# Patient Record
Sex: Female | Born: 1955 | Race: White | Hispanic: No | Marital: Married | Smoking: Never smoker
Health system: Southern US, Community
[De-identification: ages and names within clinical notes are randomized; demographics above are authoritative.]

## PROBLEM LIST (undated history)

## (undated) DIAGNOSIS — Z9889 Other specified postprocedural states: Secondary | ICD-10-CM

## (undated) DIAGNOSIS — K5790 Diverticulosis of intestine, part unspecified, without perforation or abscess without bleeding: Secondary | ICD-10-CM

## (undated) DIAGNOSIS — K219 Gastro-esophageal reflux disease without esophagitis: Secondary | ICD-10-CM

## (undated) DIAGNOSIS — N6019 Diffuse cystic mastopathy of unspecified breast: Secondary | ICD-10-CM

## (undated) DIAGNOSIS — F329 Major depressive disorder, single episode, unspecified: Secondary | ICD-10-CM

## (undated) DIAGNOSIS — K449 Diaphragmatic hernia without obstruction or gangrene: Secondary | ICD-10-CM

## (undated) DIAGNOSIS — G8929 Other chronic pain: Secondary | ICD-10-CM

## (undated) DIAGNOSIS — R109 Unspecified abdominal pain: Secondary | ICD-10-CM

## (undated) DIAGNOSIS — R51 Headache: Secondary | ICD-10-CM

## (undated) DIAGNOSIS — N809 Endometriosis, unspecified: Secondary | ICD-10-CM

## (undated) DIAGNOSIS — F1220 Cannabis dependence, uncomplicated: Secondary | ICD-10-CM

## (undated) DIAGNOSIS — I639 Cerebral infarction, unspecified: Secondary | ICD-10-CM

## (undated) DIAGNOSIS — R1115 Cyclical vomiting syndrome unrelated to migraine: Secondary | ICD-10-CM

## (undated) DIAGNOSIS — K589 Irritable bowel syndrome without diarrhea: Secondary | ICD-10-CM

## (undated) DIAGNOSIS — Z8719 Personal history of other diseases of the digestive system: Secondary | ICD-10-CM

## (undated) DIAGNOSIS — F32A Depression, unspecified: Secondary | ICD-10-CM

## (undated) DIAGNOSIS — F319 Bipolar disorder, unspecified: Secondary | ICD-10-CM

## (undated) DIAGNOSIS — R519 Headache, unspecified: Secondary | ICD-10-CM

## (undated) DIAGNOSIS — K635 Polyp of colon: Secondary | ICD-10-CM

## (undated) DIAGNOSIS — T7840XA Allergy, unspecified, initial encounter: Secondary | ICD-10-CM

## (undated) HISTORY — DX: Headache: R51

## (undated) HISTORY — PX: TOE SURGERY: SHX1073

## (undated) HISTORY — DX: Diffuse cystic mastopathy of unspecified breast: N60.19

## (undated) HISTORY — DX: Diverticulosis of intestine, part unspecified, without perforation or abscess without bleeding: K57.90

## (undated) HISTORY — DX: Polyp of colon: K63.5

## (undated) HISTORY — DX: Other chronic pain: G89.29

## (undated) HISTORY — DX: Allergy, unspecified, initial encounter: T78.40XA

## (undated) HISTORY — DX: Depression, unspecified: F32.A

## (undated) HISTORY — DX: Endometriosis, unspecified: N80.9

## (undated) HISTORY — PX: FOOT FRACTURE SURGERY: SHX645

## (undated) HISTORY — DX: Major depressive disorder, single episode, unspecified: F32.9

## (undated) HISTORY — DX: Cerebral infarction, unspecified: I63.9

## (undated) HISTORY — DX: Other specified postprocedural states: Z98.890

## (undated) HISTORY — PX: HIATAL HERNIA REPAIR: SHX195

## (undated) HISTORY — DX: Headache, unspecified: R51.9

## (undated) HISTORY — DX: Cannabis dependence, uncomplicated: F12.20

## (undated) HISTORY — PX: BREAST LUMPECTOMY: SHX2

## (undated) HISTORY — DX: Personal history of other diseases of the digestive system: Z87.19

---

## 1988-10-31 HISTORY — PX: ABDOMINAL HYSTERECTOMY: SHX81

## 2000-07-12 ENCOUNTER — Encounter: Payer: Self-pay | Admitting: Neurology

## 2000-07-12 ENCOUNTER — Ambulatory Visit (HOSPITAL_COMMUNITY): Admission: RE | Admit: 2000-07-12 | Discharge: 2000-07-12 | Payer: Self-pay | Admitting: Neurology

## 2000-07-26 ENCOUNTER — Encounter: Payer: Self-pay | Admitting: Neurology

## 2000-07-26 ENCOUNTER — Ambulatory Visit (HOSPITAL_COMMUNITY): Admission: RE | Admit: 2000-07-26 | Discharge: 2000-07-26 | Payer: Self-pay | Admitting: Neurology

## 2001-02-26 ENCOUNTER — Other Ambulatory Visit: Admission: RE | Admit: 2001-02-26 | Discharge: 2001-02-26 | Payer: Self-pay | Admitting: *Deleted

## 2004-08-05 ENCOUNTER — Encounter: Admission: RE | Admit: 2004-08-05 | Discharge: 2004-08-05 | Payer: Self-pay | Admitting: *Deleted

## 2004-08-05 ENCOUNTER — Encounter (INDEPENDENT_AMBULATORY_CARE_PROVIDER_SITE_OTHER): Payer: Self-pay | Admitting: Internal Medicine

## 2004-09-02 ENCOUNTER — Ambulatory Visit: Payer: Self-pay | Admitting: Physician Assistant

## 2004-12-30 ENCOUNTER — Ambulatory Visit: Payer: Self-pay | Admitting: Physician Assistant

## 2005-04-26 ENCOUNTER — Ambulatory Visit: Payer: Self-pay | Admitting: Physician Assistant

## 2005-06-27 ENCOUNTER — Emergency Department (HOSPITAL_COMMUNITY): Admission: EM | Admit: 2005-06-27 | Discharge: 2005-06-27 | Payer: Self-pay | Admitting: Emergency Medicine

## 2005-08-04 ENCOUNTER — Ambulatory Visit: Payer: Self-pay | Admitting: Hospitalist

## 2005-08-12 ENCOUNTER — Ambulatory Visit: Payer: Self-pay | Admitting: Internal Medicine

## 2005-08-22 ENCOUNTER — Ambulatory Visit: Payer: Self-pay | Admitting: Physician Assistant

## 2005-08-24 ENCOUNTER — Ambulatory Visit: Payer: Self-pay | Admitting: Infectious Diseases

## 2005-08-24 ENCOUNTER — Inpatient Hospital Stay (HOSPITAL_COMMUNITY): Admission: EM | Admit: 2005-08-24 | Discharge: 2005-08-29 | Payer: Self-pay | Admitting: Emergency Medicine

## 2005-08-26 ENCOUNTER — Encounter (INDEPENDENT_AMBULATORY_CARE_PROVIDER_SITE_OTHER): Payer: Self-pay | Admitting: *Deleted

## 2005-09-15 ENCOUNTER — Encounter (INDEPENDENT_AMBULATORY_CARE_PROVIDER_SITE_OTHER): Payer: Self-pay | Admitting: Internal Medicine

## 2005-09-15 ENCOUNTER — Ambulatory Visit: Payer: Self-pay | Admitting: Internal Medicine

## 2005-09-20 ENCOUNTER — Ambulatory Visit: Payer: Self-pay | Admitting: Physician Assistant

## 2006-01-02 ENCOUNTER — Encounter: Admission: RE | Admit: 2006-01-02 | Discharge: 2006-01-02 | Payer: Self-pay | Admitting: Internal Medicine

## 2006-01-02 ENCOUNTER — Ambulatory Visit: Payer: Self-pay | Admitting: Internal Medicine

## 2006-01-02 ENCOUNTER — Inpatient Hospital Stay (HOSPITAL_COMMUNITY): Admission: AD | Admit: 2006-01-02 | Discharge: 2006-01-05 | Payer: Self-pay | Admitting: Internal Medicine

## 2006-01-18 ENCOUNTER — Ambulatory Visit: Payer: Self-pay | Admitting: Physician Assistant

## 2006-04-30 ENCOUNTER — Inpatient Hospital Stay (HOSPITAL_COMMUNITY): Admission: EM | Admit: 2006-04-30 | Discharge: 2006-05-02 | Payer: Self-pay | Admitting: Emergency Medicine

## 2006-04-30 ENCOUNTER — Ambulatory Visit: Payer: Self-pay | Admitting: Internal Medicine

## 2006-05-15 ENCOUNTER — Ambulatory Visit: Payer: Self-pay | Admitting: Physician Assistant

## 2006-08-10 ENCOUNTER — Ambulatory Visit: Payer: Self-pay | Admitting: Physician Assistant

## 2006-09-05 DIAGNOSIS — F329 Major depressive disorder, single episode, unspecified: Secondary | ICD-10-CM | POA: Insufficient documentation

## 2006-09-05 DIAGNOSIS — G43909 Migraine, unspecified, not intractable, without status migrainosus: Secondary | ICD-10-CM | POA: Insufficient documentation

## 2006-09-05 DIAGNOSIS — Z9079 Acquired absence of other genital organ(s): Secondary | ICD-10-CM | POA: Insufficient documentation

## 2006-09-05 DIAGNOSIS — F3289 Other specified depressive episodes: Secondary | ICD-10-CM | POA: Insufficient documentation

## 2006-09-05 DIAGNOSIS — K589 Irritable bowel syndrome without diarrhea: Secondary | ICD-10-CM | POA: Insufficient documentation

## 2006-09-05 DIAGNOSIS — M542 Cervicalgia: Secondary | ICD-10-CM | POA: Insufficient documentation

## 2006-11-13 ENCOUNTER — Ambulatory Visit: Payer: Self-pay | Admitting: Pain Medicine

## 2006-11-15 DIAGNOSIS — N809 Endometriosis, unspecified: Secondary | ICD-10-CM | POA: Insufficient documentation

## 2006-11-15 DIAGNOSIS — F1420 Cocaine dependence, uncomplicated: Secondary | ICD-10-CM | POA: Insufficient documentation

## 2006-11-15 DIAGNOSIS — K920 Hematemesis: Secondary | ICD-10-CM | POA: Insufficient documentation

## 2006-12-03 ENCOUNTER — Emergency Department (HOSPITAL_COMMUNITY): Admission: EM | Admit: 2006-12-03 | Discharge: 2006-12-03 | Payer: Self-pay | Admitting: Emergency Medicine

## 2006-12-05 ENCOUNTER — Telehealth (INDEPENDENT_AMBULATORY_CARE_PROVIDER_SITE_OTHER): Payer: Self-pay | Admitting: *Deleted

## 2007-02-02 ENCOUNTER — Telehealth: Payer: Self-pay | Admitting: *Deleted

## 2007-02-05 ENCOUNTER — Telehealth: Payer: Self-pay | Admitting: *Deleted

## 2007-02-07 ENCOUNTER — Ambulatory Visit: Payer: Self-pay | Admitting: Physician Assistant

## 2007-02-21 ENCOUNTER — Emergency Department (HOSPITAL_COMMUNITY): Admission: EM | Admit: 2007-02-21 | Discharge: 2007-02-21 | Payer: Self-pay | Admitting: Emergency Medicine

## 2007-02-22 ENCOUNTER — Emergency Department (HOSPITAL_COMMUNITY): Admission: EM | Admit: 2007-02-22 | Discharge: 2007-02-22 | Payer: Self-pay | Admitting: Emergency Medicine

## 2007-05-15 ENCOUNTER — Ambulatory Visit: Payer: Self-pay | Admitting: Physician Assistant

## 2007-05-18 ENCOUNTER — Emergency Department (HOSPITAL_COMMUNITY): Admission: EM | Admit: 2007-05-18 | Discharge: 2007-05-18 | Payer: Self-pay | Admitting: Emergency Medicine

## 2007-08-20 ENCOUNTER — Emergency Department (HOSPITAL_COMMUNITY): Admission: EM | Admit: 2007-08-20 | Discharge: 2007-08-20 | Payer: Self-pay | Admitting: Emergency Medicine

## 2007-09-03 ENCOUNTER — Inpatient Hospital Stay (HOSPITAL_COMMUNITY): Admission: AD | Admit: 2007-09-03 | Discharge: 2007-09-10 | Payer: Self-pay | Admitting: Psychiatry

## 2007-09-03 ENCOUNTER — Encounter: Payer: Self-pay | Admitting: Emergency Medicine

## 2007-09-03 ENCOUNTER — Ambulatory Visit: Payer: Self-pay | Admitting: Psychiatry

## 2008-12-19 ENCOUNTER — Emergency Department (HOSPITAL_COMMUNITY): Admission: EM | Admit: 2008-12-19 | Discharge: 2008-12-19 | Payer: Self-pay | Admitting: Emergency Medicine

## 2009-07-07 ENCOUNTER — Inpatient Hospital Stay (HOSPITAL_COMMUNITY): Admission: EM | Admit: 2009-07-07 | Discharge: 2009-07-09 | Payer: Self-pay | Admitting: Emergency Medicine

## 2009-08-02 ENCOUNTER — Ambulatory Visit: Payer: Self-pay | Admitting: Infectious Diseases

## 2009-12-10 ENCOUNTER — Ambulatory Visit: Payer: Self-pay | Admitting: Internal Medicine

## 2009-12-10 ENCOUNTER — Inpatient Hospital Stay (HOSPITAL_COMMUNITY): Admission: EM | Admit: 2009-12-10 | Discharge: 2009-12-14 | Payer: Self-pay | Admitting: Emergency Medicine

## 2009-12-10 ENCOUNTER — Encounter: Payer: Self-pay | Admitting: Internal Medicine

## 2010-02-13 ENCOUNTER — Emergency Department (HOSPITAL_COMMUNITY): Admission: EM | Admit: 2010-02-13 | Discharge: 2010-02-13 | Payer: Self-pay | Admitting: Emergency Medicine

## 2010-03-25 ENCOUNTER — Observation Stay (HOSPITAL_COMMUNITY): Admission: EM | Admit: 2010-03-25 | Discharge: 2010-03-25 | Payer: Self-pay | Admitting: Emergency Medicine

## 2010-04-10 ENCOUNTER — Emergency Department (HOSPITAL_COMMUNITY): Admission: EM | Admit: 2010-04-10 | Discharge: 2010-04-10 | Payer: Self-pay | Admitting: Emergency Medicine

## 2010-05-23 ENCOUNTER — Observation Stay (HOSPITAL_COMMUNITY): Admission: EM | Admit: 2010-05-23 | Discharge: 2010-05-23 | Payer: Self-pay | Admitting: Emergency Medicine

## 2010-05-24 ENCOUNTER — Inpatient Hospital Stay (HOSPITAL_COMMUNITY): Admission: EM | Admit: 2010-05-24 | Discharge: 2010-05-25 | Payer: Self-pay | Admitting: Emergency Medicine

## 2010-05-24 ENCOUNTER — Ambulatory Visit: Payer: Self-pay | Admitting: Family Medicine

## 2010-07-27 ENCOUNTER — Ambulatory Visit: Payer: Self-pay | Admitting: Family Medicine

## 2010-07-27 ENCOUNTER — Inpatient Hospital Stay (HOSPITAL_COMMUNITY): Admission: EM | Admit: 2010-07-27 | Discharge: 2010-07-28 | Payer: Self-pay | Admitting: Emergency Medicine

## 2010-11-30 NOTE — Miscellaneous (Signed)
Summary: Hospital admission  INTERNAL MEDICINE ADMISSION HISTORY AND PHYSICAL  Attending: Dr. Ulyess Mort  First contact: Sherrie Mustache, MS4 [phone]   Second contact: Dr. Threasa Beards [phone] (Weekends, after-hours: [phone], [phone])   PCP: NON-PCP, will get from [facility] in [place]  UU:VOZDGU/YQIHKVQQ/VZDGLOVF and abdominal pain.  HPI: Patient is a 55 year old female with PMH of IBS, bipolar disorder and DM who presents with a chief complaint of Nausea/vomiting/diarrhea with abdominal pain. The history was provided by the patient. She reported that she started to have diarrhea this morning about 6 AM, followed by nausea, vomiting and abdominal pain. Her abd pain was similar to previous diverticulitis. But denies any fever or chills. She has about 6 episodes of watery diarrhea, no blood, also has about 12 episodes of vomiting with food, no hemetemesis. Patient states that the vomitus and diarrhea appear similar consisting of a yellow bile color with foam.  The abdominal pain is characterized as cramping, sharp and stabbing, constant, diffuse, worst about 9/10 in severity, no radiation. Nothing makes it change. So she came to ED for further evaluation. She denies recent travel, no unusual diet. Denies dysuria or frequency. She has sore throat, running nose 2-3 days ago and her husband also has these symptoms, but no diarrhea or N/V, has Hx of sick contacts recently with her step-daughter and grandchild. The patient has a history of similar symptoms previously, and was thought due to diverticulitis and irritable bowel syndrome.   ALLERGIES: NKDA  PAST MEDICAL HISTORY: Diabetes for 14 years on diet control Hx of diverticulitis Irritable bowel syndrome Bipolar disorder.  Migraine headaches.  S/P hysterectomy and bilateral lumpectomy with benign lesion.  MEDICATIONS: Depakote 1000 mg, at bedtime perphenazine 4mg , at bedtime Lamictal 100, at bedtime Vicodin 5/500, 1 Tab, q6h as needed for  pain Trazodone 150 mg, by mouth, at bedtime    SOCIAL HISTORY: Marries and lives with husband. Disability. She has 3 sons, all healthy. She denies smoking, ETOH, but admits marijuna use.   FAMILY HISTORY: Mother died of brain cancer at age of 71. Father died of car accident at age of 12. Her brother committed suicide at age of 71, and 2 sisters have HTN and DM.   ROS: See HPI.  VITALS: T:97.7     BP: 174/91  P: 58   R: 22    O2SAT:96%  ON:RA  PHYSICAL EXAM: GEN: NAD HEAD: NCAT  EYES: PERRL, EOMI, no icterus, no pallor NECK: supple, no JVD, no cervical LN LUNGS: clear to auscultation bilaterally, No R/C/W  CVS: RRR, no murmur/rubs/gallops ABD: Soft, ND. Bilateral lower quadrant/suprapubic tenderness. Hypoactive bowel sounds.  EXTREMITIES: No edema or cyanosis NEURO: Alert &Oriented x3, no focal motor or sensory deficits.    LABS:  CBC+Diff  WBC                                      19.3       h      4.0-10.5         K/uL  RBC                                      4.34              3.87-5.11        MIL/uL  Hemoglobin (  HGB)                         14.8              12.0-15.0        g/dL  Hematocrit (HCT)                         42.7              36.0-46.0        %  MCV                                      98.4              78.0-100.0       fL  MCHC                                     34.6              30.0-36.0        g/dL  RDW                                      13.5              11.5-15.5        %  Platelet Count (PLT)                     322               150-400          K/uL  Neutrophils, %                           82         h      43-77            %  Lymphocytes, %                           16                12-46            %  Monocytes, %                             2          l      3-12             %  Eosinophils, %                           0                 0-5              %  Basophils, %  0                 0-1              %   Neutrophils, Absolute                    15.8       h      1.7-7.7          K/uL  Lymphocytes, Absolute                    3.1               0.7-4.0          K/uL  Monocytes, Absolute                      0.3               0.1-1.0          K/uL  Eosinophils, Absolute                    0.0               0.0-0.7          K/uL  Basophils, Absolute                      0.0               0.0-0.1          K/uL  CMET  Sodium (NA)                              141               135-145          mEq/L  Potassium (K)                            3.5               3.5-5.1          mEq/L  Chloride                                 107               96-112           mEq/L  CO2                                      24                19-32            mEq/L  Glucose                                  142        h      70-99            mg/dL  BUN  9                 6-23             mg/dL  Creatinine                               0.76              0.4-1.2          mg/dL  GFR, Est Non African American            >60               >60              mL/min  GFR, Est African American                >60               >60              mL/min    Oversized comment, see footnote  1  Bilirubin, Total                         0.3               0.3-1.2          mg/dL  Alkaline Phosphatase                     103               39-117           U/L  SGOT (AST)                               24                0-37             U/L  SGPT (ALT)                               15                0-35             U/L  Total  Protein                           7.1               6.0-8.3          g/dL  Albumin-Blood                            4.1               3.5-5.2          g/dL  Calcium                                  9.1               8.4-10.5         mg/dL   Lipase  15                11-59            U/L   UA  Color, Urine                               YELLOW             YELLOW  Appearance                               TURBID     a      CLEAR  Specific Gravity                         1.012             1.005-1.030  pH                                            7.5               5.0-8.0  Urine Glucose                            NEGATIVE          NEG              mg/dL  Bilirubin                                NEGATIVE          NEG  Ketones                                  NEGATIVE          NEG              mg/dL  Blood                                    SMALL      a      NEG  Protein                                  NEGATIVE          NEG              mg/dL  Urobilinogen                             0.2               0.0-1.0          mg/dL  Nitrite                                    NEGATIVE          NEG  Leukocytes  NEGATIVE          NEG  WBC / HPF                                0-2               <3               WBC/hpf  RBC / HPF                                3-6               <3               RBC/hpf  Bacteria / HPF                           FEW        a      RARE  Urine-Other                              SEE NOTE.    AMORPHOUS PHOSPHATES  CT ABDOMEN AND PELVIS WITH CONTRAST    Technique:  Multidetector CT imaging of the abdomen and pelvis was   performed following the standard protocol during bolus   administration of intravenous contrast.    Contrast: 100 ml Omnipaque-300 IV.    Comparison: CT 06/27/2005    Findings: There is a moderately  large para-esophageal hiatal   hernia.  There are a few small hepatic cysts as noted previously.   No biliary ductal dilatation.  Spleen, pancreas, kidneys, and   adrenal glands appear unremarkable.    Mild diffuse wall thickening of the colon.  Paracolic mesenteric   branches (vasa recta) appear prominent/injected.  Combination   findings would be compatible with colitis.  No small bowel wall   thickening.    No free fluid.  No pathologically enlarged lymph nodes.  There  are   a few scattered diverticula.  Negative for diverticulitis.   Gallbladder unremarkable.  Bony structures appear normal.    IMPRESSION:   Findings consistent with diffuse colitis.   ASSESSMENT AND PLAN:  1. N/V/D with abdominal pain: She has acute diarrhea with N/V and abdominal pain, recent sick contacts, benigh abdominal exam. CT shows mild diffuse colitis. This is ;likely due to viral gastroenteritis. Other possible DD includes C. Diff colitis, bacterial colitis, diverticulitis or IBS. --- Will give fluid rehydration with IV NS at 125 cc/hr and give clear liquid diet if tolerates. --- Check Stool C. Diff, stool Culture and O&P. --- check TSH and HIV and UDS --- Will give ABX with cipro and flagyl by mouth  --- symptom control with dilaudid and phenergan.  2. Bipolar disorder: No SI/HI, has been well controlled with home meds and will continue these.  3. DM: She has been diet control. will check A1C.  4. Drug abuse: Provided conseling. Check UDS.  5. Prophylaxsis: Lovenox and protonix.     Bailey Mech, MS4 [phone]_______________  Jackson Latino, PGY-2 [phone]_________________   ATTENDING: I performed and/or observed a history and physical examination of the patient.  I discussed the case with the residents as noted and reviewed the residents' notes.  I agree with the findings and plan--please refer  to the attending physician note for more details.  Signature________________________________  Printed Name_____________________________

## 2010-12-26 ENCOUNTER — Emergency Department (HOSPITAL_COMMUNITY)
Admission: EM | Admit: 2010-12-26 | Discharge: 2010-12-26 | Disposition: A | Discharge status: Home / Self Care | Payer: Medicare Other | Attending: Emergency Medicine | Admitting: Emergency Medicine

## 2010-12-26 DIAGNOSIS — R112 Nausea with vomiting, unspecified: Secondary | ICD-10-CM | POA: Insufficient documentation

## 2010-12-26 DIAGNOSIS — E119 Type 2 diabetes mellitus without complications: Secondary | ICD-10-CM | POA: Insufficient documentation

## 2010-12-26 DIAGNOSIS — R1013 Epigastric pain: Secondary | ICD-10-CM | POA: Insufficient documentation

## 2010-12-26 DIAGNOSIS — K589 Irritable bowel syndrome without diarrhea: Secondary | ICD-10-CM | POA: Insufficient documentation

## 2010-12-26 LAB — CBC
HCT: 43.1 % (ref 36.0–46.0)
Hemoglobin: 14.8 g/dL (ref 12.0–15.0)
MCH: 32.1 pg (ref 26.0–34.0)
MCHC: 34.3 g/dL (ref 30.0–36.0)
Platelets: 344 10*3/uL (ref 150–400)
WBC: 16.4 10*3/uL — ABNORMAL HIGH (ref 4.0–10.5)

## 2010-12-26 LAB — DIFFERENTIAL
Basophils Absolute: 0.1 10*3/uL (ref 0.0–0.1)
Basophils Relative: 0 % (ref 0–1)
Eosinophils Absolute: 0.1 10*3/uL (ref 0.0–0.7)
Eosinophils Relative: 0 % (ref 0–5)
Lymphocytes Relative: 17 % (ref 12–46)
Lymphs Abs: 2.8 10*3/uL (ref 0.7–4.0)
Monocytes Absolute: 0.6 10*3/uL (ref 0.1–1.0)
Monocytes Relative: 4 % (ref 3–12)
Neutro Abs: 12.9 10*3/uL — ABNORMAL HIGH (ref 1.7–7.7)

## 2010-12-26 LAB — URINALYSIS, ROUTINE W REFLEX MICROSCOPIC
Leukocytes, UA: NEGATIVE
Protein, ur: NEGATIVE mg/dL
Specific Gravity, Urine: 1.017 (ref 1.005–1.030)
Urine Glucose, Fasting: NEGATIVE mg/dL
Urobilinogen, UA: 0.2 mg/dL (ref 0.0–1.0)

## 2010-12-26 LAB — COMPREHENSIVE METABOLIC PANEL
AST: 24 U/L (ref 0–37)
Alkaline Phosphatase: 93 U/L (ref 39–117)
BUN: 7 mg/dL (ref 6–23)
CO2: 22 mEq/L (ref 19–32)
Calcium: 9 mg/dL (ref 8.4–10.5)
Creatinine, Ser: 0.63 mg/dL (ref 0.4–1.2)
GFR calc Af Amer: >60 mL/min (ref >60)
GFR calc non Af Amer: >60 mL/min (ref >60)
Glucose, Bld: 134 mg/dL — ABNORMAL HIGH (ref 70–99)
Potassium: 3.3 mEq/L — ABNORMAL LOW (ref 3.5–5.1)
Sodium: 135 mEq/L (ref 135–145)
Total Bilirubin: 0.2 mg/dL — ABNORMAL LOW (ref 0.3–1.2)
Total Protein: 6.6 g/dL (ref 6.0–8.3)

## 2010-12-26 LAB — RAPID URINE DRUG SCREEN, HOSP PERFORMED
Barbiturates: NOT DETECTED
Benzodiazepines: NOT DETECTED

## 2010-12-26 LAB — URINE MICROSCOPIC-ADD ON

## 2010-12-26 LAB — LIPASE, BLOOD: Lipase: 19 U/L (ref 11–59)

## 2010-12-27 LAB — URINE CULTURE

## 2011-01-08 ENCOUNTER — Emergency Department (HOSPITAL_COMMUNITY)
Admission: EM | Admit: 2011-01-08 | Discharge: 2011-01-08 | Disposition: A | Discharge status: Home / Self Care | Payer: Medicare Other | Attending: Emergency Medicine | Admitting: Emergency Medicine

## 2011-01-08 DIAGNOSIS — R1013 Epigastric pain: Secondary | ICD-10-CM | POA: Insufficient documentation

## 2011-01-08 DIAGNOSIS — F319 Bipolar disorder, unspecified: Secondary | ICD-10-CM | POA: Insufficient documentation

## 2011-01-08 DIAGNOSIS — R1032 Left lower quadrant pain: Secondary | ICD-10-CM | POA: Insufficient documentation

## 2011-01-08 DIAGNOSIS — K589 Irritable bowel syndrome without diarrhea: Secondary | ICD-10-CM | POA: Insufficient documentation

## 2011-01-08 DIAGNOSIS — R197 Diarrhea, unspecified: Secondary | ICD-10-CM | POA: Insufficient documentation

## 2011-01-08 DIAGNOSIS — K2970 Gastritis, unspecified, without bleeding: Secondary | ICD-10-CM | POA: Insufficient documentation

## 2011-01-08 DIAGNOSIS — R112 Nausea with vomiting, unspecified: Secondary | ICD-10-CM | POA: Insufficient documentation

## 2011-01-08 LAB — COMPREHENSIVE METABOLIC PANEL
Albumin: 3.4 g/dL — ABNORMAL LOW (ref 3.5–5.2)
BUN: 8 mg/dL (ref 6–23)
Chloride: 112 mEq/L (ref 96–112)
Creatinine, Ser: 0.67 mg/dL (ref 0.4–1.2)
Total Bilirubin: 0.3 mg/dL (ref 0.3–1.2)
Total Protein: 6.2 g/dL (ref 6.0–8.3)

## 2011-01-08 LAB — CBC
MCH: 32.3 pg (ref 26.0–34.0)
MCHC: 34 g/dL (ref 30.0–36.0)
MCV: 95 fL (ref 78.0–100.0)
Platelets: 326 10*3/uL (ref 150–400)
RDW: 13.9 % (ref 11.5–15.5)

## 2011-01-08 LAB — DIFFERENTIAL
Eosinophils Absolute: 0.1 10*3/uL (ref 0.0–0.7)
Eosinophils Relative: 1 % (ref 0–5)
Lymphs Abs: 2.8 10*3/uL (ref 0.7–4.0)
Monocytes Absolute: 0.7 10*3/uL (ref 0.1–1.0)
Monocytes Relative: 5 % (ref 3–12)

## 2011-01-08 LAB — URINALYSIS, ROUTINE W REFLEX MICROSCOPIC
Glucose, UA: NEGATIVE mg/dL
Leukocytes, UA: NEGATIVE
Nitrite: NEGATIVE
Specific Gravity, Urine: 1.027 (ref 1.005–1.030)
pH: 6.5 (ref 5.0–8.0)

## 2011-01-08 LAB — URINE MICROSCOPIC-ADD ON

## 2011-01-10 ENCOUNTER — Emergency Department (HOSPITAL_COMMUNITY): Payer: Medicare Other

## 2011-01-10 ENCOUNTER — Emergency Department (HOSPITAL_COMMUNITY)
Admission: EM | Admit: 2011-01-10 | Discharge: 2011-01-10 | Disposition: A | Discharge status: Home / Self Care | Payer: Medicare Other | Attending: Emergency Medicine | Admitting: Emergency Medicine

## 2011-01-10 DIAGNOSIS — R197 Diarrhea, unspecified: Secondary | ICD-10-CM | POA: Insufficient documentation

## 2011-01-10 DIAGNOSIS — F3130 Bipolar disorder, current episode depressed, mild or moderate severity, unspecified: Secondary | ICD-10-CM | POA: Insufficient documentation

## 2011-01-10 DIAGNOSIS — R109 Unspecified abdominal pain: Secondary | ICD-10-CM | POA: Insufficient documentation

## 2011-01-10 DIAGNOSIS — G8929 Other chronic pain: Secondary | ICD-10-CM | POA: Insufficient documentation

## 2011-01-10 DIAGNOSIS — K589 Irritable bowel syndrome without diarrhea: Secondary | ICD-10-CM | POA: Insufficient documentation

## 2011-01-10 DIAGNOSIS — Z79899 Other long term (current) drug therapy: Secondary | ICD-10-CM | POA: Insufficient documentation

## 2011-01-10 DIAGNOSIS — R112 Nausea with vomiting, unspecified: Secondary | ICD-10-CM | POA: Insufficient documentation

## 2011-01-10 DIAGNOSIS — M549 Dorsalgia, unspecified: Secondary | ICD-10-CM | POA: Insufficient documentation

## 2011-01-10 LAB — URINALYSIS, ROUTINE W REFLEX MICROSCOPIC
Bilirubin Urine: NEGATIVE
Protein, ur: NEGATIVE mg/dL
Urobilinogen, UA: 0.2 mg/dL (ref 0.0–1.0)

## 2011-01-10 LAB — COMPREHENSIVE METABOLIC PANEL
ALT: 14 U/L (ref 0–35)
AST: 25 U/L (ref 0–37)
Alkaline Phosphatase: 103 U/L (ref 39–117)
CO2: 24 mEq/L (ref 19–32)
Chloride: 104 mEq/L (ref 96–112)
GFR calc Af Amer: >60 mL/min (ref >60)
GFR calc non Af Amer: >60 mL/min (ref >60)
Potassium: 3.4 mEq/L — ABNORMAL LOW (ref 3.5–5.1)
Sodium: 138 mEq/L (ref 135–145)
Total Bilirubin: 0.3 mg/dL (ref 0.3–1.2)

## 2011-01-10 LAB — URINE MICROSCOPIC-ADD ON

## 2011-01-10 LAB — CBC
Hemoglobin: 14.8 g/dL (ref 12.0–15.0)
MCH: 33.2 pg (ref 26.0–34.0)
RBC: 4.46 MIL/uL (ref 3.87–5.11)

## 2011-01-10 LAB — DIFFERENTIAL
Basophils Relative: 0 % (ref 0–1)
Monocytes Relative: 4 % (ref 3–12)
Neutro Abs: 9.9 10*3/uL — ABNORMAL HIGH (ref 1.7–7.7)
Neutrophils Relative %: 76 % (ref 43–77)

## 2011-01-11 LAB — URINE CULTURE

## 2011-01-13 LAB — DIFFERENTIAL
Basophils Absolute: 0 10*3/uL (ref 0.0–0.1)
Basophils Absolute: 0.1 10*3/uL (ref 0.0–0.1)
Basophils Relative: 0 % (ref 0–1)
Eosinophils Relative: 0 % (ref 0–5)
Eosinophils Relative: 2 % (ref 0–5)
Lymphocytes Relative: 15 % (ref 12–46)
Lymphocytes Relative: 21 % (ref 12–46)
Lymphocytes Relative: 24 % (ref 12–46)
Lymphs Abs: 2.8 10*3/uL (ref 0.7–4.0)
Lymphs Abs: 4.6 10*3/uL — ABNORMAL HIGH (ref 0.7–4.0)
Monocytes Absolute: 1 10*3/uL (ref 0.1–1.0)
Neutro Abs: 12.7 10*3/uL — ABNORMAL HIGH (ref 1.7–7.7)
Neutro Abs: 13.4 10*3/uL — ABNORMAL HIGH (ref 1.7–7.7)
Neutrophils Relative %: 73 % (ref 43–77)

## 2011-01-13 LAB — CBC
HCT: 40.4 % (ref 36.0–46.0)
Hemoglobin: 15.4 g/dL — ABNORMAL HIGH (ref 12.0–15.0)
MCH: 33.9 pg (ref 26.0–34.0)
MCH: 34.6 pg — ABNORMAL HIGH (ref 26.0–34.0)
MCHC: 34.8 g/dL (ref 30.0–36.0)
MCV: 97.3 fL (ref 78.0–100.0)
MCV: 98.6 fL (ref 78.0–100.0)
MCV: 99.3 fL (ref 78.0–100.0)
Platelets: 281 10*3/uL (ref 150–400)
Platelets: 317 10*3/uL (ref 150–400)
RBC: 4.15 MIL/uL (ref 3.87–5.11)
RBC: 4.16 MIL/uL (ref 3.87–5.11)
WBC: 18.4 10*3/uL — ABNORMAL HIGH (ref 4.0–10.5)

## 2011-01-13 LAB — HEMOCCULT GUIAC POC 1CARD (OFFICE): Fecal Occult Bld: POSITIVE

## 2011-01-13 LAB — BASIC METABOLIC PANEL
CO2: 25 mEq/L (ref 19–32)
Chloride: 105 mEq/L (ref 96–112)
Creatinine, Ser: 0.59 mg/dL (ref 0.4–1.2)
GFR calc Af Amer: >60 mL/min (ref >60)

## 2011-01-13 LAB — COMPREHENSIVE METABOLIC PANEL
AST: 25 U/L (ref 0–37)
BUN: 12 mg/dL (ref 6–23)
CO2: 22 mEq/L (ref 19–32)
Calcium: 9 mg/dL (ref 8.4–10.5)
Creatinine, Ser: 0.71 mg/dL (ref 0.4–1.2)
GFR calc Af Amer: >60 mL/min (ref >60)
GFR calc non Af Amer: >60 mL/min (ref >60)

## 2011-01-13 LAB — URINALYSIS, ROUTINE W REFLEX MICROSCOPIC
Ketones, ur: NEGATIVE mg/dL
Nitrite: NEGATIVE
Protein, ur: NEGATIVE mg/dL
Urobilinogen, UA: 0.2 mg/dL (ref 0.0–1.0)

## 2011-01-13 LAB — LIPASE, BLOOD: Lipase: 24 U/L (ref 11–59)

## 2011-01-13 LAB — LACTIC ACID, PLASMA: Lactic Acid, Venous: 1.6 mmol/L (ref 0.5–2.2)

## 2011-01-15 LAB — DIFFERENTIAL
Basophils Absolute: 0.1 10*3/uL (ref 0.0–0.1)
Basophils Relative: 0 % (ref 0–1)
Eosinophils Absolute: 0 10*3/uL (ref 0.0–0.7)
Eosinophils Relative: 0 % (ref 0–5)
Eosinophils Relative: 0 % (ref 0–5)
Lymphocytes Relative: 12 % (ref 12–46)
Lymphocytes Relative: 14 % (ref 12–46)
Lymphocytes Relative: 23 % (ref 12–46)
Lymphs Abs: 1.8 10*3/uL (ref 0.7–4.0)
Lymphs Abs: 3.7 10*3/uL (ref 0.7–4.0)
Monocytes Absolute: 0.2 10*3/uL (ref 0.1–1.0)
Monocytes Absolute: 0.4 10*3/uL (ref 0.1–1.0)
Monocytes Relative: 3 % (ref 3–12)
Neutro Abs: 11.6 10*3/uL — ABNORMAL HIGH (ref 1.7–7.7)
Neutrophils Relative %: 73 % (ref 43–77)
Neutrophils Relative %: 84 % — ABNORMAL HIGH (ref 43–77)

## 2011-01-15 LAB — COMPREHENSIVE METABOLIC PANEL
ALT: 13 U/L (ref 0–35)
AST: 23 U/L (ref 0–37)
AST: 24 U/L (ref 0–37)
Albumin: 4.3 g/dL (ref 3.5–5.2)
Alkaline Phosphatase: 79 U/L (ref 39–117)
CO2: 25 mEq/L (ref 19–32)
CO2: 27 mEq/L (ref 19–32)
Calcium: 8.6 mg/dL (ref 8.4–10.5)
Calcium: 9.2 mg/dL (ref 8.4–10.5)
Chloride: 102 mEq/L (ref 96–112)
Chloride: 108 mEq/L (ref 96–112)
Creatinine, Ser: 0.69 mg/dL (ref 0.4–1.2)
Creatinine, Ser: 0.79 mg/dL (ref 0.4–1.2)
GFR calc Af Amer: >60 mL/min (ref >60)
GFR calc Af Amer: >60 mL/min (ref >60)
GFR calc non Af Amer: >60 mL/min (ref >60)
GFR calc non Af Amer: >60 mL/min (ref >60)
GFR calc non Af Amer: >60 mL/min (ref >60)
Glucose, Bld: 109 mg/dL — ABNORMAL HIGH (ref 70–99)
Glucose, Bld: 130 mg/dL — ABNORMAL HIGH (ref 70–99)
Sodium: 137 mEq/L (ref 135–145)
Total Bilirubin: 0.3 mg/dL (ref 0.3–1.2)
Total Bilirubin: 0.8 mg/dL (ref 0.3–1.2)

## 2011-01-15 LAB — CBC
HCT: 40.9 % (ref 36.0–46.0)
HCT: 43.6 % (ref 36.0–46.0)
HCT: 43.7 % (ref 36.0–46.0)
Hemoglobin: 14 g/dL (ref 12.0–15.0)
Hemoglobin: 14.9 g/dL (ref 12.0–15.0)
Hemoglobin: 15.2 g/dL — ABNORMAL HIGH (ref 12.0–15.0)
MCH: 34.5 pg — ABNORMAL HIGH (ref 26.0–34.0)
MCH: 35 pg — ABNORMAL HIGH (ref 26.0–34.0)
MCHC: 34.3 g/dL (ref 30.0–36.0)
MCHC: 34.9 g/dL (ref 30.0–36.0)
MCV: 101.5 fL — ABNORMAL HIGH (ref 78.0–100.0)
Platelets: 269 10*3/uL (ref 150–400)
RBC: 4.3 MIL/uL (ref 3.87–5.11)
RBC: 4.34 MIL/uL (ref 3.87–5.11)
WBC: 15.8 10*3/uL — ABNORMAL HIGH (ref 4.0–10.5)

## 2011-01-15 LAB — URINALYSIS, ROUTINE W REFLEX MICROSCOPIC
Bilirubin Urine: NEGATIVE
Ketones, ur: NEGATIVE mg/dL
Nitrite: NEGATIVE
Specific Gravity, Urine: 1.014 (ref 1.005–1.030)
Urobilinogen, UA: 0.2 mg/dL (ref 0.0–1.0)

## 2011-01-15 LAB — LIPASE, BLOOD
Lipase: 19 U/L (ref 11–59)
Lipase: 20 U/L (ref 11–59)

## 2011-01-15 LAB — BASIC METABOLIC PANEL
BUN: 4 mg/dL — ABNORMAL LOW (ref 6–23)
Calcium: 8.8 mg/dL (ref 8.4–10.5)
Creatinine, Ser: 0.62 mg/dL (ref 0.4–1.2)
GFR calc non Af Amer: >60 mL/min (ref >60)
Glucose, Bld: 102 mg/dL — ABNORMAL HIGH (ref 70–99)
Sodium: 139 mEq/L (ref 135–145)

## 2011-01-15 LAB — HEMOGLOBIN A1C: Hgb A1c MFr Bld: 5.3 % (ref <5.7)

## 2011-01-17 LAB — URINE CULTURE

## 2011-01-17 LAB — POCT PREGNANCY, URINE: Preg Test, Ur: NEGATIVE

## 2011-01-17 LAB — CBC
HCT: 47.2 % — ABNORMAL HIGH (ref 36.0–46.0)
HCT: 45.4 % (ref 36.0–46.0)
Hemoglobin: 16.4 g/dL — ABNORMAL HIGH (ref 12.0–15.0)
Hemoglobin: 15.7 g/dL — ABNORMAL HIGH (ref 12.0–15.0)
MCHC: 34.8 g/dL (ref 30.0–36.0)
MCV: 101.2 fL — ABNORMAL HIGH (ref 78.0–100.0)
Platelets: 314 10*3/uL (ref 150–400)
RBC: 4.53 MIL/uL (ref 3.87–5.11)
RDW: 13.1 % (ref 11.5–15.5)
RDW: 13.4 % (ref 11.5–15.5)

## 2011-01-17 LAB — URINALYSIS, ROUTINE W REFLEX MICROSCOPIC
Bilirubin Urine: NEGATIVE
Leukocytes, UA: NEGATIVE
Nitrite: NEGATIVE
Nitrite: NEGATIVE
Specific Gravity, Urine: 1.016 (ref 1.005–1.030)
Specific Gravity, Urine: 1.012 (ref 1.005–1.030)
pH: 7 (ref 5.0–8.0)
pH: 7.5 (ref 5.0–8.0)

## 2011-01-17 LAB — COMPREHENSIVE METABOLIC PANEL
ALT: 11 U/L (ref 0–35)
Albumin: 4.4 g/dL (ref 3.5–5.2)
Alkaline Phosphatase: 86 U/L (ref 39–117)
BUN: 12 mg/dL (ref 6–23)
BUN: 12 mg/dL (ref 6–23)
CO2: 24 mEq/L (ref 19–32)
Chloride: 108 mEq/L (ref 96–112)
Creatinine, Ser: 0.74 mg/dL (ref 0.4–1.2)
GFR calc non Af Amer: >60 mL/min (ref >60)
Glucose, Bld: 118 mg/dL — ABNORMAL HIGH (ref 70–99)
Glucose, Bld: 125 mg/dL — ABNORMAL HIGH (ref 70–99)
Potassium: 3.6 mEq/L (ref 3.5–5.1)
Sodium: 141 mEq/L (ref 135–145)
Total Bilirubin: 0.5 mg/dL (ref 0.3–1.2)
Total Bilirubin: 0.4 mg/dL (ref 0.3–1.2)
Total Protein: 7.6 g/dL (ref 6.0–8.3)
Total Protein: 7.3 g/dL (ref 6.0–8.3)

## 2011-01-17 LAB — DIFFERENTIAL
Basophils Absolute: 0 10*3/uL (ref 0.0–0.1)
Basophils Relative: 0 % (ref 0–1)
Eosinophils Absolute: 0.1 10*3/uL (ref 0.0–0.7)
Lymphocytes Relative: 26 % (ref 12–46)
Lymphs Abs: 4.1 10*3/uL — ABNORMAL HIGH (ref 0.7–4.0)
Monocytes Relative: 3 % (ref 3–12)
Neutro Abs: 10.7 10*3/uL — ABNORMAL HIGH (ref 1.7–7.7)
Neutro Abs: 10.2 10*3/uL — ABNORMAL HIGH (ref 1.7–7.7)
Neutrophils Relative %: 69 % (ref 43–77)
Neutrophils Relative %: 76 % (ref 43–77)

## 2011-01-17 LAB — URINE MICROSCOPIC-ADD ON

## 2011-01-18 LAB — COMPREHENSIVE METABOLIC PANEL
ALT: 10 U/L (ref 0–35)
AST: 22 U/L (ref 0–37)
Albumin: 4.2 g/dL (ref 3.5–5.2)
CO2: 22 mEq/L (ref 19–32)
Calcium: 9.1 mg/dL (ref 8.4–10.5)
Chloride: 112 mEq/L (ref 96–112)
Creatinine, Ser: 0.68 mg/dL (ref 0.4–1.2)
GFR calc Af Amer: >60 mL/min (ref >60)
GFR calc non Af Amer: >60 mL/min (ref >60)
Sodium: 143 mEq/L (ref 135–145)
Total Bilirubin: 0.6 mg/dL (ref 0.3–1.2)

## 2011-01-18 LAB — URINALYSIS, ROUTINE W REFLEX MICROSCOPIC
Bilirubin Urine: NEGATIVE
Ketones, ur: NEGATIVE mg/dL
Specific Gravity, Urine: 1.013 (ref 1.005–1.030)
pH: 7.5 (ref 5.0–8.0)

## 2011-01-18 LAB — URINE MICROSCOPIC-ADD ON

## 2011-01-18 LAB — DIFFERENTIAL
Eosinophils Absolute: 0.2 10*3/uL (ref 0.0–0.7)
Eosinophils Relative: 1 % (ref 0–5)
Lymphocytes Relative: 30 % (ref 12–46)
Lymphs Abs: 4.5 10*3/uL — ABNORMAL HIGH (ref 0.7–4.0)
Monocytes Absolute: 0.5 10*3/uL (ref 0.1–1.0)

## 2011-01-18 LAB — LIPASE, BLOOD: Lipase: 18 U/L (ref 11–59)

## 2011-01-18 LAB — CBC
Platelets: 287 10*3/uL (ref 150–400)
RBC: 4.21 MIL/uL (ref 3.87–5.11)
WBC: 14.9 10*3/uL — ABNORMAL HIGH (ref 4.0–10.5)

## 2011-01-19 LAB — BASIC METABOLIC PANEL
BUN: 12 mg/dL (ref 6–23)
BUN: 14 mg/dL (ref 6–23)
BUN: 4 mg/dL — ABNORMAL LOW (ref 6–23)
CO2: 22 mEq/L (ref 19–32)
CO2: 25 mEq/L (ref 19–32)
CO2: 25 mEq/L (ref 19–32)
CO2: 26 mEq/L (ref 19–32)
Calcium: 8.3 mg/dL — ABNORMAL LOW (ref 8.4–10.5)
Calcium: 8.9 mg/dL (ref 8.4–10.5)
Chloride: 106 mEq/L (ref 96–112)
Chloride: 110 mEq/L (ref 96–112)
Creatinine, Ser: 0.67 mg/dL (ref 0.4–1.2)
GFR calc Af Amer: >60 mL/min (ref >60)
GFR calc non Af Amer: >60 mL/min (ref >60)
GFR calc non Af Amer: >60 mL/min (ref >60)
GFR calc non Af Amer: >60 mL/min (ref >60)
Glucose, Bld: 101 mg/dL — ABNORMAL HIGH (ref 70–99)
Glucose, Bld: 92 mg/dL (ref 70–99)
Glucose, Bld: 98 mg/dL (ref 70–99)
Glucose, Bld: 98 mg/dL (ref 70–99)
Potassium: 3.2 mEq/L — ABNORMAL LOW (ref 3.5–5.1)
Potassium: 3.7 mEq/L (ref 3.5–5.1)
Potassium: 4.1 mEq/L (ref 3.5–5.1)
Sodium: 136 mEq/L (ref 135–145)
Sodium: 139 mEq/L (ref 135–145)
Sodium: 140 mEq/L (ref 135–145)

## 2011-01-19 LAB — COMPREHENSIVE METABOLIC PANEL
ALT: 15 U/L (ref 0–35)
AST: 24 U/L (ref 0–37)
Alkaline Phosphatase: 103 U/L (ref 39–117)
CO2: 24 mEq/L (ref 19–32)
Calcium: 9.1 mg/dL (ref 8.4–10.5)
GFR calc Af Amer: >60 mL/min (ref >60)
GFR calc non Af Amer: >60 mL/min (ref >60)
Potassium: 3.5 mEq/L (ref 3.5–5.1)
Sodium: 141 mEq/L (ref 135–145)
Total Protein: 7.1 g/dL (ref 6.0–8.3)

## 2011-01-19 LAB — URINE MICROSCOPIC-ADD ON

## 2011-01-19 LAB — CBC
HCT: 35.8 % — ABNORMAL LOW (ref 36.0–46.0)
HCT: 39 % (ref 36.0–46.0)
HCT: 41.9 % (ref 36.0–46.0)
HCT: 43.3 % (ref 36.0–46.0)
Hemoglobin: 12.5 g/dL (ref 12.0–15.0)
Hemoglobin: 13.6 g/dL (ref 12.0–15.0)
Hemoglobin: 14.6 g/dL (ref 12.0–15.0)
Hemoglobin: 14.9 g/dL (ref 12.0–15.0)
MCHC: 34.6 g/dL (ref 30.0–36.0)
MCHC: 34.7 g/dL (ref 30.0–36.0)
MCHC: 34.8 g/dL (ref 30.0–36.0)
MCHC: 35 g/dL (ref 30.0–36.0)
MCV: 98.5 fL (ref 78.0–100.0)
MCV: 98.9 fL (ref 78.0–100.0)
Platelets: 251 10*3/uL (ref 150–400)
Platelets: 270 10*3/uL (ref 150–400)
Platelets: 324 10*3/uL (ref 150–400)
RBC: 4.26 MIL/uL (ref 3.87–5.11)
RBC: 4.34 MIL/uL (ref 3.87–5.11)
RDW: 13.6 % (ref 11.5–15.5)
RDW: 13.8 % (ref 11.5–15.5)
RDW: 13.9 % (ref 11.5–15.5)
WBC: 17.7 10*3/uL — ABNORMAL HIGH (ref 4.0–10.5)
WBC: 19.3 10*3/uL — ABNORMAL HIGH (ref 4.0–10.5)

## 2011-01-19 LAB — DIFFERENTIAL
Basophils Absolute: 0.1 10*3/uL (ref 0.0–0.1)
Basophils Relative: 1 % (ref 0–1)
Eosinophils Absolute: 0 10*3/uL (ref 0.0–0.7)
Eosinophils Relative: 0 % (ref 0–5)
Eosinophils Relative: 0 % (ref 0–5)
Eosinophils Relative: 3 % (ref 0–5)
Lymphocytes Relative: 13 % (ref 12–46)
Lymphocytes Relative: 32 % (ref 12–46)
Lymphs Abs: 2.3 10*3/uL (ref 0.7–4.0)
Lymphs Abs: 3.1 10*3/uL (ref 0.7–4.0)
Monocytes Absolute: 0.6 10*3/uL (ref 0.1–1.0)
Monocytes Relative: 2 % — ABNORMAL LOW (ref 3–12)
Neutro Abs: 5.1 10*3/uL (ref 1.7–7.7)

## 2011-01-19 LAB — LIPID PANEL
Cholesterol: 202 mg/dL — ABNORMAL HIGH (ref 0–200)
HDL: 58 mg/dL (ref >39)
Total CHOL/HDL Ratio: 3.5 RATIO
Triglycerides: 101 mg/dL (ref <150)

## 2011-01-19 LAB — CLOSTRIDIUM DIFFICILE EIA
C difficile Toxins A+B, EIA: NEGATIVE
C difficile Toxins A+B, EIA: NEGATIVE

## 2011-01-19 LAB — GLUCOSE, CAPILLARY
Glucose-Capillary: 101 mg/dL — ABNORMAL HIGH (ref 70–99)
Glucose-Capillary: 111 mg/dL — ABNORMAL HIGH (ref 70–99)
Glucose-Capillary: 80 mg/dL (ref 70–99)
Glucose-Capillary: 88 mg/dL (ref 70–99)
Glucose-Capillary: 99 mg/dL (ref 70–99)

## 2011-01-19 LAB — RAPID URINE DRUG SCREEN, HOSP PERFORMED
Amphetamines: NOT DETECTED
Barbiturates: NOT DETECTED
Benzodiazepines: NOT DETECTED
Tetrahydrocannabinol: POSITIVE — AB

## 2011-01-19 LAB — URINALYSIS, ROUTINE W REFLEX MICROSCOPIC
Leukocytes, UA: NEGATIVE
Nitrite: NEGATIVE
Specific Gravity, Urine: 1.012 (ref 1.005–1.030)
Urobilinogen, UA: 0.2 mg/dL (ref 0.0–1.0)
pH: 7.5 (ref 5.0–8.0)

## 2011-01-19 LAB — STOOL CULTURE

## 2011-01-19 LAB — TSH: TSH: 1.021 u[IU]/mL (ref 0.350–4.500)

## 2011-01-19 LAB — HEMOCCULT GUIAC POC 1CARD (OFFICE): Fecal Occult Bld: NEGATIVE

## 2011-01-19 LAB — GIARDIA/CRYPTOSPORIDIUM SCREEN(EIA): Giardia Screen - EIA: NEGATIVE

## 2011-02-04 LAB — URINALYSIS, ROUTINE W REFLEX MICROSCOPIC
Glucose, UA: NEGATIVE mg/dL
Hgb urine dipstick: NEGATIVE
Ketones, ur: 15 mg/dL — AB
Protein, ur: NEGATIVE mg/dL
Urobilinogen, UA: 0.2 mg/dL (ref 0.0–1.0)

## 2011-02-04 LAB — CBC
Hemoglobin: 13.3 g/dL (ref 12.0–15.0)
Hemoglobin: 13.8 g/dL (ref 12.0–15.0)
Hemoglobin: 14 g/dL (ref 12.0–15.0)
MCHC: 33.6 g/dL (ref 30.0–36.0)
MCHC: 33.7 g/dL (ref 30.0–36.0)
MCHC: 34 g/dL (ref 30.0–36.0)
MCV: 92.2 fL (ref 78.0–100.0)
MCV: 92.5 fL (ref 78.0–100.0)
RBC: 4.29 MIL/uL (ref 3.87–5.11)
RBC: 4.4 MIL/uL (ref 3.87–5.11)
RDW: 17.4 % — ABNORMAL HIGH (ref 11.5–15.5)
RDW: 17.8 % — ABNORMAL HIGH (ref 11.5–15.5)

## 2011-02-04 LAB — CLOSTRIDIUM DIFFICILE EIA: C difficile Toxins A+B, EIA: NEGATIVE

## 2011-02-04 LAB — COMPREHENSIVE METABOLIC PANEL
Albumin: 4.2 g/dL (ref 3.5–5.2)
Alkaline Phosphatase: 103 U/L (ref 39–117)
BUN: 5 mg/dL — ABNORMAL LOW (ref 6–23)
CO2: 25 mEq/L (ref 19–32)
Chloride: 107 mEq/L (ref 96–112)
GFR calc non Af Amer: >60 mL/min (ref >60)
Potassium: 3.8 mEq/L (ref 3.5–5.1)
Total Bilirubin: 0.5 mg/dL (ref 0.3–1.2)

## 2011-02-04 LAB — BASIC METABOLIC PANEL
BUN: 7 mg/dL (ref 6–23)
CO2: 23 mEq/L (ref 19–32)
CO2: 24 mEq/L (ref 19–32)
CO2: 25 mEq/L (ref 19–32)
Calcium: 8.9 mg/dL (ref 8.4–10.5)
Chloride: 107 mEq/L (ref 96–112)
Chloride: 109 mEq/L (ref 96–112)
Creatinine, Ser: 0.73 mg/dL (ref 0.4–1.2)
GFR calc Af Amer: >60 mL/min (ref >60)
GFR calc non Af Amer: >60 mL/min (ref >60)
Glucose, Bld: 114 mg/dL — ABNORMAL HIGH (ref 70–99)
Glucose, Bld: 140 mg/dL — ABNORMAL HIGH (ref 70–99)
Potassium: 2.8 mEq/L — ABNORMAL LOW (ref 3.5–5.1)
Potassium: 3.1 mEq/L — ABNORMAL LOW (ref 3.5–5.1)
Sodium: 140 mEq/L (ref 135–145)

## 2011-02-04 LAB — DIFFERENTIAL
Basophils Relative: 0 % (ref 0–1)
Eosinophils Absolute: 0 10*3/uL (ref 0.0–0.7)
Monocytes Absolute: 0.4 10*3/uL (ref 0.1–1.0)
Monocytes Relative: 3 % (ref 3–12)

## 2011-02-04 LAB — LIPID PANEL
Cholesterol: 169 mg/dL (ref 0–200)
LDL Cholesterol: 100 mg/dL — ABNORMAL HIGH (ref 0–99)
VLDL: 23 mg/dL (ref 0–40)

## 2011-02-04 LAB — RAPID URINE DRUG SCREEN, HOSP PERFORMED
Amphetamines: NOT DETECTED
Benzodiazepines: NOT DETECTED
Cocaine: POSITIVE — AB
Opiates: POSITIVE — AB
Tetrahydrocannabinol: POSITIVE — AB

## 2011-02-04 LAB — GIARDIA/CRYPTOSPORIDIUM SCREEN(EIA): Cryptosporidium Screen (EIA): NEGATIVE

## 2011-02-15 LAB — COMPREHENSIVE METABOLIC PANEL
ALT: 14 U/L (ref 0–35)
AST: 26 U/L (ref 0–37)
Alkaline Phosphatase: 104 U/L (ref 39–117)
CO2: 21 mEq/L (ref 19–32)
GFR calc Af Amer: >60 mL/min (ref >60)
GFR calc non Af Amer: >60 mL/min (ref >60)
Glucose, Bld: 107 mg/dL — ABNORMAL HIGH (ref 70–99)
Potassium: 3.8 mEq/L (ref 3.5–5.1)
Sodium: 140 mEq/L (ref 135–145)

## 2011-02-15 LAB — CBC
Hemoglobin: 12.1 g/dL (ref 12.0–15.0)
RBC: 5.29 MIL/uL — ABNORMAL HIGH (ref 3.87–5.11)
WBC: 18.1 10*3/uL — ABNORMAL HIGH (ref 4.0–10.5)

## 2011-02-15 LAB — DIFFERENTIAL
Basophils Relative: 0 % (ref 0–1)
Eosinophils Absolute: 0 10*3/uL (ref 0.0–0.7)
Eosinophils Relative: 0 % (ref 0–5)
Lymphs Abs: 2 10*3/uL (ref 0.7–4.0)
Monocytes Absolute: 0.5 10*3/uL (ref 0.1–1.0)
Neutrophils Relative %: 86 % — ABNORMAL HIGH (ref 43–77)

## 2011-02-15 LAB — URINE MICROSCOPIC-ADD ON

## 2011-02-15 LAB — LIPASE, BLOOD: Lipase: 17 U/L (ref 11–59)

## 2011-02-15 LAB — URINALYSIS, ROUTINE W REFLEX MICROSCOPIC
Glucose, UA: NEGATIVE mg/dL
Hgb urine dipstick: NEGATIVE
pH: 7.5 (ref 5.0–8.0)

## 2011-02-22 ENCOUNTER — Emergency Department (HOSPITAL_COMMUNITY)
Admission: EM | Admit: 2011-02-22 | Discharge: 2011-02-22 | Disposition: A | Discharge status: Home / Self Care | Payer: Medicare Other | Attending: Emergency Medicine | Admitting: Emergency Medicine

## 2011-02-22 DIAGNOSIS — Z79899 Other long term (current) drug therapy: Secondary | ICD-10-CM | POA: Insufficient documentation

## 2011-02-22 DIAGNOSIS — R112 Nausea with vomiting, unspecified: Secondary | ICD-10-CM | POA: Insufficient documentation

## 2011-02-22 DIAGNOSIS — G8929 Other chronic pain: Secondary | ICD-10-CM | POA: Insufficient documentation

## 2011-02-22 DIAGNOSIS — F3130 Bipolar disorder, current episode depressed, mild or moderate severity, unspecified: Secondary | ICD-10-CM | POA: Insufficient documentation

## 2011-02-22 DIAGNOSIS — R197 Diarrhea, unspecified: Secondary | ICD-10-CM | POA: Insufficient documentation

## 2011-02-22 DIAGNOSIS — M549 Dorsalgia, unspecified: Secondary | ICD-10-CM | POA: Insufficient documentation

## 2011-02-22 DIAGNOSIS — R109 Unspecified abdominal pain: Secondary | ICD-10-CM | POA: Insufficient documentation

## 2011-02-22 DIAGNOSIS — K589 Irritable bowel syndrome without diarrhea: Secondary | ICD-10-CM | POA: Insufficient documentation

## 2011-03-15 NOTE — H&P (Signed)
[name], [name] NO.:  [id number]   MEDICAL RECORD NO.:  [id number]          PATIENT TYPE:  IPS   LOCATION:  0500                          FACILITY:  [facility]   PHYSICIAN:  Geoffery Lyons, M.D.      DATE OF BIRTH:  [birth year]   DATE OF ADMISSION:  09/03/2007  DATE OF DISCHARGE:                       PSYCHIATRIC ADMISSION ASSESSMENT   HISTORY OF PRESENT ILLNESS:  The patient presents with a history of  substance abuse, states that she has been in denial all her life, has  been using marijuana since the age of 55 and one year ago started using  cocaine.  She states that she wants to get her life back together, used  to have joy in her life, laugh. She states that she has medical problems  with irritable bowel syndrome, has to go to the emergency department  which she states she needs IV's and IV medications.  She feels that she  also may have an eating disorder.  She has lost over 100 pounds.  The  patient's stressors are her mother has died, she has had suicides in the  family.  Her brother and her cousin have both committed suicide.  She  states her husband also has problems with substance use.  The patient  states that she has been having some difficulty sleeping, denies any  suicidal thoughts.  Denies any psychosis and is requesting help.   PAST PSYCHIATRIC HISTORY:  This is his first admission to [facility].  No other psychiatric admissions.  She did see a  therapist back in 2005.   SOCIAL HISTORY:  This is a 55 year old married female, married for eight  years, has three children from her previous marriages.  This is her  third marriage.  She lives with her husband.  She is on disability for  psychiatric and medical problems.  States that she also has a legal  issue for her marijuana use but states that it will be dismissed.   FAMILY HISTORY:  Positive for depression.  Again she states her brother  committed suicide, a cousin committed  suicide.   ALCOHOL AND DRUG HISTORY:  Nonsmoker and substances as described above.  Primary care Graclyn Lawther, was going to pain management clinic in [place].  States that she can no longer go back there, states that they did not  listen to her.   MEDICAL PROBLEMS:  Reports problems with cervical migraines and back  pain and irritable bowel syndrome.   MEDICATIONS:  None currently.  Has been buying opiates at times off the  streets.   DRUG ALLERGIES:  No known allergies.   PHYSICAL EXAMINATION:  GENERAL:  The patient was fully assessed at [facility].  She received Dilaudid and Zofran.  VITAL SIGNS:  Temperature is 97.2, 76 heart rate, 18 respirations, blood  pressure 151/103, 106 pounds, approximately 5 feet 4 inches tall.   LABORATORY DATA:  WBC count 17.1, glucose 121, lipase is 14, platelet  count is elevated at 436.  Magnesium 2.4.  Urine drug screen positive  for opiates, positive cocaine and positive for THC.  WBC count  did come  down to 12.1 on 09/04/07.   Again, this is a thin female who is fully alert, cooperative, good eye  contact.  Somewhat unkempt.  Speech is clear.  She is rambling some.  Mood is depressed.  The patient's affect is flat.  She did smile at the  end.  She did get teary-eyed momentarily.  Thought process, there was no  evidence of any thought disorder.  Cognitive function intact.  Her  memory is good.  Judgment and insight is impaired.  Poor impulse  control.   AXIS I:  Substance induced mood disorder.  Polysubstance abuse.  AXIS II:  Deferred.  AXIS III:  Neck and back pain.  Irritable bowel syndrome.  AXIS IV:  Psychosocial problems.  Medical problems.  Problems related to  legal system.  AXIS V:  Current is 45.   PLAN:  Contract for safety.  Stabilize mood and thinking.  We will detox  with the Librium protocol.  Work on relapse prevention.  The patient may  need an antidepressant.  Will encourage fluid intake.  Increase coping  skills.  The  patient will be transitioned to [address] to be able to  participate in groups and be more involved in the milieu of the unit.  Will consider family session with her husband as well and case managers  will address her follow-up.  Length of stay 4-5 days.      Landry Corporal, N.P.      Geoffery Lyons, M.D.  Electronically Signed    JO/MEDQ  D:  09/04/2007  T:  09/05/2007  Job:  [id number]

## 2011-03-18 NOTE — Discharge Summary (Signed)
NAMEBRYTTNEY, NETZER NO.:  [id number]   MEDICAL RECORD NO.:  [id number]          PATIENT TYPE:  IPS   LOCATION:  0505                          FACILITY:  [facility]   PHYSICIAN:  Geoffery Lyons, M.D.      DATE OF BIRTH:  August 30, 1956   DATE OF ADMISSION:  09/03/2007  DATE OF DISCHARGE:  09/10/2007                               DISCHARGE SUMMARY   CHIEF COMPLAINT/PRESENT ILLNESS:  This was the first admission to [facility] for this 55 year old female who presented with a  history of substance abuse.  Claimed that she had been in denial all  her life, had been using marijuana since the age of 77 and one year  prior to this admission started using cocaine.  She wanted to get her  life back together.  Used to have joy in her life.  She has medical  problems with irritable bowel syndrome and also claims that she might  have an eating disorder, has lost over 100 pounds.  Stressors are her  mother has died and she has had suicide in the family.  Her brother and  her cousin have both committed suicide.  Her husband also has problems  with substance abuse.  She has been having difficulty with sleeping.   PAST PSYCHIATRIC HISTORY:  [facility].  No other  psychiatric admissions.  She saw a therapist back into 2005 according to  history.  Admits to using marijuana and cocaine.   MEDICAL HISTORY:  1. Migraines.  2. Back pain.  3. Irritable bowel syndrome.   MEDICATION:  Had been buying opiates at times off the street.   PHYSICAL EXAMINATION:  Physical examination was performed but did not  show any acute findings.   LABORATORY WORK:  White blood cells 12.1, hemoglobin 14.1, mean  corpuscular volume 97.3, TSH 1.017, glucose 121, lipase 14, platelet  count 436, magnesium 2.4.  UDS positive for opiates, cocaine, marijuana.   MENTAL STATUS EXAM:  Reveals an alert and cooperative female, good eye  contact, somewhat unkempt.  Speech was clear, some  pressure.  Mood was  anxious, depressed.  Affect was anxious.  Thought processes logical,  coherent and relevant, circumstantial, somewhat rambling.  No delusions.  No active suicidal or homicidal ideas, no hallucinations.  Cognition  well-preserved.   ADMITTING MENTAL STATUS:  AXIS I:  Cocaine, marijuana, opiates abuse,  rule out dependence.  Rule-out substance-induced mood disorder.  AXIS II:  No diagnosis.  AXIS III:  Neck and back pain, irritable bowel syndrome.  AXIS IV:  Moderate.  AXIS V:  On admission 35, Global Assessment of Function (GAF) in the  last year 60.   COURSE IN THE HOSPITAL:  She was admitted, started individual and group  psychotherapy.  We detoxified with clonidine.  We used trazodone for  sleep.  We treated symptomatically.  She endorsed having been depressed,  using marijuana early on and then cocaine a year ago.  She had to go to  the ED with nausea, vomiting, throwing up.  She felt that she was hungry  but when  she wants to eat, she cannot, she throws up.  Claimed that  stress started in 04/05/2003, mother died, brother and nephew committed  suicide.  Endorsed conflictive relationship.  Lost custody of the grand  kids.  They went back to their house.  Lost life.  She endorsed that  she was having a very hard time with her mood, anxiety, distress,  migraines, somatically focused, pressured speech, circumstantial.  We  pursued detoxification, relapse prevention and at the same time we were  addressing the comorbidities.  She usually gets Tylenol #3 but aware  that we were going to abstain from using opiates.   On September 06, 2007, she endorsed that she was feeling a little better.  She was worried about what was going to happen when she got out of the  hospital, worried about being out of the hospital, being back home and  interacting with the husband who abuses substances.   On September 07, 2007, she continued to have a hard time, still dealing  with the multiple  medical issues, somatically focused, got very upset  after the husband and son did not show up to visit after she really  asked them to come.  She was on the phone with the husband.  She was  apparently verbally aggressive, upset, had a hard time settling down.  She did want the husband to pursue treatment for his own addiction.  If  he was not to do so, she endorsed that she was ready to move on.  We  pursued a detoxification, addressed the symptomatology.   She continued to improve as she opened up and she was detoxified.  She  was wanting to go to a residential program in [place].  She had a sister  in [place] who was supportive.  She was focusing on eating better.  Sleep was an issue so trazodone was increased.  There was a family  session on September 08, 2007.  She was able to discuss her depression and  her addiction, discuss unresolved issues from her childhood.  The  husband was agreeable to get help for himself.  He also agreed to get  rid of any drugs or alcohol that might be in the house.   So on September 10, 2007, she endorsed that she was ready to leave the  hospital.  She was going to be admitted to a residential treatment  center in [place] on Wednesday.  She wanted to be home and spend some  time with the husband and the son as well as her dog before she goes.  She is committed to make this work.  Family knows about her use, for  what they were going to be keeping an eye on her.  She endorsed no  suicidal or homicidal ideas, no hallucinations or delusions, so we went  ahead and discharged to outpatient follow-up.   DISCHARGE DIAGNOSES:  AXIS I:  1.  Marijuana, cocaine and opiate abuse,  rule out dependence.  2.  Substance-induced mood disorder.  3.  Depressive disorder, not otherwise specified.  4.  Anxiety disorder, not  otherwise specified.  AXIS II:  No diagnosis.  AXIS III:  Neck and back pain, irritable bowel syndrome.  AXIS IV:  Moderate.  AXIS V:  On discharge  55 to 60.   DISCHARGE MEDICATIONS:  1. Lioresal 20 mg one three times a day.  2. Protonix 40 mg per day.  3. Naproxen 500 mg one twice a day.  4. Trazodone 150  at bedtime for sleep.   FOLLOW-UP INSTRUCTIONS:  Follow up at [facility]. [facility] in  [place].     Geoffery Lyons, M.D.  Electronically Signed    IL/MEDQ  D:  09/28/2007  T:  09/28/2007  Job:  [id number]

## 2011-03-18 NOTE — Discharge Summary (Signed)
NAMEEMMERY, SEILER             ACCOUNT NO.:  [id number]   MEDICAL RECORD NO.:  [id number]          PATIENT TYPE:  INP   LOCATION:  5709                         FACILITY:  [facility]   PHYSICIAN:  Madaline Guthrie, M.D.    DATE OF BIRTH:  [birth year]   DATE OF ADMISSION:  04/30/2006  DATE OF DISCHARGE:  05/02/2006                                 DISCHARGE SUMMARY   CHIEF COMPLAINT:  Vomiting and diarrhea x3 days.   DISCHARGE DIAGNOSIS:  Vomiting, diarrhea and uncomplicated urinary tract  infection.   PROCEDURES/CONSULTATIONS:  There were no procedures or consultations on this  patient.   BRIEF HISTORY OF PRESENT ILLNESS:  This was a 55 year old female with  complaint of worsening vomiting and diarrhea.  Her symptoms began 6 days  prior with nausea that was controlled with her p.o. medications.  However,  on Friday she began experiencing episodes of vomiting which gradually  worsened.  Early Saturday morning she also developed episodes of diarrhea.  She was unable to keep down oral intake of liquids or foods, and she  presented to the hospital on Sunday afternoon.   This patient has a history of chronic diarrhea since 2002 with occasional  vomiting.  She was diagnosed with irritable bowel syndrome in 2005.  She has  had numerous workups for her diarrhea, all of which have returned negative  including colonoscopy in November 2006 which was negative.  She also has  been found to have a normal sed rate, IgA, TTA, IgG and stool fat.   Today when we came to see the patient for the first time we found her laying  in her bed and curled up in fetal position.  She looked very uncomfortable.  She had an emesis basin which was cleaned out at the time, though she said  which was confirmed by nurses that her last episode of emesis had included a  brown-like substance which she described as looking like skin.  The nurses  note described as brown, possibly coffee ground emesis.  Negative until  Friday, and on Friday night she began having worsening episodes of vomiting.  She denied having any episodes of diarrhea while in the hospital.  She was  unable to keep down any foods or liquids.   PAST MEDICAL HISTORY:  Past medical history is also significant for  hematemesis which she had on her last admission here in March 2007.  She  also has diabetes mellitus type 2, migraines, endometriosis, chronic neck  and back pain from a previous accident, and depression.   LABORATORY DATA:  Her first CBC included a white count of 11.3, potassium  level of 2.7, urinalysis which showed 3 to 6 white blood cells per high  power field and many bacteria.   The patient was started on IV fluids and was given Imodium and Phenergan  12.5 mg p.r.n. for nausea, and Tylenol No. 3 p.r.n. q. 8h. for pain.  She  was also repleted with 4 potassium runs.   ADMISSION PHYSICAL EXAMINATION:  On admission her vital signs were 99.1  temperature, blood pressure 92/56, pulse 82.  She  remained afebrile through  her course in the hospital.  She was started on Ciprofloxacin 400 mg IV 2  times per day for uncomplicated urinary tract infection.  By her second day  in the hospital her symptoms had already begun to improve.  She was not  having any episodes of diarrhea.  After some morning events, her episodes of  vomiting had ceased.  However, she had pain during the night, and she was  given morphine 1 mg once and given a p.r.n. order, but she did not require  it again.   HOSPITAL COURSE:  She also complained of some pain from the potassium, but  that went away and her potassium levels returned to within normal limits.   Also a drug screen was performed on this patient, and on her second day in  the hospital they did return and were positive for cocaine metabolites.  The  patient was counseled on this issue.  She said it was first time she had  tried it. She was counseled on the importance of not continuing such   behavior,  and she agreed that she would never try it again.   By May 02, 2006 her symptoms had again resolved.  She no longer had any  vomiting, and she was able to hold down a soft food diet without a problem.  She wanted to go home, and again she remained afebrile.  Her temperature was  97.9, pulse 61, blood pressure 109/71, and she was converted to  Ciprofloxacin 500 mg oral twice daily.  Her IV fluids were stopped.  Since  she was getting a 24-hour urine study for her levels, that study ended at  8:40 p.m., and she was discharged shortly after that.  In addition to the  Ciprofloxacin 500 mg p.o. twice daily for 5 days for which a prescription  was written for a 5-day supply, 10 pills total, no other prescriptions were  written, but she is leaving on her home medications which include Tylenol  No. 3 one tab p.o. every 4 to 6 hours p.r.n. pain, Imodium 2 tabs p.o.  as  needed for diarrhea, Premarin 0.625 p.o. once daily, Paroxetine 10 mg p.o.  once daily, Baclofen 20 mg p.o. every 8 hours as needed for back spasms, and  Reglan 5 mg p.o. every 6 hours as needed for nausea.   DISCHARGE INSTRUCTIONS:  Discharge instructions included restarting her  diet, eating bland foods and increasing as tolerated.  An appointment was  made with [facility].  Her Clinic physician  is Dr. Renae Fickle, and I listed myself, Dr. Noel Gerold, as the second option.  My pager  number is [phone].      Valetta Close, M.D.       Madaline Guthrie, M.D.     JC/MEDQ  D:  05/04/2006  T:  05/04/2006  Job:  [id number]

## 2011-03-18 NOTE — Discharge Summary (Signed)
[name], HUMM NO.:  [id number]   MEDICAL RECORD NO.:  [id number]          PATIENT TYPE:  INP   LOCATION:  0175                         FACILITY:  [facility]   PHYSICIAN:  Ileana Roup, M.D.  DATE OF BIRTH:  10/22/1956   DATE OF ADMISSION:  01/02/2006  DATE OF DISCHARGE:  01/05/2006                                 DISCHARGE SUMMARY   DISCHARGE DIAGNOSES:  1.  Nausea and vomiting with hematemesis.  2.  Chronic diarrhea with significant weight loss.  3.  Leukocytosis.  4.  Hematuria.  5.  Diabetes mellitus.  6.  History of abnormal mammogram.  7.  History of endometriosis, status post hysterectomy in 1990.  8.  History of migraine.  9.  Depression.   DISCHARGE MEDICATIONS:  1.  Cipro 500 mg p.o. b.i.d. x4 more days.  2.  Paxil 10 mg p.o. daily.  3.  Phenergan 25 mg q.4-6h. p.r.n. nausea.  4.  Metamucil p.r.n.  5.  Tylenol No. 3 p.r.n. pain.  6.  Premarin 0.625 mg p.o. as prescribed by Dr. Renae Fickle.   CONDITION ON DISCHARGE:  Stable with resolution of nausea, vomiting and  hematemesis and diarrhea that is consistent with baseline.   She will follow up with Dr. Renae Fickle at [facility] on January 12, 2006, at 11:30 a.m.  A CBC and BMET will be obtained.   CONSULTATION:  GI, Dr. Laural Benes.   PROCEDURES:  1.  EGD, January 02, 2006, normal except for the presence of a moderate size      hiatal hernia, no signs of upper GI bleeding.  2.  Chest x-ray, January 03, 2006, diffuse peribronchial thickening, probably      bronchitic, somewhat more marked in the region of the right middle lobe,      slight blunting of the left costophrenic angle.  3.  Abdominal x-ray, January 03, 2006, no acute findings.  4.  Small-bowel follow-through, January 05, 2006, negative except for hiatal      hernia.   BRIEF HISTORY AND PHYSICAL:  [name] is a 55 year old with a history of  chronic diarrhea complicated by at least 70 pound weight loss, nausea and  vomiting, most  recently admitted in October 2006, with similar symptoms, who  presents today with the same.  She states that the nausea and vomiting has  been significantly worse.  On the day of admission she was unable to  tolerate her oral Phenergan.  While being evaluated in the outpatient  clinic, she had two episodes of hematemesis for approximately 60 to 90 mL of  bright red blood.  She states that the diarrhea has been chronic for one to  two years.  She has anywhere from two to three liquid stools per day.  The  day prior to admission, she tolerated three meals without difficulty and  felt quite well but woke up the morning of admission with excessive  vomiting, also complains of crampy epigastric pain, denies any fever, but  has had some chills.  She denies __________ drugs.  She is married and lives  with her  husband here in [place], [place].   FAMILY HISTORY:  Mother died at age 44 with brain cancer.  Sister also had  breast cancer. Father died of a motor vehicle accident.   REVIEW OF SYSTEMS:  Positive for chills, nausea, vomiting, diarrhea,  hematemesis, epigastric abdominal pain.  Negative for chest pain,  palpitations, dyspnea, cough, constipation, dysuria, flank pain.  Patient  comfortable.   PHYSICAL EXAMINATION:  HEENT:  Eyes:  Pupils 7 to 8 mm, symmetric and  reactive, Sclerae are anicteric.  ENT:  Mucous membranes are dry.  Oropharynx is clear.  NECK:  Supple without JVD.  LUNGS:  Clear to auscultation bilaterally.  CARDIOVASCULAR:  Regular rate and rhythm.  ABDOMEN:  Scaphoid, mild tenderness epigastrically.  No round or guarding.  Positive bowel sounds.  EXTREMITIES:  No edema.  SKIN:  No rashes.  NEUROLOGIC:  Nonfocal exam.   LABORATORY DATA:  White count 13.8, hemoglobin 16.2, platelets 374, ANC 11.  Calcium 9.9.  Lipase 17.  ESR 5.   HOSPITAL COURSE BY PROBLEM:  PROBLEM #1 -  HEMATEMESIS:  The patient had  another episode of hematemesis with a significant  amount of bright red  blood.  GI was consulted and she was taken for an immediate  esophagogastroduodenoscopy which was negative except for a hiatal hernia.  Patient did not have any more episodes of vomiting or hematemesis, but  etiology is unclear.  She did have a small-bowel follow-through done which  was also normal.  Her last colonoscopy in October of 2006, was reviewed and  was negative as well.  Given her history of endometriosis, she may need an  MRI of the abdomen and pelvis.  This was discussed with the radiologist and  apparently Dr. Reche Dixon would be able to provide a protocol to best image for  endometriosis of the bowel.  The patient was able to tolerate p.o. without  any difficulty by the time of discharge.   PROBLEM #2 -  CHRONIC DIARRHEA:  Records were obtained from [facility] where  the patient was previously seen and there was a documented 70 pound weight  loss since 2003.  Work-up so far has been negative including a colonoscopy  in October 2006, the small-bowel follow-through that was done on this  admission, the EGD that was done on this admission, transglutaminase  antibodies that were negative in clinic and stool studies that have also  been negative.  She did have a __________ , however, the colonoscopy did not  confirm this.  In addition, her ESR during this admission was only 5.  The  cause of the chronic diarrhea is unclear at this point.  It may all be due  to irritable bowel syndrome, however, the significant weight loss is  concerning.   PROBLEM #3 -  MICROSCOPIC HEMATURIA:  The patient's urinalysis showed large  blood with many red blood cells on admission, and this was also seen in  previous urinalyses looking through past records.  A urine culture showed no  growth and a repeat urinalysis the day prior to discharge showed 0 to 2 red  blood cells and may warrant imaging or cystoscopy as an outpatient.  PROBLEM #4 -  LEUKOCYTOSIS:  Patient had a white count  of maximum 7.3 during  this hospitalization, which decreased to 11.4 at the time of discharge.  However, the cause of leukocytosis was not __________ previous  hospitalization in October.  The patient was started on Cipro on the night  of admission when  there was concern for an upper GI bleed and she appears to  respond clinically to the antibiotic, therefore it will be completed for a  seven-day course.  It is possible that this is a gastroenteritis or even  bacterial in nature.  However, she will need repeat CBC as an outpatient to  ensure that the leukocytosis does not persist.  Otherwise, there would be  concern for a lymphoma or inflammatory bowel disease or other occult  malignancy.   PROBLEM #5 - HYPOXIA:  On January 03, 2006, the patient experienced  desaturation requiring __________, did not indicate hypoxia and a D-dimer  was negative.  The patient no longer required oxygen beginning the following  day and maintained on room air.   PROBLEM #6 -  HISTORY OF ABNORMAL MAMMOGRAM:  Apparently she had mammogram  showing scattered calcifications.   PROBLEM #7 -  DIABETES:  Patient's blood sugars were controlled using diet  and sliding scale insulin.   PROBLEM #8 -  HISTORY OF HYSTERECTOMY:  Patient has been taking Premarin on  a p.r.n. basis at home.  I discussed with her the risks of this medication  and heart disease and breast cancer on a long-term basis.  She will discuss  the options of tapering off with her __________.   Blood pressure 110/72, pulse 50, respirations 18, oxygen saturation 96% on  room air.  __________ Hemoglobin 13.8, platelets 262, sodium 146, potassium  3.7, chloride 112, bicarb 26, BUN 5, creatinine 0.7, glucose 99.   She will follow up on January 12, 2006, with her primary care physician,  Ellie Lunch, M.D.      Clent Demark, M.D.    ______________________________  Ileana Roup, M.D.    Verlin Grills  D:  01/05/2006  T:  01/06/2006  Job:   [id number]   cc:   Ellie Lunch, M.D.  Fax: [phone]   Danise Edge, M.D.  Fax: [phone]

## 2011-03-18 NOTE — Discharge Summary (Signed)
[name], KUBA NO.:  [id number]   MEDICAL RECORD NO.:  [id number]          PATIENT TYPE:  INP   LOCATION:  5709                         FACILITY:  [facility]   PHYSICIAN:  Valetta Close, M.D.   DATE OF BIRTH:  09-12-1956   DATE OF ADMISSION:  04/30/2006  DATE OF DISCHARGE:  05/02/2006                                 DISCHARGE SUMMARY   Attending Dr. Wyonia Hough  The discharge summary has been sent to the outpatient clinic.  Continuity  doctor is Dr. Renae Fickle.   DISCHARGE DIAGNOSES:  1.  Vomiting and diarrhea.  2.  Urinary tract infection.  3.  Hypokalemia  4.  Positive drug screen for cocaine metabolites.   1.  She was discharged with ciprofloxacin 500 mg p.o. twice daily for 5 days      to finish her 7 day course.  2.  Premarin 0.625 mg oral once daily.  3.  Tylenol No. 3 one tab by mouth every 4-6 hours p.r.n. for pain.  4.  Imodium 2 tabs by mouth as needed for diarrhea.  5.  Paroxetine 10 mg oral once daily.  6.  Baclofen 20 mg oral every 8 hours as needed for back spasms.  7.  Reglan 5 mg oral every 6 hours as needed for nausea.   Her condition at discharge was stable with no labs for followup.  She was  going to be contacted by our lab for followup with her primary, Dr. Renae Fickle.  No procedures or consultations were done during this hospital stay.   BRIEF HISTORY AND PHYSICAL:  This was a 55 year old female with a long  history of irritable bowel syndrome, chronic diarrhea and weight loss over  the last 3 years.  She has also had hematemesis in the past with her last  episode occurring in May of 2007.  She had been feeling nausea for 1 week  and it was controlled with her Reglan at home.  However, she began vomiting  on Friday night, 2 days prior to admission.  She also had an increase in the  frequency of her diarrhea in addition to being unable to make it to the  bathroom.  Was unable to keep any liquids down, so she came to the hospital.  While in the ER  she continued having emetic episodes containing a brownish  substance, which she described as looking almost like skin and also feeling  a burning in her throat.  She was also having abdominal pain and nausea.  Important in her history is her previous GI workup including negative  colonoscopy in November of 2006.  Also, a normal sed rate, IgA, TTA, IgG and  slow fat test also done recently.   Vital signs on admission were a temperature of 99.1, blood pressure 92/56,  pulse of 82, respiratory rate 24, 98% oxygen saturation on room air and she  weighed 117 pounds.  This was again a 55 year old female who was laying in the fetal position  with an emesis basin next to her, appearing very uncomfortable when we first  examined her.  Her lungs are clear to  auscultation on the left; however, she had  inspiratory and expiratory wheezing on the right.  She had good air entry.  Cardiac exam:  S1, S2.  No murmurs.  Regular rate and rhythm.  GI exam:  Bowel sounds were positive.  She had midepigastric tenderness;  however, no rebound or guarding.  She had no lower extremity edema.  We noticed that her skin pigmentation was almost a grayish to yellow;  however, she said that is how her skin always looks.  She was A&O x3 and  alert and focused.   ADMISSION LABORATORIES:  Sodium was 141, potassium of 2.7, chloride of 107,  bicarb 22.9, BUN of 14, creatinine 0.8, glucose 122.  Hemoglobin 14.1,  hematocrit 41, white blood cell count 11.3, platelets 344, MCV 99.6.  The  urinalysis was essentially negative; however, her microscopic look showed 3-  6 white blood cells per high-powered field, 7-10 red blood cells per high-  powered field and many  bacteria.  She also had a urine drug screen, which  was positive for cocaine, opiates and tetrahydrocannabis.  The opiates can  be explained by the medications she is on.   HOSPITAL COURSE:  1.  nausea, vomiting and diarrhea.  Her diarrhea essentially resolved  after      her admission on Sunday.  She also had no further episodes of emesis      once leaving the ER and coming to the floor.  She was given Phenergan      12 .5 mg p.r.n. for nausea, which she did not need to take and she was      also given Reglan.  She was able to start eating on Monday, first with      clears and being able to tolerate them well and then she was moved up to      solids, then she felt better throughout her course.  Plain films of the      chest and abdomen showed no acute changes in the abdomen.  However, she      did have signs of chronic bronchitis in her chest and the changes of      COPD.  She continued to feel fine during her stay and was looking      forward to going home and because her GI workup was essentially      negative, and her symptoms had resolved, she was just told to follow up      and resume her home medications.   1.  hypokalemia.  When she came in her low potassium was 2.7 on admission.      We repleted her first with IV KCl along with normal saline, then we      switched to p.o. K-Dur.  Her potassium slowly resolved, went from 2.7 to      3.1 on the 3rd and continued to rise, so we just pushed her to continue      with her oral intake.  Likely secondary to her nausea and vomiting   1.  urinary tract infection for which we started her on ciprofloxacin 100 mg      IV twice daily.  She was on that regime for 2 days and then she was      switched to oral ciprofloxacin 500 mg twice daily and she was instructed      to continue that for 5 more days.   4)(+) Cocaine on her UDS:  Patient was counciled on the need not to  experiment with dangerous narcotics, as she claimed that this was her first  time experimenting with cocaine.   5)She also eluded to a history of diabetes.  However, her sugar levels were  normal during her hospital stay and her HbA1c was 5.3, so that was not  considered an issue.  For her discharge vitals, her temperature was  97.9, pulse 61, respirations  18, blood pressure 95/60.  However, her blood pressure had remained in that  vicinity during her hospital stay and that is where she normally is.  Her  oxygen saturation was 99% on room air.  On discharge her sodium was 140,  potassium 3.1, chloride 109, bicarb 24, glucose 117, BUN 10, creatinine 0.8,  blood albumin 3.1, calcium 8.5, phosphorus 3.1.  Her blood cultures were  negative.  Her iron studies:  Iron 60, iron binding capacity 268, percent  saturation 22.  Her ferritin was 86.  Her magnesium was 1.8.  Her CBC was  hemoglobin 13.8, hematocrit 39.3, platelet count 337, white blood cell count  was elevated at 17.  However, we believe it is likely secondary to distress  from her diarrhea and vomiting and maybe the urinary tract infection.  She  was discharged on ciprofloxacin, so this was something that she could follow  up on her next  outpatient clinic.  Her pending labs include her urine culture, which came  back sensitive for ciprofloxacin, so we do need to change her antibiotics.  The results of her L-aminolevulinic acid was essentially within normal  limits.  Blood cultures both returned negative.      Valetta Close, M.D.  Electronically Signed     JC/MEDQ  D:  05/20/2006  T:  05/20/2006  Job:  [id number]

## 2011-03-18 NOTE — Consult Note (Signed)
NAMELATOSHIA, Griffith             ACCOUNT NO.:  [id number]   MEDICAL RECORD NO.:  [id number]          PATIENT TYPE:  INP   LOCATION:  5707                         FACILITY:  [facility]   PHYSICIAN:  [name], MDDATE OF BIRTH:  Nov 01, 1955   DATE OF CONSULTATION:  08/25/2005  DATE OF DISCHARGE:                                   CONSULTATION   REASON FOR CONSULTATION:  Diarrhea.   HISTORY OF PRESENT ILLNESS:  [name] was seen as a consultation  at the request of Dr. Jonne Griffith. Lane's service due to her diarrhea and  possible colitis on CT.  The patient is a 55 year old white female admitted  on August 24, 2005, due to persistent watery diarrhea and intermittent  nausea and vomiting.  She reports that the diarrhea usually occurs at night  and frequently wakes her up from her sleep, and she goes multiple times each  day when she has a flare-up.  She states that this all started two years  ago, and prior to that she was having regular bowel movements each day.  She  also reports having an over 100 pound weight loss since 2004.  She states  that the diarrhea worsened immediately prior to admission, and due to  feeling lightheaded and dizzy she came in for further evaluation.  She  denies ever having endoscopic evaluation of this diarrhea and denies seeing  any blood in her diarrhea.  She denies any family history of Crohn's disease  or ulcerative colitis.  She had an abdominal CT scan done in August during a  visit to the hospital at that time for the same reason, and that showed  possible colitis in her colon.  The initial report on that also showed  possible small bowel perforation, but on an addendum report, stated that  this was incorrect and that there was no perforation.  She denied any recent  fevers or chills.  She denied any antibiotics.   Since her admission she continues to have diarrhea and her stool studies are  pending at this time.   PAST MEDICAL  HISTORY:  1.  Chronic diarrhea, as stated in the HPI.  2.  History of migraines.  3.  History of depression.  4.  History of irritable bowel syndrome.   MEDICATIONS ON ADMISSION:  1.  Premarin.  2.  Paxil.  3.  Tylenol.  4.  Imodium.  5.  Metamucil.   PHYSICAL EXAMINATION:  VITAL SIGNS:  Temperature 100.3 degrees, pulse 68,  blood pressure 113/72, O2 saturation 98% on room air.  GENERAL:  Thin, in no acute distress, alert and oriented x3.  HEENT:  Anicteric sclerae.  CHEST:  Clear to auscultation bilaterally.  CARDIOVASCULAR:  A regular rate and rhythm without murmurs.  ABDOMEN:  Soft, nontender, non-distended.  Active bowel sounds.  EXTREMITIES:  No edema.   LABORATORY DATA:  CBC:  White blood cell count 14.7, hemoglobin 12,  hematocrit 37, platelet count 309.  Sodium 141, potassium 2.9, chloride 110,  CO2 of 24, BUN 6, creatinine 0.7, glucose 100.  Amylase 47.  TSH 2.8.  ASSESSMENT/RECOMMENDATIONS:  A 55 year old white female with chronic watery  diarrhea with weight loss and possible colitis seen on a CT scan in August  2006.  The differential diagnoses include inflammatory bowel disease,  although with this is less likely than typical due to her late onset, but  not impossible.  Due to her CT scan findings previously and continued  diarrhea and weight loss, would recommend endoscopic evaluation, to rule out  Crohn's disease or ulcerative colitis.  I agree with stool studies to rule  out infectious etiologies, although if her diarrhea is as chronic as she  states, infection would be unlikely.  Will plan on doing a colonoscopy on  August 26, 2005, and the patient may also need a small bowel follow-through  after her colonoscopy, pending the endoscopic results.      Anne Friar, MD  Electronically Signed     VCS/MEDQ  D:  08/25/2005  T:  08/26/2005  Job:  [id number]

## 2011-03-18 NOTE — Discharge Summary (Signed)
[name], [name]             ACCOUNT NO.:  [id number]   MEDICAL RECORD NO.:  [id number]          PATIENT TYPE:  INP   LOCATION:  5709                         FACILITY:  [facility]   PHYSICIAN:  Madaline Guthrie, M.D.    DATE OF BIRTH:  1956-04-22   DATE OF ADMISSION:  04/30/2006  DATE OF DISCHARGE:  05/02/2006                                 DISCHARGE SUMMARY   CONTINUITY DOCTOR:  Dr. Renae Fickle.  The appointment was made after hours, so I am  currently unsure of who the appointment was made with.   DISCHARGE DIAGNOSES:  1.  Vomiting and diarrhea.  2.  An uncomplicated urinary tract infection.   DISCHARGE MEDICATIONS:  1.  The patient was discharged on ciprofloxacin 500 mg oral twice daily for      10 additional days to complete a 2 week course.  2.  Premarin 0.625 mg p.o. 1 daily.  3.  Reglan 5 mg p.r.n. for nausea.  4.  Paroxetine 10 mg p.o. once daily.  5.  Tylenol No. 3 one tab q.6 hours p.r.n. for pain.  6.  Imodium p.r.n. for diarrhea.  7.  She is also on baclofen, which was prescribed to her through a pain      clinic and she is unsure of the dosage.   CONDITION AT DISCHARGE:  Stable.   Follow up for her urine cultures and to have an appointment at the clinic  within 2 weeks.   X-rays of the abdomen and chest showed parenchymal damage consistent with  chronic bronchitis or COPD and no acute abnormalities of the abdomen.   There were no consultations for this patient.   The patient came in with a chief complaint of emesis beginning 2 days prior  to admission and diarrhea beginning a day and a half prior to admission.  She said she had been nauseous the previous week, but had been able to  control it with her p.r.n. medications.  However, on Friday she began  experiencing worsening episodes of vomiting and was not able to keep down  anything that she ate and the diarrhea also kicked in on Saturday morning.  Nothing made her symptoms better.  Nothing made then worse.  She has  a long  history of these symptoms.  She did not notice any blood in her stools or in  her emesis and she stated that these are symptoms that are consistent with  those that she has experienced in the past.  She has had episodes like this  since 2002.  She was diagnosed with irritable bowel system in 2005.  Her  last hospitalization was in March of 2007 when she had episodes of diarrhea  and hematemesis.   On admission her temperature was 99.1.  Her blood pressure was 92/56, pulse  82, and 98% on room air and a respiratory rate of 24.  We observed her as a 55 year old female appearing her stated age, she was  lying in the fetal position with an emesis basin and appeared very  uncomfortable.  Her respiratory exam was significant for inspiratory and expiratory wheezing  on  the right side.  Bowel sounds were positive.  She displayed midepigastric tenderness.  However, she had no rebound or guarding.  She also stated that she had lower  left abdominal tenderness.  No edema was noted in her extremities.  SKIN:  Her pigment was kind of a yellowish tone.  When she was asked about  it she said that that is her normal skin pigment.   Labs on admission were sodium 141, potassium 2.7, chloride 107, bicarb 23.9,  BUN 14, creatinine 0.8, glucose 122.  White count 11.3, hemoglobin 14.1,  hematocrit 41, platelets 344, MCV of 99.6 and an ANC of 9.2.  Her urinalysis  had many bacteria per high powered field, 7-10 RBCs per high powered field,  3-6 white blood cells per high powered field.  A urine tox screen was sent  out, which was positive for cocaine metabolites, opioids and  tetrahydrocannabis.   First problem was the emesis and diarrhea.  Upon coming to the hospital she  was given Phenergan 12.5 mg p.r.n. for nausea and Imodium for any episodes  of diarrhea.  Once coming to the hospital she did not have any episodes of  diarrhea and her vomiting improved after leaving the emergency room.  She   received morphine 4 mg once for pain, but after that had no further  complaints of pain and stated that she was feeling better the following day.  On the second day of admission, July 3rd, she was able to tolerate a liquid  diet and was advanced to a soft diet and was able to tolerate that, as well.  She had potassium depletion, probably secondary to her diarrhea.  She was  given 5 runs of potassium and her potassium level returned to 3.1, but she  was able to tolerate p.o. intake, so we did not really need to give her  additional potassium.  Also, her episodes of diarrhea had ceased.  As far as  her what appeared to be an uncomplicated urinary tract infection, after  receiving the lab results we started her on ciprofloxacin 400 mg IV twice  daily.  On the second day of hospitalization she was switched to  ciprofloxacin 500 mg oral twice daily and she was sent home on the  ciprofloxacin to complete a 7 day course.  As far as the positive cocaine  metabolites on the urine tox screen the patient was confronted and she  admitted to taking cocaine the week prior, saying it was her first time.  She was counseled on the potential adverse effects of taking cocaine and  agreed that she would not attempt to try it again and she was pleasant and  open to the conversation.   Discharge labs include sodium of 140, potassium 3.1, chloride 109, bicarb  24, glucose 117, BUN 10, creatinine 0.8, albumin 3.1, calcium 8.5,  phosphorus 3.1.  She had iron studies performed because of her diarrhea.  Her ferritin was 86.  Iron was 60, total iron binding capacity 368, percent  saturation 22.  White blood cell count was 17, hemoglobin was 13.8,  hematocrit was 39.3, platelets 337.  She had a urine culture pending.  That  culture has since returned.  It grew Klebsiella oxytoca 50,000 colonies and  it was sensitive to ciprofloxacin.  On summary, she did not have any iron deficiency.  Her potassium was on its  way  up with our repletion and since she was able to eat it was no longer  deemed a  concern.  White blood cell count was still elevated; however, she  was on the oral ciprofloxacin and she was not symptomatic for her  uncomplicated UTI, so we think discharging on  ciprofloxacin by itself would be okay, especially considering she remained  afebrile with a blood pressure that stayed in the same area they were.  They  ran low throughout her course.  At 2:00 on July 3rd they were 109/71, which  was typically the same area they had been throughout her admission.      Valetta Close, M.D.       Madaline Guthrie, M.D.     JC/MEDQ  D:  05/04/2006  T:  05/05/2006  Job:  [id number]

## 2011-03-18 NOTE — Op Note (Signed)
[name], [name]             ACCOUNT NO.:  [id number]   MEDICAL RECORD NO.:  [id number]          PATIENT TYPE:  INP   LOCATION:  5707                         FACILITY:  [facility]   PHYSICIAN:  [name], MDDATE OF BIRTH:  1956-09-01   DATE OF PROCEDURE:  08/26/2005  DATE OF DISCHARGE:                                 OPERATIVE REPORT   PROCEDURE PERFORMED:  Colonoscopy.   ENDOSCOPIST:  [name], MD.   INDICATIONS:  Diarrhea.   MEDICATIONS:  1.  Fentanyl 175 mcg.  2.  Versed 12.5 mg.   DESCRIPTION OF PROCEDURE:  Rectal exam was normal.  The pediatric adjustable  colonoscope was inserted into a well-prepped colon and advanced to the cecum  where the ileocecal valve and appendiceal orifice were identified.  The  terminal ileum was intubated and was normal in appearance.  Two biopsies  were taken randomly in the terminal ileum.  Careful withdrawal of the  colonoscope revealed a normal-appearing colon including normal mucosa and  normal vascular pattern.  Random biopsies were taken on the left side of the  colon to rule out microscopic colitis.  Retroflexion revealed internal  hemorrhoids.   ASSESSMENT:  Small internal hemorrhoids.  Otherwise normal colonoscopy.   PLAN:  1.  Follow up with path to rule out microscopic colitis.  2.  Suspect diarrhea is functional in nature.  3.  If weight loss is verified, would do upper GI series and small bowel      follow through to be complete.  Otherwise would manage her functional      problems.      [name], MD  Electronically Signed     VCS/MEDQ  D:  08/26/2005  T:  08/27/2005  Job:  [id number]

## 2011-03-18 NOTE — Discharge Summary (Signed)
[name], [name]             ACCOUNT NO.:  [id number]   MEDICAL RECORD NO.:  [id number]          PATIENT TYPE:  INP   LOCATION:  5707                         FACILITY:  [facility]   PHYSICIAN:  [name], M.D.  DATE OF BIRTH:  10-28-1956   DATE OF ADMISSION:  08/24/2005  DATE OF DISCHARGE:  08/29/2005                                 DISCHARGE SUMMARY   DISCHARGE DIAGNOSES:  1.  Chronic diarrhea.  2.  Hypokalemia.  3.  Depression.  4.  Hot flashes.  5.  Pain in fingers and wrists.   DISCHARGE MEDICATIONS:  1.  Metamucil powder one teaspoon q.h.s.  2.  Paxil 10 mg p.o. daily.  3.  Tylenol #3, PRN.  4.  Premarin 0.625 mg p.o. daily.  5.  Trilafon two tabs p.o. daily PRN.  6.  Antidiarrheal's (Imodium) PRN.  7.  Phenergan 25 mg p.o. q.4-6h. PRN.   FOLLOW UP:  Follow up with Dr. Ellie Griffith on September 15, 2005 at 11:30  A.M.  in our internal medicine outpatient clinic, at this time to rule out  infectious causes of chronic diarrhea as a stool sample was never obtained  during the patient's hospital stay.  Studies of stool to determine secretory  versus fatty versus watery versus inflammatory diarrhea must also be done.   CONSULTATIONS THIS ADMISSION:  Anne Friar, MD, gastroenterologist.   PROCEDURES THIS ADMISSION:  Colonoscopy with biopsies on August 25, 2005.   CLINICAL DATA:  Images this admission include acute abdominal series on  August 24, 2005 which was negative.  X-ray of both hands on August 29, 2005 which was negative for rheumatoid arthritis; bone mineral density was  normal.  Remarkable for foreign body in left distal thumb.   HISTORY OF PRESENT ILLNESS:  Briefly, [name] is a 55 year old Caucasian  woman with a two year history of nausea, vomiting and diarrhea being worked  up as an outpatient, who presented to the emergency department with  complaints of nausea and vomiting since 3 A.M. on the day of admission.  She  has not been able to  keep anything down. She tried taking antiemetics on the  day of admission but to no relief.  Vomitus initially contained food  particles but has only contained mucous the past couple of times.  It is  associated with abdominal pain, epigastric, only secondary to retching.  No  history of fever, urinary disturbances.  Her history of diarrhea is a  chronic complaint, not blood streaked.  No history of travel or sick  contacts.   ALLERGIES:  No known drug allergies.   PAST MEDICAL HISTORY:  Migraines, depression, endometriosis.  Status post  lumpectomy bilateral in 1989 and 1993.  CT scan done on June 27, 2005  showed multiple small liver cysts, suboptimal opacification, small bowel  perforations or interloop abscesses or small lung mesenteries suggestive of  diffuse colitis, ? Crohn's disease versus ulcerative colitis versus  pseudocolitis, extensive colon diverticulitis.  Status post hysterectomy in  1990 secondary to endometriosis.   HABITS:  Patient has never smoked.  No history of alcohol  or drug use.   SOCIAL HISTORY:  She is originally from [place] but moved to [place]  in 1998. She is married and has three boys.  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  She says  she does have a lot of stress at home.   FAMILY HISTORY:  Her mother died at 24 because of brain cancer. Her father  was killed in a car accident.  Her brother committed suicide.  Her older  sister was diagnosed with breast cancer at age 38.   MEDICATIONS:  1.  Metamucil powder one teaspoon twice daily.  2.  Premarin 0.625 p.o. daily.  3.  Paxil 10 mg p.o. daily.  4.  Tylenol #3 PRN.   PHYSICAL EXAMINATION:  VITAL SIGNS:  Temperature 96.6, pulse 60, blood  pressure 137/82, respiratory rate 18, oxygen saturation 97% on room air.  GENERAL APPEARANCE:  The patient did not appear in any acute distress.  HEENT:  Pupils equal, round, reactive to light.   Extraocular muscles intact.  Oropharynx clear.  No exudate.  NECK:  Supple, no lymphadenopathy.  LUNGS:  Air entry is equal bilaterally.  Occasional inspiratory wheezes.  HEART:  Regular rate and rhythm.  No murmurs, rubs or gallops.  ABDOMEN:  Soft, epigastric tenderness present.  No masses felt.  No rebound  tenderness.  Bowel sounds present.  EXTREMITIES: No pedal edema.  Pulses present bilaterally.  NEUROLOGICAL:  Alert and oriented X3.  No focal neurological deficits.   LABORATORY DATA:  Sodium 144, potassium 3.3, chloride 113, bicarb 23, BUN 8,  creatinine 0.6, glucose 112.  Hemoglobin 12.1, hematocrit 34.6, MCV 97.6,  RDW 13.0, white cell count 17.3, ANC 15.6, platelet count 320,000.  Alkaline  phosphatase 63, SGOT 20, SGPT 13, albumin 3.6, calcium 8.2.   PROBLEM LIST:  PROBLEM #1:  CHRONIC DIARRHEA:  Etiology still uncertain.  The patient was admitted to rule out inflammatory bowel disease with  colonoscopy and biopsy.  On day of admission the patient did not have any  bowel movement secondary to not eating the previous day.  On the following  day she did have two to three bowel movements but did not send it for sample  so therefore stool studies were asked for but were never really sent so it  is difficult to rule out infectious causes of diarrhea. She does have an  elevated white cell count on labs.  All other laboratory data were negative.  Stool for TTG and IgA level were sent in the outpatient department which  came back as negative.  She was in the middle of work up as an outpatient  when she came to the emergency room and was admitted.  Colonoscopy showed no  abnormalities except for some internal hemorrhoids.  Biopsies on colonoscopy  procedure were negative for microscopic colitis and collagenous colitis.  The patient was discharged as further work up can be done as an outpatient. Studies of stool to determine secretory versus fatty versus watery versus  inflammatory  disease need to be done.  If all organic causes are ruled out  this could be factitious disorder or some variant thereof.  Supportive  treatment was continued in the hospital with intravenous fluids and she was  continued on her Metamucil powder.   PROBLEM #2:  HYPOKALEMIA SECONDARY TO DIARRHEA:  Her potassium was repleted  in the hospital and on discharge her potassium level was 3.7.   PROBLEM #3:  DEPRESSION:  The patient does not seem to be depressed now and  denies any symptoms of suicidal ideation.  She does, however, have a lot of  stressors at home.  She was continued on Paxil 10 mg p.o. daily throughout  her hospitalization.   PROBLEM #4: HOT FLASHES:  The patient was kept on Premarin 0.625 mg p.o.  daily as previously prescribed.   PROBLEM #5: PAIN IN WRISTS AND FINGERS:  The patient said the pain was more  in the morning when she got up and lasts for about one to two hours.  Her  history was not typical of rheumatoid arthritis, however, the patient wanted  to be tested for the same.  X-rays were done of both her hands and wrists to  look for early signs of rheumatoid arthritis.  These came back as negative  and the patient was reassured before discharge.   DISCHARGE LABORATORY DATA:  Sodium 139, potassium 3.7, chloride 111, bicarb  24, BUN 4, creatinine 0.8, glucose 117, calcium 8.7, hemoglobin 12.8,  hematocrit 36.9, MCV 98.4, white blood cell count 10.9, platelet count  283,000.   DISCHARGE VITAL SIGNS:  Temperature 98.6, pulse 54, respiratory rate 18,  blood pressure 124/76, oxygen saturation 98% on room air, weight of 110.1.   NOTE:  We tried to obtain records to document her weight loss of 100 pounds  that patient claims to have lost, but were not able to get her records.      Yetta Barre, M.D.    ______________________________  [name], M.D.    SS/MEDQ  D:  09/04/2005  T:  09/05/2005  Job:  [id number]

## 2011-03-18 NOTE — Op Note (Signed)
NAMEJENISIS, HARMSEN NO.:  [id number]   MEDICAL RECORD NO.:  [id number]          PATIENT TYPE:  INP   LOCATION:  6738                         FACILITY:  [facility]   PHYSICIAN:  Danise Edge, M.D.   DATE OF BIRTH:  12/12/1955   DATE OF PROCEDURE:  01/02/2006  DATE OF DISCHARGE:                                 OPERATIVE REPORT   PROCEDURE INDICATION:  [name] is a 55 year old female born  1956/07/16.  Ms. Linders has had episodes of hematemesis associated  with protracted vomiting.   ENDOSCOPIST:  Danise Edge, M.D.   PREMEDICATION:  Fentanyl 100 mcg, Versed 10 mg.   PROCEDURE:  After obtaining informed consent, Ms. Rodenbeck was placed in the  left lateral decubitus position.  I administered intravenous Fentanyl and  intravenous Versed to achieve conscious sedation for the procedure.  The  patient's blood pressure, oxygen saturation and cardiac rhythm were  monitored throughout the procedure and documented in the medical record.   The Olympus gastroscope was passed through the posterior hypopharynx into  the proximal esophagus without difficulty.  The hypopharynx, larynx and  vocal cords appeared normal.   Esophagoscopy:  The proximal, mid, and lower segments of the esophageal  mucosa appeared normal.  There is no blood in the esophagus.  There is no  sign of a Mallory-Weiss tear.  With air insufflation, the esophageal mucosa  flattened out and I do not detect any esophageal varices.   Gastroscopy:  Ms. Wellen has a moderate sized hiatal hernia.  Retroflexed view  of the gastric cardia and fundus was normal.  The gastric body, antrum and  pylorus appeared normal.  There is no blood in the stomach and no signs of  peptic ulcer disease or gastric bleeding.   Duodenoscopy:  The duodenal bulb and descending duodenum appeared normal.   ASSESSMENT:  Normal esophagogastroduodenoscopy except for the presence of a  moderate sized hiatal hernia.  No signs  of upper gastrointestinal bleeding.           ______________________________  Danise Edge, M.D.     MJ/MEDQ  D:  01/02/2006  T:  01/03/2006  Job:  [id number]

## 2011-04-06 ENCOUNTER — Emergency Department (HOSPITAL_COMMUNITY)
Admission: EM | Admit: 2011-04-06 | Discharge: 2011-04-06 | Disposition: A | Discharge status: Home / Self Care | Payer: Medicare Other | Attending: Emergency Medicine | Admitting: Emergency Medicine

## 2011-04-06 DIAGNOSIS — R1032 Left lower quadrant pain: Secondary | ICD-10-CM | POA: Insufficient documentation

## 2011-04-06 DIAGNOSIS — F319 Bipolar disorder, unspecified: Secondary | ICD-10-CM | POA: Insufficient documentation

## 2011-04-06 DIAGNOSIS — R112 Nausea with vomiting, unspecified: Secondary | ICD-10-CM | POA: Insufficient documentation

## 2011-04-06 DIAGNOSIS — R197 Diarrhea, unspecified: Secondary | ICD-10-CM | POA: Insufficient documentation

## 2011-04-06 DIAGNOSIS — K589 Irritable bowel syndrome without diarrhea: Secondary | ICD-10-CM | POA: Insufficient documentation

## 2011-04-06 LAB — CBC
HCT: 42.7 % (ref 36.0–46.0)
MCH: 33.7 pg (ref 26.0–34.0)
MCHC: 35.6 g/dL (ref 30.0–36.0)
MCV: 94.7 fL (ref 78.0–100.0)
Platelets: 344 10*3/uL (ref 150–400)
RDW: 13.6 % (ref 11.5–15.5)
WBC: 13.6 10*3/uL — ABNORMAL HIGH (ref 4.0–10.5)

## 2011-04-06 LAB — COMPREHENSIVE METABOLIC PANEL
ALT: 12 U/L (ref 0–35)
BUN: 10 mg/dL (ref 6–23)
CO2: 23 mEq/L (ref 19–32)
Calcium: 9.4 mg/dL (ref 8.4–10.5)
Creatinine, Ser: 0.62 mg/dL (ref 0.4–1.2)
GFR calc non Af Amer: >60 mL/min (ref >60)
Glucose, Bld: 127 mg/dL — ABNORMAL HIGH (ref 70–99)
Total Protein: 7.5 g/dL (ref 6.0–8.3)

## 2011-04-06 LAB — DIFFERENTIAL
Eosinophils Absolute: 0.2 10*3/uL (ref 0.0–0.7)
Eosinophils Relative: 2 % (ref 0–5)
Lymphocytes Relative: 29 % (ref 12–46)
Lymphs Abs: 3.9 10*3/uL (ref 0.7–4.0)
Monocytes Absolute: 0.8 10*3/uL (ref 0.1–1.0)

## 2011-04-06 LAB — LIPASE, BLOOD: Lipase: 20 U/L (ref 11–59)

## 2011-04-27 ENCOUNTER — Emergency Department (HOSPITAL_COMMUNITY)
Admission: EM | Admit: 2011-04-27 | Discharge: 2011-04-27 | Disposition: A | Discharge status: Home / Self Care | Payer: Medicare Other | Attending: Emergency Medicine | Admitting: Emergency Medicine

## 2011-04-27 DIAGNOSIS — K219 Gastro-esophageal reflux disease without esophagitis: Secondary | ICD-10-CM | POA: Insufficient documentation

## 2011-04-27 DIAGNOSIS — F3130 Bipolar disorder, current episode depressed, mild or moderate severity, unspecified: Secondary | ICD-10-CM | POA: Insufficient documentation

## 2011-04-27 DIAGNOSIS — R112 Nausea with vomiting, unspecified: Secondary | ICD-10-CM | POA: Insufficient documentation

## 2011-04-27 DIAGNOSIS — R197 Diarrhea, unspecified: Secondary | ICD-10-CM | POA: Insufficient documentation

## 2011-04-27 DIAGNOSIS — R10819 Abdominal tenderness, unspecified site: Secondary | ICD-10-CM | POA: Insufficient documentation

## 2011-04-27 DIAGNOSIS — Z79899 Other long term (current) drug therapy: Secondary | ICD-10-CM | POA: Insufficient documentation

## 2011-04-27 LAB — CBC
HCT: 45.4 % (ref 36.0–46.0)
RBC: 4.82 MIL/uL (ref 3.87–5.11)
RDW: 13.6 % (ref 11.5–15.5)
WBC: 11.5 10*3/uL — ABNORMAL HIGH (ref 4.0–10.5)

## 2011-04-27 LAB — COMPREHENSIVE METABOLIC PANEL
ALT: 15 U/L (ref 0–35)
AST: 30 U/L (ref 0–37)
CO2: 20 mEq/L (ref 19–32)
Chloride: 100 mEq/L (ref 96–112)
Creatinine, Ser: 0.58 mg/dL (ref 0.50–1.10)
GFR calc non Af Amer: >60 mL/min (ref >60)
Glucose, Bld: 97 mg/dL (ref 70–99)
Sodium: 135 mEq/L (ref 135–145)
Total Bilirubin: 0.3 mg/dL (ref 0.3–1.2)

## 2011-04-27 LAB — URINALYSIS, ROUTINE W REFLEX MICROSCOPIC
Bilirubin Urine: NEGATIVE
Protein, ur: NEGATIVE mg/dL
Urobilinogen, UA: 0.2 mg/dL (ref 0.0–1.0)

## 2011-04-27 LAB — POCT I-STAT, CHEM 8
BUN: 14 mg/dL (ref 6–23)
Chloride: 110 mEq/L (ref 96–112)
Glucose, Bld: 101 mg/dL — ABNORMAL HIGH (ref 70–99)
HCT: 50 % — ABNORMAL HIGH (ref 36.0–46.0)
Potassium: 4.4 mEq/L (ref 3.5–5.1)

## 2011-04-27 LAB — DIFFERENTIAL
Basophils Absolute: 0.1 10*3/uL (ref 0.0–0.1)
Eosinophils Relative: 1 % (ref 0–5)
Lymphocytes Relative: 26 % (ref 12–46)
Lymphs Abs: 3 10*3/uL (ref 0.7–4.0)
Neutro Abs: 7.9 10*3/uL — ABNORMAL HIGH (ref 1.7–7.7)
Neutrophils Relative %: 68 % (ref 43–77)

## 2011-05-25 ENCOUNTER — Emergency Department (HOSPITAL_COMMUNITY): Payer: Medicare Other

## 2011-05-25 ENCOUNTER — Emergency Department (HOSPITAL_COMMUNITY)
Admission: EM | Admit: 2011-05-25 | Discharge: 2011-05-25 | Disposition: A | Discharge status: Home / Self Care | Payer: Medicare Other | Attending: Emergency Medicine | Admitting: Emergency Medicine

## 2011-05-25 DIAGNOSIS — M25476 Effusion, unspecified foot: Secondary | ICD-10-CM | POA: Insufficient documentation

## 2011-05-25 DIAGNOSIS — X500XXA Overexertion from strenuous movement or load, initial encounter: Secondary | ICD-10-CM | POA: Insufficient documentation

## 2011-05-25 DIAGNOSIS — S93409A Sprain of unspecified ligament of unspecified ankle, initial encounter: Secondary | ICD-10-CM | POA: Insufficient documentation

## 2011-05-25 DIAGNOSIS — M25579 Pain in unspecified ankle and joints of unspecified foot: Secondary | ICD-10-CM | POA: Insufficient documentation

## 2011-05-25 DIAGNOSIS — M25473 Effusion, unspecified ankle: Secondary | ICD-10-CM | POA: Insufficient documentation

## 2011-05-25 DIAGNOSIS — S8990XA Unspecified injury of unspecified lower leg, initial encounter: Secondary | ICD-10-CM | POA: Insufficient documentation

## 2011-05-25 DIAGNOSIS — F3130 Bipolar disorder, current episode depressed, mild or moderate severity, unspecified: Secondary | ICD-10-CM | POA: Insufficient documentation

## 2011-05-25 DIAGNOSIS — G8929 Other chronic pain: Secondary | ICD-10-CM | POA: Insufficient documentation

## 2011-05-25 DIAGNOSIS — Z79899 Other long term (current) drug therapy: Secondary | ICD-10-CM | POA: Insufficient documentation

## 2011-05-25 DIAGNOSIS — S9000XA Contusion of unspecified ankle, initial encounter: Secondary | ICD-10-CM | POA: Insufficient documentation

## 2011-05-25 DIAGNOSIS — Y92009 Unspecified place in unspecified non-institutional (private) residence as the place of occurrence of the external cause: Secondary | ICD-10-CM | POA: Insufficient documentation

## 2011-05-25 DIAGNOSIS — K589 Irritable bowel syndrome without diarrhea: Secondary | ICD-10-CM | POA: Insufficient documentation

## 2011-05-25 DIAGNOSIS — M549 Dorsalgia, unspecified: Secondary | ICD-10-CM | POA: Insufficient documentation

## 2011-05-25 DIAGNOSIS — K219 Gastro-esophageal reflux disease without esophagitis: Secondary | ICD-10-CM | POA: Insufficient documentation

## 2011-06-03 ENCOUNTER — Emergency Department (HOSPITAL_COMMUNITY): Payer: Medicare Other

## 2011-06-03 ENCOUNTER — Emergency Department (HOSPITAL_COMMUNITY)
Admission: EM | Admit: 2011-06-03 | Discharge: 2011-06-04 | Disposition: A | Discharge status: Home / Self Care | Payer: Medicare Other | Attending: Emergency Medicine | Admitting: Emergency Medicine

## 2011-06-03 DIAGNOSIS — R197 Diarrhea, unspecified: Secondary | ICD-10-CM | POA: Insufficient documentation

## 2011-06-03 DIAGNOSIS — G8929 Other chronic pain: Secondary | ICD-10-CM | POA: Insufficient documentation

## 2011-06-03 DIAGNOSIS — K589 Irritable bowel syndrome without diarrhea: Secondary | ICD-10-CM | POA: Insufficient documentation

## 2011-06-03 DIAGNOSIS — K219 Gastro-esophageal reflux disease without esophagitis: Secondary | ICD-10-CM | POA: Insufficient documentation

## 2011-06-03 DIAGNOSIS — F3130 Bipolar disorder, current episode depressed, mild or moderate severity, unspecified: Secondary | ICD-10-CM | POA: Insufficient documentation

## 2011-06-03 DIAGNOSIS — Z79899 Other long term (current) drug therapy: Secondary | ICD-10-CM | POA: Insufficient documentation

## 2011-06-03 DIAGNOSIS — R1032 Left lower quadrant pain: Secondary | ICD-10-CM | POA: Insufficient documentation

## 2011-06-03 DIAGNOSIS — M549 Dorsalgia, unspecified: Secondary | ICD-10-CM | POA: Insufficient documentation

## 2011-06-03 DIAGNOSIS — R112 Nausea with vomiting, unspecified: Secondary | ICD-10-CM | POA: Insufficient documentation

## 2011-06-03 HISTORY — DX: Diaphragmatic hernia without obstruction or gangrene: K44.9

## 2011-06-03 HISTORY — DX: Irritable bowel syndrome, unspecified: K58.9

## 2011-06-03 LAB — HEPATIC FUNCTION PANEL
Albumin: 4.3 g/dL (ref 3.5–5.2)
Alkaline Phosphatase: 111 U/L (ref 39–117)
Bilirubin, Direct: 0.1 mg/dL (ref 0.0–0.3)
Indirect Bilirubin: 0.1 mg/dL — ABNORMAL LOW (ref 0.3–0.9)
Total Bilirubin: 0.2 mg/dL — ABNORMAL LOW (ref 0.3–1.2)

## 2011-06-03 LAB — BASIC METABOLIC PANEL
BUN: 14 mg/dL (ref 6–23)
Calcium: 9.6 mg/dL (ref 8.4–10.5)
Creatinine, Ser: 0.73 mg/dL (ref 0.50–1.10)
GFR calc non Af Amer: >60 mL/min (ref >60)
Glucose, Bld: 123 mg/dL — ABNORMAL HIGH (ref 70–99)

## 2011-06-03 LAB — DIFFERENTIAL
Eosinophils Relative: 1 % (ref 0–5)
Lymphocytes Relative: 19 % (ref 12–46)
Monocytes Absolute: 0.7 10*3/uL (ref 0.1–1.0)
Monocytes Relative: 4 % (ref 3–12)
Neutro Abs: 13 10*3/uL — ABNORMAL HIGH (ref 1.7–7.7)

## 2011-06-03 LAB — CBC
HCT: 42.9 % (ref 36.0–46.0)
Hemoglobin: 15.4 g/dL — ABNORMAL HIGH (ref 12.0–15.0)
MCH: 33.5 pg (ref 26.0–34.0)
MCHC: 35.9 g/dL (ref 30.0–36.0)
MCV: 93.3 fL (ref 78.0–100.0)
RDW: 13.3 % (ref 11.5–15.5)

## 2011-06-03 LAB — LIPASE, BLOOD: Lipase: 12 U/L (ref 11–59)

## 2011-06-04 ENCOUNTER — Encounter (HOSPITAL_COMMUNITY): Payer: Self-pay | Admitting: Radiology

## 2011-06-04 LAB — URINE MICROSCOPIC-ADD ON

## 2011-06-04 LAB — URINALYSIS, ROUTINE W REFLEX MICROSCOPIC
Bilirubin Urine: NEGATIVE
Ketones, ur: NEGATIVE mg/dL
Nitrite: NEGATIVE
Protein, ur: NEGATIVE mg/dL
pH: 7.5 (ref 5.0–8.0)

## 2011-06-04 MED ORDER — IOHEXOL 300 MG/ML  SOLN
100.0000 mL | Freq: Once | INTRAMUSCULAR | Status: AC | PRN
  Administered 2011-06-04: 100 mL via INTRAVENOUS

## 2011-06-05 LAB — URINE CULTURE
Colony Count: 10000
Culture  Setup Time: 201208041150

## 2011-06-23 ENCOUNTER — Emergency Department (HOSPITAL_COMMUNITY)
Admission: EM | Admit: 2011-06-23 | Discharge: 2011-06-23 | Disposition: A | Discharge status: Home / Self Care | Payer: Medicare Other | Attending: Emergency Medicine | Admitting: Emergency Medicine

## 2011-06-23 DIAGNOSIS — R10819 Abdominal tenderness, unspecified site: Secondary | ICD-10-CM | POA: Insufficient documentation

## 2011-06-23 DIAGNOSIS — K219 Gastro-esophageal reflux disease without esophagitis: Secondary | ICD-10-CM | POA: Insufficient documentation

## 2011-06-23 DIAGNOSIS — R112 Nausea with vomiting, unspecified: Secondary | ICD-10-CM | POA: Insufficient documentation

## 2011-06-23 DIAGNOSIS — G8929 Other chronic pain: Secondary | ICD-10-CM | POA: Insufficient documentation

## 2011-06-23 DIAGNOSIS — Z79899 Other long term (current) drug therapy: Secondary | ICD-10-CM | POA: Insufficient documentation

## 2011-06-23 DIAGNOSIS — R197 Diarrhea, unspecified: Secondary | ICD-10-CM | POA: Insufficient documentation

## 2011-06-23 DIAGNOSIS — R109 Unspecified abdominal pain: Secondary | ICD-10-CM | POA: Insufficient documentation

## 2011-06-23 DIAGNOSIS — F3130 Bipolar disorder, current episode depressed, mild or moderate severity, unspecified: Secondary | ICD-10-CM | POA: Insufficient documentation

## 2011-06-23 LAB — URINALYSIS, ROUTINE W REFLEX MICROSCOPIC
Bilirubin Urine: NEGATIVE
Ketones, ur: NEGATIVE mg/dL
Leukocytes, UA: NEGATIVE
Nitrite: NEGATIVE
Protein, ur: NEGATIVE mg/dL
pH: 8 (ref 5.0–8.0)

## 2011-06-23 LAB — LIPASE, BLOOD: Lipase: 12 U/L (ref 11–59)

## 2011-06-23 LAB — URINE MICROSCOPIC-ADD ON

## 2011-06-23 LAB — COMPREHENSIVE METABOLIC PANEL
Alkaline Phosphatase: 118 U/L — ABNORMAL HIGH (ref 39–117)
Calcium: 9.5 mg/dL (ref 8.4–10.5)
GFR calc Af Amer: >60 mL/min (ref >60)
GFR calc non Af Amer: >60 mL/min (ref >60)
Sodium: 135 mEq/L (ref 135–145)
Total Bilirubin: 0.3 mg/dL (ref 0.3–1.2)
Total Protein: 7.3 g/dL (ref 6.0–8.3)

## 2011-06-23 LAB — CBC
MCH: 33 pg (ref 26.0–34.0)
MCHC: 35.6 g/dL (ref 30.0–36.0)
Platelets: 358 10*3/uL (ref 150–400)
RBC: 4.55 MIL/uL (ref 3.87–5.11)

## 2011-06-23 LAB — DIFFERENTIAL
Basophils Relative: 0 % (ref 0–1)
Eosinophils Absolute: 0 10*3/uL (ref 0.0–0.7)
Monocytes Absolute: 0.3 10*3/uL (ref 0.1–1.0)
Monocytes Relative: 1 % — ABNORMAL LOW (ref 3–12)
Neutrophils Relative %: 95 % — ABNORMAL HIGH (ref 43–77)

## 2011-08-09 LAB — DIFFERENTIAL
Basophils Absolute: 0
Eosinophils Absolute: 0.2
Eosinophils Absolute: 0.3
Eosinophils Relative: 1
Eosinophils Relative: 3
Lymphs Abs: 3.3
Monocytes Absolute: 0.8 — ABNORMAL HIGH
Monocytes Relative: 7
Neutrophils Relative %: 84 — ABNORMAL HIGH
Neutrophils Relative %: 63

## 2011-08-09 LAB — CBC
HCT: 41
Hemoglobin: 14.4
Hemoglobin: 14.1
MCV: 97.9
MCV: 97.3
RBC: 4.32
RBC: 4.22
WBC: 17.1 — ABNORMAL HIGH
WBC: 12.1 — ABNORMAL HIGH

## 2011-08-09 LAB — COMPREHENSIVE METABOLIC PANEL
ALT: 15
AST: 27
CO2: 27
Chloride: 103
Creatinine, Ser: 0.69
GFR calc Af Amer: >60
GFR calc non Af Amer: >60
Glucose, Bld: 121 — ABNORMAL HIGH
Sodium: 138
Total Bilirubin: 1.1

## 2011-08-09 LAB — MAGNESIUM: Magnesium: 2.4

## 2011-08-09 LAB — LIPASE, BLOOD: Lipase: 14

## 2011-08-09 LAB — RAPID URINE DRUG SCREEN, HOSP PERFORMED: Barbiturates: NOT DETECTED

## 2011-08-10 LAB — DIFFERENTIAL
Eosinophils Absolute: 0.5
Eosinophils Relative: 3
Lymphs Abs: 2.3
Monocytes Absolute: 0.5

## 2011-08-10 LAB — COMPREHENSIVE METABOLIC PANEL
ALT: 20
AST: 25
CO2: 23
Calcium: 9.3
Chloride: 105
GFR calc Af Amer: >60
GFR calc non Af Amer: >60
Sodium: 139

## 2011-08-10 LAB — URINALYSIS, ROUTINE W REFLEX MICROSCOPIC
Bilirubin Urine: NEGATIVE
Glucose, UA: NEGATIVE
Hgb urine dipstick: NEGATIVE
Ketones, ur: NEGATIVE
Nitrite: NEGATIVE
pH: 7

## 2011-08-10 LAB — CBC
MCHC: 34.9
RBC: 4.36
WBC: 18 — ABNORMAL HIGH

## 2011-08-10 LAB — LIPASE, BLOOD: Lipase: 19

## 2011-08-15 LAB — I-STAT 8, (EC8 V) (CONVERTED LAB)
Acid-base deficit: 1
Chloride: 106
HCT: 42
Hemoglobin: 14.3
Operator id: 285841
Potassium: 3.6
Sodium: 142

## 2011-08-15 LAB — URINALYSIS, ROUTINE W REFLEX MICROSCOPIC
Bilirubin Urine: NEGATIVE
Ketones, ur: NEGATIVE
Nitrite: NEGATIVE
Protein, ur: NEGATIVE

## 2011-08-15 LAB — POCT I-STAT CREATININE: Creatinine, Ser: 0.7

## 2012-01-31 ENCOUNTER — Other Ambulatory Visit: Payer: Self-pay | Admitting: Family Medicine

## 2012-03-06 ENCOUNTER — Encounter (HOSPITAL_COMMUNITY): Payer: Self-pay | Admitting: Emergency Medicine

## 2012-03-06 ENCOUNTER — Emergency Department (HOSPITAL_COMMUNITY)
Admission: EM | Admit: 2012-03-06 | Discharge: 2012-03-06 | Disposition: A | Discharge status: Home / Self Care | Payer: Medicare Other | Attending: Emergency Medicine | Admitting: Emergency Medicine

## 2012-03-06 ENCOUNTER — Emergency Department (HOSPITAL_COMMUNITY): Payer: Medicare Other

## 2012-03-06 DIAGNOSIS — R112 Nausea with vomiting, unspecified: Secondary | ICD-10-CM | POA: Insufficient documentation

## 2012-03-06 DIAGNOSIS — R1013 Epigastric pain: Secondary | ICD-10-CM | POA: Insufficient documentation

## 2012-03-06 DIAGNOSIS — R197 Diarrhea, unspecified: Secondary | ICD-10-CM | POA: Insufficient documentation

## 2012-03-06 DIAGNOSIS — K589 Irritable bowel syndrome without diarrhea: Secondary | ICD-10-CM | POA: Insufficient documentation

## 2012-03-06 LAB — URINALYSIS, ROUTINE W REFLEX MICROSCOPIC
Bilirubin Urine: NEGATIVE
Ketones, ur: NEGATIVE mg/dL
Nitrite: NEGATIVE
Urobilinogen, UA: 0.2 mg/dL (ref 0.0–1.0)
pH: 7 (ref 5.0–8.0)

## 2012-03-06 LAB — DIFFERENTIAL
Eosinophils Relative: 1 % (ref 0–5)
Lymphocytes Relative: 18 % (ref 12–46)
Lymphs Abs: 2 10*3/uL (ref 0.7–4.0)
Monocytes Relative: 5 % (ref 3–12)

## 2012-03-06 LAB — CBC
HCT: 41.4 % (ref 36.0–46.0)
Hemoglobin: 14.6 g/dL (ref 12.0–15.0)
MCH: 32.2 pg (ref 26.0–34.0)
MCV: 91.2 fL (ref 78.0–100.0)
Platelets: 317 10*3/uL (ref 150–400)
RBC: 4.54 MIL/uL (ref 3.87–5.11)
WBC: 11.5 10*3/uL — ABNORMAL HIGH (ref 4.0–10.5)

## 2012-03-06 LAB — COMPREHENSIVE METABOLIC PANEL
ALT: 15 U/L (ref 0–35)
Alkaline Phosphatase: 112 U/L (ref 39–117)
BUN: 8 mg/dL (ref 6–23)
CO2: 21 mEq/L (ref 19–32)
Calcium: 8.7 mg/dL (ref 8.4–10.5)
GFR calc Af Amer: >90 mL/min (ref >90)
GFR calc non Af Amer: >90 mL/min (ref >90)
Glucose, Bld: 103 mg/dL — ABNORMAL HIGH (ref 70–99)
Potassium: 3.3 mEq/L — ABNORMAL LOW (ref 3.5–5.1)
Sodium: 138 mEq/L (ref 135–145)

## 2012-03-06 LAB — POCT I-STAT, CHEM 8
Calcium, Ion: 0.98 mmol/L — ABNORMAL LOW (ref 1.12–1.32)
Chloride: 108 mEq/L (ref 96–112)
Creatinine, Ser: 1 mg/dL (ref 0.50–1.10)
Glucose, Bld: 109 mg/dL — ABNORMAL HIGH (ref 70–99)
HCT: 46 % (ref 36.0–46.0)

## 2012-03-06 LAB — LIPASE, BLOOD: Lipase: 14 U/L (ref 11–59)

## 2012-03-06 LAB — URINE MICROSCOPIC-ADD ON

## 2012-03-06 MED ORDER — SODIUM CHLORIDE 0.9 % IV BOLUS (SEPSIS)
1000.0000 mL | Freq: Once | INTRAVENOUS | Status: AC
  Administered 2012-03-06: 1000 mL via INTRAVENOUS

## 2012-03-06 MED ORDER — ONDANSETRON HCL 4 MG/2ML IJ SOLN
4.0000 mg | Freq: Once | INTRAMUSCULAR | Status: AC
  Administered 2012-03-06: 4 mg via INTRAVENOUS
  Filled 2012-03-06: qty 2

## 2012-03-06 MED ORDER — SODIUM CHLORIDE 0.9 % IV SOLN: Freq: Once | INTRAVENOUS | Status: DC

## 2012-03-06 MED ORDER — PROMETHAZINE HCL 25 MG/ML IJ SOLN
25.0000 mg | Freq: Once | INTRAMUSCULAR | Status: AC
  Administered 2012-03-06: 25 mg via INTRAVENOUS
  Filled 2012-03-06: qty 1

## 2012-03-06 MED ORDER — MORPHINE SULFATE 4 MG/ML IJ SOLN
4.0000 mg | Freq: Once | INTRAMUSCULAR | Status: AC
  Administered 2012-03-06: 4 mg via INTRAVENOUS
  Filled 2012-03-06: qty 1

## 2012-03-06 NOTE — ED Notes (Signed)
Pt taking PO fluids with no vomiting

## 2012-03-06 NOTE — ED Notes (Signed)
Pt reports V/D since Friday, also c/o abd pain, reports no relief with Phenergren and Bentyl, denies SOB/CP, NAD

## 2012-03-06 NOTE — ED Provider Notes (Signed)
History     CSN: [id number]  Arrival date & time 03/06/12  4098   First MD Initiated Contact with Patient 03/06/12 [phone]      Chief Complaint  Patient presents with  . Emesis    (Consider location/radiation/quality/duration/timing/severity/associated sxs/prior treatment) HPI history of present illness chief complaint nausea vomiting diarrhea. Patient arrived by private vehicle. History provided by patient. No language barriers identified. Information not limited. Onset of symptoms 3 days ago. Location of symptoms development at home. Symptoms helped by Phenergan. Patient denies pain so radiation severity not pertinent to this chief complaint. Timing constant. Duration 3 days. Context patient states she's had many episodes of this in the past this episode is no different. For associated signs and symptoms please refer to the review of systems. No chinstrap prior to arrival with the exception of Phenergan and Bentyl. No recent medical care. Regarding patient's social history please refer to nurse's notes. I have reviewed the patient's past medical, past surgical, past social history as well as medications and allergies.  Past Medical History  Diagnosis Date  . Hiatal hernia   . IBS (irritable bowel syndrome)   . Diverticulitis     Past Surgical History  Procedure Date  . Abdominal hysterectomy     No family history on file.  History  Substance Use Topics  . Smoking status: Never Smoker   . Smokeless tobacco: Not on file  . Alcohol Use: No    OB History    Grav Para Term Preterm Abortions TAB SAB Ect Mult Living                  Review of Systems  Constitutional: Negative for fever and chills.  HENT: Negative for trouble swallowing, neck pain and neck stiffness.   Eyes: Negative for pain, discharge and itching.  Respiratory: Negative for cough, chest tightness and shortness of breath.   Cardiovascular: Negative for chest pain, palpitations and leg swelling.    Gastrointestinal: Positive for nausea, vomiting and diarrhea. Negative for abdominal pain, constipation, blood in stool and abdominal distention.  Genitourinary: Negative for dysuria, urgency, frequency, hematuria, flank pain, decreased urine volume, difficulty urinating and pelvic pain.  Musculoskeletal: Negative for back pain and joint swelling.  Skin: Negative for rash and wound.  Neurological: Negative for dizziness, tremors, seizures, syncope, facial asymmetry, speech difficulty, weakness, light-headedness, numbness and headaches.  Hematological: Negative for adenopathy. Does not bruise/bleed easily.  Psychiatric/Behavioral: Negative for confusion and decreased concentration.    Allergies  Review of patient's allergies indicates not on file.  Home Medications  No current outpatient prescriptions on file.  BP 162/107  Pulse 67  Temp(Src) 98.6 F (37 C) (Oral)  Resp 18  Ht 5\' 7"  (1.702 m)  Wt 179 lb (81.194 kg)  BMI 28.04 kg/m2  SpO2 100%  Physical Exam  Constitutional: She is oriented to person, place, and time. She appears well-developed and well-nourished. No distress.  HENT:  Head: Normocephalic and atraumatic.  Eyes: Conjunctivae are normal. Right eye exhibits no discharge. Left eye exhibits no discharge. No scleral icterus.  Neck: Normal range of motion. Neck supple.  Cardiovascular: Normal rate, regular rhythm, normal heart sounds and intact distal pulses.   No murmur heard. Pulmonary/Chest: Effort normal and breath sounds normal. No respiratory distress. She has no wheezes. She has no rales. She exhibits no tenderness.  Abdominal: Soft. Bowel sounds are normal. She exhibits no distension and no mass. There is no tenderness. There is no rebound and no guarding.  Musculoskeletal: Normal range of motion. She exhibits no tenderness.  Neurological: She is alert and oriented to person, place, and time.  Skin: Skin is warm and dry. She is not diaphoretic.  Psychiatric: She  has a normal mood and affect.    ED Course  Procedures (including critical care time)  Labs Reviewed  URINALYSIS, ROUTINE W REFLEX MICROSCOPIC - Abnormal; Notable for the following:    APPearance HAZY (*)    Hgb urine dipstick MODERATE (*)    All other components within normal limits  POCT I-STAT, CHEM 8 - Abnormal; Notable for the following:    Glucose, Bld 109 (*)    Calcium, Ion 0.98 (*)    Hemoglobin 15.6 (*)    All other components within normal limits  CBC - Abnormal; Notable for the following:    WBC 11.5 (*)    All other components within normal limits  DIFFERENTIAL - Abnormal; Notable for the following:    Neutro Abs 8.8 (*)    All other components within normal limits  COMPREHENSIVE METABOLIC PANEL - Abnormal; Notable for the following:    Potassium 3.3 (*)    Glucose, Bld 103 (*)    Total Bilirubin 0.2 (*)    All other components within normal limits  URINE MICROSCOPIC-ADD ON  LIPASE, BLOOD   Dg Abd Acute W/chest  03/06/2012  *RADIOLOGY REPORT*  Clinical Data: Nausea, vomiting, epigastric pain, question free air or bowel obstruction  ACUTE ABDOMEN SERIES (ABDOMEN 2 VIEW & CHEST 1 VIEW)  Comparison: 01/10/2011  Findings: Moderate sized hiatal hernia. Normal heart size, mediastinal contours and pulmonary vascularity otherwise seen. Left basilar atelectasis. Minimal chronic peribronchial thickening without infiltrate or effusion. Nonspecific bowel gas in right mid abdomen. No definite evidence of bowel obstruction, bowel wall thickening, or free intraperitoneal air. Bilateral pelvic phleboliths. No urinary tract calcification. Bones diffusely demineralized.  IMPRESSION: Moderate sized hiatal hernia. Bronchitic changes with left basilar atelectasis. No acute abdominal findings.  Original Report Authenticated By: Lollie Marrow, M.D.     1. Nausea vomiting and diarrhea   2. IBS       MDM  Pt is a well appearing 56yo F with PMH of IBS with recurrent episodes of N/V/D who  presents with a 3 day episode identical to past episodes. Patient initially denied pain but later stated she had pain similar to other episodes. Phenergan and IV fluid given in the emergency department with some improvement in symptoms. Patient was also given more fluid with Zofran later which dramatically improved her symptoms. Acute abdominal series unremarkable. No Electrolyte abnormalities. Given multiple episodes of identical pain in the past in symptoms resolution with Phenergan Zofran and IV fluids I do not feel that patient has acute cholecystitis, acute pancreatitis, peptic ulcer disease, AAA mesenteric ischemia, or any other surgical causes of abdominal pain. Discharged home in stable condition with primary care physician followup.        Consuello Masse, MD 03/06/12 1736

## 2012-03-06 NOTE — Discharge Instructions (Signed)
Diarrhea Infections caused by germs (bacterial) or a virus commonly cause diarrhea. Your caregiver has determined that with time, rest and fluids, the diarrhea should improve. In general, eat normally while drinking more water than usual. Although water may prevent dehydration, it does not contain salt and minerals (electrolytes). Broths, weak tea without caffeine and oral rehydration solutions (ORS) replace fluids and electrolytes. Small amounts of fluids should be taken frequently. Large amounts at one time may not be tolerated. Plain water may be harmful in infants and the elderly. Oral rehydrating solutions (ORS) are available at pharmacies and grocery stores. ORS replace water and important electrolytes in proper proportions. Sports drinks are not as effective as ORS and may be harmful due to sugars worsening diarrhea.  ORS is especially recommended for use in children with diarrhea. As a general guideline for children, replace any new fluid losses from diarrhea and/or vomiting with ORS as follows:   If your child weighs 22 pounds or under (10 kg or less), give 60-120 mL ( -  cup or 2 - 4 ounces) of ORS for each episode of diarrheal stool or vomiting episode.   If your child weighs more than 22 pounds (more than 10 kgs), give 120-240 mL ( - 1 cup or 4 - 8 ounces) of ORS for each diarrheal stool or episode of vomiting.   While correcting for dehydration, children should eat normally. However, foods high in sugar should be avoided because this may worsen diarrhea. Large amounts of carbonated soft drinks, juice, gelatin desserts and other highly sugared drinks should be avoided.   After correction of dehydration, other liquids that are appealing to the child may be added. Children should drink small amounts of fluids frequently and fluids should be increased as tolerated. Children should drink enough fluids to keep urine clear or pale yellow.   Adults should eat normally while drinking more fluids  than usual. Drink small amounts of fluids frequently and increase as tolerated. Drink enough fluids to keep urine clear or pale yellow. Broths, weak decaffeinated tea, lemon lime soft drinks (allowed to go flat) and ORS replace fluids and electrolytes.   Avoid:   Carbonated drinks.   Juice.   Extremely hot or cold fluids.   Caffeine drinks.   Fatty, greasy foods.   Alcohol.   Tobacco.   Too much intake of anything at one time.   Gelatin desserts.   Probiotics are active cultures of beneficial bacteria. They may lessen the amount and number of diarrheal stools in adults. Probiotics can be found in yogurt with active cultures and in supplements.   Wash hands well to avoid spreading bacteria and virus.   Anti-diarrheal medications are not recommended for infants and children.   Only take over-the-counter or prescription medicines for pain, discomfort or fever as directed by your caregiver. Do not give aspirin to children because it may cause Reye's Syndrome.   For adults, ask your caregiver if you should continue all prescribed and over-the-counter medicines.   If your caregiver has given you a follow-up appointment, it is very important to keep that appointment. Not keeping the appointment could result in a chronic or permanent injury, and disability. If there is any problem keeping the appointment, you must call back to this facility for assistance.  SEEK IMMEDIATE MEDICAL CARE IF:   You or your child is unable to keep fluids down or other symptoms or problems become worse in spite of treatment.   Vomiting or diarrhea develops and becomes persistent.     There is vomiting of blood or bile (green material).   There is blood in the stool or the stools are black and tarry.   There is no urine output in 6-8 hours or there is only a small amount of very dark urine.   Abdominal pain develops, increases or localizes.   You have a fever.   Your baby is older than 3 months with a  rectal temperature of 102 F (38.9 C) or higher.   Your baby is 65 months old or younger with a rectal temperature of 100.4 F (38 C) or higher.   You or your child develops excessive weakness, dizziness, fainting or extreme thirst.   You or your child develops a rash, stiff neck, severe headache or become irritable or sleepy and difficult to awaken.  MAKE SURE YOU:   Understand these instructions.   Will watch your condition.   Will get help right away if you are not doing well or get worse.  Document Released: 10/07/2002 Document Revised: 10/06/2011 Document Reviewed: 08/24/2009 [facility] Patient Information 2012 [place].Nausea and Vomiting Nausea is a sick feeling that often comes before throwing up (vomiting). Vomiting is a reflex where stomach contents come out of your mouth. Vomiting can cause severe loss of body fluids (dehydration). Children and elderly adults can become dehydrated quickly, especially if they also have diarrhea. Nausea and vomiting are symptoms of a condition or disease. It is important to find the cause of your symptoms. CAUSES   Direct irritation of the stomach lining. This irritation can result from increased acid production (gastroesophageal reflux disease), infection, food poisoning, taking certain medicines (such as nonsteroidal anti-inflammatory drugs), alcohol use, or tobacco use.   Signals from the brain.These signals could be caused by a headache, heat exposure, an inner ear disturbance, increased pressure in the brain from injury, infection, a tumor, or a concussion, pain, emotional stimulus, or metabolic problems.   An obstruction in the gastrointestinal tract (bowel obstruction).   Illnesses such as diabetes, hepatitis, gallbladder problems, appendicitis, kidney problems, cancer, sepsis, atypical symptoms of a heart attack, or eating disorders.   Medical treatments such as chemotherapy and radiation.   Receiving medicine that makes you sleep  (general anesthetic) during surgery.  DIAGNOSIS Your caregiver may ask for tests to be done if the problems do not improve after a few days. Tests may also be done if symptoms are severe or if the reason for the nausea and vomiting is not clear. Tests may include:  Urine tests.   Blood tests.   Stool tests.   Cultures (to look for evidence of infection).   X-rays or other imaging studies.  Test results can help your caregiver make decisions about treatment or the need for additional tests. TREATMENT You need to stay well hydrated. Drink frequently but in small amounts.You may wish to drink water, sports drinks, clear broth, or eat frozen ice pops or gelatin dessert to help stay hydrated.When you eat, eating slowly may help prevent nausea.There are also some antinausea medicines that may help prevent nausea. HOME CARE INSTRUCTIONS   Take all medicine as directed by your caregiver.   If you do not have an appetite, do not force yourself to eat. However, you must continue to drink fluids.   If you have an appetite, eat a normal diet unless your caregiver tells you differently.   Eat a variety of complex carbohydrates (rice, wheat, potatoes, bread), lean meats, yogurt, fruits, and vegetables.   Avoid high-fat foods because they  are more difficult to digest.   Drink enough water and fluids to keep your urine clear or pale yellow.   If you are dehydrated, ask your caregiver for specific rehydration instructions. Signs of dehydration may include:   Severe thirst.   Dry lips and mouth.   Dizziness.   Dark urine.   Decreasing urine frequency and amount.   Confusion.   Rapid breathing or pulse.  SEEK IMMEDIATE MEDICAL CARE IF:   You have blood or brown flecks (like coffee grounds) in your vomit.   You have black or bloody stools.   You have a severe headache or stiff neck.   You are confused.   You have severe abdominal pain.   You have chest pain or trouble  breathing.   You do not urinate at least once every 8 hours.   You develop cold or clammy skin.   You continue to vomit for longer than 24 to 48 hours.   You have a fever.  MAKE SURE YOU:   Understand these instructions.   Will watch your condition.   Will get help right away if you are not doing well or get worse.  Document Released: 10/17/2005 Document Revised: 10/06/2011 Document Reviewed: 03/16/2011 [facility] Patient Information 2012 [place], [place].Irritable Bowel Syndrome Irritable Bowel Syndrome (IBS) is caused by a disturbance of normal bowel function. Other terms used are spastic colon, mucous colitis, and irritable colon. It does not require surgery, nor does it lead to cancer. There is no cure for IBS. But with proper diet, stress reduction, and medication, you will find that your problems (symptoms) will gradually disappear or improve. IBS is a common digestive disorder. It usually appears in late adolescence or early adulthood. Women develop it twice as often as men. CAUSES  After food has been digested and absorbed in the small intestine, waste material is moved into the colon (large intestine). In the colon, water and salts are absorbed from the undigested products coming from the small intestine. The remaining residue, or fecal material, is held for elimination. Under normal circumstances, gentle, rhythmic contractions on the bowel walls push the fecal material along the colon towards the rectum. In IBS, however, these contractions are irregular and poorly coordinated. The fecal material is either retained too long, resulting in constipation, or expelled too soon, producing diarrhea. SYMPTOMS  The most common symptom of IBS is pain. It is typically in the lower left side of the belly (abdomen). But it may occur anywhere in the abdomen. It can be felt as heartburn, backache, or even as a dull pain in the arms or shoulders. The pain comes from excessive bowel-muscle spasms and from  the buildup of gas and fecal material in the colon. This pain:  Can range from sharp belly (abdominal) cramps to a dull, continuous ache.   Usually worsens soon after eating.   Is typically relieved by having a bowel movement or passing gas.  Abdominal pain is usually accompanied by constipation. But it may also produce diarrhea. The diarrhea typically occurs right after a meal or upon arising in the morning. The stools are typically soft and watery. They are often flecked with secretions (mucus). Other symptoms of IBS include:  Bloating.   Loss of appetite.   Heartburn.   Feeling sick to your stomach (nausea).   Belching   Vomiting   Gas.  IBS may also cause a number of symptoms that are unrelated to the digestive system:  Fatigue.   Headaches.   Anxiety  Shortness of breath   Difficulty in concentrating.   Dizziness.  These symptoms tend to come and go. DIAGNOSIS  The symptoms of IBS closely mimic the symptoms of other, more serious digestive disorders. So your caregiver may wish to perform a variety of additional tests to exclude these disorders. He/she wants to be certain of learning what is wrong (diagnosis). The nature and purpose of each test will be explained to you. TREATMENT A number of medications are available to help correct bowel function and/or relieve bowel spasms and abdominal pain. Among the drugs available are:  Mild, non-irritating laxatives for severe constipation and to help restore normal bowel habits.   Specific anti-diarrheal medications to treat severe or prolonged diarrhea.   Anti-spasmodic agents to relieve intestinal cramps.   Your caregiver may also decide to treat you with a mild tranquilizer or sedative during unusually stressful periods in your life.  The important thing to remember is that if any drug is prescribed for you, make sure that you take it exactly as directed. Make sure that your caregiver knows how well it worked for  you. HOME CARE INSTRUCTIONS   Avoid foods that are high in fat or oils. Some examples [id number] cream, butter, frankfurters, sausage, and other fatty meats.   Avoid foods that have a laxative effect, such as fruit, fruit juice, and dairy products.   Cut out carbonated drinks, chewing gum, and "gassy" foods, such as beans and cabbage. This may help relieve bloating and belching.   Bran taken with plenty of liquids may help relieve constipation.   Keep track of what foods seem to trigger your symptoms.   Avoid emotionally charged situations or circumstances that produce anxiety.   Start or continue exercising.   Get plenty of rest and sleep.  MAKE SURE YOU:   Understand these instructions.   Will watch your condition.   Will get help right away if you are not doing well or get worse.  Document Released: 10/17/2005 Document Revised: 10/06/2011 Document Reviewed: 06/06/2008 [facility] Patient Information 2012 [place], [place].

## 2012-03-06 NOTE — ED Provider Notes (Signed)
Pt presents to the ED with complaints of nausea and vomiting.  Pt has had similar episodes in the past that have been attributed to her IBS.  Pt states this feels similar.  Pt without history of SBO.  Pt has been trying oral medications at home without relief.  On exam, alert, uncomfortable appearing.  Will give IV fluids and antiemetics and reassess.  Likely a recurrent episode of IBS.  If this does not improve with treatment would consider additional testing, labs and imaging in the ED.  I saw and evaluated the patient, reviewed the resident's note and I agree with the findings and plan.   Celene Kras, MD 03/06/12 [phone]

## 2012-05-11 ENCOUNTER — Emergency Department (HOSPITAL_COMMUNITY)
Admission: EM | Admit: 2012-05-11 | Discharge: 2012-05-11 | Disposition: A | Discharge status: Home / Self Care | Payer: Medicare Other | Attending: Emergency Medicine | Admitting: Emergency Medicine

## 2012-05-11 ENCOUNTER — Encounter (HOSPITAL_COMMUNITY): Payer: Self-pay | Admitting: Emergency Medicine

## 2012-05-11 ENCOUNTER — Emergency Department (HOSPITAL_COMMUNITY): Payer: Medicare Other

## 2012-05-11 DIAGNOSIS — R109 Unspecified abdominal pain: Secondary | ICD-10-CM

## 2012-05-11 DIAGNOSIS — Z79899 Other long term (current) drug therapy: Secondary | ICD-10-CM | POA: Insufficient documentation

## 2012-05-11 DIAGNOSIS — R197 Diarrhea, unspecified: Secondary | ICD-10-CM

## 2012-05-11 DIAGNOSIS — R112 Nausea with vomiting, unspecified: Secondary | ICD-10-CM | POA: Insufficient documentation

## 2012-05-11 DIAGNOSIS — K589 Irritable bowel syndrome without diarrhea: Secondary | ICD-10-CM | POA: Insufficient documentation

## 2012-05-11 LAB — POCT I-STAT, CHEM 8
Calcium, Ion: 1.09 mmol/L — ABNORMAL LOW (ref 1.12–1.23)
HCT: 42 % (ref 36.0–46.0)
Hemoglobin: 14.3 g/dL (ref 12.0–15.0)
TCO2: 21 mmol/L (ref 0–100)

## 2012-05-11 LAB — POCT I-STAT 3, ART BLOOD GAS (G3+)
Acid-base deficit: 1 mmol/L (ref 0.0–2.0)
O2 Saturation: 95 %

## 2012-05-11 LAB — CBC
Platelets: 361 10*3/uL (ref 150–400)
RDW: 13.7 % (ref 11.5–15.5)
WBC: 12.9 10*3/uL — ABNORMAL HIGH (ref 4.0–10.5)

## 2012-05-11 MED ORDER — PROMETHAZINE HCL 25 MG/ML IJ SOLN
12.5000 mg | Freq: Four times a day (QID) | INTRAMUSCULAR | Status: DC | PRN
  Administered 2012-05-11: 12.5 mg via INTRAVENOUS
  Filled 2012-05-11: qty 1

## 2012-05-11 MED ORDER — PROMETHAZINE HCL 25 MG/ML IJ SOLN
12.5000 mg | Freq: Once | INTRAMUSCULAR | Status: AC
  Administered 2012-05-11: 12.5 mg via INTRAVENOUS
  Filled 2012-05-11: qty 1

## 2012-05-11 MED ORDER — ONDANSETRON 8 MG PO TBDP: 8.0000 mg | ORAL_TABLET | Freq: Three times a day (TID) | ORAL | Status: AC | PRN

## 2012-05-11 MED ORDER — HYDROMORPHONE HCL PF 1 MG/ML IJ SOLN
1.0000 mg | Freq: Once | INTRAMUSCULAR | Status: AC
  Administered 2012-05-11: 1 mg via INTRAVENOUS
  Filled 2012-05-11: qty 1

## 2012-05-11 MED ORDER — ONDANSETRON HCL 4 MG/2ML IJ SOLN
4.0000 mg | Freq: Once | INTRAMUSCULAR | Status: AC
  Administered 2012-05-11: 4 mg via INTRAVENOUS
  Filled 2012-05-11: qty 2

## 2012-05-11 MED ORDER — SODIUM CHLORIDE 0.9 % IV SOLN: INTRAVENOUS | Status: DC

## 2012-05-11 MED ORDER — DICYCLOMINE HCL 10 MG/ML IM SOLN
20.0000 mg | Freq: Once | INTRAMUSCULAR | Status: AC
  Administered 2012-05-11: 20 mg via INTRAMUSCULAR
  Filled 2012-05-11: qty 2

## 2012-05-11 MED ORDER — SODIUM CHLORIDE 0.9 % IV BOLUS (SEPSIS)
1000.0000 mL | Freq: Once | INTRAVENOUS | Status: AC
  Administered 2012-05-11: 1000 mL via INTRAVENOUS

## 2012-05-11 NOTE — ED Notes (Signed)
Pt c/o generalized abd pain with N/V/D starting this am; pt sts hx of same with IBS

## 2012-05-11 NOTE — ED Notes (Signed)
Spoke with greg in resp. He is aware of abg

## 2012-05-11 NOTE — ED Provider Notes (Signed)
History     CSN: [id number]  Arrival date & time 05/11/12  1025   First MD Initiated Contact with Patient 05/11/12 1131      Chief Complaint  Patient presents with  . Emesis  . Diarrhea  . Abdominal Pain    (Consider location/radiation/quality/duration/timing/severity/associated sxs/prior treatment) HPI Complains of crampy epigastric pain , nonradiating severe and crampy . 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 No treatment prior to coming here as she was unable to hold down her medications. No fever no other associated symptoms. Pain typical of irritable bowel syndrome episodes she's had in the past Past Medical History  Diagnosis Date  . Hiatal hernia   . IBS (irritable bowel syndrome)   . Diverticulitis     Past Surgical History  Procedure Date  . Abdominal hysterectomy     History reviewed. No pertinent family history.  History  Substance Use Topics  . Smoking status: Never Smoker   . Smokeless tobacco: Not on file  . Alcohol Use: No    OB History    Grav Para Term Preterm Abortions TAB SAB Ect Mult Living                  Review of Systems  Constitutional: Negative.   HENT: Negative.   Respiratory: Negative.   Cardiovascular: Negative.   Gastrointestinal: Positive for nausea, vomiting, abdominal pain and diarrhea.  Musculoskeletal: Negative.   Skin: Negative.   Neurological: Negative.   Hematological: Negative.   Psychiatric/Behavioral: Negative.     Allergies  Review of patient's allergies indicates no known allergies.  Home Medications   Current Outpatient Rx  Name Route Sig Dispense Refill  . CYCLOBENZAPRINE HCL 10 MG PO TABS Oral Take 10 mg by mouth 3 (three) times daily as needed. For muscle spasms in back    . DICYCLOMINE HCL 20 MG PO TABS Oral Take 20 mg by mouth every 6 (six)  hours. For intestinal spasms    . DIPHENHYDRAMINE-PE-APAP 12.5-5-325 MG PO TABS Oral Take 1 tablet by mouth every 4 (four) hours as needed. For allergies    . DULOXETINE HCL 20 MG PO CPEP Oral Take 20 mg by mouth 2 (two) times daily.    Marland Kitchen FLUTICASONE PROPIONATE 50 MCG/ACT NA SUSP Nasal Place 2 sprays into the nose daily.    Marland Kitchen LAMOTRIGINE 150 MG PO TABS Oral Take 275 mg by mouth daily. Take 1 and 1/2 tablets daily    . ADULT MULTIVITAMIN W/MINERALS CH Oral Take 1 tablet by mouth daily.    Marland Kitchen OMEPRAZOLE 20 MG PO CPDR Oral Take 20 mg by mouth daily.    Marland Kitchen PERPHENAZINE 4 MG PO TABS Oral Take 4 mg by mouth 2 (two) times daily.    Marland Kitchen PROMETHAZINE HCL 25 MG PO TABS Oral Take 25 mg by mouth every 6 (six) hours as needed. For nausea/vomiting    . ZOLPIDEM TARTRATE 10 MG PO TABS Oral Take 10 mg by mouth at bedtime as needed. For sleep/depression      BP 172/86  Pulse 71  Temp 98.2 F (36.8 C) (Oral)  Resp 22  SpO2 98%  Physical Exam  Nursing note and vitals reviewed. Constitutional: She appears well-developed and well-nourished. She appears distressed.       Appears uncomfortable rocking back and forth on the bed  HENT:  Head: Normocephalic and  atraumatic.  Eyes: Conjunctivae are normal. Pupils are equal, round, and reactive to light.  Neck: Neck supple. No tracheal deviation present. No thyromegaly present.  Cardiovascular: Normal rate and regular rhythm.   No murmur heard. Pulmonary/Chest: Effort normal and breath sounds normal.  Abdominal: Soft. Bowel sounds are normal. She exhibits no distension. There is tenderness. There is no rebound and no guarding.       Tender at epigastrium  Musculoskeletal: Normal range of motion. She exhibits no edema and no tenderness.  Neurological: She is alert. Coordination normal.  Skin: Skin is warm and dry. No rash noted.  Psychiatric: She has a normal mood and affect.    ED Course  Procedures (including critical care time)  at 3:55 PM patient's pain  was under control after treatment with intravenous Dilaudid, and Phenergan and intramuscular Bentylhowever she continued to complain of severe nausea. Additional Phenergan ordered. At 5:15 PM patient states her pain is returning and she continues to complain of nausea and has vomited one time additionalDilaudid and Zofran ordered. Additional intravenous fluid ordered Labs Reviewed - No data to display No results found.   No diagnosis found.   Results for orders placed during the hospital encounter of 05/11/12  POCT I-STAT 3, BLOOD GAS (G3+)      Component Value Range   pH, Arterial 7.432  7.350 - 7.450   pCO2 arterial 33.6 (*) 35.0 - 45.0 mmHg   pO2, Arterial 73.0 (*) 80.0 - 100.0 mmHg   Bicarbonate 22.4  20.0 - 24.0 mEq/L   TCO2 23  0 - 100 mmol/L   O2 Saturation 95.0     Acid-base deficit 1.0  0.0 - 2.0 mmol/L   Collection site RADIAL, ALLEN'S TEST ACCEPTABLE     Drawn by Operator     Sample type ARTERIAL    POCT I-STAT, CHEM 8      Component Value Range   Sodium 139  135 - 145 mEq/L   Potassium 3.6  3.5 - 5.1 mEq/L   Chloride 106  96 - 112 mEq/L   BUN 6  6 - 23 mg/dL   Creatinine, Ser 8.29  0.50 - 1.10 mg/dL   Glucose, Bld 562 (*) 70 - 99 mg/dL   Calcium, Ion 1.30 (*) 1.12 - 1.23 mmol/L   TCO2 21  0 - 100 mmol/L   Hemoglobin 14.3  12.0 - 15.0 g/dL   HCT 86.5  78.4 - 69.6 %   No results found.  MDM  Patient signed out to Dr. Rubin Payor 5:15 PM Diagnosis #1 abdominal pain #2 nausea vomiting diarrhea        Doug Sou, MD 05/11/12 [phone]

## 2012-05-11 NOTE — ED Notes (Signed)
Lab at bedside to draw [facility]

## 2013-02-03 ENCOUNTER — Emergency Department (HOSPITAL_COMMUNITY): Payer: Medicare Other

## 2013-02-03 ENCOUNTER — Encounter (HOSPITAL_COMMUNITY): Payer: Self-pay | Admitting: Physical Medicine and Rehabilitation

## 2013-02-03 ENCOUNTER — Emergency Department (HOSPITAL_COMMUNITY)
Admission: EM | Admit: 2013-02-03 | Discharge: 2013-02-03 | Disposition: A | Discharge status: Home / Self Care | Payer: Medicare Other | Source: Home / Self Care | Attending: Emergency Medicine | Admitting: Emergency Medicine

## 2013-02-03 ENCOUNTER — Encounter (HOSPITAL_COMMUNITY): Payer: Self-pay | Admitting: *Deleted

## 2013-02-03 ENCOUNTER — Inpatient Hospital Stay (HOSPITAL_COMMUNITY)
Admission: EM | Admit: 2013-02-03 | Discharge: 2013-02-05 | DRG: 392 | Disposition: A | Discharge status: Home / Self Care | Payer: Medicare Other | Attending: Internal Medicine | Admitting: Internal Medicine

## 2013-02-03 DIAGNOSIS — R112 Nausea with vomiting, unspecified: Principal | ICD-10-CM | POA: Diagnosis present

## 2013-02-03 DIAGNOSIS — M542 Cervicalgia: Secondary | ICD-10-CM

## 2013-02-03 DIAGNOSIS — R1115 Cyclical vomiting syndrome unrelated to migraine: Secondary | ICD-10-CM

## 2013-02-03 DIAGNOSIS — K219 Gastro-esophageal reflux disease without esophagitis: Secondary | ICD-10-CM | POA: Diagnosis present

## 2013-02-03 DIAGNOSIS — F1410 Cocaine abuse, uncomplicated: Secondary | ICD-10-CM | POA: Diagnosis present

## 2013-02-03 DIAGNOSIS — R197 Diarrhea, unspecified: Secondary | ICD-10-CM | POA: Insufficient documentation

## 2013-02-03 DIAGNOSIS — Z79899 Other long term (current) drug therapy: Secondary | ICD-10-CM

## 2013-02-03 DIAGNOSIS — K449 Diaphragmatic hernia without obstruction or gangrene: Secondary | ICD-10-CM | POA: Diagnosis present

## 2013-02-03 DIAGNOSIS — K920 Hematemesis: Secondary | ICD-10-CM

## 2013-02-03 DIAGNOSIS — E876 Hypokalemia: Secondary | ICD-10-CM | POA: Diagnosis present

## 2013-02-03 DIAGNOSIS — Z8719 Personal history of other diseases of the digestive system: Secondary | ICD-10-CM | POA: Insufficient documentation

## 2013-02-03 DIAGNOSIS — G43909 Migraine, unspecified, not intractable, without status migrainosus: Secondary | ICD-10-CM

## 2013-02-03 DIAGNOSIS — D72829 Elevated white blood cell count, unspecified: Secondary | ICD-10-CM | POA: Diagnosis present

## 2013-02-03 DIAGNOSIS — F1210 Cannabis abuse, uncomplicated: Secondary | ICD-10-CM | POA: Diagnosis present

## 2013-02-03 DIAGNOSIS — F329 Major depressive disorder, single episode, unspecified: Secondary | ICD-10-CM

## 2013-02-03 DIAGNOSIS — R109 Unspecified abdominal pain: Secondary | ICD-10-CM | POA: Insufficient documentation

## 2013-02-03 DIAGNOSIS — F1420 Cocaine dependence, uncomplicated: Secondary | ICD-10-CM | POA: Diagnosis present

## 2013-02-03 DIAGNOSIS — IMO0002 Reserved for concepts with insufficient information to code with codable children: Secondary | ICD-10-CM | POA: Insufficient documentation

## 2013-02-03 DIAGNOSIS — A088 Other specified intestinal infections: Secondary | ICD-10-CM | POA: Diagnosis present

## 2013-02-03 DIAGNOSIS — K5732 Diverticulitis of large intestine without perforation or abscess without bleeding: Secondary | ICD-10-CM | POA: Diagnosis present

## 2013-02-03 DIAGNOSIS — F319 Bipolar disorder, unspecified: Secondary | ICD-10-CM | POA: Insufficient documentation

## 2013-02-03 DIAGNOSIS — K589 Irritable bowel syndrome without diarrhea: Secondary | ICD-10-CM | POA: Diagnosis present

## 2013-02-03 DIAGNOSIS — Z9079 Acquired absence of other genital organ(s): Secondary | ICD-10-CM

## 2013-02-03 DIAGNOSIS — F3289 Other specified depressive episodes: Secondary | ICD-10-CM

## 2013-02-03 DIAGNOSIS — N809 Endometriosis, unspecified: Secondary | ICD-10-CM

## 2013-02-03 HISTORY — DX: Bipolar disorder, unspecified: F31.9

## 2013-02-03 HISTORY — DX: Gastro-esophageal reflux disease without esophagitis: K21.9

## 2013-02-03 LAB — CBC
HCT: 42.9 % (ref 36.0–46.0)
Hemoglobin: 15.6 g/dL — ABNORMAL HIGH (ref 12.0–15.0)
MCH: 32.2 pg (ref 26.0–34.0)
MCHC: 36.4 g/dL — ABNORMAL HIGH (ref 30.0–36.0)
MCV: 88.6 fL (ref 78.0–100.0)
Platelets: 334 10*3/uL (ref 150–400)
RBC: 4.84 MIL/uL (ref 3.87–5.11)
RDW: 14.1 % (ref 11.5–15.5)
WBC: 12.7 10*3/uL — ABNORMAL HIGH (ref 4.0–10.5)

## 2013-02-03 LAB — HEPATIC FUNCTION PANEL
ALT: 30 U/L (ref 0–35)
AST: 30 U/L (ref 0–37)
Albumin: 4.4 g/dL (ref 3.5–5.2)
Total Protein: 7.9 g/dL (ref 6.0–8.3)

## 2013-02-03 LAB — BASIC METABOLIC PANEL
CO2: 21 mEq/L (ref 19–32)
Calcium: 8.1 mg/dL — ABNORMAL LOW (ref 8.4–10.5)
Creatinine, Ser: 0.54 mg/dL (ref 0.50–1.10)
GFR calc non Af Amer: >90 mL/min (ref >90)
Glucose, Bld: 119 mg/dL — ABNORMAL HIGH (ref 70–99)
Sodium: 133 mEq/L — ABNORMAL LOW (ref 135–145)

## 2013-02-03 LAB — URINALYSIS, ROUTINE W REFLEX MICROSCOPIC
Glucose, UA: NEGATIVE mg/dL
Specific Gravity, Urine: 1.016 (ref 1.005–1.030)
Urobilinogen, UA: 0.2 mg/dL (ref 0.0–1.0)

## 2013-02-03 LAB — URINE MICROSCOPIC-ADD ON

## 2013-02-03 LAB — POCT I-STAT, CHEM 8
BUN: 6 mg/dL (ref 6–23)
Creatinine, Ser: 0.6 mg/dL (ref 0.50–1.10)
Hemoglobin: 16 g/dL — ABNORMAL HIGH (ref 12.0–15.0)
Potassium: 3.3 mEq/L — ABNORMAL LOW (ref 3.5–5.1)
Sodium: 141 mEq/L (ref 135–145)

## 2013-02-03 LAB — RAPID URINE DRUG SCREEN, HOSP PERFORMED
Barbiturates: POSITIVE — AB
Cocaine: NOT DETECTED
Tetrahydrocannabinol: POSITIVE — AB

## 2013-02-03 MED ORDER — IOHEXOL 300 MG/ML  SOLN
50.0000 mL | Freq: Once | INTRAMUSCULAR | Status: AC | PRN
  Administered 2013-02-03: 50 mL via ORAL

## 2013-02-03 MED ORDER — MORPHINE SULFATE 2 MG/ML IJ SOLN: 2.0000 mg | Freq: Once | INTRAMUSCULAR | Status: DC

## 2013-02-03 MED ORDER — ONDANSETRON HCL 4 MG/2ML IJ SOLN
4.0000 mg | Freq: Once | INTRAMUSCULAR | Status: AC
  Administered 2013-02-03: 4 mg via INTRAVENOUS
  Filled 2013-02-03: qty 2

## 2013-02-03 MED ORDER — GI COCKTAIL ~~LOC~~
30.0000 mL | Freq: Once | ORAL | Status: AC
  Administered 2013-02-03: 30 mL via ORAL
  Filled 2013-02-03: qty 30

## 2013-02-03 MED ORDER — PROMETHAZINE HCL 25 MG/ML IJ SOLN
25.0000 mg | Freq: Once | INTRAMUSCULAR | Status: AC
  Administered 2013-02-03: 25 mg via INTRAVENOUS
  Filled 2013-02-03: qty 1

## 2013-02-03 MED ORDER — HYDROMORPHONE HCL PF 1 MG/ML IJ SOLN: 1.0000 mg | Freq: Once | INTRAMUSCULAR | Status: DC

## 2013-02-03 MED ORDER — SODIUM CHLORIDE 0.9 % IV SOLN
1000.0000 mL | INTRAVENOUS | Status: DC
  Administered 2013-02-03: 1000 mL via INTRAVENOUS

## 2013-02-03 MED ORDER — IOHEXOL 300 MG/ML  SOLN
100.0000 mL | Freq: Once | INTRAMUSCULAR | Status: AC | PRN
  Administered 2013-02-03: 100 mL via INTRAVENOUS

## 2013-02-03 MED ORDER — SODIUM CHLORIDE 0.9 % IV BOLUS (SEPSIS)
1000.0000 mL | Freq: Once | INTRAVENOUS | Status: AC
  Administered 2013-02-03: 1000 mL via INTRAVENOUS

## 2013-02-03 MED ORDER — MORPHINE SULFATE 4 MG/ML IJ SOLN
2.0000 mg | Freq: Once | INTRAMUSCULAR | Status: AC
  Administered 2013-02-03: 2 mg via INTRAVENOUS

## 2013-02-03 MED ORDER — METOCLOPRAMIDE HCL 5 MG/ML IJ SOLN
10.0000 mg | Freq: Once | INTRAMUSCULAR | Status: AC
  Administered 2013-02-03: 10 mg via INTRAVENOUS
  Filled 2013-02-03: qty 2

## 2013-02-03 MED ORDER — PROMETHAZINE HCL 25 MG RE SUPP: 25.0000 mg | Freq: Four times a day (QID) | RECTAL | Status: DC | PRN

## 2013-02-03 MED ORDER — SODIUM CHLORIDE 0.9 % IV SOLN
1000.0000 mL | Freq: Once | INTRAVENOUS | Status: AC
  Administered 2013-02-03: 1000 mL via INTRAVENOUS

## 2013-02-03 MED ORDER — POTASSIUM CHLORIDE CRYS ER 20 MEQ PO TBCR
20.0000 meq | EXTENDED_RELEASE_TABLET | Freq: Once | ORAL | Status: AC
  Administered 2013-02-03: 20 meq via ORAL
  Filled 2013-02-03: qty 1

## 2013-02-03 MED ORDER — DICYCLOMINE HCL 10 MG PO CAPS
10.0000 mg | ORAL_CAPSULE | Freq: Once | ORAL | Status: AC
  Administered 2013-02-03: 10 mg via ORAL
  Filled 2013-02-03: qty 1

## 2013-02-03 MED ORDER — MORPHINE SULFATE 4 MG/ML IJ SOLN
2.0000 mg | Freq: Once | INTRAMUSCULAR | Status: AC
  Administered 2013-02-03: 2 mg via INTRAVENOUS
  Filled 2013-02-03: qty 1

## 2013-02-03 MED ORDER — MORPHINE SULFATE 4 MG/ML IJ SOLN: 4.0000 mg | Freq: Once | INTRAMUSCULAR | Status: DC

## 2013-02-03 MED ORDER — LORAZEPAM 2 MG/ML IJ SOLN
1.0000 mg | Freq: Once | INTRAMUSCULAR | Status: AC
  Administered 2013-02-03: 1 mg via INTRAVENOUS
  Filled 2013-02-03: qty 1

## 2013-02-03 NOTE — ED Notes (Signed)
Pt reports 3 episodes of vomiting since DC this AM. Denies diarrhea at this time. Reports continued 7/10 epigastric pain. Denies filling rx for phenergan suppository.

## 2013-02-03 NOTE — ED Notes (Signed)
Pt still hasn't drank any contrast. States she "will try." Reports mild nausea at this time. VSS.

## 2013-02-03 NOTE — ED Provider Notes (Signed)
  Physical Exam  BP 179/105  Pulse 87  Temp(Src) 98.7 F (37.1 C) (Oral)  Resp 16  SpO2 96%  Physical Exam Patient has had persistent nausea despite multiple medications CT scan was performed to rule out pathology.  She has diverticulosis without evidence of diverticulitis limits.  The patient to the hospital for a retractable vomiting ED Course  Procedures  MDM Will admit patient to hospital      Arman Filter, NP 02/03/13 2306

## 2013-02-03 NOTE — ED Notes (Signed)
Patient transported to CT 

## 2013-02-03 NOTE — ED Provider Notes (Signed)
History     CSN: [id number]  Arrival date & time 02/03/13  5956   First MD Initiated Contact with Patient 02/03/13 [phone]      Chief Complaint  Patient presents with  . Nausea  . Vomiting  . Abdominal Pain    (Consider location/radiation/quality/duration/timing/severity/associated sxs/prior treatment) Patient is a 57 y.o. female presenting with abdominal pain. The history is provided by the patient. No language interpreter was used.  Abdominal Pain Associated symptoms: diarrhea, nausea and vomiting   Associated symptoms: no chest pain, no chills, no cough, no dysuria, no fatigue, no fever and no shortness of breath   Pt is a 57yo female with PMH of IBS, diverticulitis and GERD presenting with n/v/d associated with epigastric pain which started around 0100 this morning.  Pt states this feels like her normal episode of IBS.  States she came in sooner than last time because she was unable to keep down her phenergan or bentyl.  Vomit is non-bilious, non-blood.  Diarrhea is watery and yellow in color w/o blood. Denies fever but does mention she has had a head cold over the last few days.  Pt does not have a GI specialist but does f/u with Dr. Kathie Rhodes for her bentyl and phenergan.   Past Medical History  Diagnosis Date  . Hiatal hernia   . IBS (irritable bowel syndrome)   . Diverticulitis   . Bipolar affective disorder   . GERD (gastroesophageal reflux disease)     Past Surgical History  Procedure Laterality Date  . Abdominal hysterectomy    . Breast lumpectomy      No family history on file.  History  Substance Use Topics  . Smoking status: Never Smoker   . Smokeless tobacco: Not on file  . Alcohol Use: No    OB History   Grav Para Term Preterm Abortions TAB SAB Ect Mult Living                  Review of Systems  Constitutional: Negative for fever, chills and fatigue.  Respiratory: Negative for cough and shortness of breath.   Cardiovascular: Negative for chest pain.   Gastrointestinal: Positive for nausea, vomiting, abdominal pain and diarrhea. Negative for blood in stool.  Genitourinary: Negative for dysuria and flank pain.  Skin: Negative for rash.    Allergies  Review of patient's allergies indicates no known allergies.  Home Medications   Current Outpatient Rx  Name  Route  Sig  Dispense  Refill  . cyclobenzaprine (FLEXERIL) 10 MG tablet   Oral   Take 10 mg by mouth 3 (three) times daily as needed. For muscle spasms in back         . dicyclomine (BENTYL) 20 MG tablet   Oral   Take 20 mg by mouth every 6 (six) hours. For intestinal spasms         . diphenhydrAMINE (BENADRYL) 25 mg capsule   Oral   Take 25 mg by mouth every 6 (six) hours as needed. For allergies. (pollen, cat fur, cigarette smoke, dust).         . fluticasone (FLONASE) 50 MCG/ACT nasal spray   Nasal   Place 2 sprays into the nose daily.         Marland Kitchen lamoTRIgine (LAMICTAL) 150 MG tablet   Oral   Take 150 mg by mouth daily.          . Multiple Vitamin (MULITIVITAMIN WITH MINERALS) TABS   Oral   Take 1 tablet  by mouth daily.         Marland Kitchen omeprazole (PRILOSEC) 20 MG capsule   Oral   Take 20 mg by mouth daily.         Marland Kitchen perphenazine (TRILAFON) 4 MG tablet   Oral   Take 4 mg by mouth 3 (three) times daily.          . promethazine (PHENERGAN) 25 MG tablet   Oral   Take 25 mg by mouth every 6 (six) hours as needed. For nausea/vomiting         . pseudoephedrine (SUDAFED) 30 MG tablet   Oral   Take 30 mg by mouth every 4 (four) hours as needed. For allergies, (pollen, cat fur, cigarette smoke, dust)         . venlafaxine XR (EFFEXOR-XR) 75 MG 24 hr capsule   Oral   Take 75 mg by mouth daily.         . promethazine (PHENERGAN) 25 MG suppository   Rectal   Place 1 suppository (25 mg total) rectally every 6 (six) hours as needed for nausea.   12 each   0     BP 148/79  Pulse 80  Temp(Src) 97.9 F (36.6 C) (Oral)  Resp 19  Ht 5\' 6"  (1.676  m)  Wt 190 lb (86.183 kg)  BMI 30.68 kg/m2  SpO2 98%  Physical Exam  Nursing note and vitals reviewed. Constitutional: She appears well-developed and well-nourished.  Pt sitting upright on exam bed holding emesis bag, appears uncomfortable  HENT:  Head: Normocephalic and atraumatic.  Eyes: Conjunctivae are normal. No scleral icterus.  Neck: Normal range of motion.  Cardiovascular: Normal rate and regular rhythm.   Pulmonary/Chest: Effort normal and breath sounds normal. No respiratory distress. She has no wheezes. She has no rales. She exhibits no tenderness.  Abdominal: Soft. Bowel sounds are normal. She exhibits no distension and no mass. There is tenderness ( TTP epigastrium and LLQ). There is no rebound and no guarding.  Musculoskeletal: Normal range of motion.  Neurological: She is alert.  Skin: Skin is warm and dry. She is not diaphoretic.  Psychiatric: She has a normal mood and affect. Her behavior is normal. Judgment and thought content normal.    ED Course  Procedures (including critical care time)  Labs Reviewed  CBC - Abnormal; Notable for the following:    WBC 12.7 (*)    Hemoglobin 15.6 (*)    MCHC 36.4 (*)    All other components within normal limits  POCT I-STAT, CHEM 8 - Abnormal; Notable for the following:    Potassium 3.3 (*)    Glucose, Bld 149 (*)    Calcium, Ion 1.04 (*)    Hemoglobin 16.0 (*)    HCT 47.0 (*)    All other components within normal limits   Dg Abd Acute W/chest  02/03/2013  *RADIOLOGY REPORT*  Clinical Data: Nausea, vomiting and abdominal pain  ACUTE ABDOMEN SERIES (ABDOMEN 2 VIEW & CHEST 1 VIEW)  Comparison: 05/11/2012  Findings: Heart size is normal.  There is no pleural effusion or edema.  There is no airspace consolidation identified.  Moderate size hiatal hernia is again noted.  The bowel gas pattern appears nonobstructed.  No dilated loop of small bowel or air-fluid levels identified.  There are no abnormal abdominal or pelvic  calcifications.  IMPRESSION:  1.  Nonobstructive bowel gas pattern. 2.  Hiatal hernia.   Original Report Authenticated By: Signa Kell, M.D.  1. Nausea & vomiting   2. Diarrhea       MDM  Pt with PMH of IBS, diverticulitis, and GERD presenting with her "typical" IBS exacerbation with n/v/d since 0100 this morning.   Will give zofran, morphine, and bentyl IV.  Will get basic labs: CBC and chem 8 along with acute ab w/ chest to r/o obstruction.    11:54 AM K was 3.3, will give 20mg  of K-dur  11:54 AM Pt states still feels nauseous. Will try phenergan.  Labs and imaging not concerning for emergent process taking place at this time.  11:54 AM Pt states she still feels very nauseous, however has not vomited since stay in ED.  Will try Reglan.  11:54 AM Pt still stating she feels very nauseous.  Will give GI cocktail and likely discharge home soon to have pt f/u with PCP  11:54 AM Pt states she feels much better after GI cocktail.  Feels comfortable going home.    Rx: phenergan suppository    Vitals: unremarkable. Discharged in stable condition.    Discussed pt with attending during ED encounter.         Junius Finner, PA-C 02/03/13 1154

## 2013-02-03 NOTE — ED Notes (Signed)
Returned from xray

## 2013-02-03 NOTE — ED Notes (Signed)
Patient transported to X-ray 

## 2013-02-03 NOTE — ED Notes (Addendum)
Last emesis 0630, last diarrhea 0500. Continues with nausea & mid upper abd pain. Denies UTI Sx's, blood in stools or emesis, fever, chills. Nothing makes it feel better, vomiting makes it worse.  States unable to keep phenergan or bentyl down. "It's my IBS acting up, I've had this happen before. I need an IV & IV meds".

## 2013-02-03 NOTE — ED Notes (Signed)
Pt presents to department for evaluation of N/V and epigastric pain. Was seen this morning and discharged home. Now states she is unable to keep food/fluids down and can't stop vomiting. Also states several episodes of diarrhea. 7/10 pain at the time. Pt is conscious alert and oriented x4.

## 2013-02-03 NOTE — ED Provider Notes (Signed)
History     CSN: [id number]  Arrival date & time 02/03/13  1745   First MD Initiated Contact with Patient 02/03/13 1750      Chief Complaint  Patient presents with  . Emesis  . Diarrhea  . Abdominal Pain    (Consider location/radiation/quality/duration/timing/severity/associated sxs/prior treatment) HPI Patient was seen in the emergency department earlier today for onset of nausea vomiting and diarrhea.  Labs and plain film were within normal limits the patient was discharged home.  She states since returning home she's continued having nausea vomiting and can't keep any food or fluids down.  She reports epigastric discomfort without radiation.  She has a history of IBS reports this feels similar to her IBS symptoms.  Because of her ongoing vomiting she returns to ER for evaluation.  Her symptoms are mild to moderate in severity.  No fevers or chills.  No lower abdominal pain.  No urinary complaints.   Past Medical History  Diagnosis Date  . Hiatal hernia   . IBS (irritable bowel syndrome)   . Diverticulitis   . Bipolar affective disorder   . GERD (gastroesophageal reflux disease)     Past Surgical History  Procedure Laterality Date  . Abdominal hysterectomy    . Breast lumpectomy      No family history on file.  History  Substance Use Topics  . Smoking status: Never Smoker   . Smokeless tobacco: Not on file  . Alcohol Use: No    OB History   Grav Para Term Preterm Abortions TAB SAB Ect Mult Living                  Review of Systems  All other systems reviewed and are negative.    Allergies  Review of patient's allergies indicates no known allergies.  Home Medications   Current Outpatient Rx  Name  Route  Sig  Dispense  Refill  . cyclobenzaprine (FLEXERIL) 10 MG tablet   Oral   Take 10 mg by mouth 3 (three) times daily as needed for muscle spasms. For muscle spasms in back         . dicyclomine (BENTYL) 20 MG tablet   Oral   Take 20 mg by mouth  every 6 (six) hours. For intestinal spasms         . diphenhydrAMINE (BENADRYL) 25 mg capsule   Oral   Take 25 mg by mouth every 6 (six) hours as needed for itching. For allergies. (pollen, cat fur, cigarette smoke, dust).         . fluticasone (FLONASE) 50 MCG/ACT nasal spray   Nasal   Place 2 sprays into the nose daily.         Marland Kitchen lamoTRIgine (LAMICTAL) 150 MG tablet   Oral   Take 150 mg by mouth daily.          . Multiple Vitamin (MULITIVITAMIN WITH MINERALS) TABS   Oral   Take 1 tablet by mouth daily.         Marland Kitchen omeprazole (PRILOSEC) 20 MG capsule   Oral   Take 20 mg by mouth daily.         Marland Kitchen perphenazine (TRILAFON) 4 MG tablet   Oral   Take 4 mg by mouth 3 (three) times daily.          . promethazine (PHENERGAN) 25 MG tablet   Oral   Take 25 mg by mouth every 6 (six) hours as needed. For nausea/vomiting         .  pseudoephedrine (SUDAFED) 30 MG tablet   Oral   Take 30 mg by mouth every 4 (four) hours as needed. For allergies, (pollen, cat fur, cigarette smoke, dust)         . venlafaxine XR (EFFEXOR-XR) 75 MG 24 hr capsule   Oral   Take 75 mg by mouth daily.         . promethazine (PHENERGAN) 25 MG suppository   Rectal   Place 1 suppository (25 mg total) rectally every 6 (six) hours as needed for nausea.   12 each   0     BP 143/101  Pulse 92  Temp(Src) 98 F (36.7 C) (Oral)  Resp 16  SpO2 96%  Physical Exam  Nursing note and vitals reviewed. Constitutional: She is oriented to person, place, and time. She appears well-developed and well-nourished. No distress.  HENT:  Head: Normocephalic and atraumatic.  Eyes: EOM are normal.  Neck: Normal range of motion.  Cardiovascular: Normal rate, regular rhythm and normal heart sounds.   Pulmonary/Chest: Effort normal and breath sounds normal.  Abdominal: Soft. She exhibits no distension.   Mild epigastric tenderness without guarding or rebound  Musculoskeletal: Normal range of motion.   Neurological: She is alert and oriented to person, place, and time.  Skin: Skin is warm and dry.  Psychiatric: She has a normal mood and affect. Judgment normal.    ED Course  Procedures (including critical care time)  Labs Reviewed  URINALYSIS, ROUTINE W REFLEX MICROSCOPIC - Abnormal; Notable for the following:    APPearance CLOUDY (*)    Hgb urine dipstick MODERATE (*)    Protein, ur 30 (*)    Leukocytes, UA TRACE (*)    All other components within normal limits  URINE MICROSCOPIC-ADD ON - Abnormal; Notable for the following:    Squamous Epithelial / LPF FEW (*)    All other components within normal limits  HEPATIC FUNCTION PANEL  LIPASE, BLOOD   Dg Abd Acute W/chest  02/03/2013  *RADIOLOGY REPORT*  Clinical Data: Nausea, vomiting and abdominal pain  ACUTE ABDOMEN SERIES (ABDOMEN 2 VIEW & CHEST 1 VIEW)  Comparison: 05/11/2012  Findings: Heart size is normal.  There is no pleural effusion or edema.  There is no airspace consolidation identified.  Moderate size hiatal hernia is again noted.  The bowel gas pattern appears nonobstructed.  No dilated loop of small bowel or air-fluid levels identified.  There are no abnormal abdominal or pelvic calcifications.  IMPRESSION:  1.  Nonobstructive bowel gas pattern. 2.  Hiatal hernia.   Original Report Authenticated By: Signa Kell, M.D.      No diagnosis found.    MDM  Patient will need liver function tests as well as a lipase given the location of her pain.  Plain film this morning was normal.  CT scan to evaluate further.  Symptomatic treatment at this time.        Lyanne Co, MD 02/04/13 [phone]

## 2013-02-03 NOTE — H&P (Signed)
PCP:   Dorrene German, MD   Chief Complaint:  n/v  HPI: 57 yo female h/o bipolar/gerd/ibs comes in with one day of n/v and abd crampiness.  This is her second visit to ED today for not able to hold anything down.  No fevers.  abd crampiness but more so in epi area.  Some loose stools.  Says this is same as when her previous  Episodes of her ibs act up.  No blood in vomit or stool.  W/u in ED neg.  She does feel some better since second visit here.  No cp no sob.  Review of Systems:  Positive and negative as per HPI otherwise all other systems are negative  Past Medical History: Past Medical History  Diagnosis Date  . Hiatal hernia   . IBS (irritable bowel syndrome)   . Diverticulitis   . Bipolar affective disorder   . GERD (gastroesophageal reflux disease)    Past Surgical History  Procedure Laterality Date  . Abdominal hysterectomy    . Breast lumpectomy      Medications: Prior to Admission medications   Medication Sig Start Date End Date Taking? Authorizing Provider  cyclobenzaprine (FLEXERIL) 10 MG tablet Take 10 mg by mouth 3 (three) times daily as needed for muscle spasms. For muscle spasms in back   Yes Historical Provider, MD  dicyclomine (BENTYL) 20 MG tablet Take 20 mg by mouth every 6 (six) hours. For intestinal spasms   Yes Historical Provider, MD  diphenhydrAMINE (BENADRYL) 25 mg capsule Take 25 mg by mouth every 6 (six) hours as needed for itching. For allergies. (pollen, cat fur, cigarette smoke, dust).   Yes Historical Provider, MD  fluticasone (FLONASE) 50 MCG/ACT nasal spray Place 2 sprays into the nose daily.   Yes Historical Provider, MD  lamoTRIgine (LAMICTAL) 150 MG tablet Take 150 mg by mouth daily.    Yes Historical Provider, MD  Multiple Vitamin (MULITIVITAMIN WITH MINERALS) TABS Take 1 tablet by mouth daily.   Yes Historical Provider, MD  omeprazole (PRILOSEC) 20 MG capsule Take 20 mg by mouth daily.   Yes Historical Provider, MD  perphenazine  (TRILAFON) 4 MG tablet Take 4 mg by mouth 3 (three) times daily.    Yes Historical Provider, MD  promethazine (PHENERGAN) 25 MG tablet Take 25 mg by mouth every 6 (six) hours as needed. For nausea/vomiting   Yes Historical Provider, MD  pseudoephedrine (SUDAFED) 30 MG tablet Take 30 mg by mouth every 4 (four) hours as needed. For allergies, (pollen, cat fur, cigarette smoke, dust)   Yes Historical Provider, MD  venlafaxine XR (EFFEXOR-XR) 75 MG 24 hr capsule Take 75 mg by mouth daily.   Yes Historical Provider, MD  promethazine (PHENERGAN) 25 MG suppository Place 1 suppository (25 mg total) rectally every 6 (six) hours as needed for nausea. 02/03/13   Junius Finner, PA-C    Allergies:  No Known Allergies  Social History:  reports that she has never smoked. She does not have any smokeless tobacco history on file. She reports that she uses illicit drugs (Marijuana). She reports that she does not drink alcohol.  Family History: neg  Physical Exam: Filed Vitals:   02/03/13 1911 02/03/13 2000 02/03/13 2030 02/03/13 2100  BP: 166/94 156/104 177/105 179/105  Pulse: 82 83 84 87  Temp: 98.7 F (37.1 C)     TempSrc: Oral     Resp: 16     SpO2: 98% 97% 97% 96%   General appearance: alert, cooperative and  no distress Head: Normocephalic, without obvious abnormality, atraumatic Eyes: negative Nose: Nares normal. Septum midline. Mucosa normal. No drainage or sinus tenderness. Neck: no JVD and supple, symmetrical, trachea midline Lungs: clear to auscultation bilaterally Heart: regular rate and rhythm, S1, S2 normal, no murmur, click, rub or gallop Abdomen: soft, non-tender; bowel sounds normal; no masses,  no organomegaly Extremities: extremities normal, atraumatic, no cyanosis or edema Pulses: 2+ and symmetric Skin: Skin color, texture, turgor normal. No rashes or lesions Neurologic: Grossly normal    Labs on Admission:   Recent Labs  02/03/13 0840  NA 141  K 3.3*  CL 105  GLUCOSE  149*  BUN 6  CREATININE 0.60    Recent Labs  02/03/13 1825  AST 30  ALT 30  ALKPHOS 157*  BILITOT 0.3  PROT 7.9  ALBUMIN 4.4    Recent Labs  02/03/13 1825  LIPASE 20    Recent Labs  02/03/13 0827 02/03/13 0840  WBC 12.7*  --   HGB 15.6* 16.0*  HCT 42.9 47.0*  MCV 88.6  --   PLT 334  --    Radiological Exams on Admission: Ct Abdomen Pelvis W Contrast  02/03/2013  *RADIOLOGY REPORT*  Clinical Data: Abdominal pain, nausea, vomiting and diarrhea.  CT ABDOMEN AND PELVIS WITH CONTRAST  Technique:  Multidetector CT imaging of the abdomen and pelvis was performed following the standard protocol during bolus administration of intravenous contrast.  Contrast: OMNIPAQUE IOHEXOL 300 MG/ML  SOLN  Comparison: CT of the abdomen and pelvis performed 06/04/2011  Findings: A large relatively decompressed hiatal hernia is seen, with associated atelectasis.  Minimal hypodensities within the liver are nonspecific but appear grossly stable from 2012 and likely reflect cysts.  The liver is otherwise unremarkable in appearance.  The spleen is within normal limits.  The gallbladder is within normal limits.  The pancreas and adrenal glands are unremarkable.  The kidneys are unremarkable in appearance.  There is no evidence of hydronephrosis.  No renal or ureteral stones are seen.  No perinephric stranding is appreciated.  No free fluid is identified.  The small bowel is unremarkable in appearance.  The stomach is within normal limits.  No acute vascular abnormalities are seen.  Scattered calcification is noted along the abdominal aorta and its branches.  The appendix is normal in caliber, without evidence for appendicitis.  Scattered very mild diverticulosis is noted along the entirety of the colon, without evidence of acute diverticulitis.  The colon is otherwise unremarkable in appearance.  The bladder is mildly distended and grossly unremarkable.  The patient is status post hysterectomy.  No  suspicious adnexal masses are seen.  No inguinal lymphadenopathy is seen.  No acute osseous abnormalities are identified.  IMPRESSION:  1.  No acute abnormality seen within the abdomen or pelvis. 2.  Large relatively decompressed hiatal hernia again seen, with associated atelectasis. 3.  Likely small hepatic cysts, also grossly stable. 4.  Scattered calcification along the abdominal aorta and its branches. 5.  Scattered very mild diverticulosis along the entirety of the colon, without evidence of acute diverticulitis.   Original Report Authenticated By: Tonia Ghent, M.D.    Dg Abd Acute W/chest  02/03/2013  *RADIOLOGY REPORT*  Clinical Data: Nausea, vomiting and abdominal pain  ACUTE ABDOMEN SERIES (ABDOMEN 2 VIEW & CHEST 1 VIEW)  Comparison: 05/11/2012  Findings: Heart size is normal.  There is no pleural effusion or edema.  There is no airspace consolidation identified.  Moderate size hiatal hernia is  again noted.  The bowel gas pattern appears nonobstructed.  No dilated loop of small bowel or air-fluid levels identified.  There are no abnormal abdominal or pelvic calcifications.  IMPRESSION:  1.  Nonobstructive bowel gas pattern. 2.  Hiatal hernia.   Original Report Authenticated By: Signa Kell, M.D.     Assessment/Plan   57 yo female with n/v/d for about 24 hours h/o ibs/gerd Principal Problem:   N&V (nausea and vomiting) Active Problems:   COCAINE ABUSE, EPISODIC   IBS   Bipolar affective disorder   GERD (gastroesophageal reflux disease)  Ct abd pelvis no acute issues.  abd exam is benign.  Repeat bmp for tonight is pending but lfts/lipase are normal.  uds neg for cocaine.  Admits to occasional marijuana use.  Place on protonix/gi cocktail/zofran/bentyl.  Possible viral gastroenteritis.  Ivf.  Also ck trop to make sure not silent presentation of acs.  If neg place on med bed overnight obs status.  Full code.    Tarena Gockley A 02/03/2013, 11:58 PM

## 2013-02-03 NOTE — ED Notes (Signed)
C/o n/v/d, abd pain since last night

## 2013-02-04 DIAGNOSIS — F319 Bipolar disorder, unspecified: Secondary | ICD-10-CM

## 2013-02-04 DIAGNOSIS — K589 Irritable bowel syndrome without diarrhea: Secondary | ICD-10-CM

## 2013-02-04 LAB — CBC
MCH: 31.9 pg (ref 26.0–34.0)
Platelets: 324 10*3/uL (ref 150–400)
RBC: 4.73 MIL/uL (ref 3.87–5.11)
WBC: 15.6 10*3/uL — ABNORMAL HIGH (ref 4.0–10.5)

## 2013-02-04 LAB — BASIC METABOLIC PANEL
Calcium: 8.5 mg/dL (ref 8.4–10.5)
GFR calc Af Amer: >90 mL/min (ref >90)
GFR calc non Af Amer: >90 mL/min (ref >90)
Sodium: 135 mEq/L (ref 135–145)

## 2013-02-04 MED ORDER — VENLAFAXINE HCL ER 75 MG PO CP24
75.0000 mg | ORAL_CAPSULE | Freq: Every day | ORAL | Status: DC
  Administered 2013-02-04 – 2013-02-05 (×2): 75 mg via ORAL
  Filled 2013-02-04 (×2): qty 1

## 2013-02-04 MED ORDER — CYCLOBENZAPRINE HCL 10 MG PO TABS: 10.0000 mg | ORAL_TABLET | Freq: Three times a day (TID) | ORAL | Status: DC | PRN

## 2013-02-04 MED ORDER — PERPHENAZINE 4 MG PO TABS
4.0000 mg | ORAL_TABLET | Freq: Three times a day (TID) | ORAL | Status: DC
  Administered 2013-02-04 – 2013-02-05 (×6): 4 mg via ORAL
  Filled 2013-02-04 (×7): qty 1

## 2013-02-04 MED ORDER — ONDANSETRON HCL 4 MG/2ML IJ SOLN
4.0000 mg | Freq: Four times a day (QID) | INTRAMUSCULAR | Status: DC | PRN
  Administered 2013-02-04 (×3): 4 mg via INTRAVENOUS
  Filled 2013-02-04 (×3): qty 2

## 2013-02-04 MED ORDER — LAMOTRIGINE 150 MG PO TABS
150.0000 mg | ORAL_TABLET | Freq: Every day | ORAL | Status: DC
  Administered 2013-02-04 – 2013-02-05 (×2): 150 mg via ORAL
  Filled 2013-02-04 (×2): qty 1

## 2013-02-04 MED ORDER — POTASSIUM CHLORIDE IN NACL 20-0.9 MEQ/L-% IV SOLN
INTRAVENOUS | Status: DC
  Administered 2013-02-04 (×2): via INTRAVENOUS
  Filled 2013-02-04 (×2): qty 1000

## 2013-02-04 MED ORDER — SODIUM CHLORIDE 0.9 % IV SOLN
INTRAVENOUS | Status: DC
  Administered 2013-02-04 – 2013-02-05 (×2): via INTRAVENOUS

## 2013-02-04 MED ORDER — CIPROFLOXACIN IN D5W 400 MG/200ML IV SOLN
400.0000 mg | Freq: Two times a day (BID) | INTRAVENOUS | Status: DC
  Administered 2013-02-04 – 2013-02-05 (×3): 400 mg via INTRAVENOUS
  Filled 2013-02-04 (×4): qty 200

## 2013-02-04 MED ORDER — POTASSIUM CHLORIDE 10 MEQ/100ML IV SOLN
10.0000 meq | INTRAVENOUS | Status: AC
  Administered 2013-02-04 (×2): 10 meq via INTRAVENOUS
  Filled 2013-02-04 (×2): qty 100

## 2013-02-04 MED ORDER — DICYCLOMINE HCL 20 MG PO TABS
20.0000 mg | ORAL_TABLET | Freq: Three times a day (TID) | ORAL | Status: DC
  Administered 2013-02-04 – 2013-02-05 (×7): 20 mg via ORAL
  Filled 2013-02-04 (×9): qty 1

## 2013-02-04 MED ORDER — ONDANSETRON HCL 4 MG PO TABS: 4.0000 mg | ORAL_TABLET | Freq: Four times a day (QID) | ORAL | Status: DC | PRN

## 2013-02-04 MED ORDER — TRAMADOL HCL 50 MG PO TABS
50.0000 mg | ORAL_TABLET | Freq: Four times a day (QID) | ORAL | Status: DC | PRN
  Administered 2013-02-04: 50 mg via ORAL
  Filled 2013-02-04 (×2): qty 1

## 2013-02-04 MED ORDER — MORPHINE SULFATE 2 MG/ML IJ SOLN
1.0000 mg | INTRAMUSCULAR | Status: DC | PRN
  Administered 2013-02-04 (×2): 1 mg via INTRAVENOUS
  Filled 2013-02-04 (×2): qty 1

## 2013-02-04 MED ORDER — PANTOPRAZOLE SODIUM 40 MG PO TBEC
40.0000 mg | DELAYED_RELEASE_TABLET | Freq: Every day | ORAL | Status: DC
  Administered 2013-02-04 – 2013-02-05 (×2): 40 mg via ORAL
  Filled 2013-02-04 (×2): qty 1

## 2013-02-04 MED ORDER — ENOXAPARIN SODIUM 40 MG/0.4ML ~~LOC~~ SOLN
40.0000 mg | Freq: Every day | SUBCUTANEOUS | Status: DC
  Administered 2013-02-04 – 2013-02-05 (×2): 40 mg via SUBCUTANEOUS
  Filled 2013-02-04 (×2): qty 0.4

## 2013-02-04 MED ORDER — METRONIDAZOLE IN NACL 5-0.79 MG/ML-% IV SOLN
500.0000 mg | Freq: Three times a day (TID) | INTRAVENOUS | Status: DC
  Administered 2013-02-04 – 2013-02-05 (×4): 500 mg via INTRAVENOUS
  Filled 2013-02-04 (×6): qty 100

## 2013-02-04 MED ORDER — PROMETHAZINE HCL 25 MG/ML IJ SOLN
12.5000 mg | Freq: Four times a day (QID) | INTRAMUSCULAR | Status: DC | PRN
  Filled 2013-02-04: qty 1

## 2013-02-04 MED ORDER — ALUM & MAG HYDROXIDE-SIMETH 200-200-20 MG/5ML PO SUSP: 30.0000 mL | Freq: Four times a day (QID) | ORAL | Status: DC | PRN

## 2013-02-04 NOTE — ED Provider Notes (Signed)
I was the primary provider of this patient during this ER visit. The patients care was continued in the [place] managed in conjunction with my midlevel providers.  Admitted the hospital despite normal CT scan of her abdomen for intractable vomiting   Lyanne Co, MD 02/04/13 [phone]

## 2013-02-04 NOTE — Progress Notes (Signed)
Patient reported pain of 9/10, nauseated, given zofran and patient reported no relief.  Patient sweating, requesting gown change, afebrile.  Will continue to monitor.  Macarthur Critchley, RN

## 2013-02-04 NOTE — Progress Notes (Signed)
[facility] PROGRESS NOTE  [name]:[id number] DOB: 1956-06-26 DOA: 02/03/2013 PCP: Dorrene German, MD  Assessment/Plan: N&V (nausea and vomiting) - PRN zofran/phenergan, add cipro/flagyl- CT scan negative- prob viral gastroenteritis  COCAINE ABUSE, EPISODIC  IBS   Bipolar affective disorder   GERD (gastroesophageal reflux disease)  Hypokalemia- replete  Leukocytosis- monitor  Code Status: full Family Communication: family at bedside Disposition Plan: home soon   Consultants:  none  Procedures:  none  Antibiotics:  cipro/flagyl  HPI/Subjective: Still having nausea/vomiting  Objective: Filed Vitals:   02/04/13 0010 02/04/13 0055 02/04/13 0114 02/04/13 0646  BP: 170/97 175/97  168/98  Pulse:  83  87  Temp:  97.4 F (36.3 C)  98.1 F (36.7 C)  TempSrc:      Resp:  18  18  Height:   5\' 6"  (1.676 m)   Weight:   82 kg (180 lb 12.4 oz)   SpO2:  97%  97%    Intake/Output Summary (Last 24 hours) at 02/04/13 1336 Last data filed at 02/04/13 0930  Gross per 24 hour  Intake      0 ml  Output      0 ml  Net      0 ml   Filed Weights   02/04/13 0114  Weight: 82 kg (180 lb 12.4 oz)    Exam:   General:  NAD, A+Ox3  Cardiovascular: rrr  Respiratory: clear  Abdomen: +BS, soft, NT  Musculoskeletal: moves all 4 ext  Data Reviewed: Basic Metabolic Panel:  Recent Labs Lab 02/03/13 0840 02/03/13 2304 02/04/13 0205  NA 141 133* 135  K 3.3* 3.4* 3.3*  CL 105 101 100  CO2  --  21 21  GLUCOSE 149* 119* 120*  BUN 6 7 7   CREATININE 0.60 0.54 0.51  CALCIUM  --  8.1* 8.5   Liver Function Tests:  Recent Labs Lab 02/03/13 1825  AST 30  ALT 30  ALKPHOS 157*  BILITOT 0.3  PROT 7.9  ALBUMIN 4.4    Recent Labs Lab 02/03/13 1825  LIPASE 20   No results found for this basename: AMMONIA,  in the last 168 hours CBC:  Recent Labs Lab 02/03/13 0827 02/03/13 0840 02/04/13 0205  WBC 12.7*  --  15.6*  HGB 15.6* 16.0*  15.1*  HCT 42.9 47.0* 42.1  MCV 88.6  --  89.0  PLT 334  --  324   Cardiac Enzymes: No results found for this basename: CKTOTAL, CKMB, CKMBINDEX, TROPONINI,  in the last 168 hours BNP (last 3 results) No results found for this basename: PROBNP,  in the last 8760 hours CBG: No results found for this basename: GLUCAP,  in the last 168 hours  No results found for this or any previous visit (from the past 240 hour(s)).   Studies: Ct Abdomen Pelvis W Contrast  02/03/2013  *RADIOLOGY REPORT*  Clinical Data: Abdominal pain, nausea, vomiting and diarrhea.  CT ABDOMEN AND PELVIS WITH CONTRAST  Technique:  Multidetector CT imaging of the abdomen and pelvis was performed following the standard protocol during bolus administration of intravenous contrast.  Contrast: OMNIPAQUE IOHEXOL 300 MG/ML  SOLN  Comparison: CT of the abdomen and pelvis performed 06/04/2011  Findings: A large relatively decompressed hiatal hernia is seen, with associated atelectasis.  Minimal hypodensities within the liver are nonspecific but appear grossly stable from 2012 and likely reflect cysts.  The liver is otherwise unremarkable in appearance.  The spleen is within normal limits.  The gallbladder  is within normal limits.  The pancreas and adrenal glands are unremarkable.  The kidneys are unremarkable in appearance.  There is no evidence of hydronephrosis.  No renal or ureteral stones are seen.  No perinephric stranding is appreciated.  No free fluid is identified.  The small bowel is unremarkable in appearance.  The stomach is within normal limits.  No acute vascular abnormalities are seen.  Scattered calcification is noted along the abdominal aorta and its branches.  The appendix is normal in caliber, without evidence for appendicitis.  Scattered very mild diverticulosis is noted along the entirety of the colon, without evidence of acute diverticulitis.  The colon is otherwise unremarkable in appearance.  The bladder is mildly  distended and grossly unremarkable.  The patient is status post hysterectomy.  No suspicious adnexal masses are seen.  No inguinal lymphadenopathy is seen.  No acute osseous abnormalities are identified.  IMPRESSION:  1.  No acute abnormality seen within the abdomen or pelvis. 2.  Large relatively decompressed hiatal hernia again seen, with associated atelectasis. 3.  Likely small hepatic cysts, also grossly stable. 4.  Scattered calcification along the abdominal aorta and its branches. 5.  Scattered very mild diverticulosis along the entirety of the colon, without evidence of acute diverticulitis.   Original Report Authenticated By: Tonia Ghent, M.D.    Dg Abd Acute W/chest  02/03/2013  *RADIOLOGY REPORT*  Clinical Data: Nausea, vomiting and abdominal pain  ACUTE ABDOMEN SERIES (ABDOMEN 2 VIEW & CHEST 1 VIEW)  Comparison: 05/11/2012  Findings: Heart size is normal.  There is no pleural effusion or edema.  There is no airspace consolidation identified.  Moderate size hiatal hernia is again noted.  The bowel gas pattern appears nonobstructed.  No dilated loop of small bowel or air-fluid levels identified.  There are no abnormal abdominal or pelvic calcifications.  IMPRESSION:  1.  Nonobstructive bowel gas pattern. 2.  Hiatal hernia.   Original Report Authenticated By: Signa Kell, M.D.     Scheduled Meds: . ciprofloxacin  400 mg Intravenous Q12H  . dicyclomine  20 mg Oral TID AC & HS  . enoxaparin (LOVENOX) injection  40 mg Subcutaneous Daily  . lamoTRIgine  150 mg Oral Daily  . metronidazole  500 mg Intravenous Q8H  . pantoprazole  40 mg Oral Daily  . perphenazine  4 mg Oral TID  . venlafaxine XR  75 mg Oral Daily   Continuous Infusions: . 0.9 % NaCl with KCl 20 mEq / L 100 mL/hr at 02/04/13 1210    Principal Problem:   N&V (nausea and vomiting) Active Problems:   COCAINE ABUSE, EPISODIC   IBS   Bipolar affective disorder   GERD (gastroesophageal reflux disease)    Time spent:  35    [facility], [place]  [facility] Pager [phone]. If 7PM-7AM, please contact night-coverage at [email or web address], password [email or web address] 02/04/2013, 1:36 PM  LOS: 1 day

## 2013-02-05 MED ORDER — CIPROFLOXACIN HCL 500 MG PO TABS: 500.0000 mg | ORAL_TABLET | Freq: Two times a day (BID) | ORAL | Status: DC

## 2013-02-05 MED ORDER — METRONIDAZOLE 500 MG PO TABS: 500.0000 mg | ORAL_TABLET | Freq: Three times a day (TID) | ORAL | Status: DC

## 2013-02-05 MED ORDER — PROMETHAZINE HCL 25 MG PO TABS: 12.5000 mg | ORAL_TABLET | Freq: Four times a day (QID) | ORAL | Status: DC | PRN

## 2013-02-05 MED ORDER — HYDROCODONE-ACETAMINOPHEN 5-325 MG PO TABS: 1.0000 | ORAL_TABLET | Freq: Four times a day (QID) | ORAL | Status: DC | PRN

## 2013-02-05 NOTE — Progress Notes (Signed)
Erin Hearing to be D/C'd Home per MD order.  Discussed with the patient and all questions fully answered.    Medication List    TAKE these medications       ciprofloxacin 500 MG tablet  Commonly known as:  CIPRO  Take 1 tablet (500 mg total) by mouth 2 (two) times daily.     cyclobenzaprine 10 MG tablet  Commonly known as:  FLEXERIL  Take 10 mg by mouth 3 (three) times daily as needed for muscle spasms. For muscle spasms in back     dicyclomine 20 MG tablet  Commonly known as:  BENTYL  Take 20 mg by mouth every 6 (six) hours. For intestinal spasms     diphenhydrAMINE 25 mg capsule  Commonly known as:  BENADRYL  Take 25 mg by mouth every 6 (six) hours as needed for itching. For allergies. (pollen, cat fur, cigarette smoke, dust).     fluticasone 50 MCG/ACT nasal spray  Commonly known as:  FLONASE  Place 2 sprays into the nose daily.     HYDROcodone-acetaminophen 5-325 MG per tablet  Commonly known as:  NORCO  Take 1 tablet by mouth every 6 (six) hours as needed for pain.     lamoTRIgine 150 MG tablet  Commonly known as:  LAMICTAL  Take 150 mg by mouth daily.     metroNIDAZOLE 500 MG tablet  Commonly known as:  FLAGYL  Take 1 tablet (500 mg total) by mouth 3 (three) times daily.     multivitamin with minerals Tabs  Take 1 tablet by mouth daily.     omeprazole 20 MG capsule  Commonly known as:  PRILOSEC  Take 20 mg by mouth daily.     perphenazine 4 MG tablet  Commonly known as:  TRILAFON  Take 4 mg by mouth 3 (three) times daily.     promethazine 25 MG tablet  Commonly known as:  PHENERGAN  Take 25 mg by mouth every 6 (six) hours as needed. For nausea/vomiting     promethazine 25 MG suppository  Commonly known as:  PHENERGAN  Place 1 suppository (25 mg total) rectally every 6 (six) hours as needed for nausea.     pseudoephedrine 30 MG tablet  Commonly known as:  SUDAFED  Take 30 mg by mouth every 4 (four) hours as needed. For allergies, (pollen, cat fur,  cigarette smoke, dust)     venlafaxine XR 75 MG 24 hr capsule  Commonly known as:  EFFEXOR-XR  Take 75 mg by mouth daily.        VVS, Skin clean, dry and intact without evidence of skin break down, no evidence of skin tears noted. IV catheter discontinued intact. Site without signs and symptoms of complications. Dressing and pressure applied.  An After Visit Summary was printed and given to the patient. Patient escorted via WC, and D/C home via private auto.  Kennyth Arnold D 02/05/2013 2:43 PM

## 2013-02-05 NOTE — Care Management Note (Signed)
    Page 1 of 1   02/05/2013     4:20:38 PM   CARE MANAGEMENT NOTE 02/05/2013  Patient:  [name], [name]   Account Number:  [id number]  Date Initiated:  02/04/2013  Documentation initiated by:  Letha Cape  Subjective/Objective Assessment:   dx gastroenteritis  admit- lives with family. pta indep.     Action/Plan:   Anticipated DC Date:  02/05/2013   Anticipated DC Plan:  HOME/SELF CARE      DC Planning Services  CM consult      Choice offered to / List presented to:             Status of service:  Completed, signed off Medicare Important Message given?   (If response is "NO", the following Medicare IM given date fields will be blank) Date Medicare IM given:   Date Additional Medicare IM given:    Discharge Disposition:  HOME/SELF CARE  Per UR Regulation:  Reviewed for med. necessity/level of care/duration of stay  If discussed at Long Length of Stay Meetings, dates discussed:    Comments:  02/05/13 16:19 Letha Cape RN, BSN [phone] patient lives with spouse, pta indep, patient is for dc today, no needs anticipated.

## 2013-02-05 NOTE — Discharge Summary (Signed)
Physician Discharge Summary  [name] YNW:[id number] DOB: January 29, 1956 DOA: 02/03/2013  PCP: Dorrene German, MD  Admit date: 02/03/2013 Discharge date: 02/05/2013  Time spent: 35 minutes  Recommendations for Outpatient Follow-up:  1. BMp in 1 week, CBC 1 week  Discharge Diagnoses:  Principal Problem:   N&V (nausea and vomiting) Active Problems:   COCAINE ABUSE, EPISODIC   IBS   Bipolar affective disorder   GERD (gastroesophageal reflux disease)   Discharge Condition: improved  Diet recommendation: advance as tolerated  Filed Weights   02/04/13 0114  Weight: 82 kg (180 lb 12.4 oz)    History of present illness:  57 yo female h/o bipolar/gerd/ibs comes in with one day of n/v and abd crampiness. This is her second visit to ED today for not able to hold anything down. No fevers. abd crampiness but more so in epi area. Some loose stools. Says this is same as when her previous Episodes of her ibs act up. No blood in vomit or stool. W/u in ED neg. She does feel some better since second visit here. No cp no sob.   Hospital Course:  N&V (nausea and vomiting) - PRN zofran/phenergan- eating regular meals at breakfast/lunch, add cipro/flagyl with improvement- CT scan negative- prob viral gastroenteritis vs beginning of diverticulitis  COCAINE ABUSE, EPISODIC   IBS   Bipolar affective disorder   GERD (gastroesophageal reflux disease)   Hypokalemia- replete   Leukocytosis- monitor   Procedures:  none  Consultations:  none  Discharge Exam: Filed Vitals:   02/04/13 1400 02/04/13 2029 02/05/13 0443 02/05/13 1347  BP: 157/99 141/97 130/88 127/89  Pulse: 78 95 84 82  Temp: 98.2 F (36.8 C) 98.5 F (36.9 C) 98.6 F (37 C) 98.9 F (37.2 C)  TempSrc: Oral Oral Oral Oral  Resp: 18 16 16 18   Height:      Weight:      SpO2: 98% 97% 96% 95%    General: A+Ox3, NAD Cardiovascular: rrr Respiratory: clear anterior  Discharge Instructions      Discharge Orders    Future Orders Complete By Expires     Diet general  As directed     Discharge instructions  As directed     Comments:      Advance diet slowly and as tolerated    Discharge instructions  As directed     Comments:      BMP 1 week    Increase activity slowly  As directed         Medication List    TAKE these medications       ciprofloxacin 500 MG tablet  Commonly known as:  CIPRO  Take 1 tablet (500 mg total) by mouth 2 (two) times daily.     cyclobenzaprine 10 MG tablet  Commonly known as:  FLEXERIL  Take 10 mg by mouth 3 (three) times daily as needed for muscle spasms. For muscle spasms in back     dicyclomine 20 MG tablet  Commonly known as:  BENTYL  Take 20 mg by mouth every 6 (six) hours. For intestinal spasms     diphenhydrAMINE 25 mg capsule  Commonly known as:  BENADRYL  Take 25 mg by mouth every 6 (six) hours as needed for itching. For allergies. (pollen, cat fur, cigarette smoke, dust).     fluticasone 50 MCG/ACT nasal spray  Commonly known as:  FLONASE  Place 2 sprays into the nose daily.     HYDROcodone-acetaminophen 5-325 MG per tablet  Commonly  known as:  NORCO  Take 1 tablet by mouth every 6 (six) hours as needed for pain.     lamoTRIgine 150 MG tablet  Commonly known as:  LAMICTAL  Take 150 mg by mouth daily.     metroNIDAZOLE 500 MG tablet  Commonly known as:  FLAGYL  Take 1 tablet (500 mg total) by mouth 3 (three) times daily.     multivitamin with minerals Tabs  Take 1 tablet by mouth daily.     omeprazole 20 MG capsule  Commonly known as:  PRILOSEC  Take 20 mg by mouth daily.     perphenazine 4 MG tablet  Commonly known as:  TRILAFON  Take 4 mg by mouth 3 (three) times daily.     promethazine 25 MG tablet  Commonly known as:  PHENERGAN  Take 25 mg by mouth every 6 (six) hours as needed. For nausea/vomiting     promethazine 25 MG suppository  Commonly known as:  PHENERGAN  Place 1 suppository (25 mg total) rectally every 6 (six) hours  as needed for nausea.     pseudoephedrine 30 MG tablet  Commonly known as:  SUDAFED  Take 30 mg by mouth every 4 (four) hours as needed. For allergies, (pollen, cat fur, cigarette smoke, dust)     venlafaxine XR 75 MG 24 hr capsule  Commonly known as:  EFFEXOR-XR  Take 75 mg by mouth daily.          The results of significant diagnostics from this hospitalization (including imaging, microbiology, ancillary and laboratory) are listed below for reference.    Significant Diagnostic Studies: Ct Abdomen Pelvis W Contrast  02/03/2013  *RADIOLOGY REPORT*  Clinical Data: Abdominal pain, nausea, vomiting and diarrhea.  CT ABDOMEN AND PELVIS WITH CONTRAST  Technique:  Multidetector CT imaging of the abdomen and pelvis was performed following the standard protocol during bolus administration of intravenous contrast.  Contrast: OMNIPAQUE IOHEXOL 300 MG/ML  SOLN  Comparison: CT of the abdomen and pelvis performed 06/04/2011  Findings: A large relatively decompressed hiatal hernia is seen, with associated atelectasis.  Minimal hypodensities within the liver are nonspecific but appear grossly stable from 2012 and likely reflect cysts.  The liver is otherwise unremarkable in appearance.  The spleen is within normal limits.  The gallbladder is within normal limits.  The pancreas and adrenal glands are unremarkable.  The kidneys are unremarkable in appearance.  There is no evidence of hydronephrosis.  No renal or ureteral stones are seen.  No perinephric stranding is appreciated.  No free fluid is identified.  The small bowel is unremarkable in appearance.  The stomach is within normal limits.  No acute vascular abnormalities are seen.  Scattered calcification is noted along the abdominal aorta and its branches.  The appendix is normal in caliber, without evidence for appendicitis.  Scattered very mild diverticulosis is noted along the entirety of the colon, without evidence of acute diverticulitis.  The colon  is otherwise unremarkable in appearance.  The bladder is mildly distended and grossly unremarkable.  The patient is status post hysterectomy.  No suspicious adnexal masses are seen.  No inguinal lymphadenopathy is seen.  No acute osseous abnormalities are identified.  IMPRESSION:  1.  No acute abnormality seen within the abdomen or pelvis. 2.  Large relatively decompressed hiatal hernia again seen, with associated atelectasis. 3.  Likely small hepatic cysts, also grossly stable. 4.  Scattered calcification along the abdominal aorta and its branches. 5.  Scattered very mild diverticulosis  along the entirety of the colon, without evidence of acute diverticulitis.   Original Report Authenticated By: Tonia Ghent, M.D.    Dg Abd Acute W/chest  02/03/2013  *RADIOLOGY REPORT*  Clinical Data: Nausea, vomiting and abdominal pain  ACUTE ABDOMEN SERIES (ABDOMEN 2 VIEW & CHEST 1 VIEW)  Comparison: 05/11/2012  Findings: Heart size is normal.  There is no pleural effusion or edema.  There is no airspace consolidation identified.  Moderate size hiatal hernia is again noted.  The bowel gas pattern appears nonobstructed.  No dilated loop of small bowel or air-fluid levels identified.  There are no abnormal abdominal or pelvic calcifications.  IMPRESSION:  1.  Nonobstructive bowel gas pattern. 2.  Hiatal hernia.   Original Report Authenticated By: Signa Kell, M.D.     Microbiology: No results found for this or any previous visit (from the past 240 hour(s)).   Labs: Basic Metabolic Panel:  Recent Labs Lab 02/03/13 0840 02/03/13 2304 02/04/13 0205  NA 141 133* 135  K 3.3* 3.4* 3.3*  CL 105 101 100  CO2  --  21 21  GLUCOSE 149* 119* 120*  BUN 6 7 7   CREATININE 0.60 0.54 0.51  CALCIUM  --  8.1* 8.5   Liver Function Tests:  Recent Labs Lab 02/03/13 1825  AST 30  ALT 30  ALKPHOS 157*  BILITOT 0.3  PROT 7.9  ALBUMIN 4.4    Recent Labs Lab 02/03/13 1825  LIPASE 20   No results found for this  basename: AMMONIA,  in the last 168 hours CBC:  Recent Labs Lab 02/03/13 0827 02/03/13 0840 02/04/13 0205  WBC 12.7*  --  15.6*  HGB 15.6* 16.0* 15.1*  HCT 42.9 47.0* 42.1  MCV 88.6  --  89.0  PLT 334  --  324   Cardiac Enzymes: No results found for this basename: CKTOTAL, CKMB, CKMBINDEX, TROPONINI,  in the last 168 hours BNP: BNP (last 3 results) No results found for this basename: PROBNP,  in the last 8760 hours CBG: No results found for this basename: GLUCAP,  in the last 168 hours     Signed:  Marlin Canary  [facility] 02/05/2013, 2:41 PM

## 2013-02-06 NOTE — ED Provider Notes (Signed)
Medical screening examination/treatment/procedure(s) were conducted as a shared visit with non-physician practitioner(s) and myself.  I personally evaluated the patient during the encounter Pt with nv. abd soft nt. Labs zofran.   Suzi Roots, MD 02/06/13 2140

## 2013-02-19 IMAGING — CR DG ANKLE COMPLETE 3+V*R*
3 series · 3 of 3 positions shown · non-contrast
Comparison: None.

CLINICAL DATA: Fall, pain and swelling

RIGHT ANKLE - COMPLETE 3+ VIEW

[t ankle joint ap right]
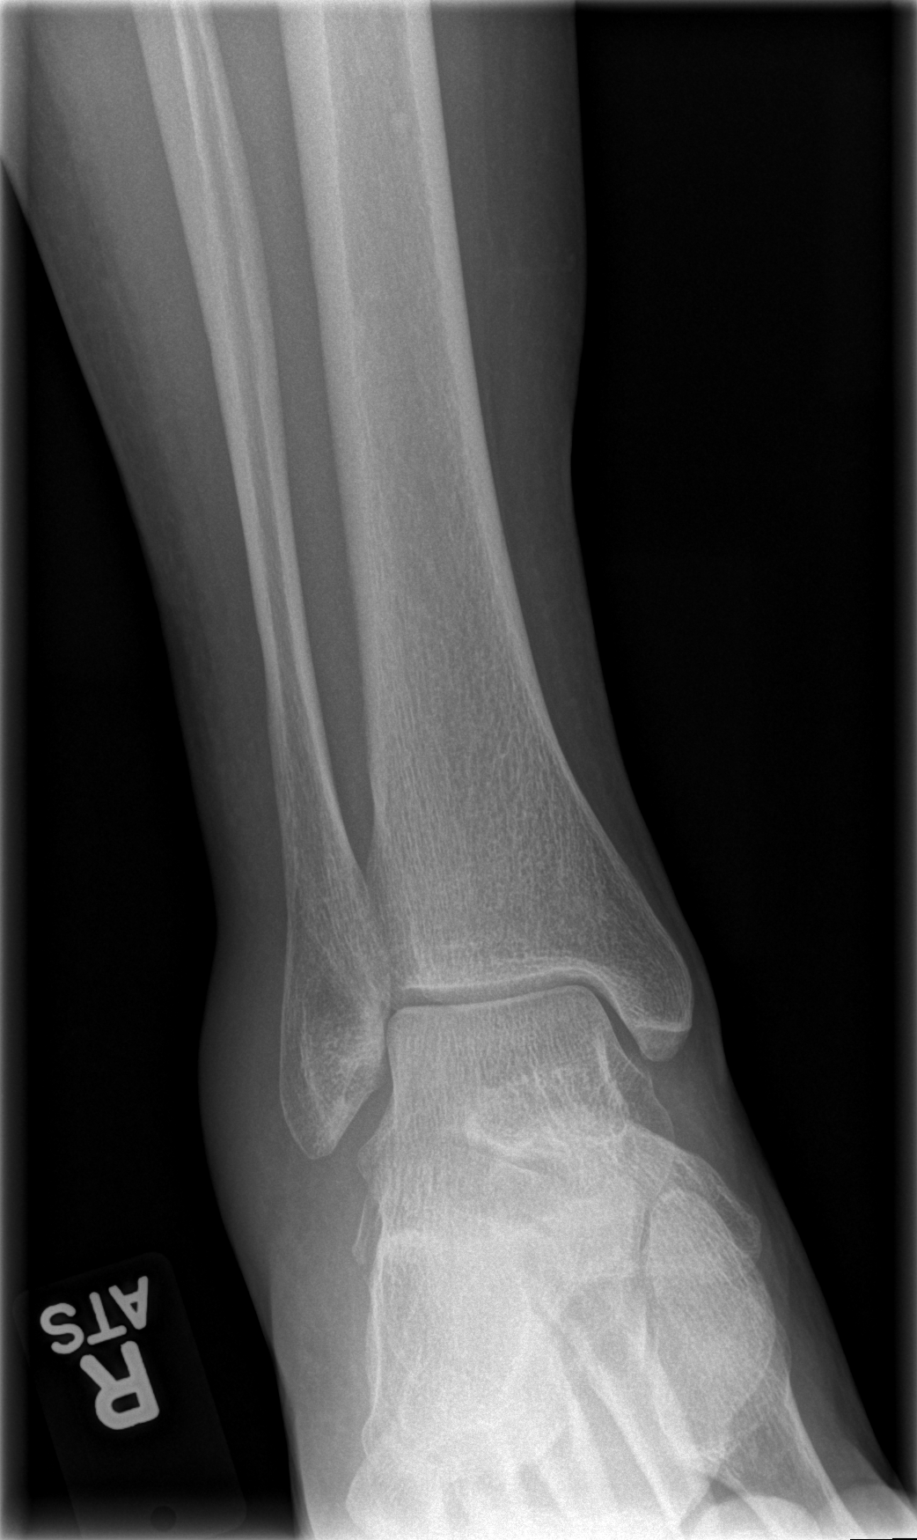

[t ankle joint oblique right]
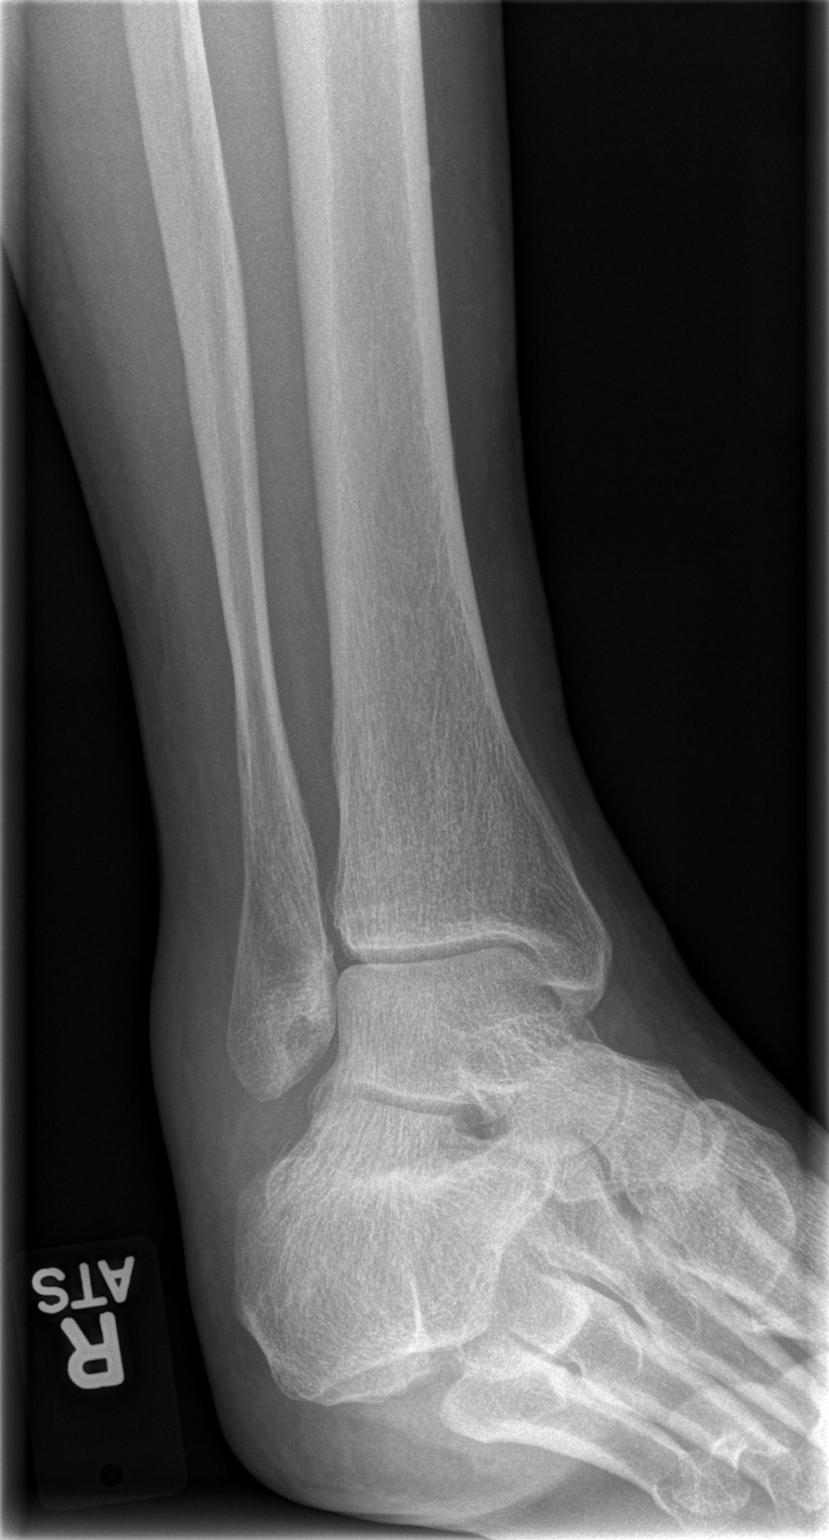

[t ankle joint lat right]
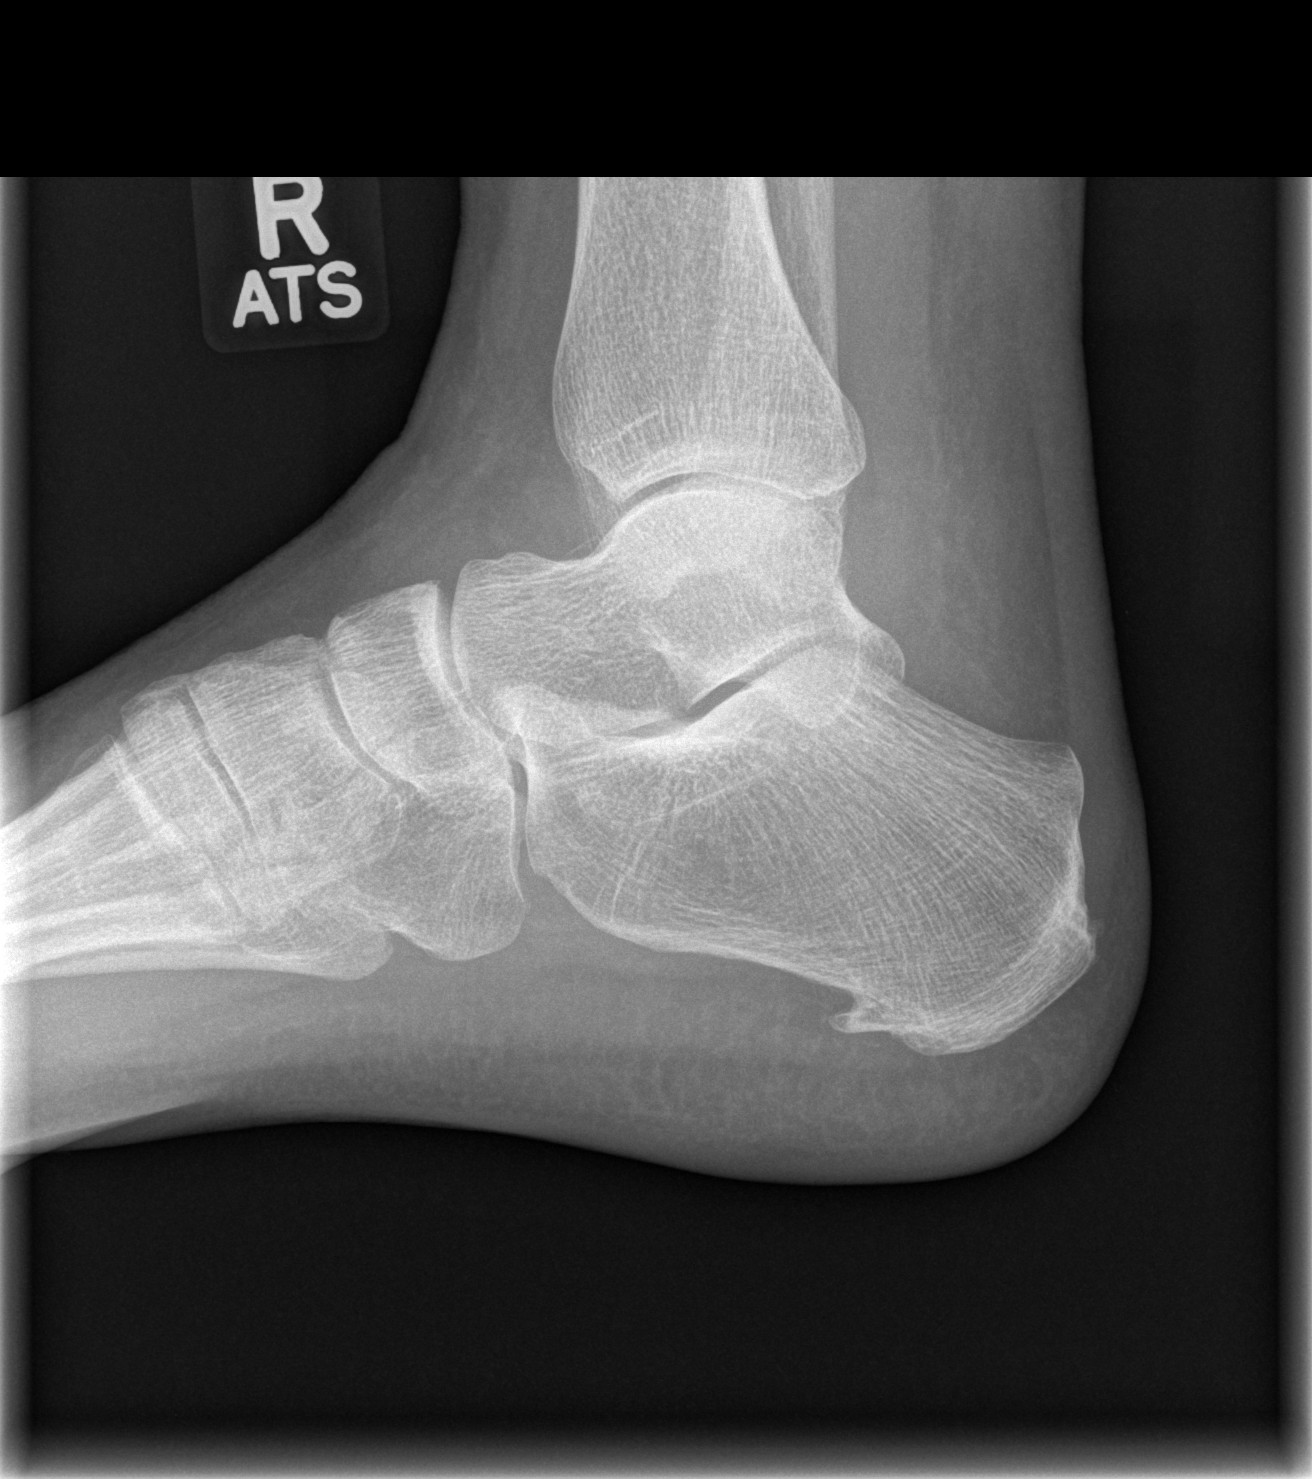

[3 of 3 positions shown; findings below may reference images not displayed]

FINDINGS: Normal alignment and no fracture.  Ankle joint appears
normal and there is no effusion.  Mild spurring of the calcaneus.
Lateral soft tissue swelling.
IMPRESSION: Negative for fracture.

## 2013-03-14 ENCOUNTER — Other Ambulatory Visit: Payer: Self-pay | Admitting: Obstetrics

## 2013-03-14 DIAGNOSIS — E8941 Symptomatic postprocedural ovarian failure: Secondary | ICD-10-CM

## 2013-03-26 ENCOUNTER — Ambulatory Visit
Admission: RE | Admit: 2013-03-26 | Discharge: 2013-03-26 | Disposition: A | Discharge status: Home / Self Care | Payer: Medicare Other | Source: Ambulatory Visit | Attending: Obstetrics | Admitting: Obstetrics

## 2013-03-26 DIAGNOSIS — E8940 Asymptomatic postprocedural ovarian failure: Secondary | ICD-10-CM

## 2013-03-26 DIAGNOSIS — E8941 Symptomatic postprocedural ovarian failure: Secondary | ICD-10-CM

## 2013-03-26 DIAGNOSIS — Z90710 Acquired absence of both cervix and uterus: Secondary | ICD-10-CM

## 2013-10-11 ENCOUNTER — Emergency Department (HOSPITAL_COMMUNITY)
Admission: EM | Admit: 2013-10-11 | Discharge: 2013-10-11 | Disposition: A | Discharge status: Home / Self Care | Payer: Medicare Other | Attending: Emergency Medicine | Admitting: Emergency Medicine

## 2013-10-11 ENCOUNTER — Emergency Department (HOSPITAL_COMMUNITY): Payer: Medicare Other

## 2013-10-11 ENCOUNTER — Encounter (HOSPITAL_COMMUNITY): Payer: Self-pay | Admitting: Emergency Medicine

## 2013-10-11 DIAGNOSIS — Z791 Long term (current) use of non-steroidal anti-inflammatories (NSAID): Secondary | ICD-10-CM | POA: Insufficient documentation

## 2013-10-11 DIAGNOSIS — IMO0002 Reserved for concepts with insufficient information to code with codable children: Secondary | ICD-10-CM | POA: Insufficient documentation

## 2013-10-11 DIAGNOSIS — Z90710 Acquired absence of both cervix and uterus: Secondary | ICD-10-CM | POA: Insufficient documentation

## 2013-10-11 DIAGNOSIS — R112 Nausea with vomiting, unspecified: Secondary | ICD-10-CM

## 2013-10-11 DIAGNOSIS — R1013 Epigastric pain: Secondary | ICD-10-CM | POA: Insufficient documentation

## 2013-10-11 DIAGNOSIS — Z8719 Personal history of other diseases of the digestive system: Secondary | ICD-10-CM | POA: Insufficient documentation

## 2013-10-11 DIAGNOSIS — F319 Bipolar disorder, unspecified: Secondary | ICD-10-CM | POA: Insufficient documentation

## 2013-10-11 DIAGNOSIS — Z79899 Other long term (current) drug therapy: Secondary | ICD-10-CM | POA: Insufficient documentation

## 2013-10-11 LAB — URINALYSIS, ROUTINE W REFLEX MICROSCOPIC
Bilirubin Urine: NEGATIVE
Glucose, UA: NEGATIVE mg/dL
Ketones, ur: NEGATIVE mg/dL
Specific Gravity, Urine: 1.017 (ref 1.005–1.030)
Urobilinogen, UA: 0.2 mg/dL (ref 0.0–1.0)

## 2013-10-11 LAB — COMPREHENSIVE METABOLIC PANEL
ALT: 23 U/L (ref 0–35)
BUN: 8 mg/dL (ref 6–23)
CO2: 25 mEq/L (ref 19–32)
Calcium: 9.6 mg/dL (ref 8.4–10.5)
Chloride: 103 mEq/L (ref 96–112)
Creatinine, Ser: 0.62 mg/dL (ref 0.50–1.10)
GFR calc Af Amer: >90 mL/min (ref >90)
GFR calc non Af Amer: >90 mL/min (ref >90)
Glucose, Bld: 125 mg/dL — ABNORMAL HIGH (ref 70–99)
Sodium: 140 mEq/L (ref 135–145)
Total Bilirubin: 0.3 mg/dL (ref 0.3–1.2)
Total Protein: 7.9 g/dL (ref 6.0–8.3)

## 2013-10-11 LAB — URINE MICROSCOPIC-ADD ON

## 2013-10-11 LAB — CBC WITH DIFFERENTIAL/PLATELET
Basophils Absolute: 0 10*3/uL (ref 0.0–0.1)
Eosinophils Absolute: 0 10*3/uL (ref 0.0–0.7)
Eosinophils Relative: 0 % (ref 0–5)
HCT: 45.4 % (ref 36.0–46.0)
Lymphocytes Relative: 12 % (ref 12–46)
Lymphs Abs: 1.5 10*3/uL (ref 0.7–4.0)
MCH: 33.4 pg (ref 26.0–34.0)
MCV: 93.6 fL (ref 78.0–100.0)
Monocytes Absolute: 0.3 10*3/uL (ref 0.1–1.0)
Monocytes Relative: 2 % — ABNORMAL LOW (ref 3–12)
Platelets: 349 10*3/uL (ref 150–400)
RBC: 4.85 MIL/uL (ref 3.87–5.11)
WBC: 11.9 10*3/uL — ABNORMAL HIGH (ref 4.0–10.5)

## 2013-10-11 LAB — LIPASE, BLOOD: Lipase: 13 U/L (ref 11–59)

## 2013-10-11 MED ORDER — SODIUM CHLORIDE 0.9 % IV BOLUS (SEPSIS)
1000.0000 mL | Freq: Once | INTRAVENOUS | Status: AC
  Administered 2013-10-11: 1000 mL via INTRAVENOUS

## 2013-10-11 MED ORDER — ONDANSETRON HCL 4 MG/2ML IJ SOLN
4.0000 mg | INTRAMUSCULAR | Status: AC
  Administered 2013-10-11: 4 mg via INTRAVENOUS
  Filled 2013-10-11: qty 2

## 2013-10-11 MED ORDER — METOCLOPRAMIDE HCL 5 MG/ML IJ SOLN
10.0000 mg | INTRAMUSCULAR | Status: AC
  Administered 2013-10-11: 10 mg via INTRAVENOUS
  Filled 2013-10-11: qty 2

## 2013-10-11 MED ORDER — GI COCKTAIL ~~LOC~~
30.0000 mL | Freq: Once | ORAL | Status: AC
  Administered 2013-10-11: 30 mL via ORAL
  Filled 2013-10-11: qty 30

## 2013-10-11 MED ORDER — PROMETHAZINE HCL 25 MG RE SUPP: 25.0000 mg | Freq: Four times a day (QID) | RECTAL | Status: DC | PRN

## 2013-10-11 MED ORDER — FENTANYL CITRATE 0.05 MG/ML IJ SOLN
50.0000 ug | Freq: Once | INTRAMUSCULAR | Status: AC
  Administered 2013-10-11: 50 ug via INTRAVENOUS
  Filled 2013-10-11: qty 2

## 2013-10-11 MED ORDER — MORPHINE SULFATE 4 MG/ML IJ SOLN
4.0000 mg | Freq: Once | INTRAMUSCULAR | Status: AC
  Administered 2013-10-11: 4 mg via INTRAVENOUS
  Filled 2013-10-11: qty 1

## 2013-10-11 MED ORDER — PROMETHAZINE HCL 25 MG/ML IJ SOLN
12.5000 mg | Freq: Once | INTRAMUSCULAR | Status: AC
  Administered 2013-10-11: 12.5 mg via INTRAVENOUS
  Filled 2013-10-11: qty 1

## 2013-10-11 NOTE — ED Notes (Signed)
Kelly, PA at the bedside.  

## 2013-10-11 NOTE — ED Provider Notes (Signed)
  Physical Exam  BP 137/82  Pulse 79  Temp(Src) 98.1 F (36.7 C) (Oral)  Resp 20  Ht 5\' 6"  (1.676 m)  Wt 190 lb (86.183 kg)  BMI 30.68 kg/m2  SpO2 98%  Physical Exam  ED Course  Procedures Patient is feeling better and would like to go home. She is tolerating orals.    Carlyle Dolly, PA-C 10/11/13 [phone]

## 2013-10-11 NOTE — ED Notes (Signed)
Given PO fluids for PO challenge.  

## 2013-10-11 NOTE — ED Notes (Signed)
Pt states she has vomited 6 times since 0400 today.  Pt states hx of same requiring hospital admission.

## 2013-10-11 NOTE — ED Provider Notes (Signed)
CSN: [id number]     Arrival date & time 10/11/13  1128 History   First MD Initiated Contact with Patient 10/11/13 1206     Chief Complaint  Patient presents with  . Emesis   (Consider location/radiation/quality/duration/timing/severity/associated sxs/prior Treatment) HPI Comments: Patient is a 57 year old female with a history of IBS and diverticulitis who presents today for nausea and emesis. Patient states the symptom onset was 9 hours ago. Patient states she has vomited 6 times since 4 AM all of which have been nonbloody and nonbilious. Patient's last episode of emesis was 2.5 hours ago, she states she still feels nauseous and intermittently experiences dry heaves. Symptoms associated with an abdominal discomfort in her epigastric region that she describes to be cramping in nature. Pain is nonradiating and worse and with emesis. Patient has tried Phenergan tabs at home for her symptoms without relief. She denies associated fever, chest pain, shortness of breath, hemoptysis or hematemesis, melena or hematochezia, urinary symptoms, vaginal complaints, numbness or tingling, and weakness.  Patient states she has had these symptoms in the past which have been attributed to her IBS, diverticulitis, or colitis. Patient states that symptoms usually resolve with IV antiemetics and fluids. Patient denies ever being followed by a gastroenterologist. Her last colonoscopy and endoscopy were in 2006. Abdominal surgical hx significant for abdominal total hysterectomy.  Patient is a 57 y.o. female presenting with vomiting. The history is provided by the patient. No language interpreter was used.  Emesis Associated symptoms: abdominal pain   Associated symptoms: no diarrhea     Past Medical History  Diagnosis Date  . Hiatal hernia   . IBS (irritable bowel syndrome)   . Diverticulitis   . Bipolar affective disorder   . GERD (gastroesophageal reflux disease)    Past Surgical History  Procedure Laterality  Date  . Abdominal hysterectomy    . Breast lumpectomy     No family history on file. History  Substance Use Topics  . Smoking status: Never Smoker   . Smokeless tobacco: Not on file  . Alcohol Use: No   OB History   Grav Para Term Preterm Abortions TAB SAB Ect Mult Living                 Review of Systems  Gastrointestinal: Positive for nausea, vomiting and abdominal pain. Negative for diarrhea and blood in stool.  All other systems reviewed and are negative.    Allergies  Review of patient's allergies indicates no known allergies.  Home Medications   Current Outpatient Rx  Name  Route  Sig  Dispense  Refill  . cyclobenzaprine (FLEXERIL) 10 MG tablet   Oral   Take 10 mg by mouth 3 (three) times daily as needed for muscle spasms. For muscle spasms in back         . diclofenac sodium (VOLTAREN) 1 % GEL   Topical   Apply 1 g topically daily. To back and elbow         . dicyclomine (BENTYL) 20 MG tablet   Oral   Take 20 mg by mouth 3 (three) times daily as needed. For intestinal spasms         . diphenhydrAMINE (SOMINEX) 25 MG tablet   Oral   Take 25 mg by mouth 3 (three) times daily as needed for allergies or sleep.         . fluticasone (FLONASE) 50 MCG/ACT nasal spray   Nasal   Place 2 sprays into the nose daily.         Marland Kitchen  ibuprofen (ADVIL,MOTRIN) 800 MG tablet   Oral   Take 800 mg by mouth every 8 (eight) hours as needed.         . lamoTRIgine (LAMICTAL) 150 MG tablet   Oral   Take 150 mg by mouth daily.          . Multiple Vitamin (MULITIVITAMIN WITH MINERALS) TABS   Oral   Take 1 tablet by mouth daily.         Marland Kitchen perphenazine (TRILAFON) 4 MG tablet   Oral   Take 4 mg by mouth 3 (three) times daily.          . pramipexole (MIRAPEX) 0.125 MG tablet   Oral   Take 0.125 mg by mouth at bedtime.         . promethazine (PHENERGAN) 25 MG tablet   Oral   Take 25 mg by mouth every 6 (six) hours as needed. For nausea/vomiting          . pseudoephedrine (SUDAFED) 30 MG tablet   Oral   Take 30 mg by mouth every 4 (four) hours as needed. For allergies, (pollen, cat fur, cigarette smoke, dust)         . venlafaxine XR (EFFEXOR-XR) 75 MG 24 hr capsule   Oral   Take 75 mg by mouth daily.          BP 170/103  Pulse 77  Temp(Src) 98.1 F (36.7 C) (Oral)  Resp 20  Ht 5\' 6"  (1.676 m)  Wt 190 lb (86.183 kg)  BMI 30.68 kg/m2  SpO2 100%  Physical Exam  Nursing note and vitals reviewed. Constitutional: She is oriented to person, place, and time. She appears well-developed and well-nourished. No distress.  HENT:  Head: Normocephalic and atraumatic.  Mouth/Throat: Oropharynx is clear and moist. No oropharyngeal exudate.  Eyes: Conjunctivae and EOM are normal. No scleral icterus.  Neck: Normal range of motion.  Cardiovascular: Normal rate, regular rhythm, normal heart sounds and intact distal pulses.   Distal radial pulses 2+ bilaterally  Pulmonary/Chest: Effort normal and breath sounds normal. No respiratory distress. She has no wheezes. She has no rales.  Abdominal: Soft. Bowel sounds are normal. She exhibits no distension. There is no tenderness. There is no rebound and no guarding.  No peritoneal signs or guarding appreciated  Musculoskeletal: Normal range of motion.  Neurological: She is alert and oriented to person, place, and time.  Skin: Skin is warm and dry. No rash noted. She is not diaphoretic. No erythema. No pallor.  Psychiatric: She has a normal mood and affect. Her behavior is normal.    ED Course  Procedures (including critical care time) Labs Review Labs Reviewed  URINALYSIS, ROUTINE W REFLEX MICROSCOPIC - Abnormal; Notable for the following:    APPearance TURBID (*)    Hgb urine dipstick MODERATE (*)    All other components within normal limits  CBC WITH DIFFERENTIAL - Abnormal; Notable for the following:    WBC 11.9 (*)    Hemoglobin 16.2 (*)    Neutrophils Relative % 85 (*)    Neutro Abs  10.1 (*)    Monocytes Relative 2 (*)    All other components within normal limits  COMPREHENSIVE METABOLIC PANEL - Abnormal; Notable for the following:    Glucose, Bld 125 (*)    Alkaline Phosphatase 163 (*)    All other components within normal limits  LIPASE, BLOOD  URINE MICROSCOPIC-ADD ON   Imaging Review No results found.  EKG Interpretation  None       MDM   1. Nausea and vomiting    Patient presents for persistent nausea and vomiting. She has a hx of IBS and diverticulitis. Patient nontoxic appearing, hemodynamically stable, and afebrile on arrival today. On exam today, heart RRR, lungs CTAB, and abdomen soft without notable TTP or peritoneal signs. Labs today are c/w priors. Repeat abdominal examinations stable without TTP.   Patient tx in ED with zofran, phenergan and IVF with some improvement in symptoms, however, patient still not feeling comfortable with outpatient management of symptoms at home. She does not wish for admission and believes her symptoms will improve enough for d/c. Have ordered IV Reglan for persistent nausea/dry heaves.  My suspicion for diverticulitis is low given lack of focal abdominal TTP or TTP in LLQ. Doubt cholecystitis in abscence of RUQ TTP with normal LFTs and no marked leukocytosis; alk phos chronically elevated. Also doubt pSBO or SBO, but will obtain acute abdominal series as patient has not had a BM since symptom onset. Patient with no additional pain complaints at this time.   Patient signed out to Ebbie Ridge, PA-C at shift change for further evaluation and symptomatic management. If no complications, anticipate d/c home with Rx for phenergan suppositories.    Antony Madura, PA-C 10/11/13 1553

## 2013-10-11 NOTE — ED Notes (Addendum)
Pt ambulating to BR without any difficulty. Able to keep PO fluids down

## 2013-10-12 NOTE — ED Provider Notes (Signed)
Medical screening examination/treatment/procedure(s) were performed by non-physician practitioner and as supervising physician I was immediately available for consultation/collaboration.  Trevin Gartrell L Bert Givans, MD 10/12/13 0014 

## 2013-10-12 NOTE — ED Provider Notes (Signed)
Medical screening examination/treatment/procedure(s) were performed by non-physician practitioner and as supervising physician I was immediately available for consultation/collaboration.  EKG Interpretation   None         Laray Anger, DO 10/12/13 1621

## 2013-11-16 ENCOUNTER — Encounter (HOSPITAL_COMMUNITY): Payer: Self-pay | Admitting: Emergency Medicine

## 2013-11-16 ENCOUNTER — Inpatient Hospital Stay (HOSPITAL_COMMUNITY)
Admission: EM | Admit: 2013-11-16 | Discharge: 2013-11-18 | DRG: 392 | Disposition: A | Discharge status: Home / Self Care | Payer: Medicare Other | Attending: Internal Medicine | Admitting: Internal Medicine

## 2013-11-16 DIAGNOSIS — F1420 Cocaine dependence, uncomplicated: Secondary | ICD-10-CM

## 2013-11-16 DIAGNOSIS — N809 Endometriosis, unspecified: Secondary | ICD-10-CM

## 2013-11-16 DIAGNOSIS — F319 Bipolar disorder, unspecified: Secondary | ICD-10-CM | POA: Diagnosis present

## 2013-11-16 DIAGNOSIS — Z9079 Acquired absence of other genital organ(s): Secondary | ICD-10-CM

## 2013-11-16 DIAGNOSIS — F329 Major depressive disorder, single episode, unspecified: Secondary | ICD-10-CM

## 2013-11-16 DIAGNOSIS — F3289 Other specified depressive episodes: Secondary | ICD-10-CM

## 2013-11-16 DIAGNOSIS — K219 Gastro-esophageal reflux disease without esophagitis: Secondary | ICD-10-CM

## 2013-11-16 DIAGNOSIS — G43909 Migraine, unspecified, not intractable, without status migrainosus: Secondary | ICD-10-CM

## 2013-11-16 DIAGNOSIS — Z23 Encounter for immunization: Secondary | ICD-10-CM

## 2013-11-16 DIAGNOSIS — R1115 Cyclical vomiting syndrome unrelated to migraine: Principal | ICD-10-CM | POA: Diagnosis present

## 2013-11-16 DIAGNOSIS — K449 Diaphragmatic hernia without obstruction or gangrene: Secondary | ICD-10-CM | POA: Diagnosis present

## 2013-11-16 DIAGNOSIS — F1411 Cocaine abuse, in remission: Secondary | ICD-10-CM

## 2013-11-16 DIAGNOSIS — K589 Irritable bowel syndrome without diarrhea: Secondary | ICD-10-CM | POA: Diagnosis present

## 2013-11-16 DIAGNOSIS — K920 Hematemesis: Secondary | ICD-10-CM

## 2013-11-16 DIAGNOSIS — E860 Dehydration: Secondary | ICD-10-CM | POA: Diagnosis present

## 2013-11-16 DIAGNOSIS — R112 Nausea with vomiting, unspecified: Secondary | ICD-10-CM

## 2013-11-16 DIAGNOSIS — M542 Cervicalgia: Secondary | ICD-10-CM

## 2013-11-16 DIAGNOSIS — K5900 Constipation, unspecified: Secondary | ICD-10-CM | POA: Diagnosis present

## 2013-11-16 DIAGNOSIS — E875 Hyperkalemia: Secondary | ICD-10-CM | POA: Diagnosis present

## 2013-11-16 DIAGNOSIS — D72829 Elevated white blood cell count, unspecified: Secondary | ICD-10-CM | POA: Diagnosis present

## 2013-11-16 DIAGNOSIS — E876 Hypokalemia: Secondary | ICD-10-CM

## 2013-11-16 LAB — CBC WITH DIFFERENTIAL/PLATELET
BASOS PCT: 0 % (ref 0–1)
Basophils Absolute: 0 10*3/uL (ref 0.0–0.1)
Eosinophils Absolute: 0 10*3/uL (ref 0.0–0.7)
Eosinophils Relative: 0 % (ref 0–5)
HCT: 45.4 % (ref 36.0–46.0)
Hemoglobin: 16.5 g/dL — ABNORMAL HIGH (ref 12.0–15.0)
Lymphocytes Relative: 14 % (ref 12–46)
Lymphs Abs: 1.6 10*3/uL (ref 0.7–4.0)
MCH: 33.5 pg (ref 26.0–34.0)
MCHC: 36.3 g/dL — ABNORMAL HIGH (ref 30.0–36.0)
MCV: 92.3 fL (ref 78.0–100.0)
MONOS PCT: 2 % — AB (ref 3–12)
Monocytes Absolute: 0.2 10*3/uL (ref 0.1–1.0)
NEUTROS ABS: 9.9 10*3/uL — AB (ref 1.7–7.7)
NEUTROS PCT: 84 % — AB (ref 43–77)
Platelets: 372 10*3/uL (ref 150–400)
RBC: 4.92 MIL/uL (ref 3.87–5.11)
RDW: 13.3 % (ref 11.5–15.5)
WBC: 11.8 10*3/uL — ABNORMAL HIGH (ref 4.0–10.5)

## 2013-11-16 LAB — COMPREHENSIVE METABOLIC PANEL
ALBUMIN: 4.1 g/dL (ref 3.5–5.2)
ALK PHOS: 151 U/L — AB (ref 39–117)
ALT: 21 U/L (ref 0–35)
AST: 50 U/L — AB (ref 0–37)
BILIRUBIN TOTAL: 0.3 mg/dL (ref 0.3–1.2)
BUN: 10 mg/dL (ref 6–23)
CHLORIDE: 97 meq/L (ref 96–112)
CO2: 18 mEq/L — ABNORMAL LOW (ref 19–32)
Calcium: 9.4 mg/dL (ref 8.4–10.5)
Creatinine, Ser: 0.56 mg/dL (ref 0.50–1.10)
GFR calc Af Amer: >90 mL/min (ref >90)
GFR calc non Af Amer: >90 mL/min (ref >90)
Glucose, Bld: 126 mg/dL — ABNORMAL HIGH (ref 70–99)
POTASSIUM: 5.8 meq/L — AB (ref 3.7–5.3)
Sodium: 135 mEq/L — ABNORMAL LOW (ref 137–147)
Total Protein: 8.3 g/dL (ref 6.0–8.3)

## 2013-11-16 LAB — CBC
HEMATOCRIT: 44.5 % (ref 36.0–46.0)
HEMOGLOBIN: 16.1 g/dL — AB (ref 12.0–15.0)
MCH: 33.2 pg (ref 26.0–34.0)
MCHC: 36.2 g/dL — AB (ref 30.0–36.0)
MCV: 91.8 fL (ref 78.0–100.0)
Platelets: 350 10*3/uL (ref 150–400)
RBC: 4.85 MIL/uL (ref 3.87–5.11)
RDW: 13.1 % (ref 11.5–15.5)
WBC: 10.1 10*3/uL (ref 4.0–10.5)

## 2013-11-16 LAB — TROPONIN I

## 2013-11-16 LAB — URINE MICROSCOPIC-ADD ON

## 2013-11-16 LAB — URINALYSIS, ROUTINE W REFLEX MICROSCOPIC
BILIRUBIN URINE: NEGATIVE
GLUCOSE, UA: NEGATIVE mg/dL
KETONES UR: 40 mg/dL — AB
Leukocytes, UA: NEGATIVE
Nitrite: NEGATIVE
PROTEIN: NEGATIVE mg/dL
Specific Gravity, Urine: 1.014 (ref 1.005–1.030)
Urobilinogen, UA: 0.2 mg/dL (ref 0.0–1.0)
pH: 7.5 (ref 5.0–8.0)

## 2013-11-16 LAB — LACTIC ACID, PLASMA: LACTIC ACID, VENOUS: 1.1 mmol/L (ref 0.5–2.2)

## 2013-11-16 LAB — MAGNESIUM: Magnesium: 1.7 mg/dL (ref 1.5–2.5)

## 2013-11-16 LAB — LIPASE, BLOOD: Lipase: 12 U/L (ref 11–59)

## 2013-11-16 LAB — POCT PREGNANCY, URINE: Preg Test, Ur: NEGATIVE

## 2013-11-16 MED ORDER — SODIUM CHLORIDE 0.9 % IV BOLUS (SEPSIS)
1000.0000 mL | Freq: Once | INTRAVENOUS | Status: AC
  Administered 2013-11-16: 1000 mL via INTRAVENOUS

## 2013-11-16 MED ORDER — ONDANSETRON HCL 4 MG PO TABS: 4.0000 mg | ORAL_TABLET | Freq: Four times a day (QID) | ORAL | Status: DC | PRN

## 2013-11-16 MED ORDER — KETOROLAC TROMETHAMINE 15 MG/ML IJ SOLN
15.0000 mg | Freq: Four times a day (QID) | INTRAMUSCULAR | Status: DC | PRN
  Administered 2013-11-17: 15 mg via INTRAVENOUS
  Filled 2013-11-16: qty 1

## 2013-11-16 MED ORDER — PROMETHAZINE HCL 25 MG/ML IJ SOLN
12.5000 mg | Freq: Three times a day (TID) | INTRAMUSCULAR | Status: DC | PRN
  Administered 2013-11-16 – 2013-11-17 (×2): 12.5 mg via INTRAVENOUS
  Filled 2013-11-16 (×3): qty 1

## 2013-11-16 MED ORDER — LAMOTRIGINE 150 MG PO TABS
150.0000 mg | ORAL_TABLET | Freq: Every day | ORAL | Status: DC
  Administered 2013-11-16 – 2013-11-18 (×3): 150 mg via ORAL
  Filled 2013-11-16 (×3): qty 1

## 2013-11-16 MED ORDER — MORPHINE SULFATE 4 MG/ML IJ SOLN
4.0000 mg | Freq: Once | INTRAMUSCULAR | Status: AC
  Administered 2013-11-16: 4 mg via INTRAVENOUS
  Filled 2013-11-16: qty 1

## 2013-11-16 MED ORDER — ONDANSETRON HCL 4 MG/2ML IJ SOLN
4.0000 mg | Freq: Once | INTRAMUSCULAR | Status: AC
  Administered 2013-11-16: 4 mg via INTRAVENOUS
  Filled 2013-11-16: qty 2

## 2013-11-16 MED ORDER — VENLAFAXINE HCL ER 75 MG PO CP24
75.0000 mg | ORAL_CAPSULE | Freq: Every day | ORAL | Status: DC
  Administered 2013-11-16 – 2013-11-18 (×3): 75 mg via ORAL
  Filled 2013-11-16 (×3): qty 1

## 2013-11-16 MED ORDER — DICYCLOMINE HCL 20 MG PO TABS
20.0000 mg | ORAL_TABLET | Freq: Three times a day (TID) | ORAL | Status: DC
  Administered 2013-11-16 – 2013-11-18 (×7): 20 mg via ORAL
  Filled 2013-11-16 (×10): qty 1

## 2013-11-16 MED ORDER — GI COCKTAIL ~~LOC~~
30.0000 mL | Freq: Once | ORAL | Status: AC
  Administered 2013-11-16: 30 mL via ORAL
  Filled 2013-11-16: qty 30

## 2013-11-16 MED ORDER — METOCLOPRAMIDE HCL 5 MG/ML IJ SOLN
10.0000 mg | Freq: Once | INTRAMUSCULAR | Status: AC
  Administered 2013-11-16: 10 mg via INTRAVENOUS
  Filled 2013-11-16: qty 2

## 2013-11-16 MED ORDER — ACETAMINOPHEN 325 MG PO TABS: 650.0000 mg | ORAL_TABLET | Freq: Four times a day (QID) | ORAL | Status: DC | PRN

## 2013-11-16 MED ORDER — INFLUENZA VAC SPLIT QUAD 0.5 ML IM SUSP
0.5000 mL | INTRAMUSCULAR | Status: AC
  Administered 2013-11-17: 0.5 mL via INTRAMUSCULAR
  Filled 2013-11-16: qty 0.5

## 2013-11-16 MED ORDER — ENOXAPARIN SODIUM 40 MG/0.4ML ~~LOC~~ SOLN
40.0000 mg | SUBCUTANEOUS | Status: DC
  Administered 2013-11-16 – 2013-11-17 (×2): 40 mg via SUBCUTANEOUS
  Filled 2013-11-16 (×3): qty 0.4

## 2013-11-16 MED ORDER — PRAMIPEXOLE DIHYDROCHLORIDE 0.125 MG PO TABS
0.1250 mg | ORAL_TABLET | Freq: Every day | ORAL | Status: DC
  Administered 2013-11-16 – 2013-11-17 (×2): 0.125 mg via ORAL
  Filled 2013-11-16 (×3): qty 1

## 2013-11-16 MED ORDER — ONDANSETRON HCL 4 MG/2ML IJ SOLN
4.0000 mg | Freq: Four times a day (QID) | INTRAMUSCULAR | Status: DC | PRN
  Administered 2013-11-17 (×2): 4 mg via INTRAVENOUS
  Filled 2013-11-16 (×2): qty 2

## 2013-11-16 MED ORDER — HYDROMORPHONE HCL PF 1 MG/ML IJ SOLN
0.5000 mg | Freq: Once | INTRAMUSCULAR | Status: AC
  Administered 2013-11-16: 0.5 mg via INTRAVENOUS
  Filled 2013-11-16: qty 1

## 2013-11-16 MED ORDER — FLUTICASONE PROPIONATE 50 MCG/ACT NA SUSP
2.0000 | Freq: Every day | NASAL | Status: DC
  Administered 2013-11-17 – 2013-11-18 (×2): 2 via NASAL
  Filled 2013-11-16: qty 16

## 2013-11-16 MED ORDER — CYCLOBENZAPRINE HCL 10 MG PO TABS: 10.0000 mg | ORAL_TABLET | Freq: Three times a day (TID) | ORAL | Status: DC | PRN

## 2013-11-16 MED ORDER — PROMETHAZINE HCL 25 MG/ML IJ SOLN
25.0000 mg | Freq: Once | INTRAMUSCULAR | Status: AC
  Administered 2013-11-16: 25 mg via INTRAVENOUS
  Filled 2013-11-16: qty 1

## 2013-11-16 MED ORDER — ACETAMINOPHEN 650 MG RE SUPP: 650.0000 mg | Freq: Four times a day (QID) | RECTAL | Status: DC | PRN

## 2013-11-16 MED ORDER — SODIUM CHLORIDE 0.9 % IJ SOLN
3.0000 mL | Freq: Two times a day (BID) | INTRAMUSCULAR | Status: DC
  Administered 2013-11-17: 3 mL via INTRAVENOUS

## 2013-11-16 NOTE — ED Notes (Signed)
Pt states she wants pain medication prior to having EKG. Provider informed

## 2013-11-16 NOTE — ED Notes (Signed)
EDP at the bedside.  ?

## 2013-11-16 NOTE — ED Notes (Signed)
Pt given a sprite. No vomiting noted at this time.

## 2013-11-16 NOTE — ED Notes (Signed)
Pt with c/o vomiting that started yesterday.  Pt states she takes Phenergan at home and was not able to control vomiting with the med.  Pt states she is not making saliva.

## 2013-11-16 NOTE — Progress Notes (Signed)
Pt admitted to the unit and placed on telemetry. Pt is stable, alert and oriented per baseline. Oriented to room, staff, and call bell. Educated to call for any assistance. Bed in lowest position, call bell within reach- will continue to monitor.

## 2013-11-16 NOTE — ED Provider Notes (Signed)
Complains of vague epigastric discomfort and multiple episodes of vomiting, onset 6 AM yesterday. Feels like irritable bowel syndrome she's had in the past.. No fever. Patient alert, in no distress.  Orlie Dakin, MD 11/16/13 1329

## 2013-11-16 NOTE — Progress Notes (Signed)
Patient states that she is in pain and would like RN to call MD for something. RN is paging on call MD now.

## 2013-11-16 NOTE — ED Provider Notes (Signed)
CSN: [id number]     Arrival date & time 11/16/13  1049 History   First MD Initiated Contact with Patient 11/16/13 1115     Chief Complaint  Patient presents with  . Emesis  . Abdominal Pain   (Consider location/radiation/quality/duration/timing/severity/associated sxs/prior Treatment) The history is provided by the patient. No language interpreter was used.  [name] is a 58 y/o F with PMHX of hiatal hernia, IBS, Bipolar, Diverticulitis, GERD presenting to the ED with epigastric pain, nausea, and vomiting. Patient reported that the abdominal pain started at approximately 6:00AM this morning. Patient reported that the pain is localized to the epigastric region described as a sharp shooting pain without radiation. Patient stated the pain worsens with dry heaving. Stated that she is unable to recall exactly, she has vomited - NB/NB Denied chest pain, shortness of breath, difficulty breathing, melena, hematochezia, diarrhea, fever, chills. PCP Dr. Jeanie Cooks  Past Medical History  Diagnosis Date  . Hiatal hernia   . IBS (irritable bowel syndrome)   . Diverticulitis   . Bipolar affective disorder   . GERD (gastroesophageal reflux disease)    Past Surgical History  Procedure Laterality Date  . Abdominal hysterectomy    . Breast lumpectomy     No family history on file. History  Substance Use Topics  . Smoking status: Never Smoker   . Smokeless tobacco: Not on file  . Alcohol Use: No   OB History   Grav Para Term Preterm Abortions TAB SAB Ect Mult Living                 Review of Systems  Constitutional: Negative for fever and chills.  Respiratory: Negative for chest tightness and shortness of breath.   Cardiovascular: Negative for chest pain.  Gastrointestinal: Positive for nausea, vomiting and abdominal pain. Negative for diarrhea, constipation, blood in stool and anal bleeding.  Musculoskeletal: Negative for back pain.  All other systems reviewed and are  negative.    Allergies  Review of patient's allergies indicates no known allergies.  Home Medications   Current Outpatient Rx  Name  Route  Sig  Dispense  Refill  . cyclobenzaprine (FLEXERIL) 10 MG tablet   Oral   Take 10 mg by mouth 3 (three) times daily as needed for muscle spasms. For muscle spasms in back         . diclofenac sodium (VOLTAREN) 1 % GEL   Topical   Apply 1 g topically daily. To back and elbow         . dicyclomine (BENTYL) 20 MG tablet   Oral   Take 20 mg by mouth 3 (three) times daily as needed. For intestinal spasms         . diphenhydrAMINE (SOMINEX) 25 MG tablet   Oral   Take 25 mg by mouth 3 (three) times daily as needed for allergies or sleep.         . fluticasone (FLONASE) 50 MCG/ACT nasal spray   Nasal   Place 2 sprays into the nose daily.         Marland Kitchen lamoTRIgine (LAMICTAL) 150 MG tablet   Oral   Take 150 mg by mouth daily.          . Multiple Vitamin (MULITIVITAMIN WITH MINERALS) TABS   Oral   Take 1 tablet by mouth daily.         Marland Kitchen perphenazine (TRILAFON) 4 MG tablet   Oral   Take 4 mg by mouth 3 (three) times  daily.          . pramipexole (MIRAPEX) 0.125 MG tablet   Oral   Take 0.125 mg by mouth at bedtime.         . promethazine (PHENERGAN) 25 MG tablet   Oral   Take 25 mg by mouth every 6 (six) hours as needed. For nausea/vomiting         . pseudoephedrine (SUDAFED) 30 MG tablet   Oral   Take 30 mg by mouth every 4 (four) hours as needed. For allergies, (pollen, cat fur, cigarette smoke, dust)         . venlafaxine XR (EFFEXOR-XR) 75 MG 24 hr capsule   Oral   Take 75 mg by mouth daily.          BP 165/97  Pulse 88  Temp(Src) 98.1 F (36.7 C) (Oral)  Resp 20  SpO2 99% Physical Exam  Nursing note and vitals reviewed. Constitutional: She is oriented to person, place, and time. She appears well-developed and well-nourished. No distress.  HENT:  Head: Normocephalic and atraumatic.  Mouth/Throat:  Oropharynx is clear and moist. No oropharyngeal exudate.  Eyes: Conjunctivae and EOM are normal. Pupils are equal, round, and reactive to light. Right eye exhibits no discharge. Left eye exhibits no discharge.  Neck: Normal range of motion. Neck supple.  Cardiovascular: Normal rate, regular rhythm and normal heart sounds.   Pulses:      Radial pulses are 2+ on the right side, and 2+ on the left side.  Pulmonary/Chest: Effort normal and breath sounds normal. No respiratory distress. She has no wheezes. She has no rales.  Abdominal: Soft. Normal appearance and bowel sounds are normal. There is tenderness in the epigastric area. There is no guarding.    Discomfort upon palpation to epigastric region Negative McBurney's point Negative Murphy's sign  Neurological: She is alert and oriented to person, place, and time. She exhibits normal muscle tone. Coordination normal.  Skin: Skin is warm and dry. No rash noted. She is not diaphoretic. No erythema.  Psychiatric: She has a normal mood and affect. Her behavior is normal. Thought content normal.    ED Course  Procedures (including critical care time)  1:24 PM patient seen and assessed by attending physician, Dr. Annitta Jersey. Suspicion to be IBS episode. Plan is for pain control, nausea control, by mouth challenge-patient able to tolerate patient to be discharged. Did not recommend imaging at this time.   2:27 PM Patient reported that she is still having nausea. Reported that she is feeling a burning sensation in the center of her abdomen, epigastric region - described as a burning sensation.   4:26 PM This provider assessed patient. Patient reported that the pain is relieved stated that she continues to feel nauseous.   5:06 PM This provider spoke with Dr. Derrek Gu, [facility] - discussed history, case, presentation, ED course, labs. Patient to be admitted to the hospital for intractable nausea and vomiting - patient was unable to  tolerate PO. Patient to be admitted to [facility] for observation.   5:11 PM Dr. Derrek Gu in room assessing patient.   Results for orders placed during the hospital encounter of 11/16/13  CBC WITH DIFFERENTIAL      Result Value Range   WBC 11.8 (*) 4.0 - 10.5 K/uL   RBC 4.92  3.87 - 5.11 MIL/uL   Hemoglobin 16.5 (*) 12.0 - 15.0 g/dL   HCT 45.4  36.0 - 46.0 %   MCV 92.3  78.0 - 100.0 fL   MCH 33.5  26.0 - 34.0 pg   MCHC 36.3 (*) 30.0 - 36.0 g/dL   RDW 13.3  11.5 - 15.5 %   Platelets 372  150 - 400 K/uL   Neutrophils Relative % 84 (*) 43 - 77 %   Neutro Abs 9.9 (*) 1.7 - 7.7 K/uL   Lymphocytes Relative 14  12 - 46 %   Lymphs Abs 1.6  0.7 - 4.0 K/uL   Monocytes Relative 2 (*) 3 - 12 %   Monocytes Absolute 0.2  0.1 - 1.0 K/uL   Eosinophils Relative 0  0 - 5 %   Eosinophils Absolute 0.0  0.0 - 0.7 K/uL   Basophils Relative 0  0 - 1 %   Basophils Absolute 0.0  0.0 - 0.1 K/uL  COMPREHENSIVE METABOLIC PANEL      Result Value Range   Sodium 135 (*) 137 - 147 mEq/L   Potassium 5.8 (*) 3.7 - 5.3 mEq/L   Chloride 97  96 - 112 mEq/L   CO2 18 (*) 19 - 32 mEq/L   Glucose, Bld 126 (*) 70 - 99 mg/dL   BUN 10  6 - 23 mg/dL   Creatinine, Ser 0.56  0.50 - 1.10 mg/dL   Calcium 9.4  8.4 - 10.5 mg/dL   Total Protein 8.3  6.0 - 8.3 g/dL   Albumin 4.1  3.5 - 5.2 g/dL   AST 50 (*) 0 - 37 U/L   ALT 21  0 - 35 U/L   Alkaline Phosphatase 151 (*) 39 - 117 U/L   Total Bilirubin 0.3  0.3 - 1.2 mg/dL   GFR calc non Af Amer >90  >90 mL/min   GFR calc Af Amer >90  >90 mL/min  TROPONIN I      Result Value Range   Troponin I <0.30  <0.30 ng/mL  LIPASE, BLOOD      Result Value Range   Lipase 12  11 - 59 U/L  POCT PREGNANCY, URINE      Result Value Range   Preg Test, Ur NEGATIVE  NEGATIVE    Labs Review Labs Reviewed  CBC WITH DIFFERENTIAL - Abnormal; Notable for the following:    WBC 11.8 (*)    Hemoglobin 16.5 (*)    MCHC 36.3 (*)    Neutrophils Relative % 84 (*)    Neutro Abs 9.9 (*)     Monocytes Relative 2 (*)    All other components within normal limits  COMPREHENSIVE METABOLIC PANEL - Abnormal; Notable for the following:    Sodium 135 (*)    Potassium 5.8 (*)    CO2 18 (*)    Glucose, Bld 126 (*)    AST 50 (*)    Alkaline Phosphatase 151 (*)    All other components within normal limits  TROPONIN I  LIPASE, BLOOD  URINALYSIS, ROUTINE W REFLEX MICROSCOPIC  PREGNANCY, URINE  URINALYSIS, ROUTINE W REFLEX MICROSCOPIC  URINE RAPID DRUG SCREEN (HOSP PERFORMED)  POCT PREGNANCY, URINE   Imaging Review No results found.  EKG Interpretation    Date/Time:  Saturday November 16 2013 12:02:52 EST Ventricular Rate:  75 PR Interval:  173 QRS Duration: 97 QT Interval:  404 QTC Calculation: 451 R Axis:   44 Text Interpretation:  Sinus rhythm Left atrial enlargement No significant change since last tracing Confirmed by Winfred Leeds  MD, SAM ([id number]) on 11/16/2013 12:08:21 PM  MDM   1. Intractable nausea and vomiting   2. IBS (irritable bowel syndrome)   3. History of cocaine abuse    Medications  enoxaparin (LOVENOX) injection 40 mg (not administered)  sodium chloride 0.9 % injection 3 mL (not administered)  acetaminophen (TYLENOL) tablet 650 mg (not administered)    Or  acetaminophen (TYLENOL) suppository 650 mg (not administered)  ondansetron (ZOFRAN) tablet 4 mg (not administered)    Or  ondansetron (ZOFRAN) injection 4 mg (not administered)  cyclobenzaprine (FLEXERIL) tablet 10 mg (not administered)  dicyclomine (BENTYL) tablet 20 mg (not administered)  fluticasone (FLONASE) 50 MCG/ACT nasal spray 2 spray (not administered)  lamoTRIgine (LAMICTAL) tablet 150 mg (not administered)  pramipexole (MIRAPEX) tablet 0.125 mg (not administered)  venlafaxine XR (EFFEXOR-XR) 24 hr capsule 75 mg (not administered)  ondansetron (ZOFRAN) injection 4 mg (4 mg Intravenous Given 11/16/13 1125)  ondansetron (ZOFRAN) injection 4 mg (4 mg Intravenous Given 11/16/13  1157)  HYDROmorphone (DILAUDID) injection 0.5 mg (0.5 mg Intravenous Given 11/16/13 1157)  sodium chloride 0.9 % bolus 1,000 mL (0 mLs Intravenous Stopped 11/16/13 1611)  promethazine (PHENERGAN) injection 25 mg (25 mg Intravenous Given 11/16/13 1439)  gi cocktail (Maalox,Lidocaine,Donnatal) (30 mLs Oral Given 11/16/13 1511)  morphine 4 MG/ML injection 4 mg (4 mg Intravenous Given 11/16/13 1607)  metoCLOPramide (REGLAN) injection 10 mg (10 mg Intravenous Given 11/16/13 1629)   Filed Vitals:   11/16/13 1415 11/16/13 1515 11/16/13 1600 11/16/13 1711  BP: 174/89 149/77 165/122 165/97  Pulse: 76 77 81 88  Temp:      TempSrc:      Resp: 19 25 17 20   SpO2: 93% 99% 94% 99%    Patient presenting to emergency part with epigastric pain, nausea, vomiting that started this morning at approximately 6:00 AM. Denied bile and blood in the emesis. Patient unable to recall exactly how many episodes of emesis she has had since it has been numerous amounts. She has history of irritable bowel syndrome-does not follow a gastroenterologist. Alert and oriented. GCS 15. Heart rate and rhythm normal. Lungs good auscultation. Radial pulses 2+ bilaterally. Bowel sounds normal active in all 4 quadrants. Discomfort upon palpation to the epigastric region. Negative Murphy's sign. Negative McBurney's point. EKG noted normal sinus rhythm with heart rate of 75 beats per minute-left atrial enlargement identified - negative ischemic findings noted.. CBC noted mild elevation white blood cell count of 11.8 with neutrophil elevation of 9.9. CMP noted hyperkalemia 5.8 - negative findings noted on EKG to associate with hyperkalemia. AST mild elevation of 50, alkaline phosphatase mild elevation of 151 - minimal change noted for approximately one month ago when labs performed on 10/11/2013. Troponin negative elevation noted. UA noted moderate Hgb with negative nitrites, leukocytes - negative pyuria noted. Urine drug screen pending.  Doubt  cardiac nature. Doubt ACS. Doubt pancreatitis. Doubt acute abdominal pain - acute abdominal processes. Suspicion to be IBS exacerbation. Pain medication controlled pain, but nausea could not be controlled within the ED. Patient failed PO challenge. Patient to be admitted to the hospital for intractable nausea and vomiting. Discussed plan for admission with patient - patient understood and agreed to plan of admission. Patient to be admitted for observation to [facility] under care of [facility]. Patient stable for transfer.   Jamse Mead, PA-C 11/16/13 1753

## 2013-11-16 NOTE — H&P (Signed)
[facility] History and Physical  [name] DTO:[id number] DOB: Mar 18, 1956 DOA: 11/16/2013  Referring physician:   PCP: Philis Fendt, MD   Chief Complaint:  Cyclical vomiting   HPI:  58 year old female with a history of multiple admissions for intractable vomiting, previous extensive workup including small bowel follow-through, colonoscopy in October 2006, endoscopy in 3/ 2007, with active marijuana use, also previous history of cocaine abuse, presents to the ER with 8-10 episodes of vomiting that started yesterday, nonbloody, nonbilious. She reports that her symptoms are usually controlled with Phenergan at home, however symptoms worsen yesterday.  She has not seen a gastroenterologist since 2006. She states that she has a hiatal hernia and complains of vague epigastric discomfort. She states that she has irritable bowel syndrome, constipation predominant. Last BM was 2 days ago. Occasionally she has diarrhea. She was last seen in the ER on 12/12 Abdominal surgical hx significant for abdominal total hysterectomy.      Review of Systems: negative for the following  Constitutional: Denies fever, chills, diaphoresis, appetite change and fatigue.  HEENT: Denies photophobia, eye pain, redness, hearing loss, ear pain, congestion, sore throat, rhinorrhea, sneezing, mouth sores, trouble swallowing, neck pain, neck stiffness and tinnitus.  Respiratory: Denies SOB, DOE, cough, chest tightness, and wheezing.  Cardiovascular: Denies chest pain, palpitations and leg swelling.  Gastrointestinal:Positive for nausea, vomiting and abdominal pain Genitourinary: Denies dysuria, urgency, frequency, hematuria, flank pain and difficulty urinating.  Musculoskeletal: Denies myalgias, back pain, joint swelling, arthralgias and gait problem.  Skin: Denies pallor, rash and wound.  Neurological: Denies dizziness, seizures, syncope, weakness, light-headedness, numbness and headaches.   Hematological: Denies adenopathy. Easy bruising, personal or family bleeding history  Psychiatric/Behavioral: Denies suicidal ideation, mood changes, confusion, nervousness, sleep disturbance and agitation       Past Medical History  Diagnosis Date  . Hiatal hernia   . IBS (irritable bowel syndrome)   . Diverticulitis   . Bipolar affective disorder   . GERD (gastroesophageal reflux disease)      Past Surgical History  Procedure Laterality Date  . Abdominal hysterectomy    . Breast lumpectomy        Social History:  reports that she has never smoked. She does not have any smokeless tobacco history on file. She reports that she uses illicit drugs (Marijuana). She reports that she does not drink alcohol.    No Known Allergies  No family history on file.   Prior to Admission medications   Medication Sig Start Date End Date Taking? Authorizing Provider  cyclobenzaprine (FLEXERIL) 10 MG tablet Take 10 mg by mouth 3 (three) times daily as needed for muscle spasms. For muscle spasms in back   Yes Historical Provider, MD  diclofenac sodium (VOLTAREN) 1 % GEL Apply 1 g topically daily. To back and elbow   Yes Historical Provider, MD  dicyclomine (BENTYL) 20 MG tablet Take 20 mg by mouth 3 (three) times daily as needed. For intestinal spasms   Yes Historical Provider, MD  diphenhydrAMINE (SOMINEX) 25 MG tablet Take 25 mg by mouth 3 (three) times daily as needed for allergies or sleep.   Yes Historical Provider, MD  fluticasone (FLONASE) 50 MCG/ACT nasal spray Place 2 sprays into the nose daily.   Yes Historical Provider, MD  lamoTRIgine (LAMICTAL) 150 MG tablet Take 150 mg by mouth daily.    Yes Historical Provider, MD  Multiple Vitamin (MULITIVITAMIN WITH MINERALS) TABS Take 1 tablet by mouth daily.   Yes Historical Provider, MD  perphenazine (  TRILAFON) 4 MG tablet Take 4 mg by mouth 3 (three) times daily.    Yes Historical Provider, MD  pramipexole (MIRAPEX) 0.125 MG tablet Take  0.125 mg by mouth at bedtime.   Yes Historical Provider, MD  promethazine (PHENERGAN) 25 MG tablet Take 25 mg by mouth every 6 (six) hours as needed. For nausea/vomiting   Yes Historical Provider, MD  pseudoephedrine (SUDAFED) 30 MG tablet Take 30 mg by mouth every 4 (four) hours as needed. For allergies, (pollen, cat fur, cigarette smoke, dust)   Yes Historical Provider, MD  venlafaxine XR (EFFEXOR-XR) 75 MG 24 hr capsule Take 75 mg by mouth daily.   Yes Historical Provider, MD     Physical Exam: Filed Vitals:   11/16/13 1415 11/16/13 1515 11/16/13 1600 11/16/13 1711  BP: 174/89 149/77 165/122 165/97  Pulse: 76 77 81 88  Temp:      TempSrc:      Resp: 19 25 17 20   SpO2: 93% 99% 94% 99%     Constitutional: Vital signs reviewed. Patient is a well-developed and well-nourished in no acute distress and cooperative with exam. Alert and oriented x3.  Head: Normocephalic and atraumatic  Ear: TM normal bilaterally  Mouth: no erythema or exudates, MMM  Eyes: PERRL, EOMI, conjunctivae normal, No scleral icterus.  Neck: Supple, Trachea midline normal ROM, No JVD, mass, thyromegaly, or carotid bruit present.  Cardiovascular: RRR, S1 normal, S2 normal, no MRG, pulses symmetric and intact bilaterally  Pulmonary/Chest: CTAB, no wheezes, rales, or rhonchi  Abdominal: No peritoneal signs or guarding appreciated  GU: no CVA tenderness Musculoskeletal: No joint deformities, erythema, or stiffness, ROM full and no nontender Ext: no edema and no cyanosis, pulses palpable bilaterally (DP and PT)  Hematology: no cervical, inginal, or axillary adenopathy.  Neurological: A&O x3, Strenght is normal and symmetric bilaterally, cranial nerve II-XII are grossly intact, no focal motor deficit, sensory intact to light touch bilaterally.  Skin: Warm, dry and intact. No rash, cyanosis, or clubbing.  Psychiatric: Normal mood and affect. speech and behavior is normal. Judgment and thought content normal. Cognition and  memory are normal.       Labs on Admission:    Basic Metabolic Panel:  Recent Labs Lab 11/16/13 1115  NA 135*  K 5.8*  CL 97  CO2 18*  GLUCOSE 126*  BUN 10  CREATININE 0.56  CALCIUM 9.4   Liver Function Tests:  Recent Labs Lab 11/16/13 1115  AST 50*  ALT 21  ALKPHOS 151*  BILITOT 0.3  PROT 8.3  ALBUMIN 4.1    Recent Labs Lab 11/16/13 1142  LIPASE 12   No results found for this basename: AMMONIA,  in the last 168 hours CBC:  Recent Labs Lab 11/16/13 1115  WBC 11.8*  NEUTROABS 9.9*  HGB 16.5*  HCT 45.4  MCV 92.3  PLT 372   Cardiac Enzymes:  Recent Labs Lab 11/16/13 1453  TROPONINI <0.30    BNP (last 3 results) No results found for this basename: PROBNP,  in the last 8760 hours    CBG: No results found for this basename: GLUCAP,  in the last 168 hours  Radiological Exams on Admission: No results found.  EKG: Independently reviewed.   Assessment/Plan Active Problems:   Intractable nausea and vomiting   Cyclical vomiting   Cyclical vomiting Patient admits to marijuana use, She also claims that this is because of hiatal hernia, also history of sigmoid diverticulosis however she does not endorse any lower quadrant pain Last  CAT scan was 4/14, abdominal KUB shows a large hiatal hernia, no obstruction If symptoms persist may consider CT scan abdomen and pelvis Patient is on Bentyl, Phenergan at home No GI evaluation since 2006 She would definitely need outpatient GI referral because of recurrent ED visits She has not had a gastric imaging study therefore we'll order this for tomorrow Also previous history suggestive of diabetes To check an A1 C.    Irritable bowel syndrome Continue Bentyl  GERD Start the patient on a PPI   Hyperkalemia Will check lactic acid although doubt mesenteric ischemia  Hemodynamically stable for telemetry  Code Status:   full Family Communication: bedside Disposition Plan: admit   Time  spent: 70 mins   [facility] Pager [phone]  If 7PM-7AM, please contact night-coverage [email or web address] Password [email or web address] 11/16/2013, 5:20 PM

## 2013-11-17 ENCOUNTER — Inpatient Hospital Stay (HOSPITAL_COMMUNITY): Payer: Medicare Other

## 2013-11-17 DIAGNOSIS — E876 Hypokalemia: Secondary | ICD-10-CM

## 2013-11-17 DIAGNOSIS — K589 Irritable bowel syndrome without diarrhea: Secondary | ICD-10-CM

## 2013-11-17 DIAGNOSIS — F319 Bipolar disorder, unspecified: Secondary | ICD-10-CM

## 2013-11-17 LAB — URINALYSIS, ROUTINE W REFLEX MICROSCOPIC
Bilirubin Urine: NEGATIVE
Glucose, UA: NEGATIVE mg/dL
KETONES UR: 40 mg/dL — AB
LEUKOCYTES UA: NEGATIVE
Nitrite: NEGATIVE
PH: 7 (ref 5.0–8.0)
Protein, ur: NEGATIVE mg/dL
Specific Gravity, Urine: 1.014 (ref 1.005–1.030)
Urobilinogen, UA: 0.2 mg/dL (ref 0.0–1.0)

## 2013-11-17 LAB — RAPID URINE DRUG SCREEN, HOSP PERFORMED
Amphetamines: NOT DETECTED
BENZODIAZEPINES: NOT DETECTED
Barbiturates: NOT DETECTED
Cocaine: NOT DETECTED
OPIATES: POSITIVE — AB
Tetrahydrocannabinol: POSITIVE — AB

## 2013-11-17 LAB — CBC
HCT: 43.2 % (ref 36.0–46.0)
Hemoglobin: 15.6 g/dL — ABNORMAL HIGH (ref 12.0–15.0)
MCH: 32.7 pg (ref 26.0–34.0)
MCHC: 36.1 g/dL — AB (ref 30.0–36.0)
MCV: 90.6 fL (ref 78.0–100.0)
PLATELETS: 366 10*3/uL (ref 150–400)
RBC: 4.77 MIL/uL (ref 3.87–5.11)
RDW: 13.1 % (ref 11.5–15.5)
WBC: 18.4 10*3/uL — ABNORMAL HIGH (ref 4.0–10.5)

## 2013-11-17 LAB — COMPREHENSIVE METABOLIC PANEL
ALT: 16 U/L (ref 0–35)
AST: 20 U/L (ref 0–37)
Albumin: 3.9 g/dL (ref 3.5–5.2)
Alkaline Phosphatase: 139 U/L — ABNORMAL HIGH (ref 39–117)
BILIRUBIN TOTAL: 0.4 mg/dL (ref 0.3–1.2)
BUN: 8 mg/dL (ref 6–23)
CO2: 16 meq/L — AB (ref 19–32)
CREATININE: 0.58 mg/dL (ref 0.50–1.10)
Calcium: 9.4 mg/dL (ref 8.4–10.5)
Chloride: 98 mEq/L (ref 96–112)
GFR calc Af Amer: >90 mL/min (ref >90)
Glucose, Bld: 111 mg/dL — ABNORMAL HIGH (ref 70–99)
POTASSIUM: 3.3 meq/L — AB (ref 3.7–5.3)
Sodium: 138 mEq/L (ref 137–147)
Total Protein: 7.6 g/dL (ref 6.0–8.3)

## 2013-11-17 LAB — TSH: TSH: 1.685 u[IU]/mL (ref 0.350–4.500)

## 2013-11-17 LAB — URINE MICROSCOPIC-ADD ON

## 2013-11-17 LAB — GLUCOSE, CAPILLARY: GLUCOSE-CAPILLARY: 135 mg/dL — AB (ref 70–99)

## 2013-11-17 LAB — HEMOGLOBIN A1C
Hgb A1c MFr Bld: 5.6 % (ref <5.7)
Mean Plasma Glucose: 114 mg/dL (ref <117)

## 2013-11-17 LAB — PREGNANCY, URINE: PREG TEST UR: NEGATIVE

## 2013-11-17 MED ORDER — PANTOPRAZOLE SODIUM 40 MG IV SOLR
40.0000 mg | Freq: Two times a day (BID) | INTRAVENOUS | Status: DC
  Administered 2013-11-17 (×2): 40 mg via INTRAVENOUS
  Filled 2013-11-17 (×4): qty 40

## 2013-11-17 MED ORDER — HYDROMORPHONE HCL PF 1 MG/ML IJ SOLN
1.0000 mg | INTRAMUSCULAR | Status: DC | PRN
  Administered 2013-11-17 (×3): 1 mg via INTRAVENOUS
  Filled 2013-11-17 (×4): qty 1

## 2013-11-17 MED ORDER — ONDANSETRON HCL 4 MG/2ML IJ SOLN: 4.0000 mg | Freq: Four times a day (QID) | INTRAMUSCULAR | Status: DC

## 2013-11-17 MED ORDER — SODIUM CHLORIDE 0.9 % IV SOLN
INTRAVENOUS | Status: DC
  Administered 2013-11-17 – 2013-11-18 (×3): via INTRAVENOUS

## 2013-11-17 MED ORDER — LORAZEPAM 2 MG/ML IJ SOLN
0.5000 mg | Freq: Four times a day (QID) | INTRAMUSCULAR | Status: DC | PRN
  Administered 2013-11-17: 0.5 mg via INTRAVENOUS
  Filled 2013-11-17: qty 1

## 2013-11-17 MED ORDER — POTASSIUM CHLORIDE 10 MEQ/100ML IV SOLN
10.0000 meq | INTRAVENOUS | Status: AC
  Administered 2013-11-17 (×2): 10 meq via INTRAVENOUS
  Filled 2013-11-17 (×2): qty 100

## 2013-11-17 MED ORDER — PROMETHAZINE HCL 25 MG/ML IJ SOLN
25.0000 mg | INTRAMUSCULAR | Status: DC | PRN
  Administered 2013-11-17 – 2013-11-18 (×5): 25 mg via INTRAVENOUS
  Filled 2013-11-17 (×5): qty 1

## 2013-11-17 NOTE — Progress Notes (Signed)
Utilization review completed.  

## 2013-11-17 NOTE — Progress Notes (Signed)
PATIENT DETAILS Name: [name] Age: 58 y.o. Sex: female Date of Birth: 03/25/1956 Admit Date: 11/16/2013 Admitting Physician Reyne Dumas, MD TKZ:[id number] A, MD  Subjective: Continues to have numerous episodes of vomiting overnight.  Assessment/Plan: ? Cyclical vomiting syndrome - Supportive care with IV antiemetics as needed, add IV Ativan as needed - Start PPI - We'll check a x-ray of the abdomen- however very  Soft  Leukocytosis - Suspect it to be reactive, no fever. - UA negative for UTI, check chest x-ray. - Since nontoxic looking, monitor off antibiotics  Dehydration - Secondary to vomiting - Continue IV fluids  Hypokalemia - Secondary to vomiting - Will replace and recheck in a.m.  History of irritable bowel syndrome - Continue Benadryl  History of bipolar disorder - Continue with current preadmission medications  Disposition: Remain inpatient  DVT Prophylaxis: Prophylactic Lovenox   Code Status: Full code   Family Communication None at bedside  Procedures:  None  CONSULTS:  None   MEDICATIONS: Scheduled Meds: . dicyclomine  20 mg Oral TID AC & HS  . enoxaparin (LOVENOX) injection  40 mg Subcutaneous Q24H  . fluticasone  2 spray Each Nare Daily  . influenza vac split quadrivalent PF  0.5 mL Intramuscular Tomorrow-1000  . lamoTRIgine  150 mg Oral Daily  . pantoprazole (PROTONIX) IV  40 mg Intravenous Q12H  . pramipexole  0.125 mg Oral QHS  . sodium chloride  3 mL Intravenous Q12H  . venlafaxine XR  75 mg Oral Daily   Continuous Infusions: . sodium chloride 100 mL/hr at 11/17/13 0808   PRN Meds:.acetaminophen, acetaminophen, cyclobenzaprine, HYDROmorphone (DILAUDID) injection, LORazepam, ondansetron (ZOFRAN) IV, ondansetron, promethazine  Antibiotics: Anti-infectives   None       PHYSICAL EXAM: Vital signs in last 24 hours: Filed Vitals:   11/16/13 1739 11/16/13 1828 11/16/13 2101 11/17/13 0700  BP: 125/68  156/90 163/92 160/88  Pulse: 97 91 83 100  Temp:  98.7 F (37.1 C) 98.5 F (36.9 C) 98.8 F (37.1 C)  TempSrc:  Oral Oral Oral  Resp: 18 20 18 18   Height:  5\' 6"  (1.676 m)    Weight:  81.647 kg (180 lb)    SpO2: 99% 100% 97% 96%    Weight change:  Filed Weights   11/16/13 1828  Weight: 81.647 kg (180 lb)   Body mass index is 29.07 kg/(m^2).   Gen Exam: Awake and alert with clear speech.   Neck: Supple, No JVD.  Chest: B/L Clear.   CVS: S1 S2 Regular, no murmurs. Abdomen: soft, BS +, mildly tender in the epigastric area, non distended. Extremities: no edema, lower extremities warm to touch. Neurologic: Non Focal.   Skin: No Rash.   Wounds: N/A.    Intake/Output from previous day:  Intake/Output Summary (Last 24 hours) at 11/17/13 1021 Last data filed at 11/17/13 0933  Gross per 24 hour  Intake   1000 ml  Output   1800 ml  Net   -800 ml     LAB RESULTS: CBC  Recent Labs Lab 11/16/13 1115 11/16/13 1732 11/17/13 0610  WBC 11.8* 10.1 18.4*  HGB 16.5* 16.1* 15.6*  HCT 45.4 44.5 43.2  PLT 372 350 366  MCV 92.3 91.8 90.6  MCH 33.5 33.2 32.7  MCHC 36.3* 36.2* 36.1*  RDW 13.3 13.1 13.1  LYMPHSABS 1.6  --   --   MONOABS 0.2  --   --   EOSABS 0.0  --   --   BASOSABS 0.0  --   --  Chemistries   Recent Labs Lab 11/16/13 1115 11/16/13 1732 11/17/13 0610  NA 135*  --  138  K 5.8*  --  3.3*  CL 97  --  98  CO2 18*  --  16*  GLUCOSE 126*  --  111*  BUN 10  --  8  CREATININE 0.56  --  0.58  CALCIUM 9.4  --  9.4  MG  --  1.7  --     CBG:  Recent Labs Lab 11/17/13 0609  GLUCAP 135*    GFR Estimated Creatinine Clearance: 84.5 ml/min (by C-G formula based on Cr of 0.58).  Coagulation profile No results found for this basename: INR, PROTIME,  in the last 168 hours  Cardiac Enzymes  Recent Labs Lab 11/16/13 1453 11/16/13 1732  TROPONINI <0.30 <0.30    No components found with this basename: POCBNP,  No results found for this basename:  DDIMER,  in the last 72 hours  Recent Labs  11/16/13 1732  HGBA1C 5.6   No results found for this basename: CHOL, HDL, LDLCALC, TRIG, CHOLHDL, LDLDIRECT,  in the last 72 hours  Recent Labs  11/16/13 1732  TSH 1.685   No results found for this basename: VITAMINB12, FOLATE, FERRITIN, TIBC, IRON, RETICCTPCT,  in the last 72 hours  Recent Labs  11/16/13 1142  LIPASE 12    Urine Studies No results found for this basename: UACOL, UAPR, USPG, UPH, UTP, UGL, UKET, UBIL, UHGB, UNIT, UROB, ULEU, UEPI, UWBC, URBC, UBAC, CAST, CRYS, UCOM, BILUA,  in the last 72 hours  MICROBIOLOGY: No results found for this or any previous visit (from the past 240 hour(s)).  RADIOLOGY STUDIES/RESULTS: No results found.  Oren Binet, MD  [facility] Pager:[phone]  If 7PM-7AM, please contact night-coverage [email or web address] Password [email or web address] 11/17/2013, 10:21 AM   LOS: 1 day

## 2013-11-17 NOTE — ED Provider Notes (Signed)
Medical screening examination/treatment/procedure(s) were conducted as a shared visit with non-physician practitioner(s) and myself.  I personally evaluated the patient during the encounter.  EKG Interpretation    Date/Time:  Saturday November 16 2013 12:02:52 EST Ventricular Rate:  75 PR Interval:  173 QRS Duration: 97 QT Interval:  404 QTC Calculation: 451 R Axis:   44 Text Interpretation:  Sinus rhythm Left atrial enlargement No significant change since last tracing Confirmed by Winfred Leeds  MD, Cay Kath ([id number]) on 11/16/2013 12:08:21 PM             Orlie Dakin, MD 11/17/13 [phone]

## 2013-11-18 ENCOUNTER — Inpatient Hospital Stay (HOSPITAL_COMMUNITY): Payer: Medicare Other

## 2013-11-18 LAB — BASIC METABOLIC PANEL
BUN: 13 mg/dL (ref 6–23)
CO2: 17 mEq/L — ABNORMAL LOW (ref 19–32)
CREATININE: 0.6 mg/dL (ref 0.50–1.10)
Calcium: 8.6 mg/dL (ref 8.4–10.5)
Chloride: 104 mEq/L (ref 96–112)
GFR calc non Af Amer: >90 mL/min (ref >90)
Glucose, Bld: 89 mg/dL (ref 70–99)
Potassium: 3.2 mEq/L — ABNORMAL LOW (ref 3.7–5.3)
Sodium: 139 mEq/L (ref 137–147)

## 2013-11-18 LAB — CBC
HCT: 40.4 % (ref 36.0–46.0)
Hemoglobin: 14.3 g/dL (ref 12.0–15.0)
MCH: 32.6 pg (ref 26.0–34.0)
MCHC: 35.4 g/dL (ref 30.0–36.0)
MCV: 92 fL (ref 78.0–100.0)
PLATELETS: 292 10*3/uL (ref 150–400)
RBC: 4.39 MIL/uL (ref 3.87–5.11)
RDW: 13.5 % (ref 11.5–15.5)
WBC: 13.3 10*3/uL — ABNORMAL HIGH (ref 4.0–10.5)

## 2013-11-18 MED ORDER — POTASSIUM CHLORIDE CRYS ER 20 MEQ PO TBCR
40.0000 meq | EXTENDED_RELEASE_TABLET | Freq: Once | ORAL | Status: AC
  Administered 2013-11-18: 40 meq via ORAL
  Filled 2013-11-18: qty 2

## 2013-11-18 MED ORDER — BISACODYL 10 MG RE SUPP: 10.0000 mg | RECTAL | Status: DC | PRN

## 2013-11-18 MED ORDER — POTASSIUM CHLORIDE 10 MEQ/100ML IV SOLN
10.0000 meq | INTRAVENOUS | Status: AC
  Filled 2013-11-18 (×2): qty 100

## 2013-11-18 NOTE — Progress Notes (Signed)
PATIENT DETAILS Name: [name] Age: 58 y.o. Sex: female Date of Birth: 1956/02/16 Admit Date: 11/16/2013 Admitting Physician Reyne Dumas, MD QMG:[id number] A, MD  Subjective: Significantly improved, no vomiting. NPO for gastric emptying study  Assessment/Plan: ? Cyclical vomiting syndrome - Significantly improved Supportive care with IV antiemetics as needed,  IV Ativan as needed.On PPI -  x-ray of the abdomen-no obstruction, belly very  Soft - Advance diet, and if tolerates discharge home later today  Leukocytosis - Suspect it to be reactive, no fever. WBC downtrending. - UA negative for UTI, check chest x-ray. - Since nontoxic looking, monitored off antibiotics  Dehydration - Secondary to vomiting - Resolved with IV fluids.  Hypokalemia - Secondary to vomiting - Will replace today.   History of irritable bowel syndrome - Continue Benadryl  History of bipolar disorder - Continue with current preadmission medications  Disposition: Remain inpatient- home later today, or tomorrow  DVT Prophylaxis: Prophylactic Lovenox   Code Status: Full code   Family Communication None at bedside  Procedures:  None  CONSULTS:  None   MEDICATIONS: Scheduled Meds: . dicyclomine  20 mg Oral TID AC & HS  . enoxaparin (LOVENOX) injection  40 mg Subcutaneous Q24H  . fluticasone  2 spray Each Nare Daily  . lamoTRIgine  150 mg Oral Daily  . pantoprazole (PROTONIX) IV  40 mg Intravenous Q12H  . pramipexole  0.125 mg Oral QHS  . sodium chloride  3 mL Intravenous Q12H  . venlafaxine XR  75 mg Oral Daily   Continuous Infusions: . sodium chloride 100 mL/hr at 11/18/13 0840   PRN Meds:.acetaminophen, acetaminophen, cyclobenzaprine, HYDROmorphone (DILAUDID) injection, LORazepam, promethazine  Antibiotics: Anti-infectives   None       PHYSICAL EXAM: Vital signs in last 24 hours: Filed Vitals:   11/17/13 0700 11/17/13 1349 11/17/13 2115 11/18/13 0455   BP: 160/88 145/87 132/83 144/88  Pulse: 100 95 85 74  Temp: 98.8 F (37.1 C) 97 F (36.1 C) 99.1 F (37.3 C) 98.7 F (37.1 C)  TempSrc: Oral Oral Oral Oral  Resp: 18 18 18 18   Height:      Weight:      SpO2: 96% 96% 97% 98%    Weight change:  Filed Weights   11/16/13 1828  Weight: 81.647 kg (180 lb)   Body mass index is 29.07 kg/(m^2).   Gen Exam: Awake and alert with clear speech.   Neck: Supple, No JVD.  Chest: B/L Clear.   CVS: S1 S2 Regular, no murmurs. Abdomen: soft, BS +, mildly tender in the epigastric area, non distended. Extremities: no edema, lower extremities warm to touch. Neurologic: Non Focal.   Skin: No Rash.   Wounds: N/A.    Intake/Output from previous day:  Intake/Output Summary (Last 24 hours) at 11/18/13 1210 Last data filed at 11/18/13 0532  Gross per 24 hour  Intake   2140 ml  Output    800 ml  Net   1340 ml     LAB RESULTS: CBC  Recent Labs Lab 11/16/13 1115 11/16/13 1732 11/17/13 0610 11/18/13 0520  WBC 11.8* 10.1 18.4* 13.3*  HGB 16.5* 16.1* 15.6* 14.3  HCT 45.4 44.5 43.2 40.4  PLT 372 350 366 292  MCV 92.3 91.8 90.6 92.0  MCH 33.5 33.2 32.7 32.6  MCHC 36.3* 36.2* 36.1* 35.4  RDW 13.3 13.1 13.1 13.5  LYMPHSABS 1.6  --   --   --   MONOABS 0.2  --   --   --  EOSABS 0.0  --   --   --   BASOSABS 0.0  --   --   --     Chemistries   Recent Labs Lab 11/16/13 1115 11/16/13 1732 11/17/13 0610 11/18/13 0520  NA 135*  --  138 139  K 5.8*  --  3.3* 3.2*  CL 97  --  98 104  CO2 18*  --  16* 17*  GLUCOSE 126*  --  111* 89  BUN 10  --  8 13  CREATININE 0.56  --  0.58 0.60  CALCIUM 9.4  --  9.4 8.6  MG  --  1.7  --   --     CBG:  Recent Labs Lab 11/17/13 0609  GLUCAP 135*    GFR Estimated Creatinine Clearance: 84.5 ml/min (by C-G formula based on Cr of 0.6).  Coagulation profile No results found for this basename: INR, PROTIME,  in the last 168 hours  Cardiac Enzymes  Recent Labs Lab 11/16/13 1453  11/16/13 1732  TROPONINI <0.30 <0.30    No components found with this basename: POCBNP,  No results found for this basename: DDIMER,  in the last 72 hours  Recent Labs  11/16/13 1732  HGBA1C 5.6   No results found for this basename: CHOL, HDL, LDLCALC, TRIG, CHOLHDL, LDLDIRECT,  in the last 72 hours  Recent Labs  11/16/13 1732  TSH 1.685   No results found for this basename: VITAMINB12, FOLATE, FERRITIN, TIBC, IRON, RETICCTPCT,  in the last 72 hours  Recent Labs  11/16/13 1142  LIPASE 12    Urine Studies No results found for this basename: UACOL, UAPR, USPG, UPH, UTP, UGL, UKET, UBIL, UHGB, UNIT, UROB, ULEU, UEPI, UWBC, URBC, UBAC, CAST, CRYS, UCOM, BILUA,  in the last 72 hours  MICROBIOLOGY: No results found for this or any previous visit (from the past 240 hour(s)).  RADIOLOGY STUDIES/RESULTS: No results found.  Oren Binet, MD  [facility] Pager:[phone]  If 7PM-7AM, please contact night-coverage [email or web address] Password [email or web address] 11/18/2013, 12:10 PM   LOS: 2 days

## 2013-11-18 NOTE — Progress Notes (Signed)
Patient to discharge home with family.  Patient refused wheelchair escort for discharge.  Discharge information given; patient verbalized understanding.  IV already removed prior to discharge.  No complaints of pain or other signs or symptoms noted.

## 2013-11-18 NOTE — Discharge Summary (Signed)
PATIENT DETAILS Name: [name] Age: 58 y.o. Sex: female Date of Birth: [birth year] MRN: [id number]. Admit Date: 11/16/2013 Admitting Physician: Reyne Dumas, MD NGE:[id number] A, MD  Recommendations for Outpatient Follow-up:  1. Consider gastroenterology referral 2. Please check CBC and chemistries at next visit  PRIMARY DISCHARGE DIAGNOSIS:  Active Problems:   Intractable nausea and vomiting   Cyclical vomiting   Dehydration   Hypokalemia      PAST MEDICAL HISTORY: Past Medical History  Diagnosis Date  . Hiatal hernia   . IBS (irritable bowel syndrome)   . Diverticulitis   . Bipolar affective disorder   . GERD (gastroesophageal reflux disease)     DISCHARGE MEDICATIONS:   Medication List         bisacodyl 10 MG suppository  Commonly known as:  DULCOLAX  Place 1 suppository (10 mg total) rectally as needed for moderate constipation.     cyclobenzaprine 10 MG tablet  Commonly known as:  FLEXERIL  Take 10 mg by mouth 3 (three) times daily as needed for muscle spasms. For muscle spasms in back     diclofenac sodium 1 % Gel  Commonly known as:  VOLTAREN  Apply 1 g topically daily. To back and elbow     dicyclomine 20 MG tablet  Commonly known as:  BENTYL  Take 20 mg by mouth 3 (three) times daily as needed. For intestinal spasms     diphenhydrAMINE 25 MG tablet  Commonly known as:  SOMINEX  Take 25 mg by mouth 3 (three) times daily as needed for allergies or sleep.     fluticasone 50 MCG/ACT nasal spray  Commonly known as:  FLONASE  Place 2 sprays into the nose daily.     lamoTRIgine 150 MG tablet  Commonly known as:  LAMICTAL  Take 150 mg by mouth daily.     multivitamin with minerals Tabs tablet  Take 1 tablet by mouth daily.     perphenazine 4 MG tablet  Commonly known as:  TRILAFON  Take 4 mg by mouth 3 (three) times daily.     pramipexole 0.125 MG tablet  Commonly known as:  MIRAPEX  Take 0.125 mg by mouth at bedtime.      promethazine 25 MG tablet  Commonly known as:  PHENERGAN  Take 25 mg by mouth every 6 (six) hours as needed. For nausea/vomiting     pseudoephedrine 30 MG tablet  Commonly known as:  SUDAFED  Take 30 mg by mouth every 4 (four) hours as needed. For allergies, (pollen, cat fur, cigarette smoke, dust)     venlafaxine XR 75 MG 24 hr capsule  Commonly known as:  EFFEXOR-XR  Take 75 mg by mouth daily.        ALLERGIES:  No Known Allergies  BRIEF HPI:  See H&P, Labs, Consult and Test reports for all details in brief, patient was admitted for persistent vomiting. Apparently patient has had similar issues over the past few years. Patient was admitted for supportive care.  CONSULTATIONS:   None  PERTINENT RADIOLOGIC STUDIES: Nm Gastric Emptying  11/18/2013   CLINICAL DATA:  Recurrent nausea and vomiting  EXAM: NUCLEAR MEDICINE GASTRIC EMPTYING SCAN  Views:  LAO stomach  Radionuclide: Technetium 49m sulfur colloid in egg  Dose:  2.0 mCi  Route of administration: Oral  COMPARISON:  None.  FINDINGS: The patient consumed radiotracer in solid material orally. Images and count statistics were obtained serially over a 2 hr time interval. After 60 min, 61% of  solid material had emptied from the stomach. After 90 min, 83% of solid material had emptied from the stomach. After 120 min, 94% of solid material had emptied from the stomach. These values are within normal limits.  IMPRESSION: Normal gastric emptying study for solid material.   Electronically Signed   By: Lowella Grip M.D.   On: 11/18/2013 12:47   Dg Abd Acute W/chest  11/17/2013   CLINICAL DATA:  Persistent abdominal pain with vomiting  EXAM: ACUTE ABDOMEN SERIES (ABDOMEN 2 VIEW & CHEST 1 VIEW)  COMPARISON:  Acute abdominal series dated October 11, 2013.  FINDINGS: The chest film reveals the lungs to be well-expanded. There is no focal infiltrate. There is a large hiatal hernia -partially intrathoracic stomach which appears stable. The  cardiac silhouette is normal in size. The pulmonary vascularity is not engorged. The mediastinum is normal in width. There is no pleural effusion.  Within the abdomen there is a small amount of gas within the stomach. There are loops of mildly distended gas and fluid-filled small bowel in the mid and lower abdomen predominantly to the left of midline. There is a small amount of gas and stool within the right colon. There is gas within loops of bowel in the pelvis. There are phleboliths within the pelvis. No free extraluminal gas collections are demonstrated. The observed portions of the bony thorax appear normal. There is calcification to the left of the lumbar spine likely within the wall of the abdominal aorta.  IMPRESSION: 1. The bowel gas pattern is nonspecific. A mild ileus or gastroenteritis type process may be present. There is no evidence of obstruction or perforation. 2. There is no evidence of pneumonia. There is a moderate-sized hiatal hernia-partially intrathoracic stomach.   Electronically Signed   By: David  Martinique   On: 11/17/2013 15:52     PERTINENT LAB RESULTS: CBC:  Recent Labs  11/17/13 0610 11/18/13 0520  WBC 18.4* 13.3*  HGB 15.6* 14.3  HCT 43.2 40.4  PLT 366 292   CMET CMP     Component Value Date/Time   NA 139 11/18/2013 0520   K 3.2* 11/18/2013 0520   CL 104 11/18/2013 0520   CO2 17* 11/18/2013 0520   GLUCOSE 89 11/18/2013 0520   BUN 13 11/18/2013 0520   CREATININE 0.60 11/18/2013 0520   CALCIUM 8.6 11/18/2013 0520   PROT 7.6 11/17/2013 0610   ALBUMIN 3.9 11/17/2013 0610   AST 20 11/17/2013 0610   ALT 16 11/17/2013 0610   ALKPHOS 139* 11/17/2013 0610   BILITOT 0.4 11/17/2013 0610   GFRNONAA >90 11/18/2013 0520   GFRAA >90 11/18/2013 0520    GFR Estimated Creatinine Clearance: 84.5 ml/min (by C-G formula based on Cr of 0.6).  Recent Labs  11/16/13 1142  LIPASE 12    Recent Labs  11/16/13 1453 11/16/13 1732  TROPONINI <0.30 <0.30   No components found with  this basename: POCBNP,  No results found for this basename: DDIMER,  in the last 72 hours  Recent Labs  11/16/13 1732  HGBA1C 5.6   No results found for this basename: CHOL, HDL, LDLCALC, TRIG, CHOLHDL, LDLDIRECT,  in the last 72 hours  Recent Labs  11/16/13 1732  TSH 1.685   No results found for this basename: VITAMINB12, FOLATE, FERRITIN, TIBC, IRON, RETICCTPCT,  in the last 72 hours Coags: No results found for this basename: PT, INR,  in the last 72 hours Microbiology: No results found for this or any previous visit (from the  past 240 hour(s)).   BRIEF HOSPITAL COURSE:  ? Cyclical vomiting syndrome  -admitted for supportive care - Significantly improved supportive care with IV antiemetics as needed, IV Ativan as needed.On PPI  - x-ray of the abdomen-no obstruction, belly very Soft on exam. - Gastric emptying study negative. - Patient has been started on a liquid diet which she has tolerated. She is now asking for discharge. Patient claims she is very familiar with her condition, and can advance her diet at home herself. She is being discharged, to request. I have asked her to stay on a full liquid diet for the next few days and slowly advanced to a mechanical soft diet. - She'll continue on Phenergan. I have asked her to ask her primary care practitioner for a gastroenterology referral.  Leukocytosis  - Suspect it to be reactive, no fever. WBC downtrending.  - UA negative for UTI, check chest x-ray.  - Since nontoxic looking, monitored off antibiotics   Dehydration  - Secondary to vomiting  - Resolved with IV fluids.   Hypokalemia  - Secondary to vomiting  - This was repleted, I've asked patient to get a chemistry panel at PCPs visit within a week.  History of irritable bowel syndrome  - Continue dicyclomine  History of bipolar disorder  - Continue with current preadmission medications  TODAY-DAY OF DISCHARGE:  Subjective:   Adoria Klauss today has no  headache,no chest abdominal pain,no new weakness tingling or numbness, feels much better wants to go home today. Feeling significantly better without any vomiting for the past 24 hours. Tolerating liquids.  Objective:   Blood pressure 144/82, pulse 66, temperature 98 F (36.7 C), temperature source Oral, resp. rate 20, height 5\' 6"  (1.676 m), weight 81.647 kg (180 lb), SpO2 97.00%.  Intake/Output Summary (Last 24 hours) at 11/18/13 1521 Last data filed at 11/18/13 0532  Gross per 24 hour  Intake   2140 ml  Output    800 ml  Net   1340 ml   Filed Weights   11/16/13 1828  Weight: 81.647 kg (180 lb)    Exam Awake Alert, Oriented *3, No new F.N deficits, Normal affect [place].AT,PERRAL Supple Neck,No JVD, No cervical lymphadenopathy appriciated.  Symmetrical Chest wall movement, Good air movement bilaterally, CTAB RRR,No Gallops,Rubs or new Murmurs, No Parasternal Heave +ve B.Sounds, Abd Soft, Non tender, No organomegaly appriciated, No rebound -guarding or rigidity. No Cyanosis, Clubbing or edema, No new Rash or bruise  DISCHARGE CONDITION: Stable  DISPOSITION: Home  DISCHARGE INSTRUCTIONS:    Activity:  As tolerated  Diet recommendation: Stay on a full liquid diet for the next 2 or 3 days, then slowly advanced to a mechanical soft diet.      Discharge Orders   Future Orders Complete By Expires   Call MD for:  persistant nausea and vomiting  As directed    Call MD for:  severe uncontrolled pain  As directed    Diet general  As directed    Comments:     Stay on full liquids for a few days and advance as tolerated.   Increase activity slowly  As directed       Follow-up Information   Follow up with AVBUERE,EDWIN A, MD. Schedule an appointment as soon as possible for a visit in 1 week.   Specialty:  Internal Medicine   Contact information:   [address] [address] [phone]      Total Time spent on discharge equals 45  minutes.  SignedOren Binet 11/18/2013 3:21 PM

## 2013-12-29 DIAGNOSIS — I639 Cerebral infarction, unspecified: Secondary | ICD-10-CM

## 2013-12-29 HISTORY — DX: Cerebral infarction, unspecified: I63.9

## 2014-01-26 ENCOUNTER — Encounter (HOSPITAL_COMMUNITY): Payer: Self-pay | Admitting: Emergency Medicine

## 2014-01-26 ENCOUNTER — Emergency Department (HOSPITAL_COMMUNITY)
Admission: EM | Admit: 2014-01-26 | Discharge: 2014-01-26 | Disposition: A | Discharge status: Home / Self Care | Payer: Medicare Other | Source: Home / Self Care | Attending: Emergency Medicine | Admitting: Emergency Medicine

## 2014-01-26 DIAGNOSIS — K589 Irritable bowel syndrome without diarrhea: Secondary | ICD-10-CM | POA: Insufficient documentation

## 2014-01-26 DIAGNOSIS — Z90710 Acquired absence of both cervix and uterus: Secondary | ICD-10-CM

## 2014-01-26 DIAGNOSIS — R1013 Epigastric pain: Secondary | ICD-10-CM | POA: Insufficient documentation

## 2014-01-26 DIAGNOSIS — IMO0002 Reserved for concepts with insufficient information to code with codable children: Secondary | ICD-10-CM

## 2014-01-26 DIAGNOSIS — R112 Nausea with vomiting, unspecified: Secondary | ICD-10-CM | POA: Insufficient documentation

## 2014-01-26 DIAGNOSIS — F319 Bipolar disorder, unspecified: Secondary | ICD-10-CM | POA: Insufficient documentation

## 2014-01-26 DIAGNOSIS — Z79899 Other long term (current) drug therapy: Secondary | ICD-10-CM

## 2014-01-26 DIAGNOSIS — Z791 Long term (current) use of non-steroidal anti-inflammatories (NSAID): Secondary | ICD-10-CM | POA: Insufficient documentation

## 2014-01-26 LAB — URINALYSIS, ROUTINE W REFLEX MICROSCOPIC
Bilirubin Urine: NEGATIVE
Glucose, UA: NEGATIVE mg/dL
Ketones, ur: NEGATIVE mg/dL
Nitrite: NEGATIVE
Protein, ur: NEGATIVE mg/dL
Specific Gravity, Urine: 1.021 (ref 1.005–1.030)
Urobilinogen, UA: 0.2 mg/dL (ref 0.0–1.0)
pH: 7 (ref 5.0–8.0)

## 2014-01-26 LAB — LIPASE, BLOOD: Lipase: 17 U/L (ref 11–59)

## 2014-01-26 LAB — CBC WITH DIFFERENTIAL/PLATELET
Basophils Absolute: 0.1 10*3/uL (ref 0.0–0.1)
Basophils Relative: 0 % (ref 0–1)
Eosinophils Absolute: 0 10*3/uL (ref 0.0–0.7)
Eosinophils Relative: 0 % (ref 0–5)
HCT: 43.7 % (ref 36.0–46.0)
Hemoglobin: 15.9 g/dL — ABNORMAL HIGH (ref 12.0–15.0)
Lymphocytes Relative: 16 % (ref 12–46)
Lymphs Abs: 1.9 10*3/uL (ref 0.7–4.0)
MCH: 33.7 pg (ref 26.0–34.0)
MCHC: 36.4 g/dL — ABNORMAL HIGH (ref 30.0–36.0)
MCV: 92.6 fL (ref 78.0–100.0)
Monocytes Absolute: 0.4 10*3/uL (ref 0.1–1.0)
Monocytes Relative: 3 % (ref 3–12)
Neutro Abs: 9.5 10*3/uL — ABNORMAL HIGH (ref 1.7–7.7)
Neutrophils Relative %: 81 % — ABNORMAL HIGH (ref 43–77)
Platelets: 358 10*3/uL (ref 150–400)
RBC: 4.72 MIL/uL (ref 3.87–5.11)
RDW: 14.3 % (ref 11.5–15.5)
WBC: 11.9 10*3/uL — ABNORMAL HIGH (ref 4.0–10.5)

## 2014-01-26 LAB — URINE MICROSCOPIC-ADD ON

## 2014-01-26 LAB — COMPREHENSIVE METABOLIC PANEL
ALT: 15 U/L (ref 0–35)
AST: 34 U/L (ref 0–37)
Albumin: 4.1 g/dL (ref 3.5–5.2)
Alkaline Phosphatase: 134 U/L — ABNORMAL HIGH (ref 39–117)
BUN: 14 mg/dL (ref 6–23)
CO2: 24 mEq/L (ref 19–32)
Calcium: 9.4 mg/dL (ref 8.4–10.5)
Chloride: 97 mEq/L (ref 96–112)
Creatinine, Ser: 0.51 mg/dL (ref 0.50–1.10)
GFR calc Af Amer: >90 mL/min (ref >90)
GFR calc non Af Amer: >90 mL/min (ref >90)
Glucose, Bld: 115 mg/dL — ABNORMAL HIGH (ref 70–99)
Potassium: 4.4 mEq/L (ref 3.7–5.3)
Sodium: 138 mEq/L (ref 137–147)
Total Bilirubin: 0.3 mg/dL (ref 0.3–1.2)
Total Protein: 7.7 g/dL (ref 6.0–8.3)

## 2014-01-26 MED ORDER — SODIUM CHLORIDE 0.9 % IV BOLUS (SEPSIS)
1000.0000 mL | Freq: Once | INTRAVENOUS | Status: AC
  Administered 2014-01-26: 1000 mL via INTRAVENOUS

## 2014-01-26 MED ORDER — MORPHINE SULFATE 4 MG/ML IJ SOLN
4.0000 mg | Freq: Once | INTRAMUSCULAR | Status: AC
  Administered 2014-01-26: 4 mg via INTRAVENOUS
  Filled 2014-01-26: qty 1

## 2014-01-26 MED ORDER — PROMETHAZINE HCL 25 MG RE SUPP: 25.0000 mg | Freq: Four times a day (QID) | RECTAL | Status: DC | PRN

## 2014-01-26 MED ORDER — GI COCKTAIL ~~LOC~~
30.0000 mL | Freq: Once | ORAL | Status: AC
  Administered 2014-01-26: 30 mL via ORAL
  Filled 2014-01-26: qty 30

## 2014-01-26 MED ORDER — ONDANSETRON HCL 4 MG/2ML IJ SOLN
4.0000 mg | Freq: Once | INTRAMUSCULAR | Status: AC
  Administered 2014-01-26: 4 mg via INTRAVENOUS
  Filled 2014-01-26: qty 2

## 2014-01-26 MED ORDER — METOCLOPRAMIDE HCL 5 MG/ML IJ SOLN
10.0000 mg | Freq: Once | INTRAMUSCULAR | Status: AC
  Administered 2014-01-26: 10 mg via INTRAVENOUS
  Filled 2014-01-26: qty 2

## 2014-01-26 NOTE — ED Provider Notes (Signed)
CSN: [id number]     Arrival date & time 01/26/14  0941 History   First MD Initiated Contact with Patient 01/26/14 1112     Chief Complaint  Patient presents with  . Emesis     (Consider location/radiation/quality/duration/timing/severity/associated sxs/prior Treatment) HPI [name] is a 58 y.o. female who presents to emergency department complaining of nausea, vomiting, abdominal pain. Patient states she has history of cyclical vomiting. States has had similar presents with same exact pain in the past. She reports her symptoms started with nausea vomiting around 5 AM this morning. States she developed associated epigastric pain, which she states is from vomiting. Patient denies any bowel movements today, but states she had one episode of diarrhea yesterday. She denies any blood in her stool or emesis. She tried to take some Bentyl and Phenergan but was unable to keep it down. Patient states normally when she develops this persistent vomiting, she needs to calm to emergency department. Patient denies any fever, chills. No other complaints. No back pain or urinary symptoms. Nothing making her symptoms better or worse.  Past Medical History  Diagnosis Date  . Hiatal hernia   . IBS (irritable bowel syndrome)   . Diverticulitis   . Bipolar affective disorder   . GERD (gastroesophageal reflux disease)    Past Surgical History  Procedure Laterality Date  . Abdominal hysterectomy    . Breast lumpectomy     History reviewed. No pertinent family history. History  Substance Use Topics  . Smoking status: Never Smoker   . Smokeless tobacco: Not on file  . Alcohol Use: No   OB History   Grav Para Term Preterm Abortions TAB SAB Ect Mult Living                 Review of Systems  Constitutional: Negative for fever and chills.  Respiratory: Negative for cough, chest tightness and shortness of breath.   Cardiovascular: Negative for chest pain, palpitations and leg swelling.   Gastrointestinal: Positive for nausea, vomiting and abdominal pain. Negative for diarrhea.  Genitourinary: Negative for dysuria, flank pain and pelvic pain.  Musculoskeletal: Negative for arthralgias, myalgias, neck pain and neck stiffness.  Skin: Negative for rash.  Neurological: Negative for dizziness, weakness and headaches.  All other systems reviewed and are negative.      Allergies  Review of patient's allergies indicates no known allergies.  Home Medications   Current Outpatient Rx  Name  Route  Sig  Dispense  Refill  . bisacodyl (DULCOLAX) 10 MG suppository   Rectal   Place 1 suppository (10 mg total) rectally as needed for moderate constipation.   12 suppository   0   . cyclobenzaprine (FLEXERIL) 10 MG tablet   Oral   Take 10 mg by mouth 3 (three) times daily as needed for muscle spasms. For muscle spasms in back         . diclofenac sodium (VOLTAREN) 1 % GEL   Topical   Apply 1 g topically daily. To back and elbow         . dicyclomine (BENTYL) 20 MG tablet   Oral   Take 20 mg by mouth 3 (three) times daily as needed. For intestinal spasms         . diphenhydrAMINE (SOMINEX) 25 MG tablet   Oral   Take 25 mg by mouth 3 (three) times daily as needed for allergies or sleep.         . fluticasone (FLONASE) 50 MCG/ACT nasal  spray   Nasal   Place 2 sprays into the nose daily.         Marland Kitchen lamoTRIgine (LAMICTAL) 150 MG tablet   Oral   Take 150 mg by mouth daily.          . Multiple Vitamin (MULITIVITAMIN WITH MINERALS) TABS   Oral   Take 1 tablet by mouth daily.         Marland Kitchen perphenazine (TRILAFON) 4 MG tablet   Oral   Take 4 mg by mouth 3 (three) times daily.          . pramipexole (MIRAPEX) 0.125 MG tablet   Oral   Take 0.125 mg by mouth at bedtime.         . promethazine (PHENERGAN) 25 MG tablet   Oral   Take 25 mg by mouth every 6 (six) hours as needed. For nausea/vomiting         . pseudoephedrine (SUDAFED) 30 MG tablet   Oral    Take 30 mg by mouth every 4 (four) hours as needed. For allergies, (pollen, cat fur, cigarette smoke, dust)         . venlafaxine XR (EFFEXOR-XR) 75 MG 24 hr capsule   Oral   Take 75 mg by mouth daily.          BP 174/93  Pulse 66  Temp(Src) 97.6 F (36.4 C) (Oral)  Resp 20  Wt 185 lb (83.915 kg)  SpO2 100% Physical Exam  Nursing note and vitals reviewed. Constitutional: She appears well-developed and well-nourished. No distress.  HENT:  Head: Normocephalic.  Eyes: Conjunctivae are normal.  Neck: Neck supple.  Cardiovascular: Normal rate, regular rhythm and normal heart sounds.   Pulmonary/Chest: Effort normal and breath sounds normal. No respiratory distress. She has no wheezes. She has no rales.  Abdominal: Soft. Bowel sounds are normal. She exhibits no distension. There is no tenderness. There is no rebound and no guarding.  Musculoskeletal: She exhibits no edema.  Neurological: She is alert.  Skin: Skin is warm and dry.  Psychiatric: She has a normal mood and affect. Her behavior is normal.    ED Course  Procedures (including critical care time) Labs Review Labs Reviewed  CBC WITH DIFFERENTIAL - Abnormal; Notable for the following:    WBC 11.9 (*)    Hemoglobin 15.9 (*)    MCHC 36.4 (*)    Neutrophils Relative % 81 (*)    Neutro Abs 9.5 (*)    All other components within normal limits  COMPREHENSIVE METABOLIC PANEL - Abnormal; Notable for the following:    Glucose, Bld 115 (*)    Alkaline Phosphatase 134 (*)    All other components within normal limits  URINALYSIS, ROUTINE W REFLEX MICROSCOPIC - Abnormal; Notable for the following:    APPearance TURBID (*)    Hgb urine dipstick SMALL (*)    Leukocytes, UA SMALL (*)    All other components within normal limits  URINE MICROSCOPIC-ADD ON - Abnormal; Notable for the following:    Casts GRANULAR CAST (*)    All other components within normal limits  URINE CULTURE  LIPASE, BLOOD   Imaging Review No results  found.   EKG Interpretation None      MDM   Final diagnoses:  Nausea & vomiting    Patient with history of cyclical vomiting, here with persistent nausea vomiting. She does report epigastric abdominal pain, however exam is benign with no abdominal tenderness. Her abdomen is soft with normal  bowel sounds. I suspect this is an episode of her cyclical vomiting exacerbation. Will check labs, start fluids, will try to get nausea control.  12:18 PM PT is feeling better. Continues to have some nausea after zofran. Reglan is pending. Will continue fluids. Labs pending.   2:14 PM Pt is tolerating PO fluids. Complaining of some "heart burn." Will try GI cocktail. Pt received 2L of NS. Pt's labs all unremarkable. No CP or SOB. Suspect viral gastroenteritis vs cyclical vomiting. Will d/c home with phenergan suppositories. She has PO phenergan at home.   Medications  gi cocktail (Maalox,Lidocaine,Donnatal) (not administered)  sodium chloride 0.9 % bolus 1,000 mL (0 mLs Intravenous Stopped 01/26/14 1258)  ondansetron (ZOFRAN) injection 4 mg (4 mg Intravenous Given 01/26/14 1136)  morphine 4 MG/ML injection 4 mg (4 mg Intravenous Given 01/26/14 1136)  metoCLOPramide (REGLAN) injection 10 mg (10 mg Intravenous Given 01/26/14 1225)  sodium chloride 0.9 % bolus 1,000 mL (1,000 mLs Intravenous New Bag/Given 01/26/14 1300)   Filed Vitals:   01/26/14 0958 01/26/14 1223 01/26/14 1316 01/26/14 1348  BP: 174/93 115/67 138/68 128/66  Pulse: 66 88 90   Temp: 97.6 F (36.4 C)     TempSrc: Oral     Resp: 20   17  Weight: 185 lb (83.915 kg)     SpO2: 100% 98% 97% 100%     Sundra Haddix A Matsuko Kretz, PA-C 01/28/14 0020

## 2014-01-26 NOTE — Discharge Instructions (Signed)
Phenergan suppositories if unable to keep oral medications. Continue drinking fluids. Do not try solid food until later tonight or tomorrow as tolerate. Follow up with your doctor. Return if worsening.     Nausea and Vomiting Nausea is a sick feeling that often comes before throwing up (vomiting). Vomiting is a reflex where stomach contents come out of your mouth. Vomiting can cause severe loss of body fluids (dehydration). Children and elderly adults can become dehydrated quickly, especially if they also have diarrhea. Nausea and vomiting are symptoms of a condition or disease. It is important to find the cause of your symptoms. CAUSES   Direct irritation of the stomach lining. This irritation can result from increased acid production (gastroesophageal reflux disease), infection, food poisoning, taking certain medicines (such as nonsteroidal anti-inflammatory drugs), alcohol use, or tobacco use.  Signals from the brain.These signals could be caused by a headache, heat exposure, an inner ear disturbance, increased pressure in the brain from injury, infection, a tumor, or a concussion, pain, emotional stimulus, or metabolic problems.  An obstruction in the gastrointestinal tract (bowel obstruction).  Illnesses such as diabetes, hepatitis, gallbladder problems, appendicitis, kidney problems, cancer, sepsis, atypical symptoms of a heart attack, or eating disorders.  Medical treatments such as chemotherapy and radiation.  Receiving medicine that makes you sleep (general anesthetic) during surgery. DIAGNOSIS Your caregiver may ask for tests to be done if the problems do not improve after a few days. Tests may also be done if symptoms are severe or if the reason for the nausea and vomiting is not clear. Tests may include:  Urine tests.  Blood tests.  Stool tests.  Cultures (to look for evidence of infection).  X-rays or other imaging studies. Test results can help your caregiver make  decisions about treatment or the need for additional tests. TREATMENT You need to stay well hydrated. Drink frequently but in small amounts.You may wish to drink water, sports drinks, clear broth, or eat frozen ice pops or gelatin dessert to help stay hydrated.When you eat, eating slowly may help prevent nausea.There are also some antinausea medicines that may help prevent nausea. HOME CARE INSTRUCTIONS   Take all medicine as directed by your caregiver.  If you do not have an appetite, do not force yourself to eat. However, you must continue to drink fluids.  If you have an appetite, eat a normal diet unless your caregiver tells you differently.  Eat a variety of complex carbohydrates (rice, wheat, potatoes, bread), lean meats, yogurt, fruits, and vegetables.  Avoid high-fat foods because they are more difficult to digest.  Drink enough water and fluids to keep your urine clear or pale yellow.  If you are dehydrated, ask your caregiver for specific rehydration instructions. Signs of dehydration may include:  Severe thirst.  Dry lips and mouth.  Dizziness.  Dark urine.  Decreasing urine frequency and amount.  Confusion.  Rapid breathing or pulse. SEEK IMMEDIATE MEDICAL CARE IF:   You have blood or brown flecks (like coffee grounds) in your vomit.  You have black or bloody stools.  You have a severe headache or stiff neck.  You are confused.  You have severe abdominal pain.  You have chest pain or trouble breathing.  You do not urinate at least once every 8 hours.  You develop cold or clammy skin.  You continue to vomit for longer than 24 to 48 hours.  You have a fever. MAKE SURE YOU:   Understand these instructions.  Will watch your condition.  Will get help right away if you are not doing well or get worse. Document Released: 10/17/2005 Document Revised: 01/09/2012 Document Reviewed: 03/16/2011 [facility] Patient Information 2014 [place], [place].

## 2014-01-26 NOTE — ED Notes (Signed)
Pt getting into gown

## 2014-01-26 NOTE — ED Notes (Signed)
Pt in c/o n/v since this am, states she had a recent dx of cyclical vomiting syndrome, c/o abd cramping with this and soreness from the heaving

## 2014-01-27 LAB — URINE CULTURE

## 2014-01-28 NOTE — ED Provider Notes (Signed)
Medical screening examination/treatment/procedure(s) were performed by non-physician practitioner and as supervising physician I was immediately available for consultation/collaboration.   EKG Interpretation None        Tanna Furry, MD 01/28/14 [phone]

## 2014-01-29 ENCOUNTER — Other Ambulatory Visit: Payer: Self-pay | Admitting: Internal Medicine

## 2014-01-29 ENCOUNTER — Inpatient Hospital Stay (HOSPITAL_COMMUNITY)
Admission: EM | Admit: 2014-01-29 | Discharge: 2014-01-31 | DRG: 064 | Disposition: A | Discharge status: Home / Self Care | Payer: Medicare Other | Attending: Internal Medicine | Admitting: Internal Medicine

## 2014-01-29 ENCOUNTER — Emergency Department (HOSPITAL_COMMUNITY): Payer: Medicare Other

## 2014-01-29 ENCOUNTER — Inpatient Hospital Stay (HOSPITAL_COMMUNITY): Payer: Medicare Other

## 2014-01-29 ENCOUNTER — Encounter (HOSPITAL_COMMUNITY): Payer: Self-pay | Admitting: Emergency Medicine

## 2014-01-29 DIAGNOSIS — Z9079 Acquired absence of other genital organ(s): Secondary | ICD-10-CM

## 2014-01-29 DIAGNOSIS — I6350 Cerebral infarction due to unspecified occlusion or stenosis of unspecified cerebral artery: Principal | ICD-10-CM

## 2014-01-29 DIAGNOSIS — G936 Cerebral edema: Secondary | ICD-10-CM | POA: Diagnosis present

## 2014-01-29 DIAGNOSIS — R4789 Other speech disturbances: Secondary | ICD-10-CM | POA: Diagnosis present

## 2014-01-29 DIAGNOSIS — F1420 Cocaine dependence, uncomplicated: Secondary | ICD-10-CM

## 2014-01-29 DIAGNOSIS — R112 Nausea with vomiting, unspecified: Secondary | ICD-10-CM

## 2014-01-29 DIAGNOSIS — F3289 Other specified depressive episodes: Secondary | ICD-10-CM

## 2014-01-29 DIAGNOSIS — R921 Mammographic calcification found on diagnostic imaging of breast: Secondary | ICD-10-CM

## 2014-01-29 DIAGNOSIS — F329 Major depressive disorder, single episode, unspecified: Secondary | ICD-10-CM

## 2014-01-29 DIAGNOSIS — M542 Cervicalgia: Secondary | ICD-10-CM

## 2014-01-29 DIAGNOSIS — K219 Gastro-esophageal reflux disease without esophagitis: Secondary | ICD-10-CM

## 2014-01-29 DIAGNOSIS — F1910 Other psychoactive substance abuse, uncomplicated: Secondary | ICD-10-CM

## 2014-01-29 DIAGNOSIS — R1115 Cyclical vomiting syndrome unrelated to migraine: Secondary | ICD-10-CM

## 2014-01-29 DIAGNOSIS — F1210 Cannabis abuse, uncomplicated: Secondary | ICD-10-CM | POA: Diagnosis present

## 2014-01-29 DIAGNOSIS — K589 Irritable bowel syndrome without diarrhea: Secondary | ICD-10-CM | POA: Diagnosis present

## 2014-01-29 DIAGNOSIS — Z79899 Other long term (current) drug therapy: Secondary | ICD-10-CM

## 2014-01-29 DIAGNOSIS — R29898 Other symptoms and signs involving the musculoskeletal system: Secondary | ICD-10-CM | POA: Diagnosis present

## 2014-01-29 DIAGNOSIS — R29810 Facial weakness: Secondary | ICD-10-CM | POA: Diagnosis present

## 2014-01-29 DIAGNOSIS — I639 Cerebral infarction, unspecified: Secondary | ICD-10-CM | POA: Diagnosis present

## 2014-01-29 DIAGNOSIS — F319 Bipolar disorder, unspecified: Secondary | ICD-10-CM

## 2014-01-29 DIAGNOSIS — K5730 Diverticulosis of large intestine without perforation or abscess without bleeding: Secondary | ICD-10-CM | POA: Diagnosis present

## 2014-01-29 LAB — DIFFERENTIAL
BASOS ABS: 0.1 10*3/uL (ref 0.0–0.1)
BASOS PCT: 0 % (ref 0–1)
EOS ABS: 0.1 10*3/uL (ref 0.0–0.7)
Eosinophils Relative: 1 % (ref 0–5)
Lymphocytes Relative: 26 % (ref 12–46)
Lymphs Abs: 4.2 10*3/uL — ABNORMAL HIGH (ref 0.7–4.0)
Monocytes Absolute: 1.2 10*3/uL — ABNORMAL HIGH (ref 0.1–1.0)
Monocytes Relative: 7 % (ref 3–12)
NEUTROS ABS: 10.7 10*3/uL — AB (ref 1.7–7.7)
NEUTROS PCT: 66 % (ref 43–77)

## 2014-01-29 LAB — COMPREHENSIVE METABOLIC PANEL
ALBUMIN: 3.9 g/dL (ref 3.5–5.2)
ALK PHOS: 120 U/L — AB (ref 39–117)
ALT: 12 U/L (ref 0–35)
AST: 17 U/L (ref 0–37)
BUN: 18 mg/dL (ref 6–23)
CHLORIDE: 99 meq/L (ref 96–112)
CO2: 22 mEq/L (ref 19–32)
Calcium: 9.8 mg/dL (ref 8.4–10.5)
Creatinine, Ser: 0.71 mg/dL (ref 0.50–1.10)
GFR calc Af Amer: >90 mL/min (ref >90)
GFR calc non Af Amer: >90 mL/min (ref >90)
Glucose, Bld: 121 mg/dL — ABNORMAL HIGH (ref 70–99)
POTASSIUM: 4.1 meq/L (ref 3.7–5.3)
Sodium: 139 mEq/L (ref 137–147)
Total Protein: 7.2 g/dL (ref 6.0–8.3)

## 2014-01-29 LAB — CBC
HCT: 41.9 % (ref 36.0–46.0)
Hemoglobin: 14.9 g/dL (ref 12.0–15.0)
MCH: 33.3 pg (ref 26.0–34.0)
MCHC: 35.6 g/dL (ref 30.0–36.0)
MCV: 93.5 fL (ref 78.0–100.0)
PLATELETS: 316 10*3/uL (ref 150–400)
RBC: 4.48 MIL/uL (ref 3.87–5.11)
RDW: 14.4 % (ref 11.5–15.5)
WBC: 16.2 10*3/uL — ABNORMAL HIGH (ref 4.0–10.5)

## 2014-01-29 LAB — PROTIME-INR
INR: 1 (ref 0.00–1.49)
PROTHROMBIN TIME: 13 s (ref 11.6–15.2)

## 2014-01-29 LAB — CBG MONITORING, ED: GLUCOSE-CAPILLARY: 120 mg/dL — AB (ref 70–99)

## 2014-01-29 LAB — I-STAT TROPONIN, ED: TROPONIN I, POC: 0.01 ng/mL (ref 0.00–0.08)

## 2014-01-29 LAB — APTT: APTT: 33 s (ref 24–37)

## 2014-01-29 MED ORDER — LAMOTRIGINE 150 MG PO TABS
150.0000 mg | ORAL_TABLET | Freq: Every day | ORAL | Status: DC
  Administered 2014-01-30 – 2014-01-31 (×2): 150 mg via ORAL
  Filled 2014-01-29 (×2): qty 1

## 2014-01-29 MED ORDER — VENLAFAXINE HCL ER 75 MG PO CP24
75.0000 mg | ORAL_CAPSULE | Freq: Every day | ORAL | Status: DC
  Filled 2014-01-29: qty 1

## 2014-01-29 MED ORDER — FLUTICASONE PROPIONATE 50 MCG/ACT NA SUSP
2.0000 | Freq: Every day | NASAL | Status: DC
  Administered 2014-01-29 – 2014-01-31 (×3): 2 via NASAL
  Filled 2014-01-29: qty 16

## 2014-01-29 MED ORDER — SENNOSIDES-DOCUSATE SODIUM 8.6-50 MG PO TABS: 1.0000 | ORAL_TABLET | Freq: Every evening | ORAL | Status: DC | PRN

## 2014-01-29 MED ORDER — ASPIRIN 325 MG PO TABS
325.0000 mg | ORAL_TABLET | Freq: Every day | ORAL | Status: DC
  Administered 2014-01-30: 325 mg via ORAL
  Filled 2014-01-29: qty 1

## 2014-01-29 MED ORDER — PRAMIPEXOLE DIHYDROCHLORIDE 0.125 MG PO TABS
0.1250 mg | ORAL_TABLET | Freq: Every day | ORAL | Status: DC
  Administered 2014-01-29 – 2014-01-30 (×2): 0.125 mg via ORAL
  Filled 2014-01-29 (×4): qty 1

## 2014-01-29 MED ORDER — ENOXAPARIN SODIUM 40 MG/0.4ML ~~LOC~~ SOLN
40.0000 mg | SUBCUTANEOUS | Status: DC
  Administered 2014-01-29 – 2014-01-30 (×2): 40 mg via SUBCUTANEOUS
  Filled 2014-01-29 (×3): qty 0.4

## 2014-01-29 MED ORDER — DICYCLOMINE HCL 20 MG PO TABS
20.0000 mg | ORAL_TABLET | Freq: Three times a day (TID) | ORAL | Status: DC | PRN
  Administered 2014-01-30: 20 mg via ORAL
  Filled 2014-01-29 (×3): qty 1

## 2014-01-29 MED ORDER — ASPIRIN 300 MG RE SUPP
300.0000 mg | Freq: Every day | RECTAL | Status: DC
  Filled 2014-01-29 (×2): qty 1

## 2014-01-29 MED ORDER — ASPIRIN 325 MG PO TABS
325.0000 mg | ORAL_TABLET | Freq: Once | ORAL | Status: AC
  Administered 2014-01-29: 325 mg via ORAL
  Filled 2014-01-29: qty 1

## 2014-01-29 MED ORDER — ADULT MULTIVITAMIN W/MINERALS CH
1.0000 | ORAL_TABLET | Freq: Every day | ORAL | Status: DC
  Administered 2014-01-30 – 2014-01-31 (×2): 1 via ORAL
  Filled 2014-01-29 (×2): qty 1

## 2014-01-29 MED ORDER — VENLAFAXINE HCL ER 75 MG PO CP24
75.0000 mg | ORAL_CAPSULE | Freq: Every day | ORAL | Status: DC
  Administered 2014-01-30 – 2014-01-31 (×2): 75 mg via ORAL
  Filled 2014-01-29 (×2): qty 1

## 2014-01-29 MED ORDER — PERPHENAZINE 4 MG PO TABS
4.0000 mg | ORAL_TABLET | Freq: Three times a day (TID) | ORAL | Status: DC
  Administered 2014-01-29 – 2014-01-31 (×5): 4 mg via ORAL
  Filled 2014-01-29 (×8): qty 1

## 2014-01-29 NOTE — ED Notes (Signed)
Patient arrived via [facility] from home with a witnessed stroke like symptoms. Patient began to have slurred speech, right sided weakness, with slight right facial droop. Upon EMS arrival patient was back to normal. Upon arrival to ED patient had developed some expressive aphasia with right sided facial droop.

## 2014-01-29 NOTE — Code Documentation (Signed)
Per patient, she had an episode of difficulty word finding 2 days ago that lasted about 5-15 min.  Today at 1715 she had another episode of difficulty word finding, facial droop and right side weakness.  She resolved in route to hospital.  While in the ED she again had similar symptoms.  Code Stroke called at [facility] ct done, labs drawn. NIHSS 4 Right facial droop, Right leg drift, Right sensor loss, dysarthria.  Dr Nicole Kindred at bedside to assess patient.

## 2014-01-29 NOTE — ED Notes (Signed)
Notified [facility] to Activate Code Stroke

## 2014-01-29 NOTE — Consult Note (Signed)
Referring Physician: Dr. Darl Householder    Chief Complaint: Acute onset of slurred speech and right facial droop.  HPI: [name] is an 58 y.o. female with a history of irritable bowel syndrome, diverticulitis and bipolar affective disorder, who was brought to the emergency room following an episode of acute onset of right facial droop as well as slurred speech. Symptoms cleared in route but recurred after arriving in the emergency room. Patient also gives a history of an episode 2 days ago of transient word finding difficulty. She has no previous history of stroke nor TIA. She has not been on antiplatelet therapy. 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 Mass lesion could not be ruled out although cytotoxic edema is more likely. NIH stroke score was 4. Patient was not deemed a candidate for TPA because of the left parietal lesion as well as low NIH score.  LSN: 5:15 PM on 01/29/2014 tPA Given: No: Acute left parietal stroke; NIH stroke score 4. MRankin: 1  Past Medical History  Diagnosis Date  . Hiatal hernia   . IBS (irritable bowel syndrome)   . Diverticulitis   . Bipolar affective disorder   . GERD (gastroesophageal reflux disease)     History reviewed. No pertinent family history.   Medications: I have reviewed the patient's current medications.  ROS: History obtained from the patient  General ROS: negative for - chills, fatigue, fever, night sweats, weight gain or weight loss Psychological ROS: negative for - behavioral disorder, hallucinations, memory difficulties, mood swings or suicidal ideation Ophthalmic ROS: negative for - blurry vision, double vision, eye pain or loss of vision ENT ROS: negative for - epistaxis, nasal discharge, oral lesions, sore throat, tinnitus or vertigo Allergy and Immunology ROS: negative for - hives or itchy/watery eyes Hematological and Lymphatic ROS: negative for - bleeding problems, bruising or  swollen lymph nodes Endocrine ROS: negative for - galactorrhea, hair pattern changes, polydipsia/polyuria or temperature intolerance Respiratory ROS: negative for - cough, hemoptysis, shortness of breath or wheezing Cardiovascular ROS: negative for - chest pain, dyspnea on exertion, edema or irregular heartbeat Gastrointestinal ROS: negative for - abdominal pain, diarrhea, hematemesis, nausea/vomiting or stool incontinence Genito-Urinary ROS: negative for - dysuria, hematuria, incontinence or urinary frequency/urgency Musculoskeletal ROS: negative for - joint swelling or muscular weakness Neurological ROS: as noted in HPI Dermatological ROS: negative for rash and skin lesion changes  Physical Examination: Blood pressure 140/102, pulse 103, resp. rate 16, SpO2 98.00%.  Neurologic Examination: Mental Status: Alert, oriented, moderately anxious.  Speech was slightly slurred without evidence of aphasia. Able to follow commands without difficulty. Cranial Nerves: II-Visual fields were normal. III/IV/VI-Pupils were equal and reacted. Extraocular movements were full and conjugate.    V/VII-slightly reduced perception of tactile sensation over the right side of the face compared to left; mild/moderate right lower facial weakness. VIII-normal. X-mild dysarthria. Motor: Mild drift of right lower extremity; motor exam otherwise unremarkable Sensory: Reduced perception of tactile sensation over right extremities compared to left extremities. Deep Tendon Reflexes: 2+ and symmetric. Plantars: Flexor bilaterally Cerebellar: Normal finger-to-nose testing. Carotid auscultation: Normal  Ct Head (brain) Wo Contrast  01/29/2014   CLINICAL DATA:  Facial droop, trouble speaking  EXAM: CT HEAD WITHOUT CONTRAST  TECHNIQUE: Contiguous axial images were obtained from the base of the skull through the vertex without intravenous contrast.  COMPARISON:  None.  FINDINGS: Focal hypoattenuation in the left parietal  cortex extending to the gray-white interface consistent with cytotoxic edema in  the setting of acute/ subacute infarct. However, the abnormality is a somewhat rounded with a punctate focus of higher attenuation centrally. An underlying lesion with surrounding vasogenic edema is not excluded entirely. Acute intracranial hemorrhage, significant mass effect or midline shift. No hydrocephalus. Globes and orbits are intact and unremarkable bilaterally. No acute soft tissue or calvarial abnormality. Bilateral mastoid air cells and paranasal sinuses are unremarkable.  IMPRESSION: Left parietal cortical/subcortical infarct versus mass with surrounding edema. The appearance is indeterminate. The edema pattern suggests cytotoxic edema and which would be consistent with acute infarct, however the morphology is quite rounded which makes an underlying mass is difficult to exclude entirely. No intracranial hemorrhage, significant mass effect or midline shift.  MRI of the brain with and without contrast could further evaluate.  Critical Value/emergent results were called by telephone at the time of interpretation on 01/29/2014 at 7:02 PM to Dr. Nicole Kindred, who verbally acknowledged these results.   Electronically Signed   By: Jacqulynn Cadet M.D.   On: 01/29/2014 19:03    Assessment: 58 y.o. 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  Stroke Risk Factors - none  Plan: 1. HgbA1c, fasting lipid panel 2. MRI, MRA  of the brain without contrast 3. PT consult, OT consult, Speech consult 4. Echocardiogram 5. Carotid dopplers 6. Prophylactic therapy-Antiplatelet med: Aspirin  7. Risk factor modification 8. Telemetry monitoring   C.R. Nicole Kindred, MD [facility] [phone]  01/29/2014, 7:14 PM

## 2014-01-29 NOTE — ED Provider Notes (Signed)
CSN: [id number]     Arrival date & time 01/29/14  1815 History   First MD Initiated Contact with Patient 01/29/14 [place]     Chief Complaint  Patient presents with  . Code Stroke    An emergency department physician performed an initial assessment on this suspected stroke patient at 29. (Consider location/radiation/quality/duration/timing/severity/associated sxs/prior Treatment) The history is provided by the patient.  [name] is a 58 y.o. female hx of IBS, GERD here with stroke like symptoms. She had an episode of slurred speech yesterday that resolved. 4 PM today she had acute onset of slurred speech as well as right-sided weakness and right facial droop. Symptoms improved afterwards and upon arrival to the ER the symptoms have worsened. Denies any history of stroke.   Past Medical History  Diagnosis Date  . Hiatal hernia   . IBS (irritable bowel syndrome)   . Diverticulitis   . Bipolar affective disorder   . GERD (gastroesophageal reflux disease)    Past Surgical History  Procedure Laterality Date  . Abdominal hysterectomy    . Breast lumpectomy     History reviewed. No pertinent family history. History  Substance Use Topics  . Smoking status: Never Smoker   . Smokeless tobacco: Not on file  . Alcohol Use: No   OB History   Grav Para Term Preterm Abortions TAB SAB Ect Mult Living                 Review of Systems  Neurological: Positive for facial asymmetry and weakness.  All other systems reviewed and are negative.      Allergies  Review of patient's allergies indicates no known allergies.  Home Medications   Current Outpatient Rx  Name  Route  Sig  Dispense  Refill  . cyclobenzaprine (FLEXERIL) 10 MG tablet   Oral   Take 10 mg by mouth 3 (three) times daily as needed for muscle spasms. For muscle spasms in back         . diclofenac sodium (VOLTAREN) 1 % GEL   Topical   Apply 1 g topically daily. To back and elbow         . dicyclomine  (BENTYL) 20 MG tablet   Oral   Take 20 mg by mouth 3 (three) times daily as needed. For intestinal spasms         . diphenhydrAMINE (SOMINEX) 25 MG tablet   Oral   Take 25 mg by mouth 3 (three) times daily as needed for allergies or sleep.         . fluticasone (FLONASE) 50 MCG/ACT nasal spray   Nasal   Place 2 sprays into the nose daily.         Marland Kitchen lamoTRIgine (LAMICTAL) 150 MG tablet   Oral   Take 150 mg by mouth daily.          . Multiple Vitamin (MULITIVITAMIN WITH MINERALS) TABS   Oral   Take 1 tablet by mouth daily.         Marland Kitchen perphenazine (TRILAFON) 4 MG tablet   Oral   Take 4 mg by mouth 3 (three) times daily.          . pramipexole (MIRAPEX) 0.125 MG tablet   Oral   Take 0.125 mg by mouth at bedtime.         . promethazine (PHENERGAN) 25 MG suppository   Rectal   Place 1 suppository (25 mg total) rectally every 6 (six) hours as needed  for nausea or vomiting.   15 each   0   . promethazine (PHENERGAN) 25 MG tablet   Oral   Take 25 mg by mouth every 6 (six) hours as needed. For nausea/vomiting         . venlafaxine XR (EFFEXOR-XR) 75 MG 24 hr capsule   Oral   Take 75 mg by mouth daily.          BP 95/68  Pulse 99  Temp(Src) 97.6 F (36.4 C)  Resp 23  SpO2 96% Physical Exam  Nursing note and vitals reviewed. Constitutional: She is oriented to person, place, and time.  Uncomfortable, expressive aphasia   HENT:  Head: Normocephalic.  Mouth/Throat: Oropharynx is clear and moist.  Eyes: Conjunctivae and EOM are normal. Pupils are equal, round, and reactive to light.  Neck: Normal range of motion. Neck supple.  Cardiovascular: Normal rate, regular rhythm and normal heart sounds.   Pulmonary/Chest: Effort normal and breath sounds normal. No respiratory distress. She has no wheezes. She has no rales.  Abdominal: Soft. Bowel sounds are normal. She exhibits no distension. There is no tenderness. There is no rebound and no guarding.   Musculoskeletal: Normal range of motion.  Neurological: She is alert and oriented to person, place, and time.  Expressive aphasia, R facial droop. 4/5 strength R side, 5/5 L side   Skin: Skin is warm.  Psychiatric: She has a normal mood and affect. Her behavior is normal. Judgment and thought content normal.    ED Course  Procedures (including critical care time) Labs Review Labs Reviewed  CBC - Abnormal; Notable for the following:    WBC 16.2 (*)    All other components within normal limits  DIFFERENTIAL - Abnormal; Notable for the following:    Neutro Abs 10.7 (*)    Lymphs Abs 4.2 (*)    Monocytes Absolute 1.2 (*)    All other components within normal limits  COMPREHENSIVE METABOLIC PANEL - Abnormal; Notable for the following:    Glucose, Bld 121 (*)    Alkaline Phosphatase 120 (*)    Total Bilirubin <0.2 (*)    All other components within normal limits  CBG MONITORING, ED - Abnormal; Notable for the following:    Glucose-Capillary 120 (*)    All other components within normal limits  PROTIME-INR  APTT  I-STAT TROPOININ, ED   Imaging Review Ct Head (brain) Wo Contrast  01/29/2014   CLINICAL DATA:  Facial droop, trouble speaking  EXAM: CT HEAD WITHOUT CONTRAST  TECHNIQUE: Contiguous axial images were obtained from the base of the skull through the vertex without intravenous contrast.  COMPARISON:  None.  FINDINGS: Focal hypoattenuation in the left parietal cortex extending to the gray-white interface consistent with cytotoxic edema in the setting of acute/ subacute infarct. However, the abnormality is a somewhat rounded with a punctate focus of higher attenuation centrally. An underlying lesion with surrounding vasogenic edema is not excluded entirely. Acute intracranial hemorrhage, significant mass effect or midline shift. No hydrocephalus. Globes and orbits are intact and unremarkable bilaterally. No acute soft tissue or calvarial abnormality. Bilateral mastoid air cells and  paranasal sinuses are unremarkable.  IMPRESSION: Left parietal cortical/subcortical infarct versus mass with surrounding edema. The appearance is indeterminate. The edema pattern suggests cytotoxic edema and which would be consistent with acute infarct, however the morphology is quite rounded which makes an underlying mass is difficult to exclude entirely. No intracranial hemorrhage, significant mass effect or midline shift.  MRI of the brain  with and without contrast could further evaluate.  Critical Value/emergent results were called by telephone at the time of interpretation on 01/29/2014 at 7:02 PM to Dr. Nicole Kindred, who verbally acknowledged these results.   Electronically Signed   By: Jacqulynn Cadet M.D.   On: 01/29/2014 19:03     EKG Interpretation   Date/Time:  Wednesday January 29 2014 18:30:26 EDT Ventricular Rate:  106 PR Interval:  154 QRS Duration: 92 QT Interval:  336 QTC Calculation: 446 R Axis:   55 Text Interpretation:  Sinus tachycardia Since last tracing rate faster  Confirmed by Rolan Wrightsman  MD, Deara Bober ([id number]) on 01/29/2014 6:34:09 PM      MDM   Final diagnoses:  CVA (cerebral infarction)   [name] is a 58 y.o. female here with possible stroke. Given onset 4 pm today, code stroke called. Neuro at bedside and NIH was 3 and CT showed possible mass so no TPA given. I called Dr. Arnoldo Morale, who reviewed the image and doesn't feel that she has mass, likely infarct. Will admit to tele under hospitalist.      Wandra Arthurs, MD 01/29/14 2031

## 2014-01-29 NOTE — H&P (Signed)
Do Not Administer Blood Pressure Meds or Nitroglycerins (including Imdur) if SBP < 100 [facility] History and Physical  [name] HYQ:[id number] DOB: 02-16-1956 DOA: 01/29/2014  Referring physician:  PCP: Philis Fendt, MD  Specialists:   Chief Complaint:   HPI: [name] is a 58 y.o. female with a history of Bipolar disorder , IBS, and GERD who presents to the ED with complaints of  2 days of difficulty with her speech and Left sided Facial droop with right sided weakness.  She also had trouble walking due to weakness of her right side.   Since she is having difficulty with her speech, her husband is assisting her with the history . The symptoms began yesterday lasted for up to 15 mijutes then resolved but recurred again today at 4 pm.   She reports that the symptoms returned and were not resolving.   She also had headaches and dizziness and confusion 2 week ago and was unable to talk.  The symptoms resolved at that time.     In the Ed, she was evaluated and was found to have a Left Parietal Lobe Acute to Subacute Infarct  On CT scan.  When she arrived to the Ed a Code Stroke was called, and she was evaluated by Neuro Dr. Nicole Kindred, and her NIH score was 3, and she was not deemed to be appropriate for TPA administration.  She was referred for further workup and risk stratification.      Review of Systems:  Constitutional: No Weight Loss, No Weight Gain, Night Sweats, Fevers, Chills, Fatigue, or Generalized Weakness HEENT: +Headaches, Difficulty Swallowing,Tooth/Dental Problems,Sore Throat,  No Sneezing, Rhinitis, Ear Ache, Nasal Congestion, or Post Nasal Drip,  Cardio-vascular:  No Chest pain, Orthopnea, PND, Edema in lower extremities, Anasarca, Dizziness, Palpitations  Resp: No Dyspnea, No DOE, No Cough, No Hemoptysis, No Wheezing.    GI: No Heartburn, Indigestion, Abdominal Pain, +Nausea, +Vomiting, Diarrhea, Change in Bowel Habits,  Loss of Appetite  GU: No Dysuria,  Change in Color of Urine, No Urgency or Frequency.  No flank pain.  Musculoskeletal: No Joint Pain or Swelling.  No Decreased Range of Motion. No Back Pain.  Neurologic: No Syncope, No Seizures, +Right Sided Weakness, Paresthesia, Vision Disturbance or Loss, No Diplopia, No Vertigo, +Difficulty Walking,  Skin: No Rash or Lesions. Psych: No Change in Mood or Affect, +Depression or Anxiety. No Memory loss. No Confusion or Hallucinations   Past Medical History  Diagnosis Date  . Hiatal hernia   . IBS (irritable bowel syndrome)   . Diverticulitis   . Bipolar affective disorder   . GERD (gastroesophageal reflux disease)      Past Surgical History  Procedure Laterality Date  . Abdominal hysterectomy    . Breast lumpectomy        Prior to Admission medications   Medication Sig Start Date End Date Taking? Authorizing Provider  cyclobenzaprine (FLEXERIL) 10 MG tablet Take 10 mg by mouth 3 (three) times daily as needed for muscle spasms. For muscle spasms in back   Yes Historical Provider, MD  diclofenac sodium (VOLTAREN) 1 % GEL Apply 1 g topically daily. To back and elbow   Yes Historical Provider, MD  dicyclomine (BENTYL) 20 MG tablet Take 20 mg by mouth 3 (three) times daily as needed. For intestinal spasms   Yes Historical Provider, MD  diphenhydrAMINE (SOMINEX) 25 MG tablet Take 25 mg by mouth 3 (three) times daily as needed for allergies or sleep.   Yes Historical  Provider, MD  fluticasone (FLONASE) 50 MCG/ACT nasal spray Place 2 sprays into the nose daily.   Yes Historical Provider, MD  lamoTRIgine (LAMICTAL) 150 MG tablet Take 150 mg by mouth daily.    Yes Historical Provider, MD  Multiple Vitamin (MULITIVITAMIN WITH MINERALS) TABS Take 1 tablet by mouth daily.   Yes Historical Provider, MD  perphenazine (TRILAFON) 4 MG tablet Take 4 mg by mouth 3 (three) times daily.    Yes Historical Provider, MD  pramipexole (MIRAPEX) 0.125 MG tablet Take 0.125 mg by mouth at bedtime.   Yes  Historical Provider, MD  promethazine (PHENERGAN) 25 MG suppository Place 1 suppository (25 mg total) rectally every 6 (six) hours as needed for nausea or vomiting. 01/26/14  Yes Tatyana A Kirichenko, PA-C  promethazine (PHENERGAN) 25 MG tablet Take 25 mg by mouth every 6 (six) hours as needed. For nausea/vomiting   Yes Historical Provider, MD  venlafaxine XR (EFFEXOR-XR) 75 MG 24 hr capsule Take 75 mg by mouth daily.   Yes Historical Provider, MD      No Known Allergies   Social History:  reports that she has never smoked. She does not have any smokeless tobacco history on file. She reports that she uses illicit drugs (Marijuana). She reports that she does not drink alcohol.     History reviewed. No pertinent family history.     Physical Exam:  GEN:  Pleasant Well Nourished and Well Developed  58 y.o. Caucasian female examined  and in no acute distress; cooperative with exam Filed Vitals:   01/29/14 1916 01/29/14 1930 01/29/14 1952 01/29/14 2045  BP: 95/68 126/91  136/80  Pulse: 99 93  80  Temp:   97.6 F (36.4 C)   Resp: 23 18  22   SpO2: 96% 96%  94%   Blood pressure 136/80, pulse 80, temperature 97.6 F (36.4 C), resp. rate 22, SpO2 94.00%. PSYCH: SHe is alert and oriented x4; does not appear anxious does not appear depressed; affect is normal HEENT: Normocephalic and Atraumatic, Mucous membranes pink; PERRLA; EOM intact; Fundi:  Benign;  No scleral icterus, Nares: Patent, Oropharynx: Clear, Fair Dentition, Neck:  FROM, no cervical lymphadenopathy nor thyromegaly or carotid bruit; no JVD; Breasts:: Not examined CHEST WALL: No tenderness CHEST: Normal respiration, clear to auscultation bilaterally HEART: Regular rate and rhythm; no murmurs rubs or gallops BACK: No kyphosis or scoliosis; no CVA tenderness ABDOMEN: Positive Bowel Sounds, soft non-tender; no masses, no organomegaly Rectal Exam: Not done EXTREMITIES: No cyanosis, clubbing or edema; no ulcerations. Genitalia: not  examined PULSES: 2+ and symmetric SKIN: Normal hydration no rash or ulceration  CNS:  Alert and Oriented x 4,   Speech : Dysarthric with + Expressive Aphasia, Able to follow 3 step commands without difficulty    Cranial Nerves: II: Discs flat bilaterally; Visual fields Intact,  No Decreased peripheral vision to the left or right. Pupils equal and reactive.  III,IV, VI: No ptosis, extra-ocular motions intact bilaterally  V,VII: Smile Asymmetric   Left Facial Droop, facial light touch sensation normal bilaterally  VIII: hearing Intact bilaterally  IX,X: gag reflex present  XI: bilateral shoulder shrug:  Weak on Right XII: midline tongue extension   Motor:  Right : Upper extremity 4/5   Left: Upper extremity 5/5               Right:  Lower extremity 4/5   Left Lower extremity 5/5  Tone and bulk:normal tone throughout; no atrophy noted   Sensory: Pinprick and  light touch intact throughout, bilaterally   Deep Tendon Reflexes: 2+ and symmetric throughout   Plantars/ Babinski: Right: upgoing    Left: normal    Cerebellar:  Finger to nose with or without difficulty.   Gait: deferred   Vascular: pulses palpable throughout    Labs on Admission:  Basic Metabolic Panel:  Recent Labs Lab 01/26/14 1117 01/29/14 1902  NA 138 139  K 4.4 4.1  CL 97 99  CO2 24 22  GLUCOSE 115* 121*  BUN 14 18  CREATININE 0.51 0.71  CALCIUM 9.4 9.8   Liver Function Tests:  Recent Labs Lab 01/26/14 1117 01/29/14 1902  AST 34 17  ALT 15 12  ALKPHOS 134* 120*  BILITOT 0.3 <0.2*  PROT 7.7 7.2  ALBUMIN 4.1 3.9    Recent Labs Lab 01/26/14 1117  LIPASE 17   No results found for this basename: AMMONIA,  in the last 168 hours CBC:  Recent Labs Lab 01/26/14 1117 01/29/14 1902  WBC 11.9* 16.2*  NEUTROABS 9.5* 10.7*  HGB 15.9* 14.9  HCT 43.7 41.9  MCV 92.6 93.5  PLT 358 316   Cardiac Enzymes: No results found for this basename: CKTOTAL, CKMB, CKMBINDEX, TROPONINI,  in the last 168  hours  BNP (last 3 results) No results found for this basename: PROBNP,  in the last 8760 hours CBG:  Recent Labs Lab 01/29/14 1855  GLUCAP 120*    Radiological Exams on Admission: Ct Head (brain) Wo Contrast  01/29/2014   CLINICAL DATA:  Facial droop, trouble speaking  EXAM: CT HEAD WITHOUT CONTRAST  TECHNIQUE: Contiguous axial images were obtained from the base of the skull through the vertex without intravenous contrast.  COMPARISON:  None.  FINDINGS: Focal hypoattenuation in the left parietal cortex extending to the gray-white interface consistent with cytotoxic edema in the setting of acute/ subacute infarct. However, the abnormality is a somewhat rounded with a punctate focus of higher attenuation centrally. An underlying lesion with surrounding vasogenic edema is not excluded entirely. Acute intracranial hemorrhage, significant mass effect or midline shift. No hydrocephalus. Globes and orbits are intact and unremarkable bilaterally. No acute soft tissue or calvarial abnormality. Bilateral mastoid air cells and paranasal sinuses are unremarkable.  IMPRESSION: Left parietal cortical/subcortical infarct versus mass with surrounding edema. The appearance is indeterminate. The edema pattern suggests cytotoxic edema and which would be consistent with acute infarct, however the morphology is quite rounded which makes an underlying mass is difficult to exclude entirely. No intracranial hemorrhage, significant mass effect or midline shift.  MRI of the brain with and without contrast could further evaluate.  Critical Value/emergent results were called by telephone at the time of interpretation on 01/29/2014 at 7:02 PM to Dr. Nicole Kindred, who verbally acknowledged these results.   Electronically Signed   By: Jacqulynn Cadet M.D.   On: 01/29/2014 19:03      EKG: Independently reviewed.     Assessment/Plan:   58 y.o. female with  Principal Problem:   CVA (cerebral infarction) Active Problems:    Bipolar affective disorder   GERD (gastroesophageal reflux disease)   Cyclical vomiting   Polysubstance abuse    1.   CVA- On CT scan,   CVA protocol ordered , Neuro checks, and Monitor on Telemetry,  ASA Rx, and Check Fasting Lipids, UDS, and HBA1c Level, MRI/MRA of Brain In AM, and Carotid US and 2D ECHO.  PT/OT/ and Speech consults in AM.   Seen by Neuro in ED ; Dr. Nicole Kindred.  2.   Bipolar and Depression-  On Effexor rX.  3.   Polysubstance Abuse hx- check UDS.    4.   Cyclical Vomiting  Hx -  Anti -Emetics PRN.    5.    GERD- Protonix.    6.  DVT Prophylaxis with Lovenox.      Code Status:      FULL CODE Family Communication:    Husband at Bedside Disposition Plan:    Inpatient      Time spent:  [facility] Pager [phone]  If 7PM-7AM, please contact night-coverage [email or web address] Password [facility] 01/29/2014, 9:31 PM

## 2014-01-30 ENCOUNTER — Inpatient Hospital Stay (HOSPITAL_COMMUNITY): Payer: Medicare Other

## 2014-01-30 DIAGNOSIS — I517 Cardiomegaly: Secondary | ICD-10-CM

## 2014-01-30 DIAGNOSIS — M542 Cervicalgia: Secondary | ICD-10-CM

## 2014-01-30 DIAGNOSIS — F319 Bipolar disorder, unspecified: Secondary | ICD-10-CM

## 2014-01-30 LAB — LIPID PANEL
Cholesterol: 168 mg/dL (ref 0–200)
HDL: 53 mg/dL (ref >39)
LDL CALC: 81 mg/dL (ref 0–99)
Total CHOL/HDL Ratio: 3.2 RATIO
Triglycerides: 168 mg/dL — ABNORMAL HIGH (ref <150)
VLDL: 34 mg/dL (ref 0–40)

## 2014-01-30 LAB — HEMOGLOBIN A1C
Hgb A1c MFr Bld: 5.3 % (ref <5.7)
Mean Plasma Glucose: 105 mg/dL (ref <117)

## 2014-01-30 LAB — RAPID URINE DRUG SCREEN, HOSP PERFORMED
AMPHETAMINES: NOT DETECTED
BARBITURATES: POSITIVE — AB
BENZODIAZEPINES: NOT DETECTED
Cocaine: NOT DETECTED
Opiates: NOT DETECTED
Tetrahydrocannabinol: POSITIVE — AB

## 2014-01-30 MED ORDER — ONDANSETRON HCL 4 MG/2ML IJ SOLN
INTRAMUSCULAR | Status: AC
  Administered 2014-01-30: 4 mg via INTRAVENOUS
  Filled 2014-01-30: qty 2

## 2014-01-30 MED ORDER — STUDY - INVESTIGATIONAL DRUG SIMPLE RECORD
180.0000 mg | Freq: Once | Status: AC
  Administered 2014-01-30: 180 mg via ORAL
  Filled 2014-01-30: qty 180

## 2014-01-30 MED ORDER — STUDY - INVESTIGATIONAL DRUG SIMPLE RECORD
90.0000 mg | Freq: Two times a day (BID) | Status: DC
  Administered 2014-01-30 – 2014-01-31 (×2): 90 mg via ORAL
  Filled 2014-01-30 (×3): qty 90

## 2014-01-30 MED ORDER — PROCHLORPERAZINE EDISYLATE 5 MG/ML IJ SOLN
10.0000 mg | Freq: Four times a day (QID) | INTRAMUSCULAR | Status: DC | PRN
  Administered 2014-01-30: 10 mg via INTRAVENOUS
  Filled 2014-01-30: qty 2

## 2014-01-30 MED ORDER — ONDANSETRON HCL 4 MG/2ML IJ SOLN
4.0000 mg | Freq: Four times a day (QID) | INTRAMUSCULAR | Status: DC | PRN
  Administered 2014-01-30 (×4): 4 mg via INTRAVENOUS
  Filled 2014-01-30 (×3): qty 2

## 2014-01-30 MED ORDER — STUDY - INVESTIGATIONAL DRUG SIMPLE RECORD
100.0000 mg | Freq: Every day | Status: DC
  Administered 2014-01-31: 100 mg via ORAL
  Filled 2014-01-30 (×2): qty 100

## 2014-01-30 MED ORDER — PROMETHAZINE HCL 25 MG/ML IJ SOLN
25.0000 mg | Freq: Once | INTRAMUSCULAR | Status: AC
  Administered 2014-01-30: 25 mg via INTRAVENOUS
  Filled 2014-01-30 (×2): qty 1

## 2014-01-30 MED ORDER — ACETAMINOPHEN 325 MG PO TABS
650.0000 mg | ORAL_TABLET | Freq: Four times a day (QID) | ORAL | Status: DC | PRN
  Administered 2014-01-30: 650 mg via ORAL
  Filled 2014-01-30: qty 2

## 2014-01-30 MED ORDER — STUDY - INVESTIGATIONAL DRUG SIMPLE RECORD
300.0000 mg | Freq: Once | Status: AC
Start: 2014-01-30 — End: 2014-01-30
  Administered 2014-01-30: 300 mg via ORAL
  Filled 2014-01-30: qty 300

## 2014-01-30 NOTE — Progress Notes (Signed)
MRI result called to this writer and MD' office called to notify.

## 2014-01-30 NOTE — Progress Notes (Signed)
Utilization review completed. Germain Koopmann, RN, BSN. 

## 2014-01-30 NOTE — Progress Notes (Signed)
0100 text paged Dr. Leonel Ramsay regarding pt presenting with sudden onset nausea and vomiting. Pt neuro status unchanged. Alert and oriented x4. Pupils 5 round and sluggish. Moves all extremities with equal strength of 5/5. Pt denies blurred vision and headache. Pt states she came to the ED on 3/29 for similar episodes of nausea and vomiting. Orders given to administer 4 mg of Zofran. PRN Bentyl also administered.

## 2014-01-30 NOTE — Progress Notes (Signed)
Stroke Team Progress Note  HISTORY [name] is an 58 y.o. female with a history of irritable bowel syndrome, diverticulitis and bipolar affective disorder, who was brought to the emergency room following an episode of acute onset of right facial droop as well as slurred speech. Symptoms cleared in route but recurred after arriving in the emergency room. Patient also gives a history of an episode 2 days ago of transient word finding difficulty. She has no previous history of stroke nor TIA. She has not been on antiplatelet therapy. 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 Mass lesion could not be ruled out although cytotoxic edema is more likely. NIH stroke score was 4. Patient was not deemed a candidate for TPA secondary to stroke within past 90 days. She was admitted  for further evaluation and treatment.  SUBJECTIVE Her husband is at the bedside.  Overall she feels her condition is worse - she is mainly complaining of nausea.  OBJECTIVE Most recent Vital Signs: Filed Vitals:   01/30/14 0135 01/30/14 0325 01/30/14 0519 01/30/14 0715  BP: 157/108 167/84 159/71 168/97  Pulse: 79 75 75 70  Temp: 97.6 F (36.4 C) 97.5 F (36.4 C) 97.9 F (36.6 C) 98.9 F (37.2 C)  TempSrc: Oral Oral Oral Axillary  Resp: 20 20 20 20   Height:      Weight:      SpO2: 97% 99% 96% 100%   CBG (last 3)   Recent Labs  01/29/14 1855  GLUCAP 120*    IV Fluid Intake:     MEDICATIONS  . aspirin  300 mg Rectal Daily   Or  . aspirin  325 mg Oral Daily  . enoxaparin (LOVENOX) injection  40 mg Subcutaneous Q24H  . fluticasone  2 spray Each Nare Daily  . lamoTRIgine  150 mg Oral Daily  . multivitamin with minerals  1 tablet Oral Daily  . perphenazine  4 mg Oral TID  . pramipexole  0.125 mg Oral QHS  . venlafaxine XR  75 mg Oral Daily   PRN:  dicyclomine, ondansetron (ZOFRAN) IV, prochlorperazine, senna-docusate  Diet:  Cardiac thin  liquids Activity:   DVT Prophylaxis:  Lovenox 40 mg sq daily   CLINICALLY SIGNIFICANT STUDIES Basic Metabolic Panel:   Recent Labs Lab 01/26/14 1117 01/29/14 1902  NA 138 139  K 4.4 4.1  CL 97 99  CO2 24 22  GLUCOSE 115* 121*  BUN 14 18  CREATININE 0.51 0.71  CALCIUM 9.4 9.8   Liver Function Tests:   Recent Labs Lab 01/26/14 1117 01/29/14 1902  AST 34 17  ALT 15 12  ALKPHOS 134* 120*  BILITOT 0.3 <0.2*  PROT 7.7 7.2  ALBUMIN 4.1 3.9   CBC:   Recent Labs Lab 01/26/14 1117 01/29/14 1902  WBC 11.9* 16.2*  NEUTROABS 9.5* 10.7*  HGB 15.9* 14.9  HCT 43.7 41.9  MCV 92.6 93.5  PLT 358 316   Coagulation:   Recent Labs Lab 01/29/14 1902  LABPROT 13.0  INR 1.00   Cardiac Enzymes: No results found for this basename: CKTOTAL, CKMB, CKMBINDEX, TROPONINI,  in the last 168 hours Urinalysis:   Recent Labs Lab 01/26/14 1315  COLORURINE YELLOW  LABSPEC 1.021  PHURINE 7.0  GLUCOSEU NEGATIVE  HGBUR SMALL*  BILIRUBINUR NEGATIVE  KETONESUR NEGATIVE  PROTEINUR NEGATIVE  UROBILINOGEN 0.2  NITRITE NEGATIVE  LEUKOCYTESUR SMALL*   Lipid Panel    Component Value Date/Time   CHOL 168 01/30/2014 0617  TRIG 168* 01/30/2014 0617   HDL 53 01/30/2014 0617   CHOLHDL 3.2 01/30/2014 0617   VLDL 34 01/30/2014 0617   LDLCALC 81 01/30/2014 0617   HgbA1C  Lab Results  Component Value Date   HGBA1C 5.6 11/16/2013    Urine Drug Screen:     Component Value Date/Time   LABOPIA POSITIVE* 11/16/2013 1711   COCAINSCRNUR NONE DETECTED 11/16/2013 1711   LABBENZ NONE DETECTED 11/16/2013 1711   AMPHETMU NONE DETECTED 11/16/2013 1711   THCU POSITIVE* 11/16/2013 1711   LABBARB NONE DETECTED 11/16/2013 1711    Alcohol Level: No results found for this basename: ETH,  in the last 168 hours   CT of the brain  01/29/2014    Left parietal cortical/subcortical infarct versus mass with surrounding edema. The appearance is indeterminate. The edema pattern suggests cytotoxic edema and which would be  consistent with acute infarct, however the morphology is quite rounded which makes an underlying mass is difficult to exclude entirely. No intracranial hemorrhage, significant mass effect or midline shift.  MRI of the brain with and without contrast could further evaluate.    MRI of the brain    MRA of the brain    2D Echocardiogram    Carotid Doppler  No evidence of hemodynamically significant internal carotid artery stenosis. Vertebral artery flow is antegrade.   CXR  01/29/2014    1. No acute cardiopulmonary process seen. 2. Moderate hiatal hernia noted.   EKG  sinus tachycardia. For complete results please see formal report.   Therapy Recommendations   Physical Exam   Pleasant middle aged lady not in distress.Awake alert. Afebrile. Head is nontraumatic. Neck is supple without bruit. Hearing is normal. Cardiac exam no murmur or gallop. Lungs are clear to auscultation. Distal pulses are well felt. Neurological Exam : Awake alert oriented x3 with normal speech and language. No dysarthria or aphasia. Mild right lower facial asymmetry. Tongue midline. Motor system exam reveals no upper or lower extremity drift. Diminished fine finger movements on the right. Orbits left-to-right upper extremity. No lower extremity weakness. Sensation is intact. Gait was not tested. Both plantars are downgoing. ASSESSMENT [name] is a 58 y.o. female presenting with right facial droop and slurred speech.  Imaging pending. Stroke vs mass. On no antithrombotics prior to admission. Now on aspirin for secondary stroke prevention. Stroke work up underway.   Diverticular disease  IBS  Bipolar affective disorder  Hx cocaine, THC and opiate use  LDL [facility] day # 1  TREATMENT/PLAN  Continue aspirin for secondary stroke prevention. Patient interested in considering [facility] research study for secondary stroke prevention. DR. Tilden Dome spoke with patient and family. [facility]  [facility] will follow up.  Check UDS, was positive for opiates and THC in Jan 2015 w/ hx periodic cocaine use  Add MRA  F/u MRI, 2D, HgbA1c  Burnetta Sabin, MSN, RN, ANVP-BC, AGPCNP-BC [facility] Pager: [phone] 01/30/2014 12:03 PM  I have personally obtained a history, examined the patient, evaluated imaging results, and formulated the assessment and plan of care. I agree with the above. Antony Contras, MD   To contact Stroke Continuity provider, please refer to [email or web address] After hours, contact General Neurology

## 2014-01-30 NOTE — Progress Notes (Signed)
Patient c/o severe headache does not have anything for that, MD's office called to notify

## 2014-01-30 NOTE — Progress Notes (Signed)
PT Cancellation Note  Patient Details Name: [name] MRN: [id number] DOB: 03/31/1956   Cancelled Treatment:    Reason Eval/Treat Not Completed: Pain limiting ability to participate--pt reports headache-declined to participate at this time.  Weston Anna, MPT Pager: [phone]

## 2014-01-30 NOTE — Progress Notes (Signed)
VASCULAR LAB PRELIMINARY  PRELIMINARY  PRELIMINARY  PRELIMINARY  Carotid Dopplers completed.    Preliminary report:  1-39% ICA stenosis.  Vertebral artery flow is antegrade.  Jandel Patriarca, RVT 01/30/2014, 11:37 AM

## 2014-01-30 NOTE — Progress Notes (Signed)
[facility] PROGRESS NOTE  [name] MWN:[id number] DOB: 1955/11/17 DOA: 01/29/2014 PCP: Philis Fendt, MD  Assessment/Plan: 1. CVA- On CT scan, CVA noted , Neuro checks, and Monitor on Telemetry, ASA. MRI/MRA re-demonstrates acute CVA. Carotid US unremarkalbe and 2D ECHO with grade 2 diastolic dysfunction. PT/OT/ and Speech consulted. Followed by Neuro 2. Bipolar and Depression- On Effexor rX.  3. Polysubstance Abuse hx- check UDS, pending.  4. Cyclical Vomiting Hx - Anti -Emetics PRN.  5. GERD- Protonix.  6. DVT Prophylaxis with Lovenox.  7. N/V - Pt reports a long hx of cyclic n/v. Reports better response with compazine PRN. Supportive care for now. Encourage PO. As membranes appear dry, will add NS hydration.  Code Status: Full Family Communication: Pt in room (indicate person spoken with, relationship, and if by phone, the number) Disposition Plan: Pending   Consultants:  Neurology  Procedures:   HPI/Subjective: No acute events noted overnight. Complains of n/v.  Objective: Filed Vitals:   01/30/14 0325 01/30/14 0519 01/30/14 0715 01/30/14 1524  BP: 167/84 159/71 168/97 151/86  Pulse: 75 75 70 88  Temp: 97.5 F (36.4 C) 97.9 F (36.6 C) 98.9 F (37.2 C) 98.5 F (36.9 C)  TempSrc: Oral Oral Axillary Oral  Resp: 20 20 20 20   Height:      Weight:      SpO2: 99% 96% 100% 99%   No intake or output data in the 24 hours ending 01/30/14 1725 Filed Weights   01/29/14 2135  Weight: 77.4 kg (170 lb 10.2 oz)    Exam:   General:  Awake, in nad  Cardiovascular: regular, s1, s2  Respiratory: normal resp effort, no wheezing  Abdomen: soft, nondistended  Musculoskeletal: perfused, no clubbing   Data Reviewed: Basic Metabolic Panel:  Recent Labs Lab 01/26/14 1117 01/29/14 1902  NA 138 139  K 4.4 4.1  CL 97 99  CO2 24 22  GLUCOSE 115* 121*  BUN 14 18  CREATININE 0.51 0.71  CALCIUM 9.4 9.8   Liver Function Tests:  Recent Labs Lab  01/26/14 1117 01/29/14 1902  AST 34 17  ALT 15 12  ALKPHOS 134* 120*  BILITOT 0.3 <0.2*  PROT 7.7 7.2  ALBUMIN 4.1 3.9    Recent Labs Lab 01/26/14 1117  LIPASE 17   No results found for this basename: AMMONIA,  in the last 168 hours CBC:  Recent Labs Lab 01/26/14 1117 01/29/14 1902  WBC 11.9* 16.2*  NEUTROABS 9.5* 10.7*  HGB 15.9* 14.9  HCT 43.7 41.9  MCV 92.6 93.5  PLT 358 316   Cardiac Enzymes: No results found for this basename: CKTOTAL, CKMB, CKMBINDEX, TROPONINI,  in the last 168 hours BNP (last 3 results) No results found for this basename: PROBNP,  in the last 8760 hours CBG:  Recent Labs Lab 01/29/14 1855  GLUCAP 120*    Recent Results (from the past 240 hour(s))  URINE CULTURE     Status: None   Collection Time    01/26/14  1:15 PM      Result Value Ref Range Status   Specimen Description URINE, RANDOM   Final   Special Requests NONE   Final   Culture  Setup Time     Final   Value: 01/26/2014 14:19     Performed at [facility]     Final   Value: 45,000 COLONIES/ML     Performed at [facility]  Final   Value: Multiple bacterial morphotypes present, none predominant. Suggest appropriate recollection if clinically indicated.     Performed at [facility]   Report Status 01/27/2014 FINAL   Final     Studies: Dg Chest 2 View  01/29/2014   CLINICAL DATA:  Right-sided weakness; CVA.  EXAM: CHEST  2 VIEW  COMPARISON:  Chest radiograph performed 11/17/2013  FINDINGS: The lungs are well-aerated and clear. There is no evidence of focal opacification, pleural effusion or pneumothorax.  The heart is normal in size; the mediastinal contour is within normal limits. No acute osseous abnormalities are seen. A moderate hiatal hernia is seen, predominantly filled with fluid.  IMPRESSION: 1. No acute cardiopulmonary process seen. 2. Moderate hiatal hernia noted.   Electronically Signed   By: Garald Balding M.D.    On: 01/29/2014 22:53   Ct Head (brain) Wo Contrast  01/29/2014   CLINICAL DATA:  Facial droop, trouble speaking  EXAM: CT HEAD WITHOUT CONTRAST  TECHNIQUE: Contiguous axial images were obtained from the base of the skull through the vertex without intravenous contrast.  COMPARISON:  None.  FINDINGS: Focal hypoattenuation in the left parietal cortex extending to the gray-white interface consistent with cytotoxic edema in the setting of acute/ subacute infarct. However, the abnormality is a somewhat rounded with a punctate focus of higher attenuation centrally. An underlying lesion with surrounding vasogenic edema is not excluded entirely. Acute intracranial hemorrhage, significant mass effect or midline shift. No hydrocephalus. Globes and orbits are intact and unremarkable bilaterally. No acute soft tissue or calvarial abnormality. Bilateral mastoid air cells and paranasal sinuses are unremarkable.  IMPRESSION: Left parietal cortical/subcortical infarct versus mass with surrounding edema. The appearance is indeterminate. The edema pattern suggests cytotoxic edema and which would be consistent with acute infarct, however the morphology is quite rounded which makes an underlying mass is difficult to exclude entirely. No intracranial hemorrhage, significant mass effect or midline shift.  MRI of the brain with and without contrast could further evaluate.  Critical Value/emergent results were called by telephone at the time of interpretation on 01/29/2014 at 7:02 PM to Dr. Nicole Kindred, who verbally acknowledged these results.   Electronically Signed   By: Jacqulynn Cadet M.D.   On: 01/29/2014 19:03   Mr Jodene Nam Head Wo Contrast  01/30/2014   CLINICAL DATA:  Stroke. Two days of speech difficulty, left-sided facial droop, and right-sided weakness.  EXAM: MRI HEAD WITHOUT CONTRAST  MRA HEAD WITHOUT CONTRAST  TECHNIQUE: Multiplanar, multiecho pulse sequences of the brain and surrounding structures were obtained without  intravenous contrast. Angiographic images of the head were obtained using MRA technique without contrast.  COMPARISON:  Head CT 01/29/2014  FINDINGS: MRI HEAD FINDINGS  There is an approximately 3 cm acute cortical and subcortical infarct in the left parietal lobe corresponding to the hypodensity on recent CT. Additional, more punctate foci of acute infarction are present in the central and posterior left centrum semiovale and anterior left parietal lobe near the operculum. There is no evidence of intracranial hemorrhage, mass, midline shaft, or extra-axial fluid collection. Scattered, small foci of T2 hyperintensity are present in the subcortical and deep cerebral white matter bilaterally, nonspecific but compatible with mild chronic small vessel ischemic disease. Ventricles and sulci are within normal limits for age.  Orbits are unremarkable. Right maxillary sinus is small. Paranasal sinuses are clear, as are the mastoid air cells. Major intracranial vascular flow voids are preserved.  MRA HEAD FINDINGS  Images are mildly degraded by motion.  Visualized distal vertebral arteries are patent with the right being slightly dominant. PICA origins are patent bilaterally. AICAs are not clearly identified. SCA origins are patent. Basilar artery is tortuous but patent without stenosis. PCAs are unremarkable. There is a small, patent left posterior communicating artery.  Internal carotid arteries are patent from skullbase to carotid termini. There is a severe focal stenosis versus segmental occlusion measuring 4 mm in length involving the proximal left M1 segment. The distal left M1 segment and proximal M2 branches are patent, although there is asymmetrically diminished flow within more distal left MCA branches compared to the right. Right MCA and bilateral ACA are patent with mild branch vessel irregularity.  IMPRESSION: 1. 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 2. Mildly motion  degraded MRA. Severe stenosis versus focal occlusion of the proximal left M1 segment with preserved flow more distally in the left M1 and proximal M2 segments. There is asymmetrically diminished flow in more distal left MCA branches. These results will be called to the ordering clinician or representative by the Radiologist Assistant, and communication documented in the PACS Dashboard.   Electronically Signed   By: Logan Bores   On: 01/30/2014 14:30   Mr Brain Wo Contrast  01/30/2014   CLINICAL DATA:  Stroke. Two days of speech difficulty, left-sided facial droop, and right-sided weakness.  EXAM: MRI HEAD WITHOUT CONTRAST  MRA HEAD WITHOUT CONTRAST  TECHNIQUE: Multiplanar, multiecho pulse sequences of the brain and surrounding structures were obtained without intravenous contrast. Angiographic images of the head were obtained using MRA technique without contrast.  COMPARISON:  Head CT 01/29/2014  FINDINGS: MRI HEAD FINDINGS  There is an approximately 3 cm acute cortical and subcortical infarct in the left parietal lobe corresponding to the hypodensity on recent CT. Additional, more punctate foci of acute infarction are present in the central and posterior left centrum semiovale and anterior left parietal lobe near the operculum. There is no evidence of intracranial hemorrhage, mass, midline shaft, or extra-axial fluid collection. Scattered, small foci of T2 hyperintensity are present in the subcortical and deep cerebral white matter bilaterally, nonspecific but compatible with mild chronic small vessel ischemic disease. Ventricles and sulci are within normal limits for age.  Orbits are unremarkable. Right maxillary sinus is small. Paranasal sinuses are clear, as are the mastoid air cells. Major intracranial vascular flow voids are preserved.  MRA HEAD FINDINGS  Images are mildly degraded by motion.  Visualized distal vertebral arteries are patent with the right being slightly dominant. PICA origins are patent  bilaterally. AICAs are not clearly identified. SCA origins are patent. Basilar artery is tortuous but patent without stenosis. PCAs are unremarkable. There is a small, patent left posterior communicating artery.  Internal carotid arteries are patent from skullbase to carotid termini. There is a severe focal stenosis versus segmental occlusion measuring 4 mm in length involving the proximal left M1 segment. The distal left M1 segment and proximal M2 branches are patent, although there is asymmetrically diminished flow within more distal left MCA branches compared to the right. Right MCA and bilateral ACA are patent with mild branch vessel irregularity.  IMPRESSION: 1. 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 2. Mildly motion degraded MRA. Severe stenosis versus focal occlusion of the proximal left M1 segment with preserved flow more distally in the left M1 and proximal M2 segments. There is asymmetrically diminished flow in more distal left MCA branches. These results will be called to the ordering clinician or  representative by the Radiologist Assistant, and communication documented in the PACS Dashboard.   Electronically Signed   By: Logan Bores   On: 01/30/2014 14:30    Scheduled Meds: . enoxaparin (LOVENOX) injection  40 mg Subcutaneous Q24H  . fluticasone  2 spray Each Nare Daily  . lamoTRIgine  150 mg Oral Daily  . multivitamin with minerals  1 tablet Oral Daily  . perphenazine  4 mg Oral TID  . [START ON 01/31/2014] research study medication  100 mg Oral Daily  . research study medication  90 mg Oral BID  . pramipexole  0.125 mg Oral QHS  . venlafaxine XR  75 mg Oral Daily   Continuous Infusions:   Principal Problem:   CVA (cerebral infarction) Active Problems:   Bipolar affective disorder   GERD (gastroesophageal reflux disease)   Cyclical vomiting   Polysubstance abuse  Time spent: 71min  [facility], [facility] Pager [phone]. If  7PM-7AM, please contact night-coverage at [email or web address], password [facility] 01/30/2014, 5:25 PM  LOS: 1 day

## 2014-01-30 NOTE — Evaluation (Signed)
Occupational Therapy Evaluation Patient Details Name: [name] MRN: [id number] DOB: 1956-01-05 Today's Date: 01/30/2014    History of Present Illness pt was admited with R facial droop and right sided weakness.  MRI sowed acute L parietal lobe infarct. She has a PMH significant for bipolar   Clinical Impression   Pt was seen for initial evaluation.  Evaluation was limited by headache.  Will return one more time to further assess high level balance during adls.  Will also walk to gym to assess tub transfer as simulated transfer was not effective in assessing safety with this.  At baseline, pt is independent with all adls/iadls.    Follow Up Recommendations  Supervision - Intermittent (initial supervision when pt up moving, adls/iadls)  Pt reports husband's schedule is flexible.     Equipment Recommendations  None recommended by OT    Recommendations for Other Services       Precautions / Restrictions Precautions Precautions: Fall Restrictions Weight Bearing Restrictions: No      Mobility Bed Mobility Overal bed mobility: Independent                Transfers Overall transfer level: Independent Equipment used: None             General transfer comment: pt tends to move quickly    Balance                                            ADL Overall ADL's : Needs assistance/impaired (set up for adls)                                 Tub/ Shower Transfer: Tub transfer;Min guard (simulated)   Functional mobility during ADLs: Modified independent (pt moves quickly but no LOB) General ADL Comments: set up for ADLs.  Pt donned socks without difficulty by crossing legs.  She does not report any difficulty with coordination.  Pt back from MRI with a headache.  Simulated stepping over tub (twice) but pt had difficulty simulating each leg stepping over--would step over with one then take a normal step with second.  She practiced with  each leg but did not do both together.  Will plan to see her one more time for higher level balance during adl and tub transfer.  Discussed non-skid on bottom of tub, clamp on grab bar, if needed (reapplying just before each use), using long sponge for feet, and having husband nearby if she does not get a seat.       Vision                     Perception     Praxis Praxis Praxis tested?: Within functional limits Praxis-Other Comments: ideomotor/ideational during eval    Pertinent Vitals/Pain Headache from MRI, not rated.  RN aware     Hand Dominance Right   Extremity/Trunk Assessment Upper Extremity Assessment Upper Extremity Assessment: Overall WFL for tasks assessed (reports normal sensation now; initially had R numbness)           Communication Communication Communication: No difficulties (pt states she still has some difficulty expressing herself)   Cognition Arousal/Alertness: Awake/alert Behavior During Therapy: WFL for tasks assessed/performed Overall Cognitive Status: Within Functional Limits for tasks assessed  General Comments       Exercises       Shoulder Instructions      Home Living Family/patient expects to be discharged to:: Private residence Living Arrangements: Spouse/significant other   Type of Home: House Home Access: Stairs to enter Technical brewer of Steps: 2-3         Bathroom Shower/Tub: Tub/shower unit Shower/tub characteristics: Architectural technologist: Standard            Lives With: Spouse (works days but flexible schedule)    Prior Functioning/Environment Level of Independence: Independent             OT Diagnosis: Generalized weakness   OT Problem List: Decreased safety awareness;Pain;Decreased activity tolerance   OT Treatment/Interventions: Self-care/ADL training;Patient/family education;DME and/or AE instruction    OT Goals(Current goals can be found in the care plan  section) Acute Rehab OT Goals Patient Stated Goal: get rid of this headache OT Goal Formulation: With patient Time For Goal Achievement: 02/06/14 Potential to Achieve Goals: Good ADL Goals Pt Will Transfer to Toilet: with modified independence;regular height toilet;ambulating Pt Will Perform Tub/Shower Transfer: Tub transfer;with supervision;ambulating (with/without grab bar) Additional ADL Goal #1: Pt will gather adl supplies at mod I level  OT Frequency: Min 2X/week   Barriers to D/C:            Co-evaluation              End of Session    Activity Tolerance: Patient limited by pain (headache from MRI, RN awared) Patient left: in bed;with call bell/phone within reach;with bed alarm set   Time: 1422-1435 OT Time Calculation (min): 13 min Charges:  OT General Charges $OT Visit: 1 Procedure OT Evaluation $Initial OT Evaluation Tier I: 1 Procedure   G-Codes:    [name] 2014-01-31, 3:03 PM [name], OTR/L [id number] January 31, 2014

## 2014-01-30 NOTE — Progress Notes (Signed)
  Echocardiogram 2D Echocardiogram has been performed.  Mauricio Po 01/30/2014, 11:29 AM

## 2014-01-30 NOTE — Progress Notes (Signed)
Text paged [name] at [phone] regarding pt co of continued nausea. Pt states she takes phenergan at home and it works well for her. 50 K. Baltazar Najjar put in an order for 25 mg of IV phenergan. Phenergan administered. IV patent. Diluted in 10 ml of NS and pushed over 3 minutes. Pt denies burning or pain at site.   0520 pt is now resting

## 2014-01-30 NOTE — Evaluation (Addendum)
Speech Language Pathology Evaluation Patient Details Name: [name] MRN: [id number] DOB: [birth year] Today's Date: 01/30/2014 Time: 2951-8841 SLP Time Calculation (min): 27 min  Problem List:  Patient Active Problem List   Diagnosis Date Noted  . CVA (cerebral infarction) 01/29/2014  . Polysubstance abuse 01/29/2014  . Intractable nausea and vomiting 11/16/2013  . Cyclical vomiting 66/04/3015  . N&V (nausea and vomiting) 02/03/2013  . Bipolar affective disorder   . GERD (gastroesophageal reflux disease)   . 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 HEMATEMESIS 11/15/2006  . ENDOMETRIOSIS 11/15/2006  . DEPRESSION 09/05/2006  . MIGRAINE HEADACHE 09/05/2006  . IBS 09/05/2006  . CERVICALGIA 09/05/2006  . HYSTERECTOMY, HX OF 09/05/2006   Past Medical History:  Past Medical History  Diagnosis Date  . Hiatal hernia   . IBS (irritable bowel syndrome)   . Diverticulitis   . Bipolar affective disorder   . GERD (gastroesophageal reflux disease)    Past Surgical History:  Past Surgical History  Procedure Laterality Date  . Abdominal hysterectomy    . Breast lumpectomy     HPI:  58 yo female adm to [facility] with right sided weakness, right facial droop - found to have left parietal cortical/sub-cortical cva vs mass.  PMH + for GERD, IBS, HH, bipolar d/o.  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  Speech evaluation ordered.     Assessment / Plan / Recommendation Clinical Impression  Pt presents with mild cognitive linguistic deficits with decreased attention (suspect nausea contributing) and intermittent inappropriate response to questions.  She did not recall results of CT head that MD shared with her-spouse reports she was much worse at time of discussion.  [name] did recall language deficits prior to admit and reports ongoing improvement.  She answers questions in short phrase/sentence responses without expounding.   Pt was  able to verbalize need to use call bell for assistance due to fall risk.  In addition pt able to follow simple directions and named 12 animals in 60 seconds with 2 verbal catergory cues.  Pt denies facial sensory deficits - ? mildly decreased right facial movement.  Speech is not dysarthric - although vocal intensity is decreased-suspect due to nausea.     As pt is responsible for financial management of her home, SLP will follow up to assure she is at/near baseline functioning level and to educate family to compensation strategies.  Pt uses reading glasses but they are at home.    Spouse and pt agreeable to further SLP tx and agreeable to specific goals.      SLP Assessment  Patient needs continued Speech Lanaguage Pathology Services    Follow Up Recommendations    further slp at next venue of care   Frequency and Duration min 1 x/week  2 weeks   Pertinent Vitals/Pain Afebrile,decreased   SLP Goals  SLP Goals Potential to Achieve Goals: Fair Potential Considerations: Other (comment) (? medical diagnosis)  SLP Evaluation Prior Functioning  Cognitive/Linguistic Baseline: Within functional limits  Lives With: Spouse (spouse of 14 years) Available Help at Discharge:  (spouse works flexible schedule, states he can check on pt during the day) Vocation: On disability   Cognition  Overall Cognitive Status: Within Functional Limits for tasks assessed Orientation Level: Oriented X4 Attention: Sustained;Selective Sustained Attention: Appears intact Selective Attention: Impaired Selective Attention Impairment: Verbal basic (suspect internal distraction of nausea contributing to decreased attention) Memory: Impaired (did not recall md  giving her results of CT head) Awareness: Appears intact Problem Solving: Appears intact (verbalized inability to get up independently due to fall risk) Safety/Judgment: Appears intact    Comprehension  Auditory Comprehension Overall Auditory  Comprehension: Appears within functional limits for tasks assessed Yes/No Questions: Not tested Commands: Within Functional Limits Conversation: Complex Other Conversation Comments: pt at times off topic when answering questions - asked for results of testing and pt described symptoms Visual Recognition/Discrimination Discrimination: Not tested Reading Comprehension Reading Status: Not tested (pt has reading glasses-not present)    Expression Expression Primary Mode of Expression: Verbal Verbal Expression Overall Verbal Expression: Appears within functional limits for tasks assessed Initiation: No impairment Level of Generative/Spontaneous Verbalization: Sentence Repetition: No impairment Naming: Not tested Pragmatics: No impairment Interfering Components: Attention Other Verbal Expression Comments: naming animals - 12 in 60 seconds - required verbal cue x2 Written Expression Dominant Hand: Right Written Expression: Not tested   Oral / Motor Oral Motor/Sensory Function Overall Oral Motor/Sensory Function: Appears within functional limits for tasks assessed (? minimal decreased movement on right - otherwise no focal cn deficits) Motor Speech Overall Motor Speech: Appears within functional limits for tasks assessed Respiration: Within functional limits Phonation: Low vocal intensity Articulation: Within functional limitis Intelligibility: Intelligible Motor Planning: Witnin functional limits Motor Speech Errors: Not applicable   GO     Claudie Fisherman, [facility] SLP [phone]

## 2014-01-31 DIAGNOSIS — Z9079 Acquired absence of other genital organ(s): Secondary | ICD-10-CM

## 2014-01-31 MED ORDER — STUDY - INVESTIGATIONAL DRUG SIMPLE RECORD: 100.0000 mg | Freq: Every day | Status: DC

## 2014-01-31 MED ORDER — STUDY - INVESTIGATIONAL DRUG SIMPLE RECORD: 90.0000 mg | Freq: Two times a day (BID) | Status: DC

## 2014-01-31 NOTE — Progress Notes (Signed)
Patient education and paperwork completed.  Study medication given.  IV removed and patient to be driven home by her husband.

## 2014-01-31 NOTE — Discharge Summary (Signed)
Physician Discharge Summary  [name] QMG:[id number] DOB: Nov 06, 1955 DOA: 01/29/2014  PCP: Philis Fendt, MD  Admit date: 01/29/2014 Discharge date: 01/31/2014  Time spent: 35 minutes  Recommendations for Outpatient Follow-up:  1. Follow up with PCP in 1-2 weeks 2. Follow up with Dr. Leonie Man in 2 mos 3. Follow up for outpatient TEE 4. Pt advised to stop illicit substances  Discharge Diagnoses:  Principal Problem:   CVA (cerebral infarction) Active Problems:   Bipolar affective disorder   GERD (gastroesophageal reflux disease)   Cyclical vomiting   Polysubstance abuse   Discharge Condition: Stable  Diet recommendation: Heart healthy  Filed Weights   01/29/14 2135  Weight: 77.4 kg (170 lb 10.2 oz)    History of present illness:  [name] is a 58 y.o. female with a history of Bipolar disorder , IBS, and GERD who presents to the ED with complaints of 2 days of difficulty with her speech and Left sided Facial droop with right sided weakness. She also had trouble walking due to weakness of her right side. Since she is having difficulty with her speech, her husband is assisting her with the history . The symptoms began yesterday lasted for up to 15 mijutes then resolved but recurred again today at 4 pm. She reports that the symptoms returned and were not resolving. She also had headaches and dizziness and confusion 2 week ago and was unable to talk. The symptoms resolved at that time.  In the Ed, she was evaluated and was found to have a Left Parietal Lobe Acute to Subacute Infarct On CT scan. When she arrived to the Ed a Code Stroke was called, and she was evaluated by Neuro Dr. Nicole Kindred, and her NIH score was 3, and she was not deemed to be appropriate for TPA administration. She was referred for further workup and risk stratification.   Hospital Course:  1. CVA- On CT scan, CVA noted , Neuro checks, and Monitor on Telemetry, ASA. MRI/MRA re-demonstrates acute CVA. Carotid  US unremarkalbe and 2D ECHO with grade 2 diastolic dysfunction. PT/OT/ and Speech consulted with no needs recommended. Pt agrees to participate in SOCRATES trial. Pt to undergo outpatient TEE. 2. Bipolar and Depression- On Effexor rX.  3. Polysubstance Abuse hx- pos for marijuana and barbituates  4. Cyclical Vomiting Hx - Anti -Emetics PRN. Improved - suspect related to illilcit substance abuse 5. GERD- Protonix.  6. DVT Prophylaxis with Lovenox.  7. N/V - Pt reports a long hx of cyclic n/v. Reports better response with compazine PRN. Supportive care for now. Encourage PO. As membranes appear dry, had added NS hydration overnight  Consultations:  Neurology  Discharge Exam: Filed Vitals:   01/30/14 2109 01/31/14 0141 01/31/14 0439 01/31/14 1017  BP: 146/85 114/74 122/72 125/77  Pulse: 95 87 82 82  Temp: 99 F (37.2 C) 98.7 F (37.1 C) 98.8 F (37.1 C) 98.7 F (37.1 C)  TempSrc: Oral Oral Oral Oral  Resp: 18 18 18 18   Height:      Weight:      SpO2: 95% 95% 96% 96%    General: Awake, in nad Cardiovascular: regular, s1, s2 Respiratory: normal resp effort, no wheezing  Discharge Instructions       Future Appointments Provider Department Dept Phone   02/06/2014 2:30 PM [facility] [place] [facility] [place]  IMAGING [phone]   Please wear two piece clothing and wear no powder or deodorant. Please arrive 15 minutes early prior to your  appointment time.   02/06/2014 3:00 PM [facility] [place] [facility] [place]  IMAGING [phone]   Please wear two piece clothing ans wear no powder or deordor Please arrive 15 minutes prior to your appointment time   02/06/2014 3:10 PM [facility] [place] [facility] [place] [phone]   Please wear two piece clothing ans wear no powder or deordor Please arrive 15 minutes prior to your appointment time       Medication List         cyclobenzaprine 10 MG tablet  Commonly known as:  FLEXERIL  Take 10 mg by mouth 3  (three) times daily as needed for muscle spasms. For muscle spasms in back     diclofenac sodium 1 % Gel  Commonly known as:  VOLTAREN  Apply 1 g topically daily. To back and elbow     dicyclomine 20 MG tablet  Commonly known as:  BENTYL  Take 20 mg by mouth 3 (three) times daily as needed. For intestinal spasms     diphenhydrAMINE 25 MG tablet  Commonly known as:  SOMINEX  Take 25 mg by mouth 3 (three) times daily as needed for allergies or sleep.     fluticasone 50 MCG/ACT nasal spray  Commonly known as:  FLONASE  Place 2 sprays into the nose daily.     lamoTRIgine 150 MG tablet  Commonly known as:  LAMICTAL  Take 150 mg by mouth daily.     multivitamin with minerals Tabs tablet  Take 1 tablet by mouth daily.     perphenazine 4 MG tablet  Commonly known as:  TRILAFON  Take 4 mg by mouth 3 (three) times daily.     pramipexole 0.125 MG tablet  Commonly known as:  MIRAPEX  Take 0.125 mg by mouth at bedtime.     promethazine 25 MG tablet  Commonly known as:  PHENERGAN  Take 25 mg by mouth every 6 (six) hours as needed. For nausea/vomiting     promethazine 25 MG suppository  Commonly known as:  PHENERGAN  Place 1 suppository (25 mg total) rectally every 6 (six) hours as needed for nausea or vomiting.     research study medication  Take 100 mg by mouth daily.     research study medication  Take 90 mg by mouth 2 (two) times daily.     venlafaxine XR 75 MG 24 hr capsule  Commonly known as:  EFFEXOR-XR  Take 75 mg by mouth daily.       No Known Allergies Follow-up Information   Follow up with AVBUERE,EDWIN A, MD. Schedule an appointment as soon as possible for a visit in 1 week.   Specialty:  Internal Medicine   Contact information:   [address] [place] [address] [phone]       Follow up with Forbes Cellar, MD. Schedule an appointment as soon as possible for a visit in 2 months.   Specialties:  Neurology, Radiology   Contact information:    [address] [place] [address] [phone]       Follow up with you will be contacted to set up an outpatient Trans-Esophageal Echocardiogram.       The results of significant diagnostics from this hospitalization (including imaging, microbiology, ancillary and laboratory) are listed below for reference.    Significant Diagnostic Studies: Dg Chest 2 View  01/29/2014   CLINICAL DATA:  Right-sided weakness; CVA.  EXAM: CHEST  2 VIEW  COMPARISON:  Chest radiograph performed 11/17/2013  FINDINGS: The lungs are well-aerated and clear. There is no evidence of focal opacification, pleural effusion or pneumothorax.  The heart is normal in size; the mediastinal contour is within normal limits. No acute osseous abnormalities are seen. A moderate hiatal hernia is seen, predominantly filled with fluid.  IMPRESSION: 1. No acute cardiopulmonary process seen. 2. Moderate hiatal hernia noted.   Electronically Signed   By: Garald Balding M.D.   On: 01/29/2014 22:53   Ct Head (brain) Wo Contrast  01/29/2014   CLINICAL DATA:  Facial droop, trouble speaking  EXAM: CT HEAD WITHOUT CONTRAST  TECHNIQUE: Contiguous axial images were obtained from the base of the skull through the vertex without intravenous contrast.  COMPARISON:  None.  FINDINGS: Focal hypoattenuation in the left parietal cortex extending to the gray-white interface consistent with cytotoxic edema in the setting of acute/ subacute infarct. However, the abnormality is a somewhat rounded with a punctate focus of higher attenuation centrally. An underlying lesion with surrounding vasogenic edema is not excluded entirely. Acute intracranial hemorrhage, significant mass effect or midline shift. No hydrocephalus. Globes and orbits are intact and unremarkable bilaterally. No acute soft tissue or calvarial abnormality. Bilateral mastoid air cells and paranasal sinuses are unremarkable.  IMPRESSION: Left parietal cortical/subcortical infarct  versus mass with surrounding edema. The appearance is indeterminate. The edema pattern suggests cytotoxic edema and which would be consistent with acute infarct, however the morphology is quite rounded which makes an underlying mass is difficult to exclude entirely. No intracranial hemorrhage, significant mass effect or midline shift.  MRI of the brain with and without contrast could further evaluate.  Critical Value/emergent results were called by telephone at the time of interpretation on 01/29/2014 at 7:02 PM to Dr. Nicole Kindred, who verbally acknowledged these results.   Electronically Signed   By: Jacqulynn Cadet M.D.   On: 01/29/2014 19:03   Mr Jodene Nam Head Wo Contrast  01/30/2014   CLINICAL DATA:  Stroke. Two days of speech difficulty, left-sided facial droop, and right-sided weakness.  EXAM: MRI HEAD WITHOUT CONTRAST  MRA HEAD WITHOUT CONTRAST  TECHNIQUE: Multiplanar, multiecho pulse sequences of the brain and surrounding structures were obtained without intravenous contrast. Angiographic images of the head were obtained using MRA technique without contrast.  COMPARISON:  Head CT 01/29/2014  FINDINGS: MRI HEAD FINDINGS  There is an approximately 3 cm acute cortical and subcortical infarct in the left parietal lobe corresponding to the hypodensity on recent CT. Additional, more punctate foci of acute infarction are present in the central and posterior left centrum semiovale and anterior left parietal lobe near the operculum. There is no evidence of intracranial hemorrhage, mass, midline shaft, or extra-axial fluid collection. Scattered, small foci of T2 hyperintensity are present in the subcortical and deep cerebral white matter bilaterally, nonspecific but compatible with mild chronic small vessel ischemic disease. Ventricles and sulci are within normal limits for age.  Orbits are unremarkable. Right maxillary sinus is small. Paranasal sinuses are clear, as are the mastoid air cells. Major intracranial vascular  flow voids are preserved.  MRA HEAD FINDINGS  Images are mildly degraded by motion.  Visualized distal vertebral arteries are patent with the right being slightly dominant. PICA origins are patent bilaterally. AICAs are not clearly identified. SCA origins are patent. Basilar artery is tortuous but patent without stenosis. PCAs are unremarkable. There is a small, patent left posterior communicating artery.  Internal carotid arteries are patent from skullbase to carotid termini. There is a severe  focal stenosis versus segmental occlusion measuring 4 mm in length involving the proximal left M1 segment. The distal left M1 segment and proximal M2 branches are patent, although there is asymmetrically diminished flow within more distal left MCA branches compared to the right. Right MCA and bilateral ACA are patent with mild branch vessel irregularity.  IMPRESSION: 1. 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 2. Mildly motion degraded MRA. Severe stenosis versus focal occlusion of the proximal left M1 segment with preserved flow more distally in the left M1 and proximal M2 segments. There is asymmetrically diminished flow in more distal left MCA branches. These results will be called to the ordering clinician or representative by the Radiologist Assistant, and communication documented in the PACS Dashboard.   Electronically Signed   By: Logan Bores   On: 01/30/2014 14:30   Mr Brain Wo Contrast  01/30/2014   CLINICAL DATA:  Stroke. Two days of speech difficulty, left-sided facial droop, and right-sided weakness.  EXAM: MRI HEAD WITHOUT CONTRAST  MRA HEAD WITHOUT CONTRAST  TECHNIQUE: Multiplanar, multiecho pulse sequences of the brain and surrounding structures were obtained without intravenous contrast. Angiographic images of the head were obtained using MRA technique without contrast.  COMPARISON:  Head CT 01/29/2014  FINDINGS: MRI HEAD FINDINGS  There is an approximately 3 cm acute  cortical and subcortical infarct in the left parietal lobe corresponding to the hypodensity on recent CT. Additional, more punctate foci of acute infarction are present in the central and posterior left centrum semiovale and anterior left parietal lobe near the operculum. There is no evidence of intracranial hemorrhage, mass, midline shaft, or extra-axial fluid collection. Scattered, small foci of T2 hyperintensity are present in the subcortical and deep cerebral white matter bilaterally, nonspecific but compatible with mild chronic small vessel ischemic disease. Ventricles and sulci are within normal limits for age.  Orbits are unremarkable. Right maxillary sinus is small. Paranasal sinuses are clear, as are the mastoid air cells. Major intracranial vascular flow voids are preserved.  MRA HEAD FINDINGS  Images are mildly degraded by motion.  Visualized distal vertebral arteries are patent with the right being slightly dominant. PICA origins are patent bilaterally. AICAs are not clearly identified. SCA origins are patent. Basilar artery is tortuous but patent without stenosis. PCAs are unremarkable. There is a small, patent left posterior communicating artery.  Internal carotid arteries are patent from skullbase to carotid termini. There is a severe focal stenosis versus segmental occlusion measuring 4 mm in length involving the proximal left M1 segment. The distal left M1 segment and proximal M2 branches are patent, although there is asymmetrically diminished flow within more distal left MCA branches compared to the right. Right MCA and bilateral ACA are patent with mild branch vessel irregularity.  IMPRESSION: 1. 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 2. Mildly motion degraded MRA. Severe stenosis versus focal occlusion of the proximal left M1 segment with preserved flow more distally in the left M1 and proximal M2 segments. There is asymmetrically diminished flow in more  distal left MCA branches. These results will be called to the ordering clinician or representative by the Radiologist Assistant, and communication documented in the PACS Dashboard.   Electronically Signed   By: Logan Bores   On: 01/30/2014 14:30    Microbiology: Recent Results (from the past 240 hour(s))  URINE CULTURE     Status: None   Collection Time    01/26/14  1:15 PM  Result Value Ref Range Status   Specimen Description URINE, RANDOM   Final   Special Requests NONE   Final   Culture  Setup Time     Final   Value: 01/26/2014 14:19     Performed at [facility]     Final   Value: 45,000 COLONIES/ML     Performed at [facility]   Culture     Final   Value: Multiple bacterial morphotypes present, none predominant. Suggest appropriate recollection if clinically indicated.     Performed at [facility]   Report Status 01/27/2014 FINAL   Final     Labs: Basic Metabolic Panel:  Recent Labs Lab 01/26/14 1117 01/29/14 1902  NA 138 139  K 4.4 4.1  CL 97 99  CO2 24 22  GLUCOSE 115* 121*  BUN 14 18  CREATININE 0.51 0.71  CALCIUM 9.4 9.8   Liver Function Tests:  Recent Labs Lab 01/26/14 1117 01/29/14 1902  AST 34 17  ALT 15 12  ALKPHOS 134* 120*  BILITOT 0.3 <0.2*  PROT 7.7 7.2  ALBUMIN 4.1 3.9    Recent Labs Lab 01/26/14 1117  LIPASE 17   No results found for this basename: AMMONIA,  in the last 168 hours CBC:  Recent Labs Lab 01/26/14 1117 01/29/14 1902  WBC 11.9* 16.2*  NEUTROABS 9.5* 10.7*  HGB 15.9* 14.9  HCT 43.7 41.9  MCV 92.6 93.5  PLT 358 316   Cardiac Enzymes: No results found for this basename: CKTOTAL, CKMB, CKMBINDEX, TROPONINI,  in the last 168 hours BNP: BNP (last 3 results) No results found for this basename: PROBNP,  in the last 8760 hours CBG:  Recent Labs Lab 01/29/14 1855  GLUCAP 120*   Signed:  CHIU, STEPHEN K  [facility] 01/31/2014, 1:37 PM

## 2014-01-31 NOTE — Progress Notes (Signed)
Stroke Team Progress Note  HISTORY [name] is an 58 y.o. female with a history of irritable bowel syndrome, diverticulitis and bipolar affective disorder, who was brought to the emergency room following an episode of acute onset of right facial droop as well as slurred speech. Symptoms cleared in route but recurred after arriving in the emergency room. Patient also gives a history of an episode 2 days ago of transient word finding difficulty. She has no previous history of stroke nor TIA. She has not been on antiplatelet therapy. 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 Mass lesion could not be ruled out although cytotoxic edema is more likely. NIH stroke score was 4. Patient was not deemed a candidate for TPA secondary to stroke within past 90 days. She was admitted  for further evaluation and treatment.  SUBJECTIVE There are no family members present. The patient feels that her speech is much improved. Dr. Leonie Man discussed the patient's positive urine drug screen with the patient.  OBJECTIVE Most recent Vital Signs: Filed Vitals:   01/30/14 1809 01/30/14 2109 01/31/14 0141 01/31/14 0439  BP: 145/77 146/85 114/74 122/72  Pulse: 102 95 87 82  Temp: 97.9 F (36.6 C) 99 F (37.2 C) 98.7 F (37.1 C) 98.8 F (37.1 C)  TempSrc: Axillary Oral Oral Oral  Resp: 20 18 18 18   Height:      Weight:      SpO2: 99% 95% 95% 96%   CBG (last 3)   Recent Labs  01/29/14 1855  GLUCAP 120*    IV Fluid Intake:     MEDICATIONS  . enoxaparin (LOVENOX) injection  40 mg Subcutaneous Q24H  . fluticasone  2 spray Each Nare Daily  . lamoTRIgine  150 mg Oral Daily  . multivitamin with minerals  1 tablet Oral Daily  . perphenazine  4 mg Oral TID  . research study medication  100 mg Oral Daily  . research study medication  90 mg Oral BID  . pramipexole  0.125 mg Oral QHS  . venlafaxine XR  75 mg Oral Daily   PRN:  acetaminophen, dicyclomine,  ondansetron (ZOFRAN) IV, prochlorperazine, senna-docusate  Diet:  Cardiac thin liquids Activity:  Up with assistance DVT Prophylaxis:  Lovenox 40 mg sq daily   CLINICALLY SIGNIFICANT STUDIES Basic Metabolic Panel:   Recent Labs Lab 01/26/14 1117 01/29/14 1902  NA 138 139  K 4.4 4.1  CL 97 99  CO2 24 22  GLUCOSE 115* 121*  BUN 14 18  CREATININE 0.51 0.71  CALCIUM 9.4 9.8   Liver Function Tests:   Recent Labs Lab 01/26/14 1117 01/29/14 1902  AST 34 17  ALT 15 12  ALKPHOS 134* 120*  BILITOT 0.3 <0.2*  PROT 7.7 7.2  ALBUMIN 4.1 3.9   CBC:   Recent Labs Lab 01/26/14 1117 01/29/14 1902  WBC 11.9* 16.2*  NEUTROABS 9.5* 10.7*  HGB 15.9* 14.9  HCT 43.7 41.9  MCV 92.6 93.5  PLT 358 316   Coagulation:   Recent Labs Lab 01/29/14 1902  LABPROT 13.0  INR 1.00   Cardiac Enzymes: No results found for this basename: CKTOTAL, CKMB, CKMBINDEX, TROPONINI,  in the last 168 hours Urinalysis:   Recent Labs Lab 01/26/14 1315  COLORURINE YELLOW  LABSPEC 1.021  PHURINE 7.0  GLUCOSEU NEGATIVE  HGBUR SMALL*  BILIRUBINUR NEGATIVE  KETONESUR NEGATIVE  PROTEINUR NEGATIVE  UROBILINOGEN 0.2  NITRITE NEGATIVE  LEUKOCYTESUR SMALL*   Lipid Panel  Component Value Date/Time   CHOL 168 01/30/2014 0617   TRIG 168* 01/30/2014 0617   HDL 53 01/30/2014 0617   CHOLHDL 3.2 01/30/2014 0617   VLDL 34 01/30/2014 0617   LDLCALC 81 01/30/2014 0617   HgbA1C  Lab Results  Component Value Date   HGBA1C 5.3 01/30/2014    Urine Drug Screen:     Component Value Date/Time   LABOPIA NONE DETECTED 01/30/2014 1415   COCAINSCRNUR NONE DETECTED 01/30/2014 1415   LABBENZ NONE DETECTED 01/30/2014 1415   AMPHETMU NONE DETECTED 01/30/2014 1415   THCU POSITIVE* 01/30/2014 1415   LABBARB POSITIVE* 01/30/2014 1415    Alcohol Level: No results found for this basename: ETH,  in the last 168 hours   CT of the brain  01/29/2014    Left parietal cortical/subcortical infarct versus mass with surrounding edema. The  appearance is indeterminate. The edema pattern suggests cytotoxic edema and which would be consistent with acute infarct, however the morphology is quite rounded which makes an underlying mass is difficult to exclude entirely. No intracranial hemorrhage, significant mass effect or midline shift.  MRI of the brain with and without contrast could further evaluate.    MRI / MRA of the brain   01/30/2014 1. 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 2. Mildly motion degraded MRA. Severe stenosis versus focal occlusion of the proximal left M1 segment with preserved flow more distally in the left M1 and proximal M2 segments. There is asymmetrically diminished flow in more distal left MCA branches.  2D Echocardiogram  ejection fraction 65-70%. No cardiac source of emboli identified.  Carotid Doppler  No evidence of hemodynamically significant internal carotid artery stenosis. Vertebral artery flow is antegrade.   CXR  01/29/2014    1. No acute cardiopulmonary process seen. 2. Moderate hiatal hernia noted.   EKG  sinus tachycardia. For complete results please see formal report.   Therapy Recommendations no followup therapy is recommended.  Physical Exam   Pleasant middle aged lady not in distress.Awake alert. Afebrile. Head is nontraumatic. Neck is supple without bruit. Hearing is normal. Cardiac exam no murmur or gallop. Lungs are clear to auscultation. Distal pulses are well felt.  Neurological Exam : Awake alert oriented x3 with normal speech and language. No dysarthria or aphasia. Mild right lower facial asymmetry. Tongue midline. Motor system exam reveals no upper or lower extremity drift. Diminished fine finger movements on the right. Orbits left-to-right upper extremity. No lower extremity weakness. Sensation is intact. Gait was not tested. Both plantars are downgoing.  ASSESSMENT [name] is a 58 y.o. female presenting with right facial droop and  slurred speech.  Imaging pending. Stroke vs mass. On no antithrombotics prior to admission. Now on aspirin for secondary stroke prevention. Stroke work up underway.   Diverticular disease  IBS  Bipolar affective disorder  Hx cocaine, THC and opiate use  LDL 81  Leukocytosis  Hospital day # 2  TREATMENT/PLAN  Patient produced painting in considering [facility] research study. for secondary stroke prevention. DR. Leonie Man spoke with patient and family. [facility] will follow up.  Needs TEE. Could not schedule today. Plan outpatient.  Followup Dr. Luane School Rinehuls PA-C [facility] Pager [phone] 01/31/2014, 5:00 PM  I have personally obtained a history, examined the patient, evaluated imaging results, and formulated the assessment and plan of care. I agree with the above.  Antony Contras, MD  To contact Stroke Continuity provider, please refer  to [email or web address] After hours, contact General Neurology

## 2014-01-31 NOTE — Progress Notes (Signed)
Occupational Therapy Treatment Patient Details Name: [name] MRN: [id number] DOB: Oct 29, 1956 Today's Date: 01/31/2014    History of present illness pt was admited with R facial droop and right sided weakness.  MRI sowed acute L parietal lobe infarct. She has a PMH significant for bipolar   OT comments  Pt moving very well today.  Pt able to demonstrate toileting tasks and tub transfer at mod I- I levels.  She is independent with gathering and retrieving ADL items without LOB.  Recommended pt await clearance by MD before she drives and pt verbalized understanding.  Pt denies HA and/or nausea today.  No further acute OT needs. Will sign off.  Follow Up Recommendations  Supervision - Intermittent    Equipment Recommendations  None recommended by OT    Recommendations for Other Services      Precautions / Restrictions Precautions Precautions: Fall       Mobility Bed Mobility Overal bed mobility: Independent                Transfers Overall transfer level: Independent Equipment used: None             General transfer comment: Moves quickly but no LOB or safety deficits.    Balance                                   ADL                           Toilet Transfer: Independent;Comfort height toilet;Ambulation   Toileting- Clothing Manipulation and Hygiene: Independent;Sit to/from stand   Tub/ Shower Transfer: Tub transfer;Modified independent;Ambulation   Functional mobility during ADLs: Modified independent General ADL Comments: Pt independent with toileting tasks.  Independently retrieved ADL items at various heights in room.  Pt mod I for tub transfer only due to slightly increased time due to her baseline (per pt report) and need to place hand on wall for safety/support.  Pt able to simulate reaching for feet during as if in shower and even picked item up off tub floor without LOB.       Vision                      Perception     Praxis      Cognition   Behavior During Therapy: WFL for tasks assessed/performed Overall Cognitive Status: Within Functional Limits for tasks assessed                       Extremity/Trunk Assessment               Exercises     Shoulder Instructions       General Comments      Pertinent Vitals/ Pain       No c/o pain  Home Living                                          Prior Functioning/Environment              Frequency Min 2X/week     Progress Toward Goals  OT Goals(current goals can now be found in the care plan section)  Progress towards OT goals: Goals met/education completed, patient discharged from OT  Acute Rehab OT Goals Patient Stated Goal: to go home today OT Goal Formulation: With patient Time For Goal Achievement: 02/06/14 Potential to Achieve Goals: Good ADL Goals Pt Will Transfer to Toilet: with modified independence;regular height toilet;ambulating Pt Will Perform Tub/Shower Transfer: Tub transfer;with supervision;ambulating Additional ADL Goal #1: Pt will gather adl supplies at mod I level  Plan Discharge plan remains appropriate;All goals met and education completed, patient discharged from OT services    Co-evaluation                 End of Session Equipment Utilized During Treatment: Gait belt   Activity Tolerance Patient tolerated treatment well   Patient Left in bed;with call bell/phone within reach   Nurse Communication Mobility status        Time: 3524-8185 OT Time Calculation (min): 15 min  Charges: OT General Charges $OT Visit: 1 Procedure OT Treatments $Self Care/Home Management : 8-22 mins  01/31/2014 Darrol Jump OTR/L Pager [phone] Office [phone]  Darrol Jump 01/31/2014, 9:06 AM

## 2014-01-31 NOTE — Evaluation (Signed)
Physical Therapy Evaluation Patient Details Name: [name] MRN: [id number] DOB: 11/01/1955 Today's Date: 01/31/2014   History of Present Illness  pt was admited with R facial droop and right sided weakness.  MRI sowed acute L parietal lobe infarct. She has a PMH significant for bipolar  Clinical Impression  Pt demonstrates mild instability with higher level balance activities but overall is WFL. No acute PT needs, will sign off. Patient in agreement.    Follow Up Recommendations No PT follow up    Equipment Recommendations       Recommendations for Other Services       Precautions / Restrictions Precautions Precautions: Fall Restrictions Weight Bearing Restrictions: No      Mobility  Bed Mobility Overal bed mobility: Independent                Transfers Overall transfer level: Independent Equipment used: None             General transfer comment: pt tends to move quickly  Ambulation/Gait Ambulation/Gait assistance: Independent Ambulation Distance (Feet): 640 Feet Assistive device: None Gait Pattern/deviations: WFL(Within Functional Limits) Gait velocity: wfl Gait velocity interpretation: at or above normal speed for age/gender General Gait Details: steady with ambulation  Stairs Stairs: Yes Stairs assistance: Modified independent (Device/Increase time) Stair Management: No rails Number of Stairs: 5 General stair comments: one slight LOB able to self correct  Wheelchair Mobility    Modified Rankin (Stroke Patients Only)       Balance Overall balance assessment: No apparent balance deficits (not formally assessed)                               Standardized Balance Assessment Standardized Balance Assessment : Dynamic Gait Index   Dynamic Gait Index Level Surface: Normal Change in Gait Speed: Normal Gait with Horizontal Head Turns: Normal Gait with Vertical Head Turns: Moderate Impairment Gait and Pivot Turn:  Normal Step Over Obstacle: Moderate Impairment Step Around Obstacles: Normal Steps: Mild Impairment Total Score: 19       Pertinent Vitals/Pain No pain at this time    Home Living Family/patient expects to be discharged to:: Private residence Living Arrangements: Spouse/significant other   Type of Home: House Home Access: Stairs to enter   [employer] of Steps: 2-3          Prior Function Level of Independence: Independent               Hand Dominance   Dominant Hand: Right    Extremity/Trunk Assessment   Upper Extremity Assessment: Overall WFL for tasks assessed           Lower Extremity Assessment: Overall WFL for tasks assessed         Communication   Communication: No difficulties (pt states she still has some difficulty expressing herself)  Cognition Arousal/Alertness: Awake/alert Behavior During Therapy: WFL for tasks assessed/performed Overall Cognitive Status: Within Functional Limits for tasks assessed                      General Comments      Exercises        Assessment/Plan    PT Assessment Patent does not need any further PT services           PT Goals (Current goals can be found in the Care Plan section) Acute Rehab PT Goals Patient Stated Goal: get rid of this headache PT Goal  Formulation: No goals set, d/c therapy               End of Session Equipment Utilized During Treatment: Gait belt Activity Tolerance: Patient tolerated treatment well Patient left: in bed;with call bell/phone within reach Nurse Communication: Mobility status         Time: 2423-5361 PT Time Calculation (min): 15 min   Charges:   PT Evaluation $Initial PT Evaluation Tier I: 1 Procedure PT Treatments $Gait Training: 8-22 mins   PT G CodesDuncan Dull 01/31/2014, 10:55 AM Alben Deeds, PT DPT  [phone]

## 2014-02-06 ENCOUNTER — Ambulatory Visit
Admission: RE | Admit: 2014-02-06 | Discharge: 2014-02-06 | Disposition: A | Discharge status: Home / Self Care | Payer: Medicare Other | Source: Ambulatory Visit | Attending: Internal Medicine | Admitting: Internal Medicine

## 2014-02-06 DIAGNOSIS — R921 Mammographic calcification found on diagnostic imaging of breast: Secondary | ICD-10-CM

## 2014-02-07 ENCOUNTER — Telehealth: Payer: Self-pay | Admitting: Neurology

## 2014-02-07 NOTE — Telephone Encounter (Signed)
Patient calling to state that she had a stroke last week and states that Dr. Leonie Man wanted her to be seen as soon as possible, despite me telling her we always schedule a 2 month hospital follow up. Please call and advise patient.

## 2014-02-10 NOTE — Telephone Encounter (Signed)
Called patient to advise follow up in 2 months with Dr. Leonie Man as instructed per discharge summary. No answer.

## 2014-02-11 NOTE — Telephone Encounter (Signed)
Called patient twice. No answer.

## 2014-04-03 ENCOUNTER — Encounter (HOSPITAL_COMMUNITY): Payer: Self-pay | Admitting: Emergency Medicine

## 2014-04-03 ENCOUNTER — Observation Stay (HOSPITAL_COMMUNITY)
Admission: EM | Admit: 2014-04-03 | Discharge: 2014-04-03 | Disposition: A | Discharge status: Home / Self Care | Payer: Medicare Other | Attending: Emergency Medicine | Admitting: Emergency Medicine

## 2014-04-03 ENCOUNTER — Emergency Department (HOSPITAL_COMMUNITY): Payer: Medicare Other

## 2014-04-03 DIAGNOSIS — R1115 Cyclical vomiting syndrome unrelated to migraine: Secondary | ICD-10-CM | POA: Diagnosis present

## 2014-04-03 DIAGNOSIS — K589 Irritable bowel syndrome without diarrhea: Secondary | ICD-10-CM | POA: Insufficient documentation

## 2014-04-03 DIAGNOSIS — F319 Bipolar disorder, unspecified: Secondary | ICD-10-CM | POA: Insufficient documentation

## 2014-04-03 DIAGNOSIS — K449 Diaphragmatic hernia without obstruction or gangrene: Secondary | ICD-10-CM | POA: Insufficient documentation

## 2014-04-03 DIAGNOSIS — F1910 Other psychoactive substance abuse, uncomplicated: Secondary | ICD-10-CM

## 2014-04-03 DIAGNOSIS — R1013 Epigastric pain: Secondary | ICD-10-CM | POA: Insufficient documentation

## 2014-04-03 DIAGNOSIS — K219 Gastro-esophageal reflux disease without esophagitis: Secondary | ICD-10-CM | POA: Insufficient documentation

## 2014-04-03 DIAGNOSIS — R112 Nausea with vomiting, unspecified: Principal | ICD-10-CM | POA: Insufficient documentation

## 2014-04-03 LAB — COMPREHENSIVE METABOLIC PANEL
ALK PHOS: 131 U/L — AB (ref 39–117)
ALT: 12 U/L (ref 0–35)
AST: 21 U/L (ref 0–37)
Albumin: 4.1 g/dL (ref 3.5–5.2)
BILIRUBIN TOTAL: 0.2 mg/dL — AB (ref 0.3–1.2)
BUN: 12 mg/dL (ref 6–23)
CO2: 20 mEq/L (ref 19–32)
CREATININE: 0.52 mg/dL (ref 0.50–1.10)
Calcium: 9.5 mg/dL (ref 8.4–10.5)
Chloride: 100 mEq/L (ref 96–112)
GFR calc Af Amer: >90 mL/min (ref >90)
Glucose, Bld: 109 mg/dL — ABNORMAL HIGH (ref 70–99)
POTASSIUM: 3.9 meq/L (ref 3.7–5.3)
Sodium: 138 mEq/L (ref 137–147)
Total Protein: 7.7 g/dL (ref 6.0–8.3)

## 2014-04-03 LAB — CBC WITH DIFFERENTIAL/PLATELET
Basophils Absolute: 0 10*3/uL (ref 0.0–0.1)
Basophils Relative: 0 % (ref 0–1)
Eosinophils Absolute: 0 10*3/uL (ref 0.0–0.7)
Eosinophils Relative: 0 % (ref 0–5)
HEMATOCRIT: 44.8 % (ref 36.0–46.0)
Hemoglobin: 16.1 g/dL — ABNORMAL HIGH (ref 12.0–15.0)
LYMPHS ABS: 1.6 10*3/uL (ref 0.7–4.0)
Lymphocytes Relative: 13 % (ref 12–46)
MCH: 34 pg (ref 26.0–34.0)
MCHC: 35.9 g/dL (ref 30.0–36.0)
MCV: 94.5 fL (ref 78.0–100.0)
MONO ABS: 0.4 10*3/uL (ref 0.1–1.0)
MONOS PCT: 3 % (ref 3–12)
NEUTROS ABS: 10.2 10*3/uL — AB (ref 1.7–7.7)
NEUTROS PCT: 84 % — AB (ref 43–77)
Platelets: 327 10*3/uL (ref 150–400)
RBC: 4.74 MIL/uL (ref 3.87–5.11)
RDW: 13.4 % (ref 11.5–15.5)
WBC: 12.2 10*3/uL — ABNORMAL HIGH (ref 4.0–10.5)

## 2014-04-03 LAB — LIPASE, BLOOD: LIPASE: 12 U/L (ref 11–59)

## 2014-04-03 LAB — I-STAT TROPONIN, ED: Troponin i, poc: 0 ng/mL (ref 0.00–0.08)

## 2014-04-03 LAB — I-STAT CG4 LACTIC ACID, ED: Lactic Acid, Venous: 0.84 mmol/L (ref 0.5–2.2)

## 2014-04-03 MED ORDER — PROMETHAZINE HCL 25 MG PO TABS: 25.0000 mg | ORAL_TABLET | Freq: Four times a day (QID) | ORAL | Status: DC | PRN

## 2014-04-03 MED ORDER — PROMETHAZINE HCL 25 MG/ML IJ SOLN
25.0000 mg | INTRAMUSCULAR | Status: DC | PRN
  Administered 2014-04-03: 25 mg via INTRAVENOUS
  Filled 2014-04-03: qty 1

## 2014-04-03 MED ORDER — PROCHLORPERAZINE MALEATE 10 MG PO TABS: 10.0000 mg | ORAL_TABLET | Freq: Two times a day (BID) | ORAL | Status: DC | PRN

## 2014-04-03 MED ORDER — METOCLOPRAMIDE HCL 5 MG/ML IJ SOLN: 10.0000 mg | Freq: Once | INTRAMUSCULAR | Status: DC

## 2014-04-03 MED ORDER — MORPHINE SULFATE 4 MG/ML IJ SOLN
4.0000 mg | Freq: Once | INTRAMUSCULAR | Status: AC
  Administered 2014-04-03: 4 mg via INTRAVENOUS
  Filled 2014-04-03: qty 1

## 2014-04-03 MED ORDER — SODIUM CHLORIDE 0.9 % IV BOLUS (SEPSIS)
1000.0000 mL | Freq: Once | INTRAVENOUS | Status: AC
  Administered 2014-04-03: 1000 mL via INTRAVENOUS

## 2014-04-03 MED ORDER — GI COCKTAIL ~~LOC~~
30.0000 mL | Freq: Once | ORAL | Status: AC
  Administered 2014-04-03: 30 mL via ORAL
  Filled 2014-04-03: qty 30

## 2014-04-03 MED ORDER — SODIUM CHLORIDE 0.9 % IV BOLUS (SEPSIS): 1000.0000 mL | Freq: Once | INTRAVENOUS | Status: DC

## 2014-04-03 MED ORDER — ONDANSETRON HCL 4 MG/2ML IJ SOLN
4.0000 mg | Freq: Once | INTRAMUSCULAR | Status: AC
  Administered 2014-04-03: 4 mg via INTRAVENOUS
  Filled 2014-04-03: qty 2

## 2014-04-03 MED ORDER — PROCHLORPERAZINE EDISYLATE 5 MG/ML IJ SOLN
10.0000 mg | Freq: Once | INTRAMUSCULAR | Status: AC
  Administered 2014-04-03: 10 mg via INTRAVENOUS
  Filled 2014-04-03: qty 2

## 2014-04-03 NOTE — ED Provider Notes (Addendum)
TIME SEEN: 12:35 PM  CHIEF COMPLAINT: Nausea, vomiting, abdominal pain  HPI: Patient is a 58 y.o. F with history of irritable bowel syndrome, hiatal hernia, diverticulitis, GERD, bipolar affective disorder, prior stroke who presents emergency department with complaints of nausea that started around midnight last night and nonbloody, nonbilious vomiting that started at 7 AM. She has had multiple episodes in the past of intractable vomiting. She is having epigastric pain that she states is due to her frequent vomiting but no other abdominal pain. She denies any diarrhea. No fevers. No chest pain or shortness of breath. No bloody stool or melena. No dysuria or hematuria. No vaginal bleeding or discharge. She has been taking Phenergan at home without relief.  No history of abdominal surgery. No history of cardiac disease. She states this feels like her cyclical vomiting.  PSP is [facility]  ROS: See HPI Constitutional: no fever  Eyes: no drainage  ENT: no runny nose   Cardiovascular:  no chest pain  Resp: no SOB  GI:  vomiting GU: no dysuria Integumentary: no rash  Allergy: no hives  Musculoskeletal: no leg swelling  Neurological: no slurred speech ROS otherwise negative  PAST MEDICAL HISTORY/PAST SURGICAL HISTORY:  Past Medical History  Diagnosis Date  . Hiatal hernia   . IBS (irritable bowel syndrome)   . Diverticulitis   . Bipolar affective disorder   . GERD (gastroesophageal reflux disease)     MEDICATIONS:  Prior to Admission medications   Medication Sig Start Date End Date Taking? Authorizing Provider  cyclobenzaprine (FLEXERIL) 10 MG tablet Take 10 mg by mouth 3 (three) times daily as needed for muscle spasms. For muscle spasms in back    Historical Provider, MD  diclofenac sodium (VOLTAREN) 1 % GEL Apply 1 g topically daily. To back and elbow    Historical Provider, MD  dicyclomine (BENTYL) 20 MG tablet Take 20 mg by mouth 3 (three) times daily as needed. For  intestinal spasms    Historical Provider, MD  diphenhydrAMINE (SOMINEX) 25 MG tablet Take 25 mg by mouth 3 (three) times daily as needed for allergies or sleep.    Historical Provider, MD  fluticasone (FLONASE) 50 MCG/ACT nasal spray Place 2 sprays into the nose daily.    Historical Provider, MD  lamoTRIgine (LAMICTAL) 150 MG tablet Take 150 mg by mouth daily.     Historical Provider, MD  Multiple Vitamin (MULITIVITAMIN WITH MINERALS) TABS Take 1 tablet by mouth daily.    Historical Provider, MD  perphenazine (TRILAFON) 4 MG tablet Take 4 mg by mouth 3 (three) times daily.     Historical Provider, MD  pramipexole (MIRAPEX) 0.125 MG tablet Take 0.125 mg by mouth at bedtime.    Historical Provider, MD  promethazine (PHENERGAN) 25 MG suppository Place 1 suppository (25 mg total) rectally every 6 (six) hours as needed for nausea or vomiting. 01/26/14   Tatyana A Kirichenko, PA-C  promethazine (PHENERGAN) 25 MG tablet Take 25 mg by mouth every 6 (six) hours as needed. For nausea/vomiting    Historical Provider, MD  research study medication Take 100 mg by mouth daily. 01/31/14   Donne Hazel, MD  research study medication Take 90 mg by mouth 2 (two) times daily. 01/31/14   Donne Hazel, MD  venlafaxine XR (EFFEXOR-XR) 75 MG 24 hr capsule Take 75 mg by mouth daily.    Historical Provider, MD    ALLERGIES:  No Known Allergies  SOCIAL HISTORY:  History  Substance Use Topics  .  Smoking status: Never Smoker   . Smokeless tobacco: Not on file  . Alcohol Use: No    FAMILY HISTORY: History reviewed. No pertinent family history.  EXAM: BP 122/89  Pulse 89  Temp(Src) 97.9 F (36.6 C) (Oral)  Resp 18  Ht 5\' 6"  (1.676 m)  Wt 175 lb (79.379 kg)  BMI 28.26 kg/m2  SpO2 99% CONSTITUTIONAL: Alert and oriented and responds appropriately to questions. Appears uncomfortable, nontoxic, disheveled, dry heaving HEAD: Normocephalic EYES: Conjunctivae clear, PERRL ENT: normal nose; no rhinorrhea; moist  mucous membranes; pharynx without lesions noted NECK: Supple, no meningismus, no LAD  CARD: RRR; S1 and S2 appreciated; no murmurs, no clicks, no rubs, no gallops RESP: Normal chest excursion without splinting or tachypnea; breath sounds clear and equal bilaterally; no wheezes, no rhonchi, no rales,  ABD/GI: Normal bowel sounds; non-distended; soft, mildly tender to palpation in the epigastric region with no rebound or guarding, no peritoneal signs, negative Murphy sign BACK:  The back appears normal and is non-tender to palpation, there is no CVA tenderness EXT: Normal ROM in all joints; non-tender to palpation; no edema; normal capillary refill; no cyanosis    SKIN: Normal color for age and race; warm NEURO: Moves all extremities equally, sensation to light touch intact diffusely, cranial nerves II through XII intact PSYCH: The patient's mood and manner are appropriate. Grooming and personal hygiene are appropriate.  MEDICAL DECISION MAKING: Patient here with nausea and vomiting. Her abdominal exam is benign. She is hemodynamically stable. She's had a history of chronic nausea and vomiting in the past. Differential diagnosis includes peptic ulcer, gastritis, ACS, pancreatitis, DKA, colitis, gastroparesis. We'll give IV fluids and IV Phenergan. We'll also give a small amount of IV morphine. We'll obtain abdominal labs and troponin, urine, EKG, acute abdominal series.  ED PROGRESS: Patient is still vomiting despite Phenergan.  Will give Zofran and Compazine now.  It appears based on chronic notes that her symptoms are due to marijuana use. Urine drug screen is pending. If Zofran and Compazine did not help his symptoms, we'll give Reglan. If this does not help will likely give Ativan. Signed out to Dr. Maryan Rued who will reassess patient.  If pt still vomiting, will need admission to [facility].   EKG Interpretation  Date/Time:  Thursday April 03 2014 13:11:33 EDT Ventricular Rate:  76 PR  Interval:  176 QRS Duration: 88 QT Interval:  377 QTC Calculation: 424 R Axis:   62 Text Interpretation:  Sinus rhythm Confirmed by Elainah Rhyne,  DO, Jalik Gellatly [id number]) on 04/03/2014 1:33:13 PM        Delice Bison Zaydn Gutridge, DO 04/03/14 [place], DO 04/03/14 1548  4:19 PM  Pt feels much better after compazine and zofran.  She has no further N/V and feels ready for dc home.  Will dc with Rx for phenergan and compazine.  It appears compazine is what helps most with her symptoms.  D/w pt return precautions and supportive care instructions.  She verbalized understanding and is comfortable with plan.   [place], DO 04/03/14 1620

## 2014-04-03 NOTE — ED Notes (Signed)
Pt presents with onset of nausea, vomiting that began at midnight last night.  Onset of epigastric pain this morning, denies any diarrhea.

## 2014-04-03 NOTE — ED Notes (Signed)
Dr. Ward at the bedside.  

## 2014-04-03 NOTE — ED Notes (Addendum)
Phlebotomy at the bedside. Student attempted IV x2. Attempted IV x1.

## 2014-04-03 NOTE — Discharge Instructions (Signed)
Cyclic Vomiting Syndrome °Cyclic vomiting syndrome is a benign condition in which patients experience bouts or cycles of severe nausea and vomiting that last for hours or even days. The bouts of nausea and vomiting alternate with longer periods of no symptoms and generally good health. Cyclic vomiting syndrome occurs mostly in children, but can affect adults. °CAUSES  °CVS has no known cause. Each episode is typically similar to the previous ones. The episodes tend to:  °· Start at about the same time of day. °· Last the same length of time. °· Present the same symptoms at the same level of intensity. °Cyclic vomiting syndrome can begin at any age in children and adults. Cyclic vomiting syndrome usually starts between the ages of 3 and 7 years. In adults, episodes tend to occur less often than they do in children, but they last longer. Furthermore, the events or situations that trigger episodes in adults cannot always be pinpointed as easily as they can in children. °There are 4 phases of cyclic vomiting syndrome: °1. Prodrome. The prodrome phase signals that an episode of nausea and vomiting is about to begin. This phase can last from just a few minutes to several hours. This phase is often marked by belly (abdominal) pain. Sometimes taking medicine early in the prodrome phase can stop an episode in progress. However, sometimes there is no warning. A person may simply wake up in the middle of the night or early morning and begin vomiting. °2. Episode. The episode phase consists of: °· Severe vomiting. °· Nausea. °· Gagging (retching). °3. Recovery. The recovery phase begins when the nausea and vomiting stop. Healthy color, appetite, and energy return. °4. Symptom-free interval. The symptom-free interval phase is the period between episodes when no symptoms are present. °TRIGGERS °Episodes can be triggered by an infection or event. Examples of triggers include: °· Infections. °· Colds, allergies, sinus problems, and  the flu. °· Eating certain foods such as chocolate or cheese. °· Foods with monosodium glutamate (MSG) or preservatives. °· Fast foods. °· Pre-packaged foods. °· Foods with low nutritional value (junk foods). °· Overeating. °· Eating just before going to bed. °· Hot weather. °· Dehydration. °· Not enough sleep or poor sleep quality. °· Physical exhaustion. °· Menstruation. °· Motion sickness. °· Emotional stress (school or home difficulties). °· Excitement or stress. °SYMPTOMS  °The main symptoms of cyclic vomiting syndrome are: °· Severe vomiting. °· Nausea. °· Gagging (retching). °Episodes usually begin at night or the first thing in the morning. Episodes may include vomiting or retching up to 5 or 6 times an hour during the worst of the episode. Episodes usually last anywhere from 1 to 4 days. Episodes can last for up to 10 days. Other symptoms include: °· Paleness. °· Exhaustion. °· Listlessness. °· Abdominal pain. °· Loose stools or diarrhea. °Sometimes the nausea and vomiting are so severe that a person appears to be almost unconscious. Sensitivity to light, headache, fever, dizziness, may also accompany an episode. In addition, the vomiting may cause drooling and excessive thirst. Drinking water usually leads to more vomiting, though the water can dilute the acid in the vomit, making the episode a little less painful. Continuous vomiting can lead to dehydration, which means that the body has lost excessive water and salts. °DIAGNOSIS  °Cyclic vomiting syndrome is hard to diagnose because there are no clear tests to identify it. A caregiver must diagnose cyclic vomiting syndrome by looking at symptoms and medical history. A caregiver must exclude more common diseases   or disorders that can also cause nausea and vomiting. Also, diagnosis takes time because caregivers need to identify a pattern or cycle to the vomiting. TREATMENT  Cyclic vomiting syndrome cannot be cured. Treatment varies, but people with  cyclic vomiting syndrome should get plenty of rest and sleep and take medications that prevent, stop, or lessen the vomiting episodes and other symptoms. People whose episodes are frequent and long-lasting may be treated during the symptom-free intervals in an effort to prevent or ease future episodes. The symptom-free phase is a good time to eliminate anything known to trigger an episode. For example, if episodes are brought on by stress or excitement, this period is the time to find ways to reduce stress and stay calm. If sinus problems or allergies cause episodes, those conditions should be treated. The triggers listed above should be avoided or prevented. Because of the similarities between migraine and cyclic vomiting syndrome, caregivers treat some people with severe cyclic vomiting syndrome with drugs that are also used for migraine headaches. The drugs are designed to:  Prevent episodes.  Reduce their frequency.  Lessen their severity. HOME CARE INSTRUCTIONS Once a vomiting episode begins, treatment is supportive. It helps to stay in bed and sleep in a dark, quiet room. Severe nausea and vomiting may require hospitalization and intravenous (IV) fluids to prevent dehydration. Relaxing medications (sedatives) may help if the nausea continues. Sometimes, during the prodrome phase, it is possible to stop an episode from happening altogether. Only take over-the-counter or prescription medicines for pain, discomfort or fever as directed by your caregiver. Do not give aspirin to children. During the recovery phase, drinking water and replacing lost electrolytes (salts in the blood) are very important. Electrolytes are salts that the body needs to function well and stay healthy. Symptoms during the recovery phase can vary. Some people find that their appetites return to normal immediately, while others need to begin by drinking clear liquids and then move slowly to solid food. RELATED COMPLICATIONS The  severe vomiting that defines cyclic vomiting syndrome is a risk factor for several complications:  Dehydration Vomiting causes the body to lose water quickly.  Electrolyte imbalance Vomiting also causes the body to lose the important salts it needs to keep working properly.  Peptic esophagitis The tube that connects the mouth to the stomach (esophagus) becomes injured from the stomach acid that comes up with the vomit.  Hematemesis The esophagus becomes irritated and bleeds, so blood mixes with the vomit.  Mallory-Weiss tear The lower end of the esophagus may tear open or the stomach may bruise from vomiting or retching.  Tooth decay The acid in the vomit can hurt the teeth by corroding the tooth enamel. SEEK MEDICAL CARE IF: You have questions or problems. Document Released: 12/26/2001 Document Revised: 01/09/2012 Document Reviewed: 01/24/2011 [facility] Patient Information 2014 [place], [place].  Polysubstance Abuse When people abuse more than one drug or type of drug it is called polysubstance or polydrug abuse. For example, many smokers also drink alcohol. This is one form of polydrug abuse. Polydrug abuse also refers to the use of a drug to counteract an unpleasant effect produced by another drug. It may also be used to help with withdrawal from another drug. People who take stimulants may become agitated. Sometimes this agitation is countered with a tranquilizer. This helps protect against the unpleasant side effects. Polydrug abuse also refers to the use of different drugs at the same time.  Anytime drug use is interfering with normal living activities, it  has become abuse. This includes problems with family and friends. Psychological dependence has developed when your mind tells you that the drug is needed. This is usually followed by physical dependence which has developed when continuing increases of drug are required to get the same feeling or "high". This is known as addiction or  chemical dependency. A person's risk is much higher if there is a history of chemical dependency in the family. SIGNS OF CHEMICAL DEPENDENCY  You have been told by friends or family that drugs have become a problem.  You fight when using drugs.  You are having blackouts (not remembering what you do while using).  You feel sick from using drugs but continue using.  You lie about use or amounts of drugs (chemicals) used.  You need chemicals to get you going.  You are suffering in work performance or in school because of drug use.  You get sick from use of drugs but continue to use anyway.  You need drugs to relate to people or feel comfortable in social situations.  You use drugs to forget problems. "Yes" answered to any of the above signs of chemical dependency indicates there are problems. The longer the use of drugs continues, the greater the problems will become. If there is a family history of drug or alcohol use, it is best not to experiment with these drugs. Continual use leads to tolerance. After tolerance develops more of the drug is needed to get the same feeling. This is followed by addiction. With addiction, drugs become the most important part of life. It becomes more important to take drugs than participate in the other usual activities of life. This includes relating to friends and family. Addiction is followed by dependency. Dependency is a condition where drugs are now needed not just to get high, but to feel normal. Addiction cannot be cured but it can be stopped. This often requires outside help and the care of professionals. Treatment centers are listed in the yellow pages under: Cocaine, Narcotics, and Alcoholics Anonymous. Most hospitals and clinics can refer you to a specialized care center. Talk to your caregiver if you need help. Document Released: 06/08/2005 Document Revised: 01/09/2012 Document Reviewed: 10/17/2005 [facility] Patient Information 2014 [place].

## 2014-04-03 NOTE — ED Notes (Signed)
Santiago Glad, RN and Museum/gallery conservator, Phlebotomy at the bedside trying to obtain blood and IV access.

## 2014-04-03 NOTE — ED Notes (Signed)
Patient away at Xray.

## 2014-04-03 NOTE — ED Notes (Signed)
Patient returned from X-ray 

## 2014-04-14 ENCOUNTER — Emergency Department (HOSPITAL_COMMUNITY)
Admission: EM | Admit: 2014-04-14 | Discharge: 2014-04-14 | Disposition: A | Discharge status: Home / Self Care | Payer: Medicare Other | Attending: Emergency Medicine | Admitting: Emergency Medicine

## 2014-04-14 ENCOUNTER — Encounter (HOSPITAL_COMMUNITY): Payer: Self-pay | Admitting: Emergency Medicine

## 2014-04-14 DIAGNOSIS — Z7982 Long term (current) use of aspirin: Secondary | ICD-10-CM | POA: Insufficient documentation

## 2014-04-14 DIAGNOSIS — Z791 Long term (current) use of non-steroidal anti-inflammatories (NSAID): Secondary | ICD-10-CM | POA: Insufficient documentation

## 2014-04-14 DIAGNOSIS — R197 Diarrhea, unspecified: Secondary | ICD-10-CM | POA: Insufficient documentation

## 2014-04-14 DIAGNOSIS — R1115 Cyclical vomiting syndrome unrelated to migraine: Secondary | ICD-10-CM | POA: Insufficient documentation

## 2014-04-14 DIAGNOSIS — F319 Bipolar disorder, unspecified: Secondary | ICD-10-CM | POA: Insufficient documentation

## 2014-04-14 DIAGNOSIS — IMO0002 Reserved for concepts with insufficient information to code with codable children: Secondary | ICD-10-CM | POA: Insufficient documentation

## 2014-04-14 DIAGNOSIS — Z79899 Other long term (current) drug therapy: Secondary | ICD-10-CM | POA: Insufficient documentation

## 2014-04-14 LAB — LIPASE, BLOOD: Lipase: 12 U/L (ref 11–59)

## 2014-04-14 LAB — COMPREHENSIVE METABOLIC PANEL
ALT: 10 U/L (ref 0–35)
AST: 19 U/L (ref 0–37)
Albumin: 4.4 g/dL (ref 3.5–5.2)
Alkaline Phosphatase: 131 U/L — ABNORMAL HIGH (ref 39–117)
BUN: 12 mg/dL (ref 6–23)
CALCIUM: 9.8 mg/dL (ref 8.4–10.5)
CO2: 20 mEq/L (ref 19–32)
CREATININE: 0.56 mg/dL (ref 0.50–1.10)
Chloride: 103 mEq/L (ref 96–112)
GFR calc Af Amer: >90 mL/min (ref >90)
Glucose, Bld: 152 mg/dL — ABNORMAL HIGH (ref 70–99)
Potassium: 3.8 mEq/L (ref 3.7–5.3)
Sodium: 141 mEq/L (ref 137–147)
TOTAL PROTEIN: 8.1 g/dL (ref 6.0–8.3)
Total Bilirubin: 0.3 mg/dL (ref 0.3–1.2)

## 2014-04-14 LAB — URINALYSIS, ROUTINE W REFLEX MICROSCOPIC
Bilirubin Urine: NEGATIVE
Glucose, UA: NEGATIVE mg/dL
Ketones, ur: 15 mg/dL — AB
LEUKOCYTES UA: NEGATIVE
Nitrite: NEGATIVE
PROTEIN: 100 mg/dL — AB
Specific Gravity, Urine: 1.018 (ref 1.005–1.030)
Urobilinogen, UA: 0.2 mg/dL (ref 0.0–1.0)
pH: 7.5 (ref 5.0–8.0)

## 2014-04-14 LAB — CBC WITH DIFFERENTIAL/PLATELET
BASOS ABS: 0 10*3/uL (ref 0.0–0.1)
BASOS PCT: 0 % (ref 0–1)
EOS ABS: 0 10*3/uL (ref 0.0–0.7)
EOS PCT: 0 % (ref 0–5)
HEMATOCRIT: 46.7 % — AB (ref 36.0–46.0)
Hemoglobin: 16.4 g/dL — ABNORMAL HIGH (ref 12.0–15.0)
LYMPHS PCT: 13 % (ref 12–46)
Lymphs Abs: 1.9 10*3/uL (ref 0.7–4.0)
MCH: 33.2 pg (ref 26.0–34.0)
MCHC: 35.1 g/dL (ref 30.0–36.0)
MCV: 94.5 fL (ref 78.0–100.0)
MONO ABS: 0.3 10*3/uL (ref 0.1–1.0)
Monocytes Relative: 2 % — ABNORMAL LOW (ref 3–12)
Neutro Abs: 12.6 10*3/uL — ABNORMAL HIGH (ref 1.7–7.7)
Neutrophils Relative %: 85 % — ABNORMAL HIGH (ref 43–77)
Platelets: 311 10*3/uL (ref 150–400)
RBC: 4.94 MIL/uL (ref 3.87–5.11)
RDW: 13.7 % (ref 11.5–15.5)
WBC: 14.9 10*3/uL — ABNORMAL HIGH (ref 4.0–10.5)

## 2014-04-14 LAB — URINE MICROSCOPIC-ADD ON

## 2014-04-14 MED ORDER — HYDROXYZINE HCL 50 MG/ML IM SOLN
50.0000 mg | Freq: Four times a day (QID) | INTRAMUSCULAR | Status: DC | PRN
  Filled 2014-04-14 (×2): qty 1

## 2014-04-14 MED ORDER — GI COCKTAIL ~~LOC~~
30.0000 mL | Freq: Once | ORAL | Status: AC
  Administered 2014-04-14: 30 mL via ORAL
  Filled 2014-04-14: qty 30

## 2014-04-14 MED ORDER — PROMETHAZINE HCL 25 MG/ML IJ SOLN
50.0000 mg | Freq: Once | INTRAMUSCULAR | Status: AC
  Administered 2014-04-14: 50 mg via INTRAMUSCULAR
  Filled 2014-04-14: qty 2

## 2014-04-14 MED ORDER — LORAZEPAM 2 MG/ML IJ SOLN
2.0000 mg | Freq: Once | INTRAMUSCULAR | Status: AC
  Administered 2014-04-14: 2 mg via INTRAMUSCULAR
  Filled 2014-04-14: qty 1

## 2014-04-14 NOTE — ED Provider Notes (Signed)
CSN: [id number]     Arrival date & time 04/14/14  1340 History   First MD Initiated Contact with Patient 04/14/14 1837     Chief Complaint  Patient presents with  . Emesis     (Consider location/radiation/quality/duration/timing/severity/associated sxs/prior Treatment) HPI  [name] is a 58 y.o. female with recurrent vomiting. She had 1 episode of diarrhea. No relief with Phenergan or Compazine. Eval. 10 days ago for same. She requests, Phenergan, and pain medicine. She does not have a GI doctor. Her PCP prescribes antiemetics, chronically. She denies recent fever, chills, diarrhea, chest pain, weakness, or dizziness. She complains of sweating that started today. There are no other known modifying factors.   Past Medical History  Diagnosis Date  . Hiatal hernia   . IBS (irritable bowel syndrome)   . Diverticulitis   . Bipolar affective disorder   . GERD (gastroesophageal reflux disease)    Past Surgical History  Procedure Laterality Date  . Abdominal hysterectomy    . Breast lumpectomy     No family history on file. History  Substance Use Topics  . Smoking status: Never Smoker   . Smokeless tobacco: Not on file  . Alcohol Use: No   OB History   Grav Para Term Preterm Abortions TAB SAB Ect Mult Living                 Review of Systems  All other systems reviewed and are negative.     Allergies  Review of patient's allergies indicates no known allergies.  Home Medications   Prior to Admission medications   Medication Sig Start Date End Date Taking? Authorizing Provider  aspirin EC 325 MG tablet Take 325 mg by mouth daily.   Yes Historical Provider, MD  cyclobenzaprine (FLEXERIL) 10 MG tablet Take 10 mg by mouth 3 (three) times daily as needed for muscle spasms. For muscle spasms in back   Yes Historical Provider, MD  diclofenac sodium (VOLTAREN) 1 % GEL Apply 1 g topically daily. To back and elbow   Yes Historical Provider, MD  dicyclomine (BENTYL) 20 MG  tablet Take 20 mg by mouth 3 (three) times daily as needed. For intestinal spasms   Yes Historical Provider, MD  diphenhydrAMINE (SOMINEX) 25 MG tablet Take 25 mg by mouth 3 (three) times daily as needed for allergies or sleep.   Yes Historical Provider, MD  fluticasone (FLONASE) 50 MCG/ACT nasal spray Place 2 sprays into the nose daily.   Yes Historical Provider, MD  lamoTRIgine (LAMICTAL) 150 MG tablet Take 150 mg by mouth daily.    Yes Historical Provider, MD  Multiple Vitamin (MULITIVITAMIN WITH MINERALS) TABS Take 1 tablet by mouth daily.   Yes Historical Provider, MD  perphenazine (TRILAFON) 4 MG tablet Take 4 mg by mouth 3 (three) times daily.    Yes Historical Provider, MD  pramipexole (MIRAPEX) 0.125 MG tablet Take 0.125 mg by mouth at bedtime.   Yes Historical Provider, MD  prochlorperazine (COMPAZINE) 10 MG tablet Take 1 tablet (10 mg total) by mouth 2 (two) times daily as needed for nausea or vomiting. 04/03/14  Yes Kristen N Ward, DO  venlafaxine XR (EFFEXOR-XR) 75 MG 24 hr capsule Take 75 mg by mouth daily.   Yes Historical Provider, MD  promethazine (PHENERGAN) 25 MG tablet Take 1 tablet (25 mg total) by mouth every 6 (six) hours as needed for nausea or vomiting. 04/03/14   Kristen N Ward, DO   BP 163/94  Pulse 104  Temp(Src) 97.8 F (36.6 C) (Oral)  Resp 22  SpO2 95% Physical Exam  Nursing note and vitals reviewed. Constitutional: She is oriented to person, place, and time. She appears well-developed and well-nourished.  HENT:  Head: Normocephalic and atraumatic.  Mouth/Throat: No oropharyngeal exudate.  Mucous membranes moist  Eyes: Conjunctivae and EOM are normal. Pupils are equal, round, and reactive to light. Right eye exhibits no discharge. Left eye exhibits no discharge. No scleral icterus.  Neck: Normal range of motion and phonation normal. Neck supple.  Cardiovascular: Normal rate, regular rhythm and intact distal pulses.   Pulmonary/Chest: Effort normal and breath  sounds normal. She exhibits no tenderness.  Abdominal: Soft. She exhibits no distension. There is no tenderness (Epigastric, mild). There is no guarding.  Musculoskeletal: Normal range of motion.  Neurological: She is alert and oriented to person, place, and time. She exhibits normal muscle tone.  Skin: Skin is warm.  Diaphoretic  Psychiatric: She has a normal mood and affect. Her behavior is normal. Judgment and thought content normal.    ED Course  Procedures (including critical care time)  Medications  gi cocktail (Maalox,Lidocaine,Donnatal) (30 mLs Oral Given 04/14/14 1905)  LORazepam (ATIVAN) injection 2 mg (2 mg Intramuscular Given 04/14/14 1952)  promethazine (PHENERGAN) injection 50 mg (50 mg Intramuscular Given 04/14/14 2035)    Patient Vitals for the past 24 hrs:  BP Temp Temp src Pulse Resp SpO2  04/14/14 2215 163/94 mmHg - - 104 22 95 %  04/14/14 2200 133/98 mmHg - - 115 28 94 %  04/14/14 2120 - - - 100 22 98 %  04/14/14 2100 119/82 mmHg - - 117 23 96 %  04/14/14 2000 178/93 mmHg - - 85 20 96 %  04/14/14 1900 127/90 mmHg - - 80 11 96 %  04/14/14 1800 113/95 mmHg - - 76 12 96 %  04/14/14 1715 101/54 mmHg - - 73 9 95 %  04/14/14 1647 107/56 mmHg - - - 13 100 %  04/14/14 1402 129/92 mmHg 97.8 F (36.6 C) Oral 73 21 97 %    10:55 PM Reevaluation with update and discussion. After initial assessment and treatment, an updated evaluation reveals she is better now, and states that she wants to go home. [name] L    Labs Review Labs Reviewed  CBC WITH DIFFERENTIAL - Abnormal; Notable for the following:    WBC 14.9 (*)    Hemoglobin 16.4 (*)    HCT 46.7 (*)    Neutrophils Relative % 85 (*)    Neutro Abs 12.6 (*)    Monocytes Relative 2 (*)    All other components within normal limits  COMPREHENSIVE METABOLIC PANEL - Abnormal; Notable for the following:    Glucose, Bld 152 (*)    Alkaline Phosphatase 131 (*)    All other components within normal limits   URINALYSIS, ROUTINE W REFLEX MICROSCOPIC - Abnormal; Notable for the following:    APPearance TURBID (*)    Hgb urine dipstick MODERATE (*)    Ketones, ur 15 (*)    Protein, ur 100 (*)    All other components within normal limits  URINE MICROSCOPIC-ADD ON - Abnormal; Notable for the following:    Bacteria, UA FEW (*)    All other components within normal limits  LIPASE, BLOOD    Imaging Review No results found.   EKG Interpretation None      MDM   Final diagnoses:  Cyclic vomiting syndrome    CVS with recurrent sx.  Signs of narcotic seeking behavior.  Nursing Notes Reviewed/ Care Coordinated Applicable Imaging Reviewed Interpretation of Laboratory Data incorporated into ED treatment  The patient appears reasonably screened and/or stabilized for discharge and I doubt any other medical condition or other [facility] requiring further screening, evaluation, or treatment in the ED at this time prior to discharge.  Plan: Home Medications- usual; Home Treatments- rest; return here if the recommended treatment, does not improve the symptoms; Recommended follow up- PCP/GI prn    Richarda Blade, MD 04/15/14 1235

## 2014-04-14 NOTE — Discharge Instructions (Signed)
Cyclic Vomiting Syndrome °Cyclic vomiting syndrome is a benign condition in which patients experience bouts or cycles of severe nausea and vomiting that last for hours or even days. The bouts of nausea and vomiting alternate with longer periods of no symptoms and generally good health. Cyclic vomiting syndrome occurs mostly in children, but can affect adults. °CAUSES  °CVS has no known cause. Each episode is typically similar to the previous ones. The episodes tend to:  °· Start at about the same time of day. °· Last the same length of time. °· Present the same symptoms at the same level of intensity. °Cyclic vomiting syndrome can begin at any age in children and adults. Cyclic vomiting syndrome usually starts between the ages of 3 and 7 years. In adults, episodes tend to occur less often than they do in children, but they last longer. Furthermore, the events or situations that trigger episodes in adults cannot always be pinpointed as easily as they can in children. °There are 4 phases of cyclic vomiting syndrome: °1. Prodrome. The prodrome phase signals that an episode of nausea and vomiting is about to begin. This phase can last from just a few minutes to several hours. This phase is often marked by belly (abdominal) pain. Sometimes taking medicine early in the prodrome phase can stop an episode in progress. However, sometimes there is no warning. A person may simply wake up in the middle of the night or early morning and begin vomiting. °2. Episode. The episode phase consists of: °· Severe vomiting. °· Nausea. °· Gagging (retching). °3. Recovery. The recovery phase begins when the nausea and vomiting stop. Healthy color, appetite, and energy return. °4. Symptom-free interval. The symptom-free interval phase is the period between episodes when no symptoms are present. °TRIGGERS °Episodes can be triggered by an infection or event. Examples of triggers include: °· Infections. °· Colds, allergies, sinus problems, and  the flu. °· Eating certain foods such as chocolate or cheese. °· Foods with monosodium glutamate (MSG) or preservatives. °· Fast foods. °· Pre-packaged foods. °· Foods with low nutritional value (junk foods). °· Overeating. °· Eating just before going to bed. °· Hot weather. °· Dehydration. °· Not enough sleep or poor sleep quality. °· Physical exhaustion. °· Menstruation. °· Motion sickness. °· Emotional stress (school or home difficulties). °· Excitement or stress. °SYMPTOMS  °The main symptoms of cyclic vomiting syndrome are: °· Severe vomiting. °· Nausea. °· Gagging (retching). °Episodes usually begin at night or the first thing in the morning. Episodes may include vomiting or retching up to 5 or 6 times an hour during the worst of the episode. Episodes usually last anywhere from 1 to 4 days. Episodes can last for up to 10 days. Other symptoms include: °· Paleness. °· Exhaustion. °· Listlessness. °· Abdominal pain. °· Loose stools or diarrhea. °Sometimes the nausea and vomiting are so severe that a person appears to be almost unconscious. Sensitivity to light, headache, fever, dizziness, may also accompany an episode. In addition, the vomiting may cause drooling and excessive thirst. Drinking water usually leads to more vomiting, though the water can dilute the acid in the vomit, making the episode a little less painful. Continuous vomiting can lead to dehydration, which means that the body has lost excessive water and salts. °DIAGNOSIS  °Cyclic vomiting syndrome is hard to diagnose because there are no clear tests to identify it. A caregiver must diagnose cyclic vomiting syndrome by looking at symptoms and medical history. A caregiver must exclude more common diseases   or disorders that can also cause nausea and vomiting. Also, diagnosis takes time because caregivers need to identify a pattern or cycle to the vomiting. TREATMENT  Cyclic vomiting syndrome cannot be cured. Treatment varies, but people with  cyclic vomiting syndrome should get plenty of rest and sleep and take medications that prevent, stop, or lessen the vomiting episodes and other symptoms. People whose episodes are frequent and long-lasting may be treated during the symptom-free intervals in an effort to prevent or ease future episodes. The symptom-free phase is a good time to eliminate anything known to trigger an episode. For example, if episodes are brought on by stress or excitement, this period is the time to find ways to reduce stress and stay calm. If sinus problems or allergies cause episodes, those conditions should be treated. The triggers listed above should be avoided or prevented. Because of the similarities between migraine and cyclic vomiting syndrome, caregivers treat some people with severe cyclic vomiting syndrome with drugs that are also used for migraine headaches. The drugs are designed to:  Prevent episodes.  Reduce their frequency.  Lessen their severity. HOME CARE INSTRUCTIONS Once a vomiting episode begins, treatment is supportive. It helps to stay in bed and sleep in a dark, quiet room. Severe nausea and vomiting may require hospitalization and intravenous (IV) fluids to prevent dehydration. Relaxing medications (sedatives) may help if the nausea continues. Sometimes, during the prodrome phase, it is possible to stop an episode from happening altogether. Only take over-the-counter or prescription medicines for pain, discomfort or fever as directed by your caregiver. Do not give aspirin to children. During the recovery phase, drinking water and replacing lost electrolytes (salts in the blood) are very important. Electrolytes are salts that the body needs to function well and stay healthy. Symptoms during the recovery phase can vary. Some people find that their appetites return to normal immediately, while others need to begin by drinking clear liquids and then move slowly to solid food. RELATED COMPLICATIONS The  severe vomiting that defines cyclic vomiting syndrome is a risk factor for several complications:  Dehydration Vomiting causes the body to lose water quickly.  Electrolyte imbalance Vomiting also causes the body to lose the important salts it needs to keep working properly.  Peptic esophagitis The tube that connects the mouth to the stomach (esophagus) becomes injured from the stomach acid that comes up with the vomit.  Hematemesis The esophagus becomes irritated and bleeds, so blood mixes with the vomit.  Mallory-Weiss tear The lower end of the esophagus may tear open or the stomach may bruise from vomiting or retching.  Tooth decay The acid in the vomit can hurt the teeth by corroding the tooth enamel. SEEK MEDICAL CARE IF: You have questions or problems. Document Released: 12/26/2001 Document Revised: 01/09/2012 Document Reviewed: 01/24/2011 [facility] Patient Information 2014 [place], [place].

## 2014-04-14 NOTE — ED Notes (Signed)
Visitor came to the desk asking if the pt was going to be admitted or discharged. This RN told the visitor that she was unsure of the plan of care, but would send the pt's nurse in when they became available.

## 2014-04-14 NOTE — ED Notes (Signed)
Pt states that she has had multiple episodes of N/V and abdominal pain that started this morning. Pt was seen last week for same. States that the prescription medications are not working.

## 2014-07-08 ENCOUNTER — Encounter (HOSPITAL_COMMUNITY): Payer: Self-pay | Admitting: Emergency Medicine

## 2014-07-08 ENCOUNTER — Emergency Department (HOSPITAL_COMMUNITY): Payer: Medicare Other

## 2014-07-08 ENCOUNTER — Emergency Department (HOSPITAL_COMMUNITY)
Admission: EM | Admit: 2014-07-08 | Discharge: 2014-07-09 | Disposition: A | Discharge status: Home / Self Care | Payer: Medicare Other | Attending: Emergency Medicine | Admitting: Emergency Medicine

## 2014-07-08 DIAGNOSIS — R1115 Cyclical vomiting syndrome unrelated to migraine: Secondary | ICD-10-CM | POA: Diagnosis not present

## 2014-07-08 DIAGNOSIS — Z79899 Other long term (current) drug therapy: Secondary | ICD-10-CM | POA: Diagnosis not present

## 2014-07-08 DIAGNOSIS — F319 Bipolar disorder, unspecified: Secondary | ICD-10-CM | POA: Insufficient documentation

## 2014-07-08 DIAGNOSIS — K469 Unspecified abdominal hernia without obstruction or gangrene: Secondary | ICD-10-CM | POA: Insufficient documentation

## 2014-07-08 DIAGNOSIS — K589 Irritable bowel syndrome without diarrhea: Secondary | ICD-10-CM | POA: Diagnosis not present

## 2014-07-08 DIAGNOSIS — Z791 Long term (current) use of non-steroidal anti-inflammatories (NSAID): Secondary | ICD-10-CM | POA: Diagnosis not present

## 2014-07-08 DIAGNOSIS — IMO0002 Reserved for concepts with insufficient information to code with codable children: Secondary | ICD-10-CM | POA: Diagnosis not present

## 2014-07-08 DIAGNOSIS — K219 Gastro-esophageal reflux disease without esophagitis: Secondary | ICD-10-CM | POA: Diagnosis not present

## 2014-07-08 DIAGNOSIS — Z7982 Long term (current) use of aspirin: Secondary | ICD-10-CM | POA: Diagnosis not present

## 2014-07-08 DIAGNOSIS — R1013 Epigastric pain: Secondary | ICD-10-CM | POA: Insufficient documentation

## 2014-07-08 DIAGNOSIS — K449 Diaphragmatic hernia without obstruction or gangrene: Secondary | ICD-10-CM

## 2014-07-08 LAB — CBC WITH DIFFERENTIAL/PLATELET
Basophils Absolute: 0 10*3/uL (ref 0.0–0.1)
Basophils Relative: 0 % (ref 0–1)
EOS ABS: 0 10*3/uL (ref 0.0–0.7)
Eosinophils Relative: 0 % (ref 0–5)
HCT: 45.4 % (ref 36.0–46.0)
Hemoglobin: 16.3 g/dL — ABNORMAL HIGH (ref 12.0–15.0)
LYMPHS ABS: 1.6 10*3/uL (ref 0.7–4.0)
Lymphocytes Relative: 11 % — ABNORMAL LOW (ref 12–46)
MCH: 34.2 pg — ABNORMAL HIGH (ref 26.0–34.0)
MCHC: 35.9 g/dL (ref 30.0–36.0)
MCV: 95.2 fL (ref 78.0–100.0)
Monocytes Absolute: 0.3 10*3/uL (ref 0.1–1.0)
Monocytes Relative: 2 % — ABNORMAL LOW (ref 3–12)
NEUTROS PCT: 87 % — AB (ref 43–77)
Neutro Abs: 12 10*3/uL — ABNORMAL HIGH (ref 1.7–7.7)
PLATELETS: 366 10*3/uL (ref 150–400)
RBC: 4.77 MIL/uL (ref 3.87–5.11)
RDW: 13.4 % (ref 11.5–15.5)
WBC: 14 10*3/uL — ABNORMAL HIGH (ref 4.0–10.5)

## 2014-07-08 LAB — COMPREHENSIVE METABOLIC PANEL
ALBUMIN: 4.4 g/dL (ref 3.5–5.2)
ALK PHOS: 121 U/L — AB (ref 39–117)
ALT: 15 U/L (ref 0–35)
AST: 18 U/L (ref 0–37)
Anion gap: 19 — ABNORMAL HIGH (ref 5–15)
BUN: 13 mg/dL (ref 6–23)
CO2: 19 mEq/L (ref 19–32)
Calcium: 9.7 mg/dL (ref 8.4–10.5)
Chloride: 99 mEq/L (ref 96–112)
Creatinine, Ser: 0.49 mg/dL — ABNORMAL LOW (ref 0.50–1.10)
GFR calc Af Amer: >90 mL/min (ref >90)
GFR calc non Af Amer: >90 mL/min (ref >90)
Glucose, Bld: 158 mg/dL — ABNORMAL HIGH (ref 70–99)
POTASSIUM: 3.7 meq/L (ref 3.7–5.3)
SODIUM: 137 meq/L (ref 137–147)
TOTAL PROTEIN: 7.9 g/dL (ref 6.0–8.3)
Total Bilirubin: 0.3 mg/dL (ref 0.3–1.2)

## 2014-07-08 LAB — I-STAT TROPONIN, ED: Troponin i, poc: 0 ng/mL (ref 0.00–0.08)

## 2014-07-08 LAB — LIPASE, BLOOD: Lipase: 11 U/L (ref 11–59)

## 2014-07-08 MED ORDER — METOCLOPRAMIDE HCL 5 MG/ML IJ SOLN
10.0000 mg | Freq: Once | INTRAMUSCULAR | Status: AC
  Administered 2014-07-08: 10 mg via INTRAVENOUS
  Filled 2014-07-08: qty 2

## 2014-07-08 MED ORDER — ONDANSETRON 4 MG PO TBDP
4.0000 mg | ORAL_TABLET | Freq: Once | ORAL | Status: AC
  Administered 2014-07-08: 4 mg via ORAL
  Filled 2014-07-08: qty 1

## 2014-07-08 MED ORDER — SODIUM CHLORIDE 0.9 % IV SOLN
1000.0000 mL | Freq: Once | INTRAVENOUS | Status: AC
  Administered 2014-07-08: 1000 mL via INTRAVENOUS

## 2014-07-08 MED ORDER — DIPHENHYDRAMINE HCL 50 MG/ML IJ SOLN
25.0000 mg | Freq: Once | INTRAMUSCULAR | Status: AC
  Administered 2014-07-08: 25 mg via INTRAVENOUS
  Filled 2014-07-08: qty 1

## 2014-07-08 MED ORDER — PANTOPRAZOLE SODIUM 40 MG IV SOLR
40.0000 mg | Freq: Once | INTRAVENOUS | Status: AC
  Administered 2014-07-08: 40 mg via INTRAVENOUS
  Filled 2014-07-08: qty 40

## 2014-07-08 MED ORDER — FENTANYL CITRATE 0.05 MG/ML IJ SOLN
50.0000 ug | Freq: Once | INTRAMUSCULAR | Status: AC
  Administered 2014-07-08: 50 ug via INTRAVENOUS

## 2014-07-08 MED ORDER — IOHEXOL 300 MG/ML  SOLN
80.0000 mL | Freq: Once | INTRAMUSCULAR | Status: AC | PRN
  Administered 2014-07-08: 80 mL via INTRAVENOUS

## 2014-07-08 MED ORDER — ONDANSETRON HCL 4 MG/2ML IJ SOLN
4.0000 mg | Freq: Once | INTRAMUSCULAR | Status: AC
  Administered 2014-07-08: 4 mg via INTRAVENOUS
  Filled 2014-07-08: qty 2

## 2014-07-08 MED ORDER — FENTANYL CITRATE 0.05 MG/ML IJ SOLN
INTRAMUSCULAR | Status: AC
  Filled 2014-07-08: qty 2

## 2014-07-08 MED ORDER — MORPHINE SULFATE 4 MG/ML IJ SOLN
4.0000 mg | Freq: Once | INTRAMUSCULAR | Status: AC
  Administered 2014-07-08: 4 mg via INTRAVENOUS
  Filled 2014-07-08: qty 1

## 2014-07-08 MED ORDER — SODIUM CHLORIDE 0.9 % IV SOLN
1000.0000 mL | INTRAVENOUS | Status: DC
  Administered 2014-07-08: 1000 mL via INTRAVENOUS

## 2014-07-08 NOTE — ED Notes (Signed)
Husband comes to the nurses station reporting that she is now throwing up bile and needs a "cocktail"

## 2014-07-08 NOTE — ED Notes (Signed)
Patient states that she is feeling a lot better

## 2014-07-08 NOTE — ED Provider Notes (Signed)
CSN: [id number]     Arrival date & time 07/08/14  1804 History   First MD Initiated Contact with Patient 07/08/14 1946     Chief Complaint  Patient presents with  . Emesis  . Abdominal Pain     (Consider location/radiation/quality/duration/timing/severity/associated sxs/prior Treatment) HPI This patient presents with a chief complaint of vomiting and epigastric pain. The patient has a history of cyclic vomiting. Her symptoms at this time started at about 5:30 in the morning. Symptoms have been going on throughout the day with episodes of throwing up. No diarrhea. The patient reports that she's had a lot of loud sounds coming from her upper abdomen. She reports that the vomitus has been sometimes clear sometimes yellow. There's been no blood present. The patient does have medications at home to try to she has Phenergan, but at this point in time her symptoms have progressed and her epigastric pain worsened.  Past Medical History  Diagnosis Date  . Hiatal hernia   . IBS (irritable bowel syndrome)   . Diverticulitis   . Bipolar affective disorder   . GERD (gastroesophageal reflux disease)    Past Surgical History  Procedure Laterality Date  . Abdominal hysterectomy    . Breast lumpectomy     History reviewed. No pertinent family history. History  Substance Use Topics  . Smoking status: Never Smoker   . Smokeless tobacco: Not on file  . Alcohol Use: No   OB History   Grav Para Term Preterm Abortions TAB SAB Ect Mult Living                 Review of Systems  10 Systems reviewed and are negative for acute change except as noted in the HPI.   Allergies  Review of patient's allergies indicates no known allergies.  Home Medications   Prior to Admission medications   Medication Sig Start Date End Date Taking? Authorizing Provider  aspirin EC 325 MG tablet Take 325 mg by mouth daily.   Yes Historical Provider, MD  cyclobenzaprine (FLEXERIL) 10 MG tablet Take 10 mg by mouth 3  (three) times daily as needed for muscle spasms. For muscle spasms in back   Yes Historical Provider, MD  diclofenac sodium (VOLTAREN) 1 % GEL Apply 1 g topically daily. To back and elbow   Yes Historical Provider, MD  dicyclomine (BENTYL) 20 MG tablet Take 20 mg by mouth 3 (three) times daily as needed. For intestinal spasms   Yes Historical Provider, MD  diphenhydrAMINE (SOMINEX) 25 MG tablet Take 25 mg by mouth 3 (three) times daily as needed for allergies or sleep.   Yes Historical Provider, MD  fluticasone (FLONASE) 50 MCG/ACT nasal spray Place 2 sprays into the nose daily.   Yes Historical Provider, MD  lamoTRIgine (LAMICTAL) 150 MG tablet Take 150 mg by mouth daily.    Yes Historical Provider, MD  Multiple Vitamin (MULITIVITAMIN WITH MINERALS) TABS Take 1 tablet by mouth daily.   Yes Historical Provider, MD  perphenazine (TRILAFON) 4 MG tablet Take 4 mg by mouth 3 (three) times daily.    Yes Historical Provider, MD  pramipexole (MIRAPEX) 0.125 MG tablet Take 0.125 mg by mouth at bedtime.   Yes Historical Provider, MD  prochlorperazine (COMPAZINE) 10 MG tablet Take 1 tablet (10 mg total) by mouth 2 (two) times daily as needed for nausea or vomiting. 04/03/14  Yes Kristen N Ward, DO  promethazine (PHENERGAN) 25 MG tablet Take 1 tablet (25 mg total) by mouth every 6 (  six) hours as needed for nausea or vomiting. 04/03/14  Yes Kristen N Ward, DO  venlafaxine XR (EFFEXOR-XR) 75 MG 24 hr capsule Take 75 mg by mouth daily.   Yes Historical Provider, MD   BP 181/96  Pulse 81  Temp(Src) 97.7 F (36.5 C) (Oral)  Resp 18  SpO2 97% Physical Exam  Constitutional: She is oriented to person, place, and time. She appears well-developed and well-nourished.  The patient is sitting up in the bed she is nontoxic in appearance she is alert. She is expressing epigastric pain. She is not actively vomiting at this point in time. Her color is good.  HENT:  Head: Normocephalic and atraumatic.  Eyes: EOM are normal.  Pupils are equal, round, and reactive to light.  Neck: Neck supple.  Cardiovascular: Normal rate, regular rhythm, normal heart sounds and intact distal pulses.   Pulmonary/Chest: Effort normal and breath sounds normal.  Abdominal: Soft. Bowel sounds are normal. She exhibits no distension. There is tenderness.  The patient has mild to moderate epigastric tenderness however no guarding no palpable mass. Bowel sounds are normal  Musculoskeletal: Normal range of motion. She exhibits no edema.  Neurological: She is alert and oriented to person, place, and time. She has normal strength. Coordination normal. GCS eye subscore is 4. GCS verbal subscore is 5. GCS motor subscore is 6.  Skin: Skin is warm, dry and intact.  Psychiatric:  The patient is mildly anxious.    ED Course  Procedures (including critical care time) Labs Review Labs Reviewed  CBC WITH DIFFERENTIAL - Abnormal; Notable for the following:    WBC 14.0 (*)    Hemoglobin 16.3 (*)    MCH 34.2 (*)    Neutrophils Relative % 87 (*)    Neutro Abs 12.0 (*)    Lymphocytes Relative 11 (*)    Monocytes Relative 2 (*)    All other components within normal limits  COMPREHENSIVE METABOLIC PANEL - Abnormal; Notable for the following:    Glucose, Bld 158 (*)    Creatinine, Ser 0.49 (*)    Alkaline Phosphatase 121 (*)    Anion gap 19 (*)    All other components within normal limits  LIPASE, BLOOD  I-STAT TROPOININ, ED    Imaging Review No results found.   EKG Interpretation None      MDM   Final diagnoses:  Hiatal hernia with gastroesophageal reflux    CT scan does show a large hiatal hernia. This is consistent with the patient's pain pattern. She also has a history of cyclic vomiting. At this point in time based on CT and diagnostic results are does not appear to be in acute complication. The patient is rehydrated and treated for pain and nausea. Suggest OB for further outpatient management and evaluation of treatment  the    Charlesetta Shanks, MD 07/09/14 0022

## 2014-07-08 NOTE — ED Notes (Signed)
Denies pain, nausea resolved, alert, NAD, calm, interactive, pending re-eval, updated.

## 2014-07-08 NOTE — ED Notes (Signed)
Presents with epigastric paiin, nausea and vomiting  Not controlled by phenergan and zofran at home began at 5 AM today. Pt is tearful and restless. Given 4 mg zofran by ems with no relief. HX of cyclic vomiting syndrome. Denies diarrhea.

## 2014-07-09 MED ORDER — PANTOPRAZOLE SODIUM 20 MG PO TBEC: 20.0000 mg | DELAYED_RELEASE_TABLET | Freq: Every day | ORAL | Status: DC

## 2014-07-09 MED ORDER — ONDANSETRON HCL 8 MG PO TABS: 8.0000 mg | ORAL_TABLET | ORAL | Status: DC | PRN

## 2014-07-09 NOTE — Discharge Instructions (Signed)
Hiatal Hernia A hiatal hernia occurs when part of your stomach slides above the muscle that separates your abdomen from your chest (diaphragm). You can be born with a hiatal hernia (congenital), or it may develop over time. In almost all cases of hiatal hernia, only the top part of the stomach pushes through.  Many people have a hiatal hernia with no symptoms. The larger the hernia, the more likely that you will have symptoms. In some cases, a hiatal hernia allows stomach acid to flow back into the tube that carries food from your mouth to your stomach (esophagus). This may cause heartburn symptoms. Severe heartburn symptoms may mean you have developed a condition called gastroesophageal reflux disease (GERD).  CAUSES  Hiatal hernias are caused by a weakness in the opening (hiatus) where your esophagus passes through your diaphragm to attach to the upper part of your stomach. You may be born with a weakness in your hiatus, or a weakness can develop. RISK FACTORS Older age is a major risk factor for a hiatal hernia. Anything that increases pressure on your diaphragm can also increase your risk of a hiatal hernia. This includes:  Pregnancy.  Excess weight.  Frequent constipation. SIGNS AND SYMPTOMS  People with a hiatal hernia often have no symptoms. If symptoms develop, they are almost always caused by GERD. They may include:  Heartburn.  Belching.  Indigestion.  Trouble swallowing.  Coughing or wheezing.  Sore throat.  Hoarseness.  Chest pain. DIAGNOSIS  A hiatal hernia is sometimes found during an exam for another problem. Your health care provider may suspect a hiatal hernia if you have symptoms of GERD. Tests may be done to diagnose GERD. These may include:  X-rays of your stomach or chest.  An upper gastrointestinal (GI) series. This is an X-ray exam of your GI tract involving the use of a chalky liquid that you swallow. The liquid shows up clearly on the X-ray.  Endoscopy.  This is a procedure to look into your stomach using a thin, flexible tube that has a tiny camera and light on the end of it. TREATMENT  If you have no symptoms, you may not need treatment. If you have symptoms, treatment may include:  Dietary and lifestyle changes to help reduce GERD symptoms.  Medicines. These may include:  Over-the-counter antacids.  Medicines that make your stomach empty more quickly.  Medicines that block the production of stomach acid (H2 blockers).  Stronger medicines to reduce stomach acid (proton pump inhibitors).  You may need surgery to repair the hernia if other treatments are not helping. HOME CARE INSTRUCTIONS   Take all medicines as directed by your health care provider.  Quit smoking, if you smoke.  Try to achieve and maintain a healthy body weight.  Eat frequent small meals instead of three large meals a day. This keeps your stomach from getting too full.  Eat slowly.  Do not lie down right after eating.  Do noteat 1-2 hours before bed.   Do not drink beverages with caffeine. These include cola, coffee, cocoa, and tea.  Do not drink alcohol.  Avoid foods that can make symptoms of GERD worse. These may include:  Fatty foods.  Citrus fruits.  Other foods and drinks that contain acid.  Avoid putting pressure on your belly. Anything that puts pressure on your belly increases the amount of acid that may be pushed up into your esophagus.   Avoid bending over, especially after eating.  Raise the head of your bed   by putting blocks under the legs. This keeps your head and esophagus higher than your stomach.  Do not wear tight clothing around your chest or stomach.  Try not to strain when having a bowel movement, when urinating, or when lifting heavy objects. SEEK MEDICAL CARE IF:  Your symptoms are not controlled with medicines or lifestyle changes.  You are having trouble swallowing.  You have coughing or wheezing that will not  go away. SEEK IMMEDIATE MEDICAL CARE IF:  Your pain is getting worse.  Your pain spreads to your arms, neck, jaw, teeth, or back.  You have shortness of breath.  You sweat for no reason.  You feel sick to your stomach (nauseous) or vomit.  You vomit blood.  You have bright red blood in your stools.  You have black, tarry stools.  Document Released: 01/07/2004 Document Revised: 03/03/2014 Document Reviewed: 10/04/2013 [employer] Patient Information 2015 [employer]. This information is not intended to replace advice given to you by your health care provider. Make sure you discuss any questions you have with your health care provider.  

## 2014-07-11 ENCOUNTER — Encounter: Payer: Self-pay | Admitting: Internal Medicine

## 2014-07-28 ENCOUNTER — Encounter (HOSPITAL_COMMUNITY): Payer: Self-pay | Admitting: Emergency Medicine

## 2014-07-28 ENCOUNTER — Emergency Department (HOSPITAL_COMMUNITY)
Admission: EM | Admit: 2014-07-28 | Discharge: 2014-07-28 | Disposition: A | Discharge status: Home / Self Care | Payer: Medicare Other | Attending: Emergency Medicine | Admitting: Emergency Medicine

## 2014-07-28 DIAGNOSIS — R1115 Cyclical vomiting syndrome unrelated to migraine: Secondary | ICD-10-CM | POA: Diagnosis not present

## 2014-07-28 DIAGNOSIS — Z7982 Long term (current) use of aspirin: Secondary | ICD-10-CM | POA: Diagnosis not present

## 2014-07-28 DIAGNOSIS — R197 Diarrhea, unspecified: Secondary | ICD-10-CM | POA: Insufficient documentation

## 2014-07-28 DIAGNOSIS — Z8659 Personal history of other mental and behavioral disorders: Secondary | ICD-10-CM | POA: Insufficient documentation

## 2014-07-28 DIAGNOSIS — Z791 Long term (current) use of non-steroidal anti-inflammatories (NSAID): Secondary | ICD-10-CM | POA: Diagnosis not present

## 2014-07-28 DIAGNOSIS — R112 Nausea with vomiting, unspecified: Secondary | ICD-10-CM | POA: Insufficient documentation

## 2014-07-28 DIAGNOSIS — K219 Gastro-esophageal reflux disease without esophagitis: Secondary | ICD-10-CM | POA: Diagnosis not present

## 2014-07-28 DIAGNOSIS — R1013 Epigastric pain: Secondary | ICD-10-CM | POA: Diagnosis not present

## 2014-07-28 DIAGNOSIS — K589 Irritable bowel syndrome without diarrhea: Secondary | ICD-10-CM | POA: Insufficient documentation

## 2014-07-28 LAB — COMPREHENSIVE METABOLIC PANEL
ALBUMIN: 4.1 g/dL (ref 3.5–5.2)
ALT: 14 U/L (ref 0–35)
AST: 31 U/L (ref 0–37)
Alkaline Phosphatase: 106 U/L (ref 39–117)
Anion gap: 15 (ref 5–15)
BUN: 17 mg/dL (ref 6–23)
CO2: 22 meq/L (ref 19–32)
CREATININE: 0.46 mg/dL — AB (ref 0.50–1.10)
Calcium: 9.2 mg/dL (ref 8.4–10.5)
Chloride: 103 mEq/L (ref 96–112)
GFR calc Af Amer: >90 mL/min (ref >90)
Glucose, Bld: 112 mg/dL — ABNORMAL HIGH (ref 70–99)
POTASSIUM: 4.2 meq/L (ref 3.7–5.3)
Sodium: 140 mEq/L (ref 137–147)
TOTAL PROTEIN: 7.3 g/dL (ref 6.0–8.3)
Total Bilirubin: 0.2 mg/dL — ABNORMAL LOW (ref 0.3–1.2)

## 2014-07-28 LAB — URINALYSIS, ROUTINE W REFLEX MICROSCOPIC
Bilirubin Urine: NEGATIVE
Glucose, UA: NEGATIVE mg/dL
Ketones, ur: NEGATIVE mg/dL
Leukocytes, UA: NEGATIVE
Nitrite: NEGATIVE
PROTEIN: NEGATIVE mg/dL
Specific Gravity, Urine: 1.023 (ref 1.005–1.030)
Urobilinogen, UA: 0.2 mg/dL (ref 0.0–1.0)
pH: 8 (ref 5.0–8.0)

## 2014-07-28 LAB — CBC
HEMATOCRIT: 52 % — AB (ref 36.0–46.0)
HEMOGLOBIN: 18.4 g/dL — AB (ref 12.0–15.0)
MCH: 33.7 pg (ref 26.0–34.0)
MCHC: 35.4 g/dL (ref 30.0–36.0)
MCV: 95.2 fL (ref 78.0–100.0)
Platelets: 216 10*3/uL (ref 150–400)
RBC: 5.46 MIL/uL — AB (ref 3.87–5.11)
RDW: 13.4 % (ref 11.5–15.5)
WBC: 9.7 10*3/uL (ref 4.0–10.5)

## 2014-07-28 LAB — LIPASE, BLOOD: LIPASE: 16 U/L (ref 11–59)

## 2014-07-28 LAB — URINE MICROSCOPIC-ADD ON

## 2014-07-28 MED ORDER — PROCHLORPERAZINE MALEATE 10 MG PO TABS: 10.0000 mg | ORAL_TABLET | Freq: Two times a day (BID) | ORAL | Status: DC | PRN

## 2014-07-28 MED ORDER — MORPHINE SULFATE 4 MG/ML IJ SOLN
4.0000 mg | Freq: Once | INTRAMUSCULAR | Status: AC
  Administered 2014-07-28: 4 mg via INTRAVENOUS
  Filled 2014-07-28: qty 1

## 2014-07-28 MED ORDER — METOCLOPRAMIDE HCL 5 MG/ML IJ SOLN
10.0000 mg | Freq: Once | INTRAMUSCULAR | Status: AC
  Administered 2014-07-28: 10 mg via INTRAVENOUS
  Filled 2014-07-28: qty 2

## 2014-07-28 MED ORDER — SODIUM CHLORIDE 0.9 % IV BOLUS (SEPSIS)
1000.0000 mL | Freq: Once | INTRAVENOUS | Status: AC
  Administered 2014-07-28: 1000 mL via INTRAVENOUS

## 2014-07-28 MED ORDER — ONDANSETRON HCL 4 MG/2ML IJ SOLN
4.0000 mg | Freq: Once | INTRAMUSCULAR | Status: AC
  Administered 2014-07-28: 4 mg via INTRAVENOUS
  Filled 2014-07-28: qty 2

## 2014-07-28 MED ORDER — HYDROMORPHONE HCL 1 MG/ML IJ SOLN
1.0000 mg | Freq: Once | INTRAMUSCULAR | Status: AC
  Administered 2014-07-28: 1 mg via INTRAVENOUS
  Filled 2014-07-28: qty 1

## 2014-07-28 NOTE — Discharge Instructions (Signed)
Cyclic Vomiting Syndrome °Cyclic vomiting syndrome is a benign condition in which patients experience bouts or cycles of severe nausea and vomiting that last for hours or even days. The bouts of nausea and vomiting alternate with longer periods of no symptoms and generally good health. Cyclic vomiting syndrome occurs mostly in children, but can affect adults. °CAUSES  °[facility] has no known cause. Each episode is typically similar to the previous ones. The episodes tend to:  °· Start at about the same time of day. °· Last the same length of time. °· Present the same symptoms at the same level of intensity. °Cyclic vomiting syndrome can begin at any age in children and adults. Cyclic vomiting syndrome usually starts between the ages of 3 and 7 years. In adults, episodes tend to occur less often than they do in children, but they last longer. Furthermore, the events or situations that trigger episodes in adults cannot always be pinpointed as easily as they can in children. °There are 4 phases of cyclic vomiting syndrome: °1. Prodrome. The prodrome phase signals that an episode of nausea and vomiting is about to begin. This phase can last from just a few minutes to several hours. This phase is often marked by belly (abdominal) pain. Sometimes taking medicine early in the prodrome phase can stop an episode in progress. However, sometimes there is no warning. A person may simply wake up in the middle of the night or early morning and begin vomiting. °2. Episode. The episode phase consists of: °· Severe vomiting. °· Nausea. °· Gagging (retching). °3. Recovery. The recovery phase begins when the nausea and vomiting stop. Healthy color, appetite, and energy return. °4. Symptom-free interval. The symptom-free interval phase is the period between episodes when no symptoms are present. °TRIGGERS °Episodes can be triggered by an infection or event. Examples of triggers include: °· Infections. °· Colds, allergies, sinus problems, and  the flu. °· Eating certain foods such as chocolate or cheese. °· Foods with monosodium glutamate (MSG) or preservatives. °· Fast foods. °· Pre-packaged foods. °· Foods with low nutritional value (junk foods). °· Overeating. °· Eating just before going to bed. °· Hot weather. °· Dehydration. °· Not enough sleep or poor sleep quality. °· Physical exhaustion. °· Menstruation. °· Motion sickness. °· Emotional stress (school or home difficulties). °· Excitement or stress. °SYMPTOMS  °The main symptoms of cyclic vomiting syndrome are: °· Severe vomiting. °· Nausea. °· Gagging (retching). °Episodes usually begin at night or the first thing in the morning. Episodes may include vomiting or retching up to 5 or 6 times an hour during the worst of the episode. Episodes usually last anywhere from 1 to 4 days. Episodes can last for up to 10 days. Other symptoms include: °· Paleness. °· Exhaustion. °· Listlessness. °· Abdominal pain. °· Loose stools or diarrhea. °Sometimes the nausea and vomiting are so severe that a person appears to be almost unconscious. Sensitivity to light, headache, fever, dizziness, may also accompany an episode. In addition, the vomiting may cause drooling and excessive thirst. Drinking water usually leads to more vomiting, though the water can dilute the acid in the vomit, making the episode a little less painful. Continuous vomiting can lead to dehydration, which means that the body has lost excessive water and salts. °DIAGNOSIS  °Cyclic vomiting syndrome is hard to diagnose because there are no clear tests to identify it. A caregiver must diagnose cyclic vomiting syndrome by looking at symptoms and medical history. A caregiver must exclude more common diseases   or disorders that can also cause nausea and vomiting. Also, diagnosis takes time because caregivers need to identify a pattern or cycle to the vomiting. °TREATMENT  °Cyclic vomiting syndrome cannot be cured. Treatment varies, but people with  cyclic vomiting syndrome should get plenty of rest and sleep and take medications that prevent, stop, or lessen the vomiting episodes and other symptoms. °People whose episodes are frequent and long-lasting may be treated during the symptom-free intervals in an effort to prevent or ease future episodes. The symptom-free phase is a good time to eliminate anything known to trigger an episode. For example, if episodes are brought on by stress or excitement, this period is the time to find ways to reduce stress and stay calm. If sinus problems or allergies cause episodes, those conditions should be treated. The triggers listed above should be avoided or prevented. °Because of the similarities between migraine and cyclic vomiting syndrome, caregivers treat some people with severe cyclic vomiting syndrome with drugs that are also used for migraine headaches. The drugs are designed to: °· Prevent episodes. °· Reduce their frequency. °· Lessen their severity. °HOME CARE INSTRUCTIONS °Once a vomiting episode begins, treatment is supportive. It helps to stay in bed and sleep in a dark, quiet room. Severe nausea and vomiting may require hospitalization and intravenous (IV) fluids to prevent dehydration. Relaxing medications (sedatives) may help if the nausea continues. Sometimes, during the prodrome phase, it is possible to stop an episode from happening altogether. Only take over-the-counter or prescription medicines for pain, discomfort or fever as directed by your caregiver. Do not give aspirin to children. °During the recovery phase, drinking water and replacing lost electrolytes (salts in the blood) are very important. Electrolytes are salts that the body needs to function well and stay healthy. Symptoms during the recovery phase can vary. Some people find that their appetites return to normal immediately, while others need to begin by drinking clear liquids and then move slowly to solid food. °RELATED COMPLICATIONS °The  severe vomiting that defines cyclic vomiting syndrome is a risk factor for several complications: °· Dehydration--Vomiting causes the body to lose water quickly. °· Electrolyte imbalance--Vomiting also causes the body to lose the important salts it needs to keep working properly. °· Peptic esophagitis--The tube that connects the mouth to the stomach (esophagus) becomes injured from the stomach acid that comes up with the vomit. °· Hematemesis--The esophagus becomes irritated and bleeds, so blood mixes with the vomit. °· Mallory-Weiss tear--The lower end of the esophagus may tear open or the stomach may bruise from vomiting or retching. °· Tooth decay--The acid in the vomit can hurt the teeth by corroding the tooth enamel. °SEEK MEDICAL CARE IF: °You have questions or problems. °Document Released: 12/26/2001 Document Revised: 01/09/2012 Document Reviewed: 01/24/2011 °[employer]® Patient Information ©2015 [employer]. This information is not intended to replace advice given to you by your health care provider. Make sure you discuss any questions you have with your health care provider. ° °

## 2014-07-28 NOTE — ED Notes (Signed)
Pt states that she has an appt nov 10 with gastroenterologist.

## 2014-07-28 NOTE — ED Provider Notes (Signed)
CSN: [id number]     Arrival date & time 07/28/14  0630 History   First MD Initiated Contact with Patient 07/28/14 [phone]     Chief Complaint  Patient presents with  . Nausea  . Emesis     (Consider location/radiation/quality/duration/timing/severity/associated sxs/prior Treatment) Patient is a 58 y.o. female presenting with vomiting. The history is provided by the patient.  Emesis Severity:  Moderate Timing:  Constant Quality:  Bilious material and stomach contents Progression:  Improving Chronicity:  Recurrent Recent urination:  Normal Relieved by:  Antiemetics Worsened by:  Nothing tried Associated symptoms: abdominal pain and diarrhea   Associated symptoms: no chills, no cough, no fever, no headaches, no sore throat and no URI     Past Medical History  Diagnosis Date  . Hiatal hernia   . IBS (irritable bowel syndrome)   . Diverticulitis   . Bipolar affective disorder   . GERD (gastroesophageal reflux disease)    Past Surgical History  Procedure Laterality Date  . Abdominal hysterectomy    . Breast lumpectomy     History reviewed. No pertinent family history. History  Substance Use Topics  . Smoking status: Never Smoker   . Smokeless tobacco: Not on file  . Alcohol Use: No   OB History   Grav Para Term Preterm Abortions TAB SAB Ect Mult Living                 Review of Systems  Constitutional: Negative for fever and chills.  HENT: Negative for sore throat.   Respiratory: Negative for cough and shortness of breath.   Cardiovascular: Negative for chest pain and leg swelling.  Gastrointestinal: Positive for nausea, vomiting, abdominal pain and diarrhea.  Neurological: Negative for headaches.  All other systems reviewed and are negative.     Allergies  Review of patient's allergies indicates no known allergies.  Home Medications   Prior to Admission medications   Medication Sig Start Date End Date Taking? Authorizing Provider  aspirin EC 325 MG tablet  Take 325 mg by mouth daily.   Yes Historical Provider, MD  cyclobenzaprine (FLEXERIL) 10 MG tablet Take 10 mg by mouth 3 (three) times daily as needed for muscle spasms. For muscle spasms in back   Yes Historical Provider, MD  diclofenac sodium (VOLTAREN) 1 % GEL Apply 1 g topically daily. To back and elbow   Yes Historical Provider, MD  dicyclomine (BENTYL) 20 MG tablet Take 20 mg by mouth 3 (three) times daily as needed. For intestinal spasms   Yes Historical Provider, MD  diphenhydrAMINE (SOMINEX) 25 MG tablet Take 25 mg by mouth 3 (three) times daily as needed for allergies or sleep.   Yes Historical Provider, MD  fluticasone (FLONASE) 50 MCG/ACT nasal spray Place 2 sprays into the nose daily.   Yes Historical Provider, MD  lamoTRIgine (LAMICTAL) 150 MG tablet Take 150 mg by mouth daily.    Yes Historical Provider, MD  Multiple Vitamin (MULITIVITAMIN WITH MINERALS) TABS Take 1 tablet by mouth daily.   Yes Historical Provider, MD  pantoprazole (PROTONIX) 20 MG tablet Take 1 tablet (20 mg total) by mouth daily. 07/09/14  Yes Charlesetta Shanks, MD  perphenazine (TRILAFON) 4 MG tablet Take 4 mg by mouth 3 (three) times daily.    Yes Historical Provider, MD  pramipexole (MIRAPEX) 0.125 MG tablet Take 0.125 mg by mouth at bedtime.   Yes Historical Provider, MD  promethazine (PHENERGAN) 25 MG tablet Take 1 tablet (25 mg total) by mouth every 6 (  six) hours as needed for nausea or vomiting. 04/03/14  Yes Kristen N Ward, DO  venlafaxine XR (EFFEXOR-XR) 75 MG 24 hr capsule Take 75 mg by mouth daily.   Yes Historical Provider, MD   BP 119/87  Pulse 100  Temp(Src) 98.2 F (36.8 C) (Oral)  Resp 18  Ht 5\' 6"  (1.676 m)  Wt 165 lb (74.844 kg)  BMI 26.64 kg/m2  SpO2 97% Physical Exam  Nursing note and vitals reviewed. Constitutional: She is oriented to person, place, and time. She appears well-developed and well-nourished. No distress.  HENT:  Head: Normocephalic and atraumatic.  Mouth/Throat: Oropharynx is  clear and moist.  Eyes: EOM are normal. Pupils are equal, round, and reactive to light.  Neck: Normal range of motion. Neck supple.  Cardiovascular: Normal rate and regular rhythm.  Exam reveals no friction rub.   Pulmonary/Chest: Effort normal and breath sounds normal. No respiratory distress. She has no wheezes. She has no rales.  Abdominal: Soft. She exhibits no distension. There is tenderness (epigastric). There is no rebound.  Musculoskeletal: Normal range of motion. She exhibits no edema.  Neurological: She is alert and oriented to person, place, and time.  Skin: No rash noted. She is not diaphoretic.    ED Course  Procedures (including critical care time) Labs Review Labs Reviewed  CBC  COMPREHENSIVE METABOLIC PANEL  URINALYSIS, ROUTINE W REFLEX MICROSCOPIC  LIPASE, BLOOD    Imaging Review No results found.   EKG Interpretation None      MDM   Final diagnoses:  Non-intractable cyclical vomiting with nausea    58 year old female here with acute onset of nausea, vomiting, diarrhea since around 2 AM, roughly 5-1/2 hours ago. She has taken 2 doses of Phenergan which seemed to be helping. She has history of multiple similar presentations. She says is usually caused by her hiatal hernia. Review of old records show chronic hiatal hernia. No fever, chills, shortness of breath, chest pain. No dysuria. Has some epigastric pain with this, which normally accompanies her nausea and vomiting. Head CT scan done for similar presentation 3 weeks ago that was normal aside from her large esophageal hiatal hernia. Here vitals are stable. No active vomiting. She states she is feeling better will hopefully like to go home. We'll give fluids, check labs, give Reglan and morphine. Mild epigastric pain on exam without rebound or guarding. No other abdominal pain. No peritoneal signs. With recent CT done for same thing, no need for CT imaging today. Feeling better after meds. Stable for  discharge.  Evelina Bucy, MD 07/28/14 1026

## 2014-07-28 NOTE — ED Notes (Signed)
Pt c/o n/v/d since 0200. Pt has taken phenergan and bentyl without relief. Pt states she has a large hiatal hernia that maybe causing her n/v.

## 2014-07-29 ENCOUNTER — Encounter (HOSPITAL_COMMUNITY): Payer: Self-pay | Admitting: Emergency Medicine

## 2014-07-29 ENCOUNTER — Emergency Department (HOSPITAL_COMMUNITY)
Admission: EM | Admit: 2014-07-29 | Discharge: 2014-07-29 | Disposition: A | Discharge status: Home / Self Care | Payer: Medicare Other | Attending: Emergency Medicine | Admitting: Emergency Medicine

## 2014-07-29 ENCOUNTER — Emergency Department (HOSPITAL_COMMUNITY): Payer: Medicare Other

## 2014-07-29 DIAGNOSIS — Z791 Long term (current) use of non-steroidal anti-inflammatories (NSAID): Secondary | ICD-10-CM | POA: Diagnosis not present

## 2014-07-29 DIAGNOSIS — IMO0002 Reserved for concepts with insufficient information to code with codable children: Secondary | ICD-10-CM | POA: Diagnosis not present

## 2014-07-29 DIAGNOSIS — R1013 Epigastric pain: Secondary | ICD-10-CM | POA: Diagnosis not present

## 2014-07-29 DIAGNOSIS — K589 Irritable bowel syndrome without diarrhea: Secondary | ICD-10-CM | POA: Insufficient documentation

## 2014-07-29 DIAGNOSIS — F319 Bipolar disorder, unspecified: Secondary | ICD-10-CM | POA: Insufficient documentation

## 2014-07-29 DIAGNOSIS — Z90710 Acquired absence of both cervix and uterus: Secondary | ICD-10-CM | POA: Insufficient documentation

## 2014-07-29 DIAGNOSIS — K219 Gastro-esophageal reflux disease without esophagitis: Secondary | ICD-10-CM | POA: Diagnosis not present

## 2014-07-29 DIAGNOSIS — Z7982 Long term (current) use of aspirin: Secondary | ICD-10-CM | POA: Insufficient documentation

## 2014-07-29 DIAGNOSIS — R109 Unspecified abdominal pain: Secondary | ICD-10-CM

## 2014-07-29 LAB — COMPREHENSIVE METABOLIC PANEL
ALT: 14 U/L (ref 0–35)
ANION GAP: 16 — AB (ref 5–15)
AST: 20 U/L (ref 0–37)
Albumin: 4.3 g/dL (ref 3.5–5.2)
Alkaline Phosphatase: 105 U/L (ref 39–117)
BILIRUBIN TOTAL: 0.3 mg/dL (ref 0.3–1.2)
BUN: 14 mg/dL (ref 6–23)
CALCIUM: 9.4 mg/dL (ref 8.4–10.5)
CHLORIDE: 105 meq/L (ref 96–112)
CO2: 21 meq/L (ref 19–32)
CREATININE: 0.56 mg/dL (ref 0.50–1.10)
GLUCOSE: 125 mg/dL — AB (ref 70–99)
Potassium: 3.8 mEq/L (ref 3.7–5.3)
Sodium: 142 mEq/L (ref 137–147)
Total Protein: 7.7 g/dL (ref 6.0–8.3)

## 2014-07-29 LAB — CBC WITH DIFFERENTIAL/PLATELET
Basophils Absolute: 0 10*3/uL (ref 0.0–0.1)
Basophils Relative: 0 % (ref 0–1)
Eosinophils Absolute: 0.1 10*3/uL (ref 0.0–0.7)
Eosinophils Relative: 1 % (ref 0–5)
HEMATOCRIT: 42.8 % (ref 36.0–46.0)
HEMOGLOBIN: 15 g/dL (ref 12.0–15.0)
LYMPHS PCT: 18 % (ref 12–46)
Lymphs Abs: 3 10*3/uL (ref 0.7–4.0)
MCH: 33.7 pg (ref 26.0–34.0)
MCHC: 35 g/dL (ref 30.0–36.0)
MCV: 96.2 fL (ref 78.0–100.0)
MONO ABS: 0.7 10*3/uL (ref 0.1–1.0)
MONOS PCT: 4 % (ref 3–12)
NEUTROS ABS: 12.5 10*3/uL — AB (ref 1.7–7.7)
Neutrophils Relative %: 77 % (ref 43–77)
Platelets: 329 10*3/uL (ref 150–400)
RBC: 4.45 MIL/uL (ref 3.87–5.11)
RDW: 13.4 % (ref 11.5–15.5)
WBC: 16.3 10*3/uL — AB (ref 4.0–10.5)

## 2014-07-29 LAB — LIPASE, BLOOD: LIPASE: 15 U/L (ref 11–59)

## 2014-07-29 MED ORDER — LORAZEPAM 2 MG/ML IJ SOLN
0.5000 mg | Freq: Once | INTRAMUSCULAR | Status: AC
  Administered 2014-07-29: 0.5 mg via INTRAVENOUS
  Filled 2014-07-29: qty 1

## 2014-07-29 MED ORDER — METOCLOPRAMIDE HCL 5 MG/ML IJ SOLN
10.0000 mg | Freq: Once | INTRAMUSCULAR | Status: AC
  Administered 2014-07-29: 10 mg via INTRAVENOUS
  Filled 2014-07-29: qty 2

## 2014-07-29 MED ORDER — SODIUM CHLORIDE 0.9 % IV BOLUS (SEPSIS)
1000.0000 mL | Freq: Once | INTRAVENOUS | Status: AC
  Administered 2014-07-29: 1000 mL via INTRAVENOUS

## 2014-07-29 MED ORDER — ONDANSETRON 8 MG PO TBDP
8.0000 mg | ORAL_TABLET | Freq: Once | ORAL | Status: AC
  Administered 2014-07-29: 8 mg via ORAL
  Filled 2014-07-29: qty 1

## 2014-07-29 MED ORDER — HYDROMORPHONE HCL 1 MG/ML IJ SOLN
1.0000 mg | Freq: Once | INTRAMUSCULAR | Status: AC
  Administered 2014-07-29: 1 mg via INTRAVENOUS
  Filled 2014-07-29: qty 1

## 2014-07-29 MED ORDER — ONDANSETRON HCL 4 MG/2ML IJ SOLN
4.0000 mg | Freq: Once | INTRAMUSCULAR | Status: AC
  Administered 2014-07-29: 4 mg via INTRAVENOUS
  Filled 2014-07-29: qty 2

## 2014-07-29 NOTE — ED Notes (Signed)
2 Unsuccessful IV attempts 

## 2014-07-29 NOTE — ED Notes (Signed)
146/92BP, 108HR, 18Resp

## 2014-07-29 NOTE — ED Notes (Signed)
Patient presents from home via [facility] for "hernia pain" x1 day. Patient reports sharp epigastric pain with N/V.

## 2014-07-29 NOTE — ED Notes (Signed)
Patient reports "very high hiatal hernia". Seen at [facility] yesterday for same. Pain started around 11pm last night with N/V/D. Pain rated 10/10.  Patient is A&O x4, speaking in complete sentences.

## 2014-07-29 NOTE — ED Notes (Signed)
Bed: EN27 Expected date:  Expected time:  Means of arrival:  Comments: EMS 58yo M epigastric pain / vomiting/ seen at [facility] for same thing yesterday

## 2014-07-30 ENCOUNTER — Emergency Department (HOSPITAL_COMMUNITY)
Admission: EM | Admit: 2014-07-30 | Discharge: 2014-07-30 | Disposition: A | Discharge status: Home / Self Care | Payer: Medicare Other | Attending: Emergency Medicine | Admitting: Emergency Medicine

## 2014-07-30 ENCOUNTER — Encounter (HOSPITAL_COMMUNITY): Payer: Self-pay | Admitting: Emergency Medicine

## 2014-07-30 ENCOUNTER — Emergency Department (HOSPITAL_COMMUNITY): Payer: Medicare Other

## 2014-07-30 DIAGNOSIS — Z791 Long term (current) use of non-steroidal anti-inflammatories (NSAID): Secondary | ICD-10-CM | POA: Insufficient documentation

## 2014-07-30 DIAGNOSIS — Z7982 Long term (current) use of aspirin: Secondary | ICD-10-CM | POA: Diagnosis not present

## 2014-07-30 DIAGNOSIS — Z79899 Other long term (current) drug therapy: Secondary | ICD-10-CM | POA: Insufficient documentation

## 2014-07-30 DIAGNOSIS — Z90710 Acquired absence of both cervix and uterus: Secondary | ICD-10-CM | POA: Diagnosis not present

## 2014-07-30 DIAGNOSIS — K219 Gastro-esophageal reflux disease without esophagitis: Secondary | ICD-10-CM | POA: Diagnosis not present

## 2014-07-30 DIAGNOSIS — R1011 Right upper quadrant pain: Secondary | ICD-10-CM | POA: Insufficient documentation

## 2014-07-30 DIAGNOSIS — R112 Nausea with vomiting, unspecified: Secondary | ICD-10-CM | POA: Diagnosis not present

## 2014-07-30 DIAGNOSIS — IMO0002 Reserved for concepts with insufficient information to code with codable children: Secondary | ICD-10-CM | POA: Insufficient documentation

## 2014-07-30 DIAGNOSIS — K589 Irritable bowel syndrome without diarrhea: Secondary | ICD-10-CM | POA: Diagnosis not present

## 2014-07-30 DIAGNOSIS — F319 Bipolar disorder, unspecified: Secondary | ICD-10-CM | POA: Insufficient documentation

## 2014-07-30 DIAGNOSIS — R1013 Epigastric pain: Secondary | ICD-10-CM | POA: Diagnosis not present

## 2014-07-30 DIAGNOSIS — K449 Diaphragmatic hernia without obstruction or gangrene: Secondary | ICD-10-CM | POA: Insufficient documentation

## 2014-07-30 LAB — CBC WITH DIFFERENTIAL/PLATELET
BASOS PCT: 0 % (ref 0–1)
Basophils Absolute: 0.1 10*3/uL (ref 0.0–0.1)
Eosinophils Absolute: 0.1 10*3/uL (ref 0.0–0.7)
Eosinophils Relative: 0 % (ref 0–5)
HCT: 46.7 % — ABNORMAL HIGH (ref 36.0–46.0)
Hemoglobin: 16.7 g/dL — ABNORMAL HIGH (ref 12.0–15.0)
LYMPHS PCT: 22 % (ref 12–46)
Lymphs Abs: 3.6 10*3/uL (ref 0.7–4.0)
MCH: 34.4 pg — ABNORMAL HIGH (ref 26.0–34.0)
MCHC: 35.8 g/dL (ref 30.0–36.0)
MCV: 96.1 fL (ref 78.0–100.0)
MONO ABS: 0.8 10*3/uL (ref 0.1–1.0)
Monocytes Relative: 5 % (ref 3–12)
NEUTROS ABS: 11.7 10*3/uL — AB (ref 1.7–7.7)
Neutrophils Relative %: 73 % (ref 43–77)
PLATELETS: 355 10*3/uL (ref 150–400)
RBC: 4.86 MIL/uL (ref 3.87–5.11)
RDW: 13.2 % (ref 11.5–15.5)
WBC: 16.2 10*3/uL — AB (ref 4.0–10.5)

## 2014-07-30 LAB — COMPREHENSIVE METABOLIC PANEL
ALBUMIN: 4.6 g/dL (ref 3.5–5.2)
ALT: 22 U/L (ref 0–35)
AST: 25 U/L (ref 0–37)
Alkaline Phosphatase: 108 U/L (ref 39–117)
Anion gap: 17 — ABNORMAL HIGH (ref 5–15)
BUN: 15 mg/dL (ref 6–23)
CALCIUM: 9.9 mg/dL (ref 8.4–10.5)
CO2: 22 mEq/L (ref 19–32)
CREATININE: 0.59 mg/dL (ref 0.50–1.10)
Chloride: 99 mEq/L (ref 96–112)
GFR calc Af Amer: >90 mL/min (ref >90)
GFR calc non Af Amer: >90 mL/min (ref >90)
Glucose, Bld: 106 mg/dL — ABNORMAL HIGH (ref 70–99)
Potassium: 3.5 mEq/L — ABNORMAL LOW (ref 3.7–5.3)
SODIUM: 138 meq/L (ref 137–147)
Total Bilirubin: 0.4 mg/dL (ref 0.3–1.2)
Total Protein: 8.4 g/dL — ABNORMAL HIGH (ref 6.0–8.3)

## 2014-07-30 LAB — URINALYSIS, ROUTINE W REFLEX MICROSCOPIC
GLUCOSE, UA: NEGATIVE mg/dL
Ketones, ur: NEGATIVE mg/dL
NITRITE: NEGATIVE
PH: 6 (ref 5.0–8.0)
Protein, ur: >300 mg/dL — AB
SPECIFIC GRAVITY, URINE: 1.035 — AB (ref 1.005–1.030)
Urobilinogen, UA: 1 mg/dL (ref 0.0–1.0)

## 2014-07-30 LAB — URINE MICROSCOPIC-ADD ON

## 2014-07-30 LAB — LIPASE, BLOOD: Lipase: 15 U/L (ref 11–59)

## 2014-07-30 MED ORDER — PROMETHAZINE HCL 25 MG/ML IJ SOLN
25.0000 mg | Freq: Once | INTRAMUSCULAR | Status: AC
  Administered 2014-07-30: 25 mg via INTRAVENOUS
  Filled 2014-07-30: qty 1

## 2014-07-30 MED ORDER — SODIUM CHLORIDE 0.9 % IV BOLUS (SEPSIS)
1000.0000 mL | Freq: Once | INTRAVENOUS | Status: AC
  Administered 2014-07-30: 1000 mL via INTRAVENOUS

## 2014-07-30 MED ORDER — ONDANSETRON HCL 4 MG/2ML IJ SOLN
4.0000 mg | Freq: Once | INTRAMUSCULAR | Status: AC
  Administered 2014-07-30: 4 mg via INTRAVENOUS
  Filled 2014-07-30: qty 2

## 2014-07-30 MED ORDER — HYDROCODONE-ACETAMINOPHEN 5-325 MG PO TABS: 1.0000 | ORAL_TABLET | Freq: Four times a day (QID) | ORAL | Status: DC | PRN

## 2014-07-30 MED ORDER — MORPHINE SULFATE 4 MG/ML IJ SOLN
4.0000 mg | Freq: Once | INTRAMUSCULAR | Status: AC
  Administered 2014-07-30: 4 mg via INTRAVENOUS
  Filled 2014-07-30: qty 1

## 2014-07-30 MED ORDER — PANTOPRAZOLE SODIUM 40 MG IV SOLR
40.0000 mg | Freq: Once | INTRAVENOUS | Status: AC
  Administered 2014-07-30: 40 mg via INTRAVENOUS
  Filled 2014-07-30: qty 40

## 2014-07-30 NOTE — ED Notes (Signed)
Fluids offered to patient. Will re-evaluate promptly.

## 2014-07-30 NOTE — ED Notes (Signed)
Pt here 3 times this week.  C/o central abd pain with "dry heaving".  Denies diarrhea.

## 2014-07-30 NOTE — ED Notes (Signed)
Patient tolerated fluids with no vomiting. Reports "it hurt my stomach though." Denies wanting to attempt eating crackers.

## 2014-07-30 NOTE — ED Provider Notes (Signed)
CSN: [id number]     Arrival date & time 07/30/14  1419 History   First MD Initiated Contact with Patient 07/30/14 1654     Chief Complaint  Patient presents with  . Abdominal Pain     (Consider location/radiation/quality/duration/timing/severity/associated sxs/prior Treatment) HPI Comments: Patient with a history of a large hiatal hernia presents today with a chief complaint of epigastric abdominal pain, nausea, and vomiting.  She reports that her symptoms have been present for the past 3 days.  She states that she has taken Phenergan suppository, Phenergan tablet, and Protonix for her symptoms without improvement.  This is her third visit to the ED for the same.  She was seen in the ED on 07/08/14 and had a CT abdomen/pelvis done at that time, which showed a large hiatal hernia, but was otherwise negative.  She was seen again in the ED two days ago for these symptoms, and had a negative abdominal xray done at that time.  She states that she has an appointment with GI scheduled 09-09-14.  She denies any prior history of Gallbladder disease.  She denies fever, chills, cough, chest pain, SOB, diarrhea, or urinary symptoms.  No blood in her emesis.    The history is provided by the patient.    Past Medical History  Diagnosis Date  . Hiatal hernia   . IBS (irritable bowel syndrome)   . Diverticulitis   . Bipolar affective disorder   . GERD (gastroesophageal reflux disease)    Past Surgical History  Procedure Laterality Date  . Abdominal hysterectomy    . Breast lumpectomy     No family history on file. History  Substance Use Topics  . Smoking status: Never Smoker   . Smokeless tobacco: Not on file  . Alcohol Use: No   OB History   Grav Para Term Preterm Abortions TAB SAB Ect Mult Living                 Review of Systems  All other systems reviewed and are negative.     Allergies  Review of patient's allergies indicates no known allergies.  Home Medications   Prior to  Admission medications   Medication Sig Start Date End Date Taking? Authorizing Provider  aspirin EC 325 MG tablet Take 325 mg by mouth daily.   Yes Historical Provider, MD  diclofenac sodium (VOLTAREN) 1 % GEL Apply 1 g topically daily. To back and elbow   Yes Historical Provider, MD  dicyclomine (BENTYL) 20 MG tablet Take 20 mg by mouth 3 (three) times daily as needed. For intestinal spasms   Yes Historical Provider, MD  diphenhydrAMINE (SOMINEX) 25 MG tablet Take 25 mg by mouth 3 (three) times daily as needed for allergies or sleep.   Yes Historical Provider, MD  fluticasone (FLONASE) 50 MCG/ACT nasal spray Place 2 sprays into the nose daily.   Yes Historical Provider, MD  lamoTRIgine (LAMICTAL) 150 MG tablet Take 150 mg by mouth daily.    Yes Historical Provider, MD  Multiple Vitamin (MULITIVITAMIN WITH MINERALS) TABS Take 1 tablet by mouth daily.   Yes Historical Provider, MD  pantoprazole (PROTONIX) 20 MG tablet Take 1 tablet (20 mg total) by mouth daily. 07/09/14  Yes Charlesetta Shanks, MD  perphenazine (TRILAFON) 4 MG tablet Take 4 mg by mouth 3 (three) times daily.    Yes Historical Provider, MD  pramipexole (MIRAPEX) 0.125 MG tablet Take 0.125 mg by mouth at bedtime.   Yes Historical Provider, MD  promethazine (PHENERGAN)  25 MG suppository Place 25 mg rectally every 6 (six) hours as needed for nausea or vomiting.   Yes Historical Provider, MD  promethazine (PHENERGAN) 25 MG tablet Take 1 tablet (25 mg total) by mouth every 6 (six) hours as needed for nausea or vomiting. 04/03/14  Yes Kristen N Ward, DO  venlafaxine XR (EFFEXOR-XR) 75 MG 24 hr capsule Take 75 mg by mouth daily.   Yes Historical Provider, MD  cyclobenzaprine (FLEXERIL) 10 MG tablet Take 10 mg by mouth 3 (three) times daily as needed for muscle spasms. For muscle spasms in back    Historical Provider, MD  prochlorperazine (COMPAZINE) 10 MG tablet Take 1 tablet (10 mg total) by mouth 2 (two) times daily as needed for nausea or  vomiting. 07/28/14   Evelina Bucy, MD   BP 151/93  Pulse 104  Temp(Src) 98.3 F (36.8 C) (Oral)  Resp 17  SpO2 100% Physical Exam  Nursing note and vitals reviewed. Constitutional: She appears well-developed and well-nourished. No distress.  HENT:  Head: Normocephalic and atraumatic.  Mouth/Throat: Oropharynx is clear and moist.  Neck: Normal range of motion. Neck supple.  Cardiovascular: Normal rate, regular rhythm and normal heart sounds.   Pulmonary/Chest: Effort normal and breath sounds normal.  Abdominal: Soft. Bowel sounds are normal. She exhibits no distension and no mass. There is tenderness in the right upper quadrant and epigastric area. There is no rebound and no guarding.  Musculoskeletal: Normal range of motion.  Neurological: She is alert.  Skin: Skin is warm and dry. She is not diaphoretic.  Psychiatric: She has a normal mood and affect.    ED Course  Procedures (including critical care time) Labs Review Labs Reviewed  CBC WITH DIFFERENTIAL - Abnormal; Notable for the following:    WBC 16.2 (*)    Hemoglobin 16.7 (*)    HCT 46.7 (*)    MCH 34.4 (*)    Neutro Abs 11.7 (*)    All other components within normal limits  COMPREHENSIVE METABOLIC PANEL - Abnormal; Notable for the following:    Potassium 3.5 (*)    Glucose, Bld 106 (*)    Total Protein 8.4 (*)    Anion gap 17 (*)    All other components within normal limits  LIPASE, BLOOD  URINALYSIS, ROUTINE W REFLEX MICROSCOPIC    Imaging Review US Abdomen Complete  07/30/2014   CLINICAL DATA:  Epigastric abdominal pain.  EXAM: ULTRASOUND ABDOMEN COMPLETE  COMPARISON:  CT scan 07/08/2014  FINDINGS: Gallbladder:  No gallstones or wall thickening visualized. No sonographic Murphy sign noted.  Common bile duct:  Diameter: 4.0 mm  Liver:  Diffuse fatty infiltration is again demonstrated. Small hepatic cysts are noted. No worrisome lesions or biliary dilatation.  IVC:  Normal caliber.  Pancreas:  Sonographically  unremarkable.  Spleen:  Normal size and echogenicity without focal lesions.  Right Kidney:  Length: 12.1 cm. Normal renal cortical thickness and echogenicity without focal lesions or hydronephrosis.  Left Kidney:  Length: 9.8 cm. Normal renal cortical thickness and echogenicity without focal lesions or hydronephrosis.  Abdominal aorta:  Normal caliber.  Other findings:  None.  IMPRESSION: Unremarkable abdominal ultrasound examination except for diffuse fatty infiltration of the liver and small hepatic cysts.   Electronically Signed   By: Kalman Jewels M.D.   On: 07/30/2014 18:54   Dg Abd Acute W/chest  07/29/2014   CLINICAL DATA:  Severe mid upper abdominal pain, nausea, and vomiting.  EXAM: ACUTE ABDOMEN SERIES (ABDOMEN 2 VIEW &  CHEST 1 VIEW)  COMPARISON:  04/03/2014  FINDINGS: Mild pulmonary hyperinflation. Esophageal hiatal hernia behind the heart. Normal heart size and pulmonary vascularity. No focal airspace disease or consolidation in the lungs. No blunting of costophrenic angles. No pneumothorax. Mediastinal contours appear intact.  Scattered gas and stool in the colon. No small or large bowel distention. No free intra-abdominal air. No abnormal air-fluid levels. No radiopaque stones. Visualized bones appear intact.  IMPRESSION: No evidence of active pulmonary disease. Esophageal hiatal hernia behind the heart. Nonobstructing bowel gas pattern.   Electronically Signed   By: Lucienne Capers M.D.   On: 07/29/2014 05:25     EKG Interpretation None     8:45 PM Patient tolerating PO liquids. MDM   Final diagnoses:  None   Patient presents today with a chief complaint of epigastric abdominal pain, nausea, and vomiting.  She has numerous ED visits for the same.  This is her third visit for the same pain in the past month.  She has had a CT abdomen pelvis, Acute Abdominal Series, and an Abdominal Ultrasound done for this same pain.  Imaging showing large hiatal hernia.  Ultrasound today did not  show anything acute.  Labs today unremarkable.  Patient able to tolerate PO liquids.  No vomiting during ED course.  Patient recommended follow up with GI.  Feel that the patient is stable for discharge.  Patient discussed with Dr. Tomi Bamberger who is in agreement with the plan.    Hyman Bible, PA-C 07/31/14 [phone]

## 2014-07-31 NOTE — ED Provider Notes (Signed)
Medical screening examination/treatment/procedure(s) were performed by non-physician practitioner and as supervising physician I was immediately available for consultation/collaboration.    Dorie Rank, MD 07/31/14 [phone]

## 2014-08-06 NOTE — ED Provider Notes (Signed)
CSN: [id number]     Arrival date & time 07/29/14  0315 History   First MD Initiated Contact with Patient 07/29/14 [phone]     Chief Complaint  Patient presents with  . Abdominal Pain      HPI Patient presents with nausea vomiting and diarrhea over the past 24 hours with increasing epigastric pain.  Seen yesterday at [facility] for same thing.  Patient does have a history of hiatal hernia.  She describes the pain as sharp.  Denies chest pain or shortness of breath.  No hematemesis.  Denies lower abdominal pain.  No urinary complaints   Past Medical History  Diagnosis Date  . Hiatal hernia   . IBS (irritable bowel syndrome)   . Diverticulitis   . Bipolar affective disorder   . GERD (gastroesophageal reflux disease)    Past Surgical History  Procedure Laterality Date  . Abdominal hysterectomy    . Breast lumpectomy     No family history on file. History  Substance Use Topics  . Smoking status: Never Smoker   . Smokeless tobacco: Not on file  . Alcohol Use: No   OB History   Grav Para Term Preterm Abortions TAB SAB Ect Mult Living                 Review of Systems  All other systems reviewed and are negative.     Allergies  Review of patient's allergies indicates no known allergies.  Home Medications   Prior to Admission medications   Medication Sig Start Date End Date Taking? Authorizing Provider  aspirin EC 325 MG tablet Take 325 mg by mouth daily.   Yes Historical Provider, MD  cyclobenzaprine (FLEXERIL) 10 MG tablet Take 10 mg by mouth 3 (three) times daily as needed for muscle spasms. For muscle spasms in back   Yes Historical Provider, MD  diclofenac sodium (VOLTAREN) 1 % GEL Apply 1 g topically daily. To back and elbow   Yes Historical Provider, MD  dicyclomine (BENTYL) 20 MG tablet Take 20 mg by mouth 3 (three) times daily as needed. For intestinal spasms   Yes Historical Provider, MD  diphenhydrAMINE (SOMINEX) 25 MG tablet Take 25 mg by mouth 3 (three) times  daily as needed for allergies or sleep.   Yes Historical Provider, MD  fluticasone (FLONASE) 50 MCG/ACT nasal spray Place 2 sprays into the nose daily.   Yes Historical Provider, MD  lamoTRIgine (LAMICTAL) 150 MG tablet Take 150 mg by mouth daily.    Yes Historical Provider, MD  Multiple Vitamin (MULITIVITAMIN WITH MINERALS) TABS Take 1 tablet by mouth daily.   Yes Historical Provider, MD  pantoprazole (PROTONIX) 20 MG tablet Take 1 tablet (20 mg total) by mouth daily. 07/09/14  Yes Charlesetta Shanks, MD  perphenazine (TRILAFON) 4 MG tablet Take 4 mg by mouth 3 (three) times daily.    Yes Historical Provider, MD  pramipexole (MIRAPEX) 0.125 MG tablet Take 0.125 mg by mouth at bedtime.   Yes Historical Provider, MD  prochlorperazine (COMPAZINE) 10 MG tablet Take 1 tablet (10 mg total) by mouth 2 (two) times daily as needed for nausea or vomiting. 07/28/14  Yes Evelina Bucy, MD  promethazine (PHENERGAN) 25 MG suppository Place 25 mg rectally every 6 (six) hours as needed for nausea or vomiting.   Yes Historical Provider, MD  promethazine (PHENERGAN) 25 MG tablet Take 1 tablet (25 mg total) by mouth every 6 (six) hours as needed for nausea or vomiting. 04/03/14  Yes Cyril Mourning  N Ward, DO  venlafaxine XR (EFFEXOR-XR) 75 MG 24 hr capsule Take 75 mg by mouth daily.   Yes Historical Provider, MD  HYDROcodone-acetaminophen (NORCO/VICODIN) 5-325 MG per tablet Take 1-2 tablets by mouth every 6 (six) hours as needed. 07/30/14   Heather Laisure, PA-C   BP 151/86  Pulse 95  Temp(Src) 98.3 F (36.8 C) (Oral)  Resp 18  SpO2 98% Physical Exam  Nursing note and vitals reviewed. Constitutional: She is oriented to person, place, and time. She appears well-developed and well-nourished. No distress.  HENT:  Head: Normocephalic and atraumatic.  Eyes: EOM are normal.  Neck: Normal range of motion.  Cardiovascular: Normal rate, regular rhythm and normal heart sounds.   Pulmonary/Chest: Effort normal and breath sounds normal.   Abdominal: Soft. She exhibits no distension. There is no tenderness.  Musculoskeletal: Normal range of motion.  Neurological: She is alert and oriented to person, place, and time.  Skin: Skin is warm and dry.  Psychiatric: She has a normal mood and affect. Judgment normal.    ED Course  Procedures (including critical care time) Labs Review Labs Reviewed  CBC WITH DIFFERENTIAL - Abnormal; Notable for the following:    WBC 16.3 (*)    Neutro Abs 12.5 (*)    All other components within normal limits  COMPREHENSIVE METABOLIC PANEL - Abnormal; Notable for the following:    Glucose, Bld 125 (*)    Anion gap 16 (*)    All other components within normal limits  LIPASE, BLOOD    Imaging Review No results found.   EKG Interpretation None      MDM   Final diagnoses:  Abdominal pain, unspecified abdominal location    Patient was managed in emergency Department symptomatically.  She's feeling better at this time.  Home with PCP followup.  Recommended followup with general surgery.    Hoy Morn, MD 08/06/14 [phone]

## 2014-08-13 ENCOUNTER — Emergency Department (HOSPITAL_COMMUNITY)
Admission: EM | Admit: 2014-08-13 | Discharge: 2014-08-13 | Disposition: A | Discharge status: Home / Self Care | Payer: Medicare Other | Attending: Emergency Medicine | Admitting: Emergency Medicine

## 2014-08-13 ENCOUNTER — Encounter (HOSPITAL_COMMUNITY): Payer: Self-pay | Admitting: Emergency Medicine

## 2014-08-13 DIAGNOSIS — Z79899 Other long term (current) drug therapy: Secondary | ICD-10-CM | POA: Diagnosis not present

## 2014-08-13 DIAGNOSIS — R1013 Epigastric pain: Secondary | ICD-10-CM | POA: Diagnosis present

## 2014-08-13 DIAGNOSIS — G8929 Other chronic pain: Secondary | ICD-10-CM | POA: Diagnosis not present

## 2014-08-13 DIAGNOSIS — K449 Diaphragmatic hernia without obstruction or gangrene: Secondary | ICD-10-CM | POA: Diagnosis not present

## 2014-08-13 DIAGNOSIS — F319 Bipolar disorder, unspecified: Secondary | ICD-10-CM | POA: Insufficient documentation

## 2014-08-13 DIAGNOSIS — Z7982 Long term (current) use of aspirin: Secondary | ICD-10-CM | POA: Insufficient documentation

## 2014-08-13 DIAGNOSIS — K589 Irritable bowel syndrome without diarrhea: Secondary | ICD-10-CM | POA: Diagnosis not present

## 2014-08-13 DIAGNOSIS — K219 Gastro-esophageal reflux disease without esophagitis: Secondary | ICD-10-CM | POA: Diagnosis not present

## 2014-08-13 DIAGNOSIS — N390 Urinary tract infection, site not specified: Secondary | ICD-10-CM

## 2014-08-13 DIAGNOSIS — Z7951 Long term (current) use of inhaled steroids: Secondary | ICD-10-CM | POA: Insufficient documentation

## 2014-08-13 DIAGNOSIS — G43A0 Cyclical vomiting, not intractable: Secondary | ICD-10-CM | POA: Insufficient documentation

## 2014-08-13 DIAGNOSIS — R1115 Cyclical vomiting syndrome unrelated to migraine: Secondary | ICD-10-CM

## 2014-08-13 LAB — LIPASE, BLOOD: Lipase: 13 U/L (ref 11–59)

## 2014-08-13 LAB — URINALYSIS, ROUTINE W REFLEX MICROSCOPIC
BILIRUBIN URINE: NEGATIVE
Glucose, UA: NEGATIVE mg/dL
Ketones, ur: NEGATIVE mg/dL
Nitrite: NEGATIVE
PROTEIN: 30 mg/dL — AB
Specific Gravity, Urine: 1.022 (ref 1.005–1.030)
UROBILINOGEN UA: 0.2 mg/dL (ref 0.0–1.0)
pH: 7 (ref 5.0–8.0)

## 2014-08-13 LAB — CBC WITH DIFFERENTIAL/PLATELET
Basophils Absolute: 0 10*3/uL (ref 0.0–0.1)
Basophils Relative: 0 % (ref 0–1)
EOS ABS: 0 10*3/uL (ref 0.0–0.7)
Eosinophils Relative: 0 % (ref 0–5)
HEMATOCRIT: 44.9 % (ref 36.0–46.0)
HEMOGLOBIN: 15.7 g/dL — AB (ref 12.0–15.0)
LYMPHS ABS: 1.4 10*3/uL (ref 0.7–4.0)
Lymphocytes Relative: 9 % — ABNORMAL LOW (ref 12–46)
MCH: 34.1 pg — AB (ref 26.0–34.0)
MCHC: 35 g/dL (ref 30.0–36.0)
MCV: 97.6 fL (ref 78.0–100.0)
MONO ABS: 0.4 10*3/uL (ref 0.1–1.0)
MONOS PCT: 3 % (ref 3–12)
NEUTROS ABS: 14 10*3/uL — AB (ref 1.7–7.7)
NEUTROS PCT: 88 % — AB (ref 43–77)
Platelets: 328 10*3/uL (ref 150–400)
RBC: 4.6 MIL/uL (ref 3.87–5.11)
RDW: 13.3 % (ref 11.5–15.5)
WBC: 15.9 10*3/uL — ABNORMAL HIGH (ref 4.0–10.5)

## 2014-08-13 LAB — COMPREHENSIVE METABOLIC PANEL
ALT: 11 U/L (ref 0–35)
ANION GAP: 17 — AB (ref 5–15)
AST: 16 U/L (ref 0–37)
Albumin: 4.3 g/dL (ref 3.5–5.2)
Alkaline Phosphatase: 123 U/L — ABNORMAL HIGH (ref 39–117)
BILIRUBIN TOTAL: 0.2 mg/dL — AB (ref 0.3–1.2)
BUN: 14 mg/dL (ref 6–23)
CHLORIDE: 103 meq/L (ref 96–112)
CO2: 23 mEq/L (ref 19–32)
Calcium: 9.6 mg/dL (ref 8.4–10.5)
Creatinine, Ser: 0.58 mg/dL (ref 0.50–1.10)
GFR calc non Af Amer: >90 mL/min (ref >90)
GLUCOSE: 122 mg/dL — AB (ref 70–99)
Potassium: 4.1 mEq/L (ref 3.7–5.3)
Sodium: 143 mEq/L (ref 137–147)
Total Protein: 8.1 g/dL (ref 6.0–8.3)

## 2014-08-13 LAB — URINE MICROSCOPIC-ADD ON

## 2014-08-13 MED ORDER — ONDANSETRON 8 MG PO TBDP
8.0000 mg | ORAL_TABLET | Freq: Once | ORAL | Status: AC
  Administered 2014-08-13: 8 mg via ORAL
  Filled 2014-08-13: qty 1

## 2014-08-13 MED ORDER — SODIUM CHLORIDE 0.9 % IV BOLUS (SEPSIS)
1000.0000 mL | Freq: Once | INTRAVENOUS | Status: AC
  Administered 2014-08-13: 1000 mL via INTRAVENOUS

## 2014-08-13 MED ORDER — CEFTRIAXONE SODIUM 1 G IJ SOLR
1.0000 g | Freq: Once | INTRAMUSCULAR | Status: AC
  Administered 2014-08-13: 1 g via INTRAVENOUS
  Filled 2014-08-13: qty 10

## 2014-08-13 MED ORDER — PROCHLORPERAZINE EDISYLATE 5 MG/ML IJ SOLN
10.0000 mg | Freq: Once | INTRAMUSCULAR | Status: AC
  Administered 2014-08-13: 10 mg via INTRAVENOUS
  Filled 2014-08-13: qty 2

## 2014-08-13 MED ORDER — HYDROCODONE-ACETAMINOPHEN 5-325 MG PO TABS: 1.0000 | ORAL_TABLET | ORAL | Status: DC | PRN

## 2014-08-13 MED ORDER — CEPHALEXIN 500 MG PO CAPS: 500.0000 mg | ORAL_CAPSULE | Freq: Two times a day (BID) | ORAL | Status: DC

## 2014-08-13 MED ORDER — MORPHINE SULFATE 4 MG/ML IJ SOLN
4.0000 mg | Freq: Once | INTRAMUSCULAR | Status: AC
  Administered 2014-08-13: 4 mg via INTRAVENOUS
  Filled 2014-08-13: qty 1

## 2014-08-13 NOTE — ED Notes (Signed)
Sprite given 

## 2014-08-13 NOTE — ED Notes (Signed)
Pt up and ambulated to restroom, urine sample given.

## 2014-08-13 NOTE — ED Notes (Signed)
Per EMS pt has hx of hiatal hernia, today she c/o epigastric pain, nausea and vomiting.

## 2014-08-13 NOTE — ED Provider Notes (Signed)
TIME SEEN: 1:15 PM  CHIEF COMPLAINT: epigastric pain, vomiting  HPI: Pt is a 58 y.o. F with h/o cyclic vomiting, hiatal hernia, GERD who presents to the ED with vomiting that started earlier today.  Pt is also complaining of sharp epigastric pain that is trypically associated with her vomiting.  No fever, chest pain, SOB, diarrhea, hematochezia or melena.  No radiation of pain.  Pain worse with vomiting.  Pt took phenergan PR and PO and compazine PO prior to arrival with som relief.  No sick contacts or recent travel.  Pt had endoscopy in 2003 and her PCP is trying to get her in to see Dr. Collene Mares with GI per her report.  She has follow up scheduled with [facility] on Nov 10th for her hiatal hernia.  She states she has had cyclic vomiting over 15 years but it has been getting progressively worse and this is her 3rd ED visit in 1 week.  She is s/p hysterectomy.  ROS: See HPI Constitutional: no fever  Eyes: no drainage  ENT: no runny nose   Cardiovascular:  no chest pain  Resp: no SOB  GI:  vomiting GU: no dysuria Integumentary: no rash  Allergy: no hives  Musculoskeletal: no leg swelling  Neurological: no slurred speech ROS otherwise negative  PAST MEDICAL HISTORY/PAST SURGICAL HISTORY:  Past Medical History  Diagnosis Date  . Hiatal hernia   . IBS (irritable bowel syndrome)   . Diverticulitis   . Bipolar affective disorder   . GERD (gastroesophageal reflux disease)     MEDICATIONS:  Prior to Admission medications   Medication Sig Start Date End Date Taking? Authorizing Provider  aspirin EC 325 MG tablet Take 325 mg by mouth daily.   Yes Historical Provider, MD  cyclobenzaprine (FLEXERIL) 10 MG tablet Take 10 mg by mouth 3 (three) times daily as needed for muscle spasms. For muscle spasms in back   Yes Historical Provider, MD  diclofenac sodium (VOLTAREN) 1 % GEL Apply 1 g topically daily. To back and elbow   Yes Historical Provider, MD  dicyclomine (BENTYL) 20 MG tablet Take 20 mg by mouth  3 (three) times daily as needed. For intestinal spasms   Yes Historical Provider, MD  diphenhydrAMINE (SOMINEX) 25 MG tablet Take 25 mg by mouth 3 (three) times daily as needed for allergies or sleep.   Yes Historical Provider, MD  fluticasone (FLONASE) 50 MCG/ACT nasal spray Place 2 sprays into the nose daily.   Yes Historical Provider, MD  lamoTRIgine (LAMICTAL) 150 MG tablet Take 150 mg by mouth daily.    Yes Historical Provider, MD  Multiple Vitamin (MULITIVITAMIN WITH MINERALS) TABS Take 1 tablet by mouth daily.   Yes Historical Provider, MD  pantoprazole (PROTONIX) 20 MG tablet Take 1 tablet (20 mg total) by mouth daily. 07/09/14  Yes Charlesetta Shanks, MD  perphenazine (TRILAFON) 4 MG tablet Take 4 mg by mouth 3 (three) times daily.    Yes Historical Provider, MD  pramipexole (MIRAPEX) 0.125 MG tablet Take 0.125 mg by mouth at bedtime.   Yes Historical Provider, MD  prochlorperazine (COMPAZINE) 10 MG tablet Take 1 tablet (10 mg total) by mouth 2 (two) times daily as needed for nausea or vomiting. 07/28/14  Yes Evelina Bucy, MD  promethazine (PHENERGAN) 25 MG suppository Place 25 mg rectally every 6 (six) hours as needed for nausea or vomiting.   Yes Historical Provider, MD  promethazine (PHENERGAN) 25 MG tablet Take 1 tablet (25 mg total) by mouth every 6 (  six) hours as needed for nausea or vomiting. 04/03/14  Yes Kristen N Ward, DO  venlafaxine XR (EFFEXOR-XR) 75 MG 24 hr capsule Take 75 mg by mouth daily.   Yes Historical Provider, MD    ALLERGIES:  No Known Allergies  SOCIAL HISTORY:  History  Substance Use Topics  . Smoking status: Never Smoker   . Smokeless tobacco: Not on file  . Alcohol Use: No    FAMILY HISTORY: No family history on file.  EXAM: BP 118/80  Pulse 95  Resp 19  SpO2 96% CONSTITUTIONAL: Alert and oriented and responds appropriately to questions. Well-appearing; well-nourished; appears uncomfortable but nontoxic HEAD: Normocephalic EYES: Conjunctivae clear,  PERRL ENT: normal nose; no rhinorrhea; dry mucous membranes; pharynx without lesions noted NECK: Supple, no meningismus, no LAD  CARD: RRR; S1 and S2 appreciated; no murmurs, no clicks, no rubs, no gallops RESP: Normal chest excursion without splinting or tachypnea; breath sounds clear and equal bilaterally; no wheezes, no rhonchi, no rales,  ABD/GI: Normal bowel sounds; non-distended; soft, mildly TTP in epigastric region, negative Murphy's sign, no TTP at McBurney's, no rebound, no guarding BACK:  The back appears normal and is non-tender to palpation, there is no CVA tenderness EXT: Normal ROM in all joints; non-tender to palpation; no edema; normal capillary refill; no cyanosis    SKIN: Normal color for age and race; warm NEURO: Moves all extremities equally PSYCH: The patient's mood and manner are appropriate. Grooming and personal hygiene are appropriate.  MEDICAL DECISION MAKING: Pt here with cyclic vomiting and chronic epigastric pain.  CT Sept 2015 showed large hiatal hernia.  Will check labs, urine.  Will give IVF, morphine, compazine and reassess.  I do not feel she needs repeat imaging today given her h/o chronic sx and relatively benign exam.    ED PROGRESS: Labs shows leukocytosis which is chronic.  UA shows mild UTI.  Culture pending.  No flank pain on exam.  Pt feels much better after meds and is tolerating po.  Will give IV Ceftriaxone.  Pt feels ready for dc home.  Has phenergan and compazine at home.  Has outpt f/u scheduled.  Discussed return precautions.  She verbalized understanding and is comfortable with plan.       [place], DO 08/13/14 2135

## 2014-08-13 NOTE — Discharge Instructions (Signed)
Cyclic Vomiting Syndrome °Cyclic vomiting syndrome is a benign condition in which patients experience bouts or cycles of severe nausea and vomiting that last for hours or even days. The bouts of nausea and vomiting alternate with longer periods of no symptoms and generally good health. Cyclic vomiting syndrome occurs mostly in children, but can affect adults. °CAUSES  °CVS has no known cause. Each episode is typically similar to the previous ones. The episodes tend to:  °· Start at about the same time of day. °· Last the same length of time. °· Present the same symptoms at the same level of intensity. °Cyclic vomiting syndrome can begin at any age in children and adults. Cyclic vomiting syndrome usually starts between the ages of 3 and 7 years. In adults, episodes tend to occur less often than they do in children, but they last longer. Furthermore, the events or situations that trigger episodes in adults cannot always be pinpointed as easily as they can in children. °There are 4 phases of cyclic vomiting syndrome: °1. Prodrome. The prodrome phase signals that an episode of nausea and vomiting is about to begin. This phase can last from just a few minutes to several hours. This phase is often marked by belly (abdominal) pain. Sometimes taking medicine early in the prodrome phase can stop an episode in progress. However, sometimes there is no warning. A person may simply wake up in the middle of the night or early morning and begin vomiting. °2. Episode. The episode phase consists of: °· Severe vomiting. °· Nausea. °· Gagging (retching). °3. Recovery. The recovery phase begins when the nausea and vomiting stop. Healthy color, appetite, and energy return. °4. Symptom-free interval. The symptom-free interval phase is the period between episodes when no symptoms are present. °TRIGGERS °Episodes can be triggered by an infection or event. Examples of triggers include: °· Infections. °· Colds, allergies, sinus problems, and  the flu. °· Eating certain foods such as chocolate or cheese. °· Foods with monosodium glutamate (MSG) or preservatives. °· Fast foods. °· Pre-packaged foods. °· Foods with low nutritional value (junk foods). °· Overeating. °· Eating just before going to bed. °· Hot weather. °· Dehydration. °· Not enough sleep or poor sleep quality. °· Physical exhaustion. °· Menstruation. °· Motion sickness. °· Emotional stress (school or home difficulties). °· Excitement or stress. °SYMPTOMS  °The main symptoms of cyclic vomiting syndrome are: °· Severe vomiting. °· Nausea. °· Gagging (retching). °Episodes usually begin at night or the first thing in the morning. Episodes may include vomiting or retching up to 5 or 6 times an hour during the worst of the episode. Episodes usually last anywhere from 1 to 4 days. Episodes can last for up to 10 days. Other symptoms include: °· Paleness. °· Exhaustion. °· Listlessness. °· Abdominal pain. °· Loose stools or diarrhea. °Sometimes the nausea and vomiting are so severe that a person appears to be almost unconscious. Sensitivity to light, headache, fever, dizziness, may also accompany an episode. In addition, the vomiting may cause drooling and excessive thirst. Drinking water usually leads to more vomiting, though the water can dilute the acid in the vomit, making the episode a little less painful. Continuous vomiting can lead to dehydration, which means that the body has lost excessive water and salts. °DIAGNOSIS  °Cyclic vomiting syndrome is hard to diagnose because there are no clear tests to identify it. A caregiver must diagnose cyclic vomiting syndrome by looking at symptoms and medical history. A caregiver must exclude more common diseases   or disorders that can also cause nausea and vomiting. Also, diagnosis takes time because caregivers need to identify a pattern or cycle to the vomiting. TREATMENT  Cyclic vomiting syndrome cannot be cured. Treatment varies, but people with  cyclic vomiting syndrome should get plenty of rest and sleep and take medications that prevent, stop, or lessen the vomiting episodes and other symptoms. People whose episodes are frequent and long-lasting may be treated during the symptom-free intervals in an effort to prevent or ease future episodes. The symptom-free phase is a good time to eliminate anything known to trigger an episode. For example, if episodes are brought on by stress or excitement, this period is the time to find ways to reduce stress and stay calm. If sinus problems or allergies cause episodes, those conditions should be treated. The triggers listed above should be avoided or prevented. Because of the similarities between migraine and cyclic vomiting syndrome, caregivers treat some people with severe cyclic vomiting syndrome with drugs that are also used for migraine headaches. The drugs are designed to:  Prevent episodes.  Reduce their frequency.  Lessen their severity. HOME CARE INSTRUCTIONS Once a vomiting episode begins, treatment is supportive. It helps to stay in bed and sleep in a dark, quiet room. Severe nausea and vomiting may require hospitalization and intravenous (IV) fluids to prevent dehydration. Relaxing medications (sedatives) may help if the nausea continues. Sometimes, during the prodrome phase, it is possible to stop an episode from happening altogether. Only take over-the-counter or prescription medicines for pain, discomfort or fever as directed by your caregiver. Do not give aspirin to children. During the recovery phase, drinking water and replacing lost electrolytes (salts in the blood) are very important. Electrolytes are salts that the body needs to function well and stay healthy. Symptoms during the recovery phase can vary. Some people find that their appetites return to normal immediately, while others need to begin by drinking clear liquids and then move slowly to solid food. RELATED COMPLICATIONS The  severe vomiting that defines cyclic vomiting syndrome is a risk factor for several complications:  Dehydration--Vomiting causes the body to lose water quickly.  Electrolyte imbalance--Vomiting also causes the body to lose the important salts it needs to keep working properly.  Peptic esophagitis--The tube that connects the mouth to the stomach (esophagus) becomes injured from the stomach acid that comes up with the vomit.  Hematemesis--The esophagus becomes irritated and bleeds, so blood mixes with the vomit.  Mallory-Weiss tear--The lower end of the esophagus may tear open or the stomach may bruise from vomiting or retching.  Tooth decay--The acid in the vomit can hurt the teeth by corroding the tooth enamel. SEEK MEDICAL CARE IF: You have questions or problems. Document Released: 12/26/2001 Document Revised: 01/09/2012 Document Reviewed: 01/24/2011 [facility] Patient Information 2015 [place], [place]. This information is not intended to replace advice given to you by your health care provider. Make sure you discuss any questions you have with your health care provider.  Urinary Tract Infection Urinary tract infections (UTIs) can develop anywhere along your urinary tract. Your urinary tract is your body's drainage system for removing wastes and extra water. Your urinary tract includes two kidneys, two ureters, a bladder, and a urethra. Your kidneys are a pair of bean-shaped organs. Each kidney is about the size of your fist. They are located below your ribs, one on each side of your spine. CAUSES Infections are caused by microbes, which are microscopic organisms, including fungi, viruses, and bacteria. These organisms are so  small that they can only be seen through a microscope. Bacteria are the microbes that most commonly cause UTIs. SYMPTOMS  Symptoms of UTIs may vary by age and gender of the patient and by the location of the infection. Symptoms in young women typically include a frequent and  intense urge to urinate and a painful, burning feeling in the bladder or urethra during urination. Older women and men are more likely to be tired, shaky, and weak and have muscle aches and abdominal pain. A fever may mean the infection is in your kidneys. Other symptoms of a kidney infection include pain in your back or sides below the ribs, nausea, and vomiting. DIAGNOSIS To diagnose a UTI, your caregiver will ask you about your symptoms. Your caregiver also will ask to provide a urine sample. The urine sample will be tested for bacteria and white blood cells. White blood cells are made by your body to help fight infection. TREATMENT  Typically, UTIs can be treated with medication. Because most UTIs are caused by a bacterial infection, they usually can be treated with the use of antibiotics. The choice of antibiotic and length of treatment depend on your symptoms and the type of bacteria causing your infection. HOME CARE INSTRUCTIONS  If you were prescribed antibiotics, take them exactly as your caregiver instructs you. Finish the medication even if you feel better after you have only taken some of the medication.  Drink enough water and fluids to keep your urine clear or pale yellow.  Avoid caffeine, tea, and carbonated beverages. They tend to irritate your bladder.  Empty your bladder often. Avoid holding urine for long periods of time.  Empty your bladder before and after sexual intercourse.  After a bowel movement, women should cleanse from front to back. Use each tissue only once. SEEK MEDICAL CARE IF:   You have back pain.  You develop a fever.  Your symptoms do not begin to resolve within 3 days. SEEK IMMEDIATE MEDICAL CARE IF:   You have severe back pain or lower abdominal pain.  You develop chills.  You have nausea or vomiting.  You have continued burning or discomfort with urination. MAKE SURE YOU:   Understand these instructions.  Will watch your condition.  Will  get help right away if you are not doing well or get worse. Document Released: 07/27/2005 Document Revised: 04/17/2012 Document Reviewed: 11/25/2011 [facility] Patient Information 2015 [place], [place]. This information is not intended to replace advice given to you by your health care provider. Make sure you discuss any questions you have with your health care provider.  Hiatal Hernia A hiatal hernia occurs when part of your stomach slides above the muscle that separates your abdomen from your chest (diaphragm). You can be born with a hiatal hernia (congenital), or it may develop over time. In almost all cases of hiatal hernia, only the top part of the stomach pushes through.  Many people have a hiatal hernia with no symptoms. The larger the hernia, the more likely that you will have symptoms. In some cases, a hiatal hernia allows stomach acid to flow back into the tube that carries food from your mouth to your stomach (esophagus). This may cause heartburn symptoms. Severe heartburn symptoms may mean you have developed a condition called gastroesophageal reflux disease (GERD).  CAUSES  Hiatal hernias are caused by a weakness in the opening (hiatus) where your esophagus passes through your diaphragm to attach to the upper part of your stomach. You may be born  with a weakness in your hiatus, or a weakness can develop. RISK FACTORS Older age is a major risk factor for a hiatal hernia. Anything that increases pressure on your diaphragm can also increase your risk of a hiatal hernia. This includes:  Pregnancy.  Excess weight.  Frequent constipation. SIGNS AND SYMPTOMS  People with a hiatal hernia often have no symptoms. If symptoms develop, they are almost always caused by GERD. They may include:  Heartburn.  Belching.  Indigestion.  Trouble swallowing.  Coughing or wheezing.  Sore throat.  Hoarseness.  Chest pain. DIAGNOSIS  A hiatal hernia is sometimes found during an exam for another  problem. Your health care provider may suspect a hiatal hernia if you have symptoms of GERD. Tests may be done to diagnose GERD. These may include:  X-rays of your stomach or chest.  An upper gastrointestinal (GI) series. This is an X-ray exam of your GI tract involving the use of a chalky liquid that you swallow. The liquid shows up clearly on the X-ray.  Endoscopy. This is a procedure to look into your stomach using a thin, flexible tube that has a tiny camera and light on the end of it. TREATMENT  If you have no symptoms, you may not need treatment. If you have symptoms, treatment may include:  Dietary and lifestyle changes to help reduce GERD symptoms.  Medicines. These may include:  Over-the-counter antacids.  Medicines that make your stomach empty more quickly.  Medicines that block the production of stomach acid (H2 blockers).  Stronger medicines to reduce stomach acid (proton pump inhibitors).  You may need surgery to repair the hernia if other treatments are not helping. HOME CARE INSTRUCTIONS   Take all medicines as directed by your health care provider.  Quit smoking, if you smoke.  Try to achieve and maintain a healthy body weight.  Eat frequent small meals instead of three large meals a day. This keeps your stomach from getting too full.  Eat slowly.  Do not lie down right after eating.  Do noteat 1-2 hours before bed.   Do not drink beverages with caffeine. These include cola, coffee, cocoa, and tea.  Do not drink alcohol.  Avoid foods that can make symptoms of GERD worse. These may include:  Fatty foods.  Citrus fruits.  Other foods and drinks that contain acid.  Avoid putting pressure on your belly. Anything that puts pressure on your belly increases the amount of acid that may be pushed up into your esophagus.   Avoid bending over, especially after eating.  Raise the head of your bed by putting blocks under the legs. This keeps your head and  esophagus higher than your stomach.  Do not wear tight clothing around your chest or stomach.  Try not to strain when having a bowel movement, when urinating, or when lifting heavy objects. SEEK MEDICAL CARE IF:  Your symptoms are not controlled with medicines or lifestyle changes.  You are having trouble swallowing.  You have coughing or wheezing that will not go away. SEEK IMMEDIATE MEDICAL CARE IF:  Your pain is getting worse.  Your pain spreads to your arms, neck, jaw, teeth, or back.  You have shortness of breath.  You sweat for no reason.  You feel sick to your stomach (nauseous) or vomit.  You vomit blood.  You have bright red blood in your stools.  You have black, tarry stools.  Document Released: 01/07/2004 Document Revised: 03/03/2014 Document Reviewed: 10/04/2013 [employer] Patient Information 2015  [employer]. This information is not intended to replace advice given to you by your health care provider. Make sure you discuss any questions you have with your health care provider.

## 2014-08-15 LAB — URINE CULTURE: Colony Count: 15000

## 2014-08-31 HISTORY — PX: NISSEN FUNDOPLICATION: SHX2091

## 2014-09-09 ENCOUNTER — Ambulatory Visit (INDEPENDENT_AMBULATORY_CARE_PROVIDER_SITE_OTHER): Payer: Medicare Other | Admitting: Internal Medicine

## 2014-09-09 ENCOUNTER — Encounter: Payer: Self-pay | Admitting: Internal Medicine

## 2014-09-09 VITALS — BP 110/70 | HR 96 | Ht 66.0 in | Wt 159.1 lb

## 2014-09-09 DIAGNOSIS — R935 Abnormal findings on diagnostic imaging of other abdominal regions, including retroperitoneum: Secondary | ICD-10-CM

## 2014-09-09 DIAGNOSIS — R1013 Epigastric pain: Secondary | ICD-10-CM

## 2014-09-09 DIAGNOSIS — K449 Diaphragmatic hernia without obstruction or gangrene: Secondary | ICD-10-CM

## 2014-09-09 DIAGNOSIS — R112 Nausea with vomiting, unspecified: Secondary | ICD-10-CM

## 2014-09-09 DIAGNOSIS — K219 Gastro-esophageal reflux disease without esophagitis: Secondary | ICD-10-CM

## 2014-09-09 DIAGNOSIS — I639 Cerebral infarction, unspecified: Secondary | ICD-10-CM

## 2014-09-09 DIAGNOSIS — K589 Irritable bowel syndrome without diarrhea: Secondary | ICD-10-CM

## 2014-09-09 NOTE — Patient Instructions (Signed)
You have been scheduled for an upper GI series at [facility] (1st floor of hospital) on 09/12/2014 at 10:30am. Please arrive 15 minutes prior to your appointment for registration. Make certain not to have anything to eat or drink after midnight to your appointment. Should you need to reschedule your appointment, please contact radiology at [phone]. This test typically takes about 30 minutes to perform.  You have been scheduled for an endoscopy. Please follow written instructions given to you at your visit today. If you use inhalers (even only as needed), please bring them with you on the day of your procedure. Your physician has requested that you go to www.[email or web address].com and enter the access code given to you at your visit today. This web site gives a general overview about your procedure. However, you should still follow specific instructions given to you by our office regarding your preparation for the procedure.   Continue taking your Protonix.

## 2014-09-09 NOTE — Progress Notes (Signed)
HISTORY OF PRESENT ILLNESS:  [name] is a 58 y.o. female with a history of bipolar disorder, chronic headaches, and prior stroke. She is referred today by the emergency room for chronic problems with recurrent epigastric pain, nausea, and vomiting. The patient reports having had dozens of emergency room visits for this problem over the past few years. She tells me that her problems began around 2003. She also tells me that she underwent GI evaluation including colonoscopy and upper endoscopy (no records found and she cannot provide any details) without particular abnormalities found. She describes chronic nausea for which she takes Phenergan. Generally helpful. Also problems with belching and acid reflux for which she was recently placed on pantoprazole, but has run out. This was somewhat helpful. However, more acute episodes involve relentless epigastric discomfort with associated nausea and intractable vomiting. Oral Phenergan or suppositories do not help. She tells me that she requires emergency room visit for hydration and IV therapies. She has had imaging studies which suggest a large hiatal hernia. No further evaluation. She also tells me that she uses cannabis on a regular basis. In addition to current symptom she does describe mild dysphagia as well as bowel habits alternate between constipation and diarrhea. No bleeding. Plain films as well as previous abdominal ultrasound, and CT scan reviewed. Laboratories from October 2015 are negative except for mild leukocytosis  REVIEW OF SYSTEMS:  All non-GI ROS negative except for sinus and allergy trouble, back pain, depression, headaches, night sweats  Past Medical History  Diagnosis Date  . Hiatal hernia   . IBS (irritable bowel syndrome)   . Diverticulosis   . Bipolar affective disorder   . GERD (gastroesophageal reflux disease)   . Chronic headaches   . Depression   . Stroke     Past Surgical History  Procedure Laterality Date  .  Abdominal hysterectomy    . Breast lumpectomy Bilateral   . Foot fracture surgery Right     x 3  . Toe surgery Left     Social History [name]  reports that she has never smoked. She has never used smokeless tobacco. She reports that she uses illicit drugs (Marijuana). She reports that she does not drink alcohol.  family history includes Brain cancer in her mother; Breast cancer in her paternal grandmother and sister; Diabetes in her mother and sister.  No Known Allergies     PHYSICAL EXAMINATION: Vital signs: BP 110/70 mmHg  Pulse 96  Ht 5\' 6"  (1.676 m)  Wt 159 lb 2 oz (72.179 kg)  BMI 25.70 kg/m2  Constitutional: generally well-appearing, no acute distress Psychiatric: alert and oriented x3, cooperative Eyes: extraocular movements intact, anicteric, conjunctiva pink Mouth: oral pharynx moist, no lesions Neck: supple no lymphadenopathy Cardiovascular: heart regular rate and rhythm, no murmur Lungs: clear to auscultation bilaterally Abdomen: soft, nontender, nondistended, no obvious ascites, no peritoneal signs, normal bowel sounds, no organomegaly. No succussion splash Rectal:deferred Extremities: no lower extremity edema bilaterally Skin: no lesions on visible extremities Neuro: No focal deficits. No asterixis.  ASSESSMENT:  #1. Recurrent problems with nausea vomiting and epigastric pain. Possibly mechanical and related to have a hernia. Rule out cyclic vomiting syndrome secondary to cannabis as an alternative  #2. GERD. Symptoms improved on PPI. Now off PPI #3. Alternating bowel habits consistent with IBS #4. Multiple medical problems including prior stroke   PLAN:  #1. Upper GI series to rule out esophageal hernia or volvulus-type picture #2. Diagnostic upper endoscopy to further evaluate  GI symptoms as well as.The nature of the procedure, as well as the risks, benefits, and alternatives were carefully and thoroughly reviewed with the patient. Ample time for  discussion and questions allowed. The patient understood, was satisfied, and agreed to proceed. #3. Refill pantoprazole 40 mg daily #4. If process felt to be mechanical, general surgical referral after workup complete #5. Sounds are she had negative colonoscopy about 10 years ago. Would be due for follow-up for routine screening purposes. Deferred at this time until other acute GI problems resolved. She understands.

## 2014-09-12 ENCOUNTER — Ambulatory Visit (HOSPITAL_COMMUNITY)
Admission: RE | Admit: 2014-09-12 | Discharge: 2014-09-12 | Disposition: A | Discharge status: Home / Self Care | Payer: Medicare Other | Source: Ambulatory Visit | Attending: Internal Medicine | Admitting: Internal Medicine

## 2014-09-12 DIAGNOSIS — R935 Abnormal findings on diagnostic imaging of other abdominal regions, including retroperitoneum: Secondary | ICD-10-CM

## 2014-09-12 DIAGNOSIS — R1013 Epigastric pain: Secondary | ICD-10-CM | POA: Diagnosis present

## 2014-09-12 DIAGNOSIS — R112 Nausea with vomiting, unspecified: Secondary | ICD-10-CM | POA: Diagnosis present

## 2014-09-12 DIAGNOSIS — K449 Diaphragmatic hernia without obstruction or gangrene: Secondary | ICD-10-CM | POA: Diagnosis present

## 2014-09-12 DIAGNOSIS — K228 Other specified diseases of esophagus: Secondary | ICD-10-CM | POA: Insufficient documentation

## 2014-09-14 ENCOUNTER — Emergency Department (HOSPITAL_COMMUNITY): Payer: Medicare Other

## 2014-09-14 ENCOUNTER — Encounter (HOSPITAL_COMMUNITY): Payer: Self-pay | Admitting: *Deleted

## 2014-09-14 ENCOUNTER — Emergency Department (HOSPITAL_COMMUNITY)
Admission: EM | Admit: 2014-09-14 | Discharge: 2014-09-14 | Disposition: A | Discharge status: Home / Self Care | Payer: Medicare Other | Attending: Emergency Medicine | Admitting: Emergency Medicine

## 2014-09-14 DIAGNOSIS — R1110 Vomiting, unspecified: Secondary | ICD-10-CM

## 2014-09-14 DIAGNOSIS — K219 Gastro-esophageal reflux disease without esophagitis: Secondary | ICD-10-CM | POA: Diagnosis not present

## 2014-09-14 DIAGNOSIS — R1013 Epigastric pain: Secondary | ICD-10-CM | POA: Diagnosis present

## 2014-09-14 DIAGNOSIS — F319 Bipolar disorder, unspecified: Secondary | ICD-10-CM | POA: Insufficient documentation

## 2014-09-14 DIAGNOSIS — K449 Diaphragmatic hernia without obstruction or gangrene: Secondary | ICD-10-CM

## 2014-09-14 DIAGNOSIS — Z79899 Other long term (current) drug therapy: Secondary | ICD-10-CM | POA: Insufficient documentation

## 2014-09-14 DIAGNOSIS — Z7982 Long term (current) use of aspirin: Secondary | ICD-10-CM | POA: Insufficient documentation

## 2014-09-14 DIAGNOSIS — Z7951 Long term (current) use of inhaled steroids: Secondary | ICD-10-CM | POA: Insufficient documentation

## 2014-09-14 DIAGNOSIS — Z8673 Personal history of transient ischemic attack (TIA), and cerebral infarction without residual deficits: Secondary | ICD-10-CM | POA: Diagnosis not present

## 2014-09-14 LAB — CBC WITH DIFFERENTIAL/PLATELET
Basophils Absolute: 0 10*3/uL (ref 0.0–0.1)
Basophils Relative: 0 % (ref 0–1)
EOS ABS: 0 10*3/uL (ref 0.0–0.7)
EOS PCT: 0 % (ref 0–5)
HCT: 45.7 % (ref 36.0–46.0)
Hemoglobin: 16 g/dL — ABNORMAL HIGH (ref 12.0–15.0)
LYMPHS ABS: 1.4 10*3/uL (ref 0.7–4.0)
Lymphocytes Relative: 12 % (ref 12–46)
MCH: 33.8 pg (ref 26.0–34.0)
MCHC: 35 g/dL (ref 30.0–36.0)
MCV: 96.4 fL (ref 78.0–100.0)
Monocytes Absolute: 0.4 10*3/uL (ref 0.1–1.0)
Monocytes Relative: 3 % (ref 3–12)
Neutro Abs: 9.5 10*3/uL — ABNORMAL HIGH (ref 1.7–7.7)
Neutrophils Relative %: 85 % — ABNORMAL HIGH (ref 43–77)
PLATELETS: 319 10*3/uL (ref 150–400)
RBC: 4.74 MIL/uL (ref 3.87–5.11)
RDW: 12.9 % (ref 11.5–15.5)
WBC: 11.3 10*3/uL — ABNORMAL HIGH (ref 4.0–10.5)

## 2014-09-14 LAB — URINALYSIS, ROUTINE W REFLEX MICROSCOPIC
Bilirubin Urine: NEGATIVE
Glucose, UA: NEGATIVE mg/dL
Ketones, ur: NEGATIVE mg/dL
LEUKOCYTES UA: NEGATIVE
Nitrite: NEGATIVE
PROTEIN: 30 mg/dL — AB
Specific Gravity, Urine: 1.017 (ref 1.005–1.030)
UROBILINOGEN UA: 0.2 mg/dL (ref 0.0–1.0)
pH: 8 (ref 5.0–8.0)

## 2014-09-14 LAB — COMPREHENSIVE METABOLIC PANEL
ALT: 11 U/L (ref 0–35)
ANION GAP: 16 — AB (ref 5–15)
AST: 21 U/L (ref 0–37)
Albumin: 4.3 g/dL (ref 3.5–5.2)
Alkaline Phosphatase: 131 U/L — ABNORMAL HIGH (ref 39–117)
BUN: 13 mg/dL (ref 6–23)
CO2: 24 mEq/L (ref 19–32)
Calcium: 10.2 mg/dL (ref 8.4–10.5)
Chloride: 99 mEq/L (ref 96–112)
Creatinine, Ser: 0.65 mg/dL (ref 0.50–1.10)
GFR calc non Af Amer: >90 mL/min (ref >90)
GLUCOSE: 119 mg/dL — AB (ref 70–99)
Potassium: 3.8 mEq/L (ref 3.7–5.3)
SODIUM: 139 meq/L (ref 137–147)
TOTAL PROTEIN: 8.4 g/dL — AB (ref 6.0–8.3)
Total Bilirubin: 0.3 mg/dL (ref 0.3–1.2)

## 2014-09-14 LAB — LIPASE, BLOOD: Lipase: 11 U/L (ref 11–59)

## 2014-09-14 LAB — URINE MICROSCOPIC-ADD ON

## 2014-09-14 MED ORDER — LORAZEPAM 2 MG/ML IJ SOLN
0.5000 mg | Freq: Once | INTRAMUSCULAR | Status: AC
  Administered 2014-09-14: 0.5 mg via INTRAVENOUS
  Filled 2014-09-14: qty 1

## 2014-09-14 MED ORDER — MORPHINE SULFATE 4 MG/ML IJ SOLN
4.0000 mg | Freq: Once | INTRAMUSCULAR | Status: AC
  Administered 2014-09-14: 4 mg via INTRAVENOUS
  Filled 2014-09-14: qty 1

## 2014-09-14 MED ORDER — ONDANSETRON HCL 4 MG/2ML IJ SOLN
4.0000 mg | Freq: Once | INTRAMUSCULAR | Status: DC
  Filled 2014-09-14: qty 2

## 2014-09-14 MED ORDER — FAMOTIDINE IN NACL 20-0.9 MG/50ML-% IV SOLN
20.0000 mg | Freq: Once | INTRAVENOUS | Status: AC
  Administered 2014-09-14: 20 mg via INTRAVENOUS
  Filled 2014-09-14: qty 50

## 2014-09-14 MED ORDER — ONDANSETRON HCL 4 MG/2ML IJ SOLN
4.0000 mg | Freq: Once | INTRAMUSCULAR | Status: AC
  Administered 2014-09-14: 4 mg via INTRAVENOUS

## 2014-09-14 NOTE — ED Provider Notes (Signed)
CSN: [id number]     Arrival date & time 09/14/14  1545 History   First MD Initiated Contact with Patient 09/14/14 1559     Chief Complaint  Patient presents with  . Abdominal Pain     (Consider location/radiation/quality/duration/timing/severity/associated sxs/prior Treatment) HPI   [name] is a(n) 58 y.o. female who presents to the ED with CC of abdominal pain, nausea and vomiting. She has a long-standing history of cyclic vomiting syndrome. The patient states around 2 AM she awoke with severe epigastric pain and pressure and began having intractable vomiting. She take 1 Phenergan without relief and actually had increased vomiting. Patient took a second Phenergan suppository and her vomiting slowed however did not fully resolve until about one hour ago. She states that she had continued retching and vomiting of fluid and gastric acid. She states is similar to her previous episodes. She was recently seen by Dr. Henrene Pastor at [place] had GI series done. She is scheduled for surgery 11/04/2014 to repair her hiatal hernia which is likely the source of her recurrent cyclical vomiting. Denies fevers, chills, myalgias, arthralgias. Denies DOE, SOB, chest tightness or pressure, radiation to left arm, jaw or back, or diaphoresis. Denies dysuria, flank pain, suprapubic pain, frequency, urgency, or hematuria. Denies headaches, light headedness, weakness, visual disturbances. Denies diarrhea or constipation.She denies history of surgeries.    Past Medical History  Diagnosis Date  . Hiatal hernia   . IBS (irritable bowel syndrome)   . Diverticulosis   . Bipolar affective disorder   . GERD (gastroesophageal reflux disease)   . Chronic headaches   . Depression   . Stroke    Past Surgical History  Procedure Laterality Date  . Abdominal hysterectomy    . Breast lumpectomy Bilateral   . Foot fracture surgery Right     x 3  . Toe surgery Left    Family History  Problem Relation Age of Onset   . Brain cancer Mother   . Breast cancer Sister   . Breast cancer Paternal Grandmother   . Diabetes Mother   . Diabetes Sister     x 2   History  Substance Use Topics  . Smoking status: Never Smoker   . Smokeless tobacco: Never Used  . Alcohol Use: No   OB History    No data available     Review of Systems  Ten systems reviewed and are negative for acute change, except as noted in the HPI.    Allergies  Review of patient's allergies indicates no known allergies.  Home Medications   Prior to Admission medications   Medication Sig Start Date End Date Taking? Authorizing Provider  aspirin EC 325 MG tablet Take 325 mg by mouth daily.   Yes Historical Provider, MD  cyclobenzaprine (FLEXERIL) 10 MG tablet Take 10 mg by mouth 3 (three) times daily as needed for muscle spasms. For muscle spasms in back   Yes Historical Provider, MD  diclofenac sodium (VOLTAREN) 1 % GEL Apply 1 g topically 3 (three) times daily as needed (back and elbow pain .). To back and elbow   Yes Historical Provider, MD  dicyclomine (BENTYL) 20 MG tablet Take 20 mg by mouth 3 (three) times daily as needed. For intestinal spasms   Yes Historical Provider, MD  diphenhydrAMINE (BENADRYL) 25 MG tablet Take 50 mg by mouth every 6 (six) hours as needed for itching or allergies.   Yes Historical Provider, MD  fluticasone (FLONASE) 50 MCG/ACT nasal spray Place  2 sprays into the nose daily.   Yes Historical Provider, MD  lactose free nutrition (BOOST) LIQD Take 237 mLs by mouth 3 (three) times daily between meals.   Yes Historical Provider, MD  lamoTRIgine (LAMICTAL) 150 MG tablet Take 150 mg by mouth daily.    Yes Historical Provider, MD  pantoprazole (PROTONIX) 20 MG tablet Take 1 tablet (20 mg total) by mouth daily. 07/09/14  Yes Charlesetta Shanks, MD  perphenazine (TRILAFON) 4 MG tablet Take 4 mg by mouth 3 (three) times daily.    Yes Historical Provider, MD  pramipexole (MIRAPEX) 0.125 MG tablet Take 0.125 mg by mouth at  bedtime.   Yes Historical Provider, MD  prochlorperazine (COMPAZINE) 10 MG tablet Take 1 tablet (10 mg total) by mouth 2 (two) times daily as needed for nausea or vomiting. 07/28/14  Yes Evelina Bucy, MD  promethazine (PHENERGAN) 25 MG suppository Place 25 mg rectally every 6 (six) hours as needed for nausea or vomiting.   Yes Historical Provider, MD  promethazine (PHENERGAN) 25 MG tablet Take 1 tablet (25 mg total) by mouth every 6 (six) hours as needed for nausea or vomiting. 04/03/14  Yes Kristen N Ward, DO  venlafaxine XR (EFFEXOR-XR) 75 MG 24 hr capsule Take 75 mg by mouth daily.   Yes Historical Provider, MD   BP 125/92 mmHg  Pulse 87  Temp(Src) 98.6 F (37 C) (Oral)  Resp 18  SpO2 100% Physical Exam  Constitutional: She is oriented to person, place, and time. She appears well-developed and well-nourished. No distress.  Tearful   HENT:  Head: Normocephalic and atraumatic.  Dry oral mucosa  Eyes: Conjunctivae are normal. No scleral icterus.  Neck: Normal range of motion. Neck supple.  Cardiovascular: Normal rate, regular rhythm and normal heart sounds.  Exam reveals no gallop and no friction rub.   No murmur heard. Pulmonary/Chest: Effort normal and breath sounds normal. No respiratory distress.  Abdominal: Soft. Bowel sounds are normal. She exhibits no distension and no mass. There is tenderness (Epigastrium). There is no guarding.  Neurological: She is alert and oriented to person, place, and time.  Skin: Skin is warm and dry. She is not diaphoretic.  Nursing note and vitals reviewed.   ED Course  Procedures (including critical care time) Labs Review Labs Reviewed - No data to display  Imaging Review No results found.   EKG Interpretation None      MDM   Final diagnoses:  Vomiting    The causes for generalized abdominal pain include but are not limited to gastritis, gastroenteritis, IBS, pancreatitis, peritonitis, intestinal ischemia, constipation, UTI, intestinal  obstruction, perforated viscus, eg, peptic ulcer, appendix, gallbladder, diverticulitis, physical or sexual abuse, abdominal abscess, ruptured spleen, AAA, diabetic ketoacidosis, hypercalcemia, uremia, parasitic or other infection, eg: tapeworms, flukes, Giargia, Cryto, Yersinia, adrenal insufficiency,lead poisoning, iron toxicity, polyarteritis nodosa, Henoch-Schnlein purpura, porphyria, eg, acute intermittent porphyria, familial Mediterranean fever.  This is likely a recurrence of her cyclic vomiting. Labs and imaging pending.  Antiemetics and pain meds ordered along with fluids.     Patient is nontoxic, nonseptic appearing, in no apparent distress.  Patient's pain and other symptoms adequately managed in emergency department.  Fluid bolus given.  Labs, imaging and vitals reviewed.  Patient does not meet the SIRS or Sepsis criteria.  On repeat exam patient does not have a surgical abdomin and there are no peritoneal signs.  No indication of appendicitis, bowel obstruction, bowel perforation, cholecystitis, diverticulitis.Patient discharged home with symptomatic treatment and given strict  instructions for follow-up with their primary care physician.  I have also discussed reasons to return immediately to the ER.  Patient expresses understanding and agrees with plan.     Margarita Mail, PA-C 09/14/14 2100  Artis Delay, MD 09/14/14 [phone]

## 2014-09-14 NOTE — ED Notes (Signed)
Bed: QH47 Expected date: 09/14/14 Expected time: 3:40 PM Means of arrival: Ambulance Comments: N/V

## 2014-09-14 NOTE — ED Provider Notes (Signed)
Medical screening examination/treatment/procedure(s) were conducted as a shared visit with non-physician practitioner(s) and myself.  I personally evaluated the patient during the encounter.   EKG Interpretation None      58 yo female with cyclic vomiting and hiatal hernia who presents with vomiting and epigastric abdominal pain.  She states that this episode is in no way different from all of her prior episodes.  On exam, well appearing, nontoxic, not distressed, normal respiratory effort, normal perfusion, mild epigastric tenderness, no abdominal tenderness elsewhere, no r/r/g.  Plan symptom control, screening labs.    Symptoms controlled . abd exam reassuring.  Stable for dc home  Clinical Impression: 1. Abdominal pain, epigastric   2. Vomiting   3. Hiatal hernia       Artis Delay, MD 09/14/14 [phone]

## 2014-09-14 NOTE — Discharge Instructions (Signed)
Hiatal Hernia A hiatal hernia occurs when part of your stomach slides above the muscle that separates your abdomen from your chest (diaphragm). You can be born with a hiatal hernia (congenital), or it may develop over time. In almost all cases of hiatal hernia, only the top part of the stomach pushes through.  Many people have a hiatal hernia with no symptoms. The larger the hernia, the more likely that you will have symptoms. In some cases, a hiatal hernia allows stomach acid to flow back into the tube that carries food from your mouth to your stomach (esophagus). This may cause heartburn symptoms. Severe heartburn symptoms may mean you have developed a condition called gastroesophageal reflux disease (GERD).  CAUSES  Hiatal hernias are caused by a weakness in the opening (hiatus) where your esophagus passes through your diaphragm to attach to the upper part of your stomach. You may be born with a weakness in your hiatus, or a weakness can develop. RISK FACTORS Older age is a major risk factor for a hiatal hernia. Anything that increases pressure on your diaphragm can also increase your risk of a hiatal hernia. This includes:  Pregnancy.  Excess weight.  Frequent constipation. SIGNS AND SYMPTOMS  People with a hiatal hernia often have no symptoms. If symptoms develop, they are almost always caused by GERD. They may include:  Heartburn.  Belching.  Indigestion.  Trouble swallowing.  Coughing or wheezing.  Sore throat.  Hoarseness.  Chest pain. DIAGNOSIS  A hiatal hernia is sometimes found during an exam for another problem. Your health care provider may suspect a hiatal hernia if you have symptoms of GERD. Tests may be done to diagnose GERD. These may include:  X-rays of your stomach or chest.  An upper gastrointestinal (GI) series. This is an X-ray exam of your GI tract involving the use of a chalky liquid that you swallow. The liquid shows up clearly on the X-ray.  Endoscopy.  This is a procedure to look into your stomach using a thin, flexible tube that has a tiny camera and light on the end of it. TREATMENT  If you have no symptoms, you may not need treatment. If you have symptoms, treatment may include:  Dietary and lifestyle changes to help reduce GERD symptoms.  Medicines. These may include:  Over-the-counter antacids.  Medicines that make your stomach empty more quickly.  Medicines that block the production of stomach acid (H2 blockers).  Stronger medicines to reduce stomach acid (proton pump inhibitors).  You may need surgery to repair the hernia if other treatments are not helping. HOME CARE INSTRUCTIONS   Take all medicines as directed by your health care provider.  Quit smoking, if you smoke.  Try to achieve and maintain a healthy body weight.  Eat frequent small meals instead of three large meals a day. This keeps your stomach from getting too full.  Eat slowly.  Do not lie down right after eating.  Do noteat 1-2 hours before bed.   Do not drink beverages with caffeine. These include cola, coffee, cocoa, and tea.  Do not drink alcohol.  Avoid foods that can make symptoms of GERD worse. These may include:  Fatty foods.  Citrus fruits.  Other foods and drinks that contain acid.  Avoid putting pressure on your belly. Anything that puts pressure on your belly increases the amount of acid that may be pushed up into your esophagus.   Avoid bending over, especially after eating.  Raise the head of your bed   by putting blocks under the legs. This keeps your head and esophagus higher than your stomach.  Do not wear tight clothing around your chest or stomach.  Try not to strain when having a bowel movement, when urinating, or when lifting heavy objects. SEEK MEDICAL CARE IF:  Your symptoms are not controlled with medicines or lifestyle changes.  You are having trouble swallowing.  You have coughing or wheezing that will not  go away. SEEK IMMEDIATE MEDICAL CARE IF:  Your pain is getting worse.  Your pain spreads to your arms, neck, jaw, teeth, or back.  You have shortness of breath.  You sweat for no reason.  You feel sick to your stomach (nauseous) or vomit.  You vomit blood.  You have bright red blood in your stools.  You have black, tarry stools.  Document Released: 01/07/2004 Document Revised: 03/03/2014 Document Reviewed: 10/04/2013 [employer] Patient Information 2015 [employer]. This information is not intended to replace advice given to you by your health care provider. Make sure you discuss any questions you have with your health care provider.  

## 2014-09-14 NOTE — ED Notes (Signed)
Per EMS pt coming from home with c/o LUQ pain, reports hiatal hernia, pt sts she can feel her "hernia is buzzing".

## 2014-09-18 ENCOUNTER — Telehealth: Payer: Self-pay | Admitting: Internal Medicine

## 2014-09-19 MED ORDER — PANTOPRAZOLE SODIUM 40 MG PO TBEC: 40.0000 mg | DELAYED_RELEASE_TABLET | Freq: Every day | ORAL | Status: DC

## 2014-09-19 NOTE — Telephone Encounter (Signed)
Sent rx for Protonix to [facility]

## 2014-11-04 ENCOUNTER — Telehealth: Payer: Self-pay | Admitting: Internal Medicine

## 2014-11-04 ENCOUNTER — Encounter: Payer: Medicare Other | Admitting: Internal Medicine

## 2014-11-12 NOTE — Telephone Encounter (Signed)
NOT BILLED EGD NO SHOW FEE Due to her Insurance

## 2015-01-10 ENCOUNTER — Other Ambulatory Visit: Payer: Self-pay | Admitting: Internal Medicine

## 2015-02-25 ENCOUNTER — Emergency Department (HOSPITAL_COMMUNITY)
Admission: EM | Admit: 2015-02-25 | Discharge: 2015-02-25 | Disposition: A | Discharge status: Home / Self Care | Payer: Medicare HMO | Attending: Emergency Medicine | Admitting: Emergency Medicine

## 2015-02-25 ENCOUNTER — Emergency Department (HOSPITAL_COMMUNITY): Payer: Medicare HMO

## 2015-02-25 ENCOUNTER — Encounter (HOSPITAL_COMMUNITY): Payer: Self-pay | Admitting: Emergency Medicine

## 2015-02-25 DIAGNOSIS — F319 Bipolar disorder, unspecified: Secondary | ICD-10-CM | POA: Insufficient documentation

## 2015-02-25 DIAGNOSIS — J3489 Other specified disorders of nose and nasal sinuses: Secondary | ICD-10-CM | POA: Diagnosis not present

## 2015-02-25 DIAGNOSIS — Z90710 Acquired absence of both cervix and uterus: Secondary | ICD-10-CM | POA: Insufficient documentation

## 2015-02-25 DIAGNOSIS — Z791 Long term (current) use of non-steroidal anti-inflammatories (NSAID): Secondary | ICD-10-CM | POA: Insufficient documentation

## 2015-02-25 DIAGNOSIS — Z7982 Long term (current) use of aspirin: Secondary | ICD-10-CM | POA: Insufficient documentation

## 2015-02-25 DIAGNOSIS — R1013 Epigastric pain: Secondary | ICD-10-CM

## 2015-02-25 DIAGNOSIS — K219 Gastro-esophageal reflux disease without esophagitis: Secondary | ICD-10-CM | POA: Insufficient documentation

## 2015-02-25 DIAGNOSIS — Z7951 Long term (current) use of inhaled steroids: Secondary | ICD-10-CM | POA: Insufficient documentation

## 2015-02-25 DIAGNOSIS — R112 Nausea with vomiting, unspecified: Secondary | ICD-10-CM | POA: Diagnosis not present

## 2015-02-25 DIAGNOSIS — Z79899 Other long term (current) drug therapy: Secondary | ICD-10-CM | POA: Insufficient documentation

## 2015-02-25 DIAGNOSIS — Z8673 Personal history of transient ischemic attack (TIA), and cerebral infarction without residual deficits: Secondary | ICD-10-CM | POA: Diagnosis not present

## 2015-02-25 DIAGNOSIS — R05 Cough: Secondary | ICD-10-CM | POA: Insufficient documentation

## 2015-02-25 LAB — URINALYSIS, ROUTINE W REFLEX MICROSCOPIC
BILIRUBIN URINE: NEGATIVE
GLUCOSE, UA: NEGATIVE mg/dL
Ketones, ur: NEGATIVE mg/dL
Leukocytes, UA: NEGATIVE
Nitrite: NEGATIVE
PH: 8 (ref 5.0–8.0)
Protein, ur: NEGATIVE mg/dL
Specific Gravity, Urine: 1.012 (ref 1.005–1.030)
Urobilinogen, UA: 0.2 mg/dL (ref 0.0–1.0)

## 2015-02-25 LAB — COMPREHENSIVE METABOLIC PANEL
ALBUMIN: 4.2 g/dL (ref 3.5–5.2)
ALT: 15 U/L (ref 0–35)
ANION GAP: 13 (ref 5–15)
AST: 26 U/L (ref 0–37)
Alkaline Phosphatase: 105 U/L (ref 39–117)
BILIRUBIN TOTAL: 0.6 mg/dL (ref 0.3–1.2)
BUN: 12 mg/dL (ref 6–23)
CO2: 22 mmol/L (ref 19–32)
Calcium: 9.3 mg/dL (ref 8.4–10.5)
Chloride: 106 mmol/L (ref 96–112)
Creatinine, Ser: 0.62 mg/dL (ref 0.50–1.10)
GFR calc Af Amer: >90 mL/min (ref >90)
GLUCOSE: 130 mg/dL — AB (ref 70–99)
Potassium: 3.5 mmol/L (ref 3.5–5.1)
Sodium: 141 mmol/L (ref 135–145)
Total Protein: 7.7 g/dL (ref 6.0–8.3)

## 2015-02-25 LAB — CBC WITH DIFFERENTIAL/PLATELET
BASOS PCT: 0 % (ref 0–1)
Basophils Absolute: 0.1 10*3/uL (ref 0.0–0.1)
Eosinophils Absolute: 0 10*3/uL (ref 0.0–0.7)
Eosinophils Relative: 0 % (ref 0–5)
HCT: 43.5 % (ref 36.0–46.0)
Hemoglobin: 15.2 g/dL — ABNORMAL HIGH (ref 12.0–15.0)
Lymphocytes Relative: 15 % (ref 12–46)
Lymphs Abs: 2.9 10*3/uL (ref 0.7–4.0)
MCH: 32.9 pg (ref 26.0–34.0)
MCHC: 34.9 g/dL (ref 30.0–36.0)
MCV: 94.2 fL (ref 78.0–100.0)
Monocytes Absolute: 0.5 10*3/uL (ref 0.1–1.0)
Monocytes Relative: 2 % — ABNORMAL LOW (ref 3–12)
NEUTROS PCT: 83 % — AB (ref 43–77)
Neutro Abs: 16.7 10*3/uL — ABNORMAL HIGH (ref 1.7–7.7)
PLATELETS: 374 10*3/uL (ref 150–400)
RBC: 4.62 MIL/uL (ref 3.87–5.11)
RDW: 13.5 % (ref 11.5–15.5)
WBC: 20.1 10*3/uL — ABNORMAL HIGH (ref 4.0–10.5)

## 2015-02-25 LAB — LIPASE, BLOOD: LIPASE: 14 U/L (ref 11–59)

## 2015-02-25 LAB — URINE MICROSCOPIC-ADD ON

## 2015-02-25 MED ORDER — SODIUM CHLORIDE 0.9 % IV BOLUS (SEPSIS)
1000.0000 mL | Freq: Once | INTRAVENOUS | Status: AC
  Administered 2015-02-25: 1000 mL via INTRAVENOUS

## 2015-02-25 MED ORDER — DIPHENHYDRAMINE HCL 50 MG/ML IJ SOLN
25.0000 mg | Freq: Once | INTRAMUSCULAR | Status: AC
  Administered 2015-02-25: 25 mg via INTRAVENOUS
  Filled 2015-02-25: qty 1

## 2015-02-25 MED ORDER — IOHEXOL 300 MG/ML  SOLN
25.0000 mL | Freq: Once | INTRAMUSCULAR | Status: AC | PRN
  Administered 2015-02-25: 25 mL via ORAL

## 2015-02-25 MED ORDER — METOCLOPRAMIDE HCL 5 MG/ML IJ SOLN
10.0000 mg | Freq: Once | INTRAMUSCULAR | Status: AC
Start: 2015-02-25 — End: 2015-02-25
  Administered 2015-02-25: 10 mg via INTRAVENOUS
  Filled 2015-02-25: qty 2

## 2015-02-25 MED ORDER — IOHEXOL 300 MG/ML  SOLN
80.0000 mL | Freq: Once | INTRAMUSCULAR | Status: AC | PRN
  Administered 2015-02-25: 80 mL via INTRAVENOUS

## 2015-02-25 MED ORDER — MORPHINE SULFATE 4 MG/ML IJ SOLN
4.0000 mg | Freq: Once | INTRAMUSCULAR | Status: AC
  Administered 2015-02-25: 4 mg via INTRAVENOUS
  Filled 2015-02-25: qty 1

## 2015-02-25 MED ORDER — ONDANSETRON HCL 4 MG/2ML IJ SOLN
4.0000 mg | Freq: Once | INTRAMUSCULAR | Status: AC
  Administered 2015-02-25: 4 mg via INTRAVENOUS
  Filled 2015-02-25: qty 2

## 2015-02-25 NOTE — ED Notes (Signed)
Per EMS- pt had sudden onset n/v this morning, along with upper abdominal pain. Hernia operation in December, per pt this pain is near her hernia. IM zofran given 4mg . NSR on monitor. bp 170/100. Diverticulitis in med hx. Alert and oriented x4.

## 2015-02-25 NOTE — ED Notes (Signed)
After zofran was given, pt left arm immediately broke out into hives. Pt denies sob. Denies itching. Per PA give 25 mg benadryl

## 2015-02-25 NOTE — ED Notes (Signed)
Pt unable to keep any of oral contrast down without vomiting. PA aware, IV contrast only is fine.

## 2015-02-25 NOTE — ED Notes (Signed)
Pt leaving for CT.  

## 2015-02-25 NOTE — ED Provider Notes (Signed)
CSN: [id number]     Arrival date & time 02/25/15  1143 History   First MD Initiated Contact with Patient 02/25/15 1145     Chief Complaint  Patient presents with  . Abdominal Pain   NOBLE BODIE is a 59 y.o. female with a history of irritable bowel syndrome, diverticulosis, hiatal hernia repair and hysterectomy presents the emergency department complaining of nausea and vomiting ongoing for the past 6 hours associated with epigastric pain. Patient reports she started having nausea and vomiting this morning which progressed to having 10 out of 10 sharp epigastric pain. She reports vomiting 6 times today. She denies any diarrhea. The patient reports her last bowel movement was this morning and it was normal. The patient reports that she had hiatal hernia repair done by a surgeon in [place] on 09/23/2014. Patient also reports she's been recovering from a sinus infection and has been on amoxicillin. The patient denies fevers, hematemesis, hematochezia, diarrhea, chest pain, shortness of breath, palpitations, urinary symptoms, or rashes.  (Consider location/radiation/quality/duration/timing/severity/associated sxs/prior Treatment) HPI  Past Medical History  Diagnosis Date  . Hiatal hernia   . IBS (irritable bowel syndrome)   . Diverticulosis   . Bipolar affective disorder   . GERD (gastroesophageal reflux disease)   . Chronic headaches   . Depression   . Stroke    Past Surgical History  Procedure Laterality Date  . Abdominal hysterectomy    . Breast lumpectomy Bilateral   . Foot fracture surgery Right     x 3  . Toe surgery Left    Family History  Problem Relation Age of Onset  . Brain cancer Mother   . Breast cancer Sister   . Breast cancer Paternal Grandmother   . Diabetes Mother   . Diabetes Sister     x 2   History  Substance Use Topics  . Smoking status: Never Smoker   . Smokeless tobacco: Never Used  . Alcohol Use: No   OB History    No data available      Review of Systems  Constitutional: Negative for fever and chills.  HENT: Positive for rhinorrhea. Negative for congestion and sore throat.   Eyes: Negative for visual disturbance.  Respiratory: Positive for cough. Negative for shortness of breath and wheezing.   Cardiovascular: Negative for chest pain and palpitations.  Gastrointestinal: Positive for nausea, vomiting and abdominal pain. Negative for diarrhea.  Genitourinary: Negative for dysuria, frequency, hematuria, flank pain, vaginal bleeding, vaginal discharge and difficulty urinating.  Musculoskeletal: Negative for back pain and neck pain.  Skin: Negative for rash.  Neurological: Negative for weakness, light-headedness and headaches.      Allergies  Review of patient's allergies indicates no known allergies.  Home Medications   Prior to Admission medications   Medication Sig Start Date End Date Taking? Authorizing Provider  aspirin EC 325 MG tablet Take 325 mg by mouth daily.   Yes Historical Provider, MD  cyclobenzaprine (FLEXERIL) 10 MG tablet Take 10 mg by mouth 3 (three) times daily as needed for muscle spasms. For muscle spasms in back   Yes Historical Provider, MD  diclofenac sodium (VOLTAREN) 1 % GEL Apply 1 g topically 3 (three) times daily as needed (back and elbow pain .). To back and elbow   Yes Historical Provider, MD  dicyclomine (BENTYL) 20 MG tablet Take 20 mg by mouth 3 (three) times daily as needed. For intestinal spasms   Yes Historical Provider, MD  fluticasone (FLONASE) 50 MCG/ACT nasal spray  Place 2 sprays into the nose daily.   Yes Historical Provider, MD  lactose free nutrition (BOOST) LIQD Take 237 mLs by mouth 3 (three) times daily between meals.   Yes Historical Provider, MD  lamoTRIgine (LAMICTAL) 150 MG tablet Take 150 mg by mouth daily.    Yes Historical Provider, MD  pantoprazole (PROTONIX) 40 MG tablet TAKE 1 TABLET BY MOUTH EVERY DAY 01/12/15  Yes Irene Shipper, MD  perphenazine (TRILAFON) 4 MG  tablet Take 4 mg by mouth 3 (three) times daily.    Yes Historical Provider, MD  pramipexole (MIRAPEX) 0.125 MG tablet Take 0.125 mg by mouth at bedtime.   Yes Historical Provider, MD  prochlorperazine (COMPAZINE) 10 MG tablet Take 1 tablet (10 mg total) by mouth 2 (two) times daily as needed for nausea or vomiting. 07/28/14  Yes Evelina Bucy, MD  promethazine (PHENERGAN) 25 MG suppository Place 25 mg rectally every 6 (six) hours as needed for nausea or vomiting.   Yes Historical Provider, MD  promethazine (PHENERGAN) 25 MG tablet Take 1 tablet (25 mg total) by mouth every 6 (six) hours as needed for nausea or vomiting. 04/03/14  Yes Kristen N Ward, DO  venlafaxine XR (EFFEXOR-XR) 75 MG 24 hr capsule Take 75 mg by mouth daily.   Yes Historical Provider, MD  pantoprazole (PROTONIX) 20 MG tablet Take 1 tablet (20 mg total) by mouth daily. 07/09/14   Charlesetta Shanks, MD   BP 176/87 mmHg  Pulse 69  Temp(Src) 97.9 F (36.6 C) (Oral)  Resp 21  SpO2 98% Physical Exam  Constitutional: She is oriented to person, place, and time. She appears well-developed and well-nourished. No distress.  HENT:  Head: Normocephalic and atraumatic.  Mouth/Throat: Oropharynx is clear and moist. No oropharyngeal exudate.  Eyes: Conjunctivae are normal. Pupils are equal, round, and reactive to light. Right eye exhibits no discharge. Left eye exhibits no discharge.  Neck: Neck supple. No JVD present. No tracheal deviation present.  Cardiovascular: Normal rate, regular rhythm, normal heart sounds and intact distal pulses.  Exam reveals no gallop and no friction rub.   No murmur heard. Bilateral radial pulses are intact.   Pulmonary/Chest: Effort normal and breath sounds normal. No respiratory distress. She has no wheezes. She has no rales. She exhibits no tenderness.  Abdominal: Soft. Bowel sounds are normal. She exhibits no distension and no mass. There is tenderness. There is no rebound.  Abdomen is soft. Bowel sounds are  present. Moderate epigastric tenderness. No McBurney's point tenderness. Negative psoas and operators sign.   Musculoskeletal: She exhibits no edema.  Lymphadenopathy:    She has no cervical adenopathy.  Neurological: She is alert and oriented to person, place, and time. Coordination normal.  Skin: Skin is warm and dry. No rash noted. She is not diaphoretic. No erythema. No pallor.  Psychiatric: She has a normal mood and affect. Her behavior is normal.  Nursing note and vitals reviewed.   ED Course  Procedures (including critical care time) Labs Review Labs Reviewed  URINALYSIS, ROUTINE W REFLEX MICROSCOPIC - Abnormal; Notable for the following:    APPearance CLOUDY (*)    Hgb urine dipstick SMALL (*)    All other components within normal limits  COMPREHENSIVE METABOLIC PANEL - Abnormal; Notable for the following:    Glucose, Bld 130 (*)    All other components within normal limits  CBC WITH DIFFERENTIAL/PLATELET - Abnormal; Notable for the following:    WBC 20.1 (*)    Hemoglobin 15.2 (*)  Neutrophils Relative % 83 (*)    Neutro Abs 16.7 (*)    Monocytes Relative 2 (*)    All other components within normal limits  URINE MICROSCOPIC-ADD ON - Abnormal; Notable for the following:    Squamous Epithelial / LPF FEW (*)    All other components within normal limits  LIPASE, BLOOD    Imaging Review Ct Abdomen Pelvis W Contrast  02/25/2015   CLINICAL DATA:  Acute onset nausea and vomiting today. Upper abdominal pain. History of hernia surgery December, 2015.  EXAM: CT ABDOMEN AND PELVIS WITH CONTRAST  TECHNIQUE: Multidetector CT imaging of the abdomen and pelvis was performed using the standard protocol following bolus administration of intravenous contrast.  CONTRAST:  80 mL OMNIPAQUE IOHEXOL 300 MG/ML  SOLN  COMPARISON:  CT abdomen and pelvis 07/08/2014, 02/03/2013 and 06/04/2011.  FINDINGS: A 0.5 cm right middle lobe nodule on image 4 is unchanged since 2012. There is no pleural or  pericardial effusion. Since the prior examination, the patient has undergone repair of a hiatal hernia. No recurrent hernia is identified. There is no fluid collection about the hernia repair site or other evidence of complication.  A small cyst in the left hepatic lobe measuring 0.9 cm is unchanged on image 22. The liver is otherwise unremarkable. The gallbladder, adrenal glands, spleen, pancreas and kidneys appear normal.  The patient is status post hysterectomy. Scattered colonic diverticula are most notable along the sigmoid colon but there is no evidence of diverticulitis. The stomach, small bowel and appendix are unremarkable. Scattered aortoiliac atherosclerosis without aneurysm is identified. There is no lymphadenopathy or fluid.  No lytic or sclerotic bony lesion is identified. No notable degenerative disease about the hips are lumbar spine is seen.  IMPRESSION: No acute abnormality. Status post hiatal hernia repair without complicating feature.  Diverticulosis without diverticulitis.   Electronically Signed   By: Inge Rise M.D.   On: 02/25/2015 16:25     EKG Interpretation None      Filed Vitals:   02/25/15 1345 02/25/15 1430 02/25/15 1515 02/25/15 1545  BP: 162/88 181/91 166/69 176/87  Pulse: 76 60 72 69  Temp:      TempSrc:      Resp: 28 24 21    SpO2: 98% 99% 97% 98%     MDM   Meds given in ED:  Medications  sodium chloride 0.9 % bolus 1,000 mL (0 mLs Intravenous Stopped 02/25/15 1430)  ondansetron (ZOFRAN) injection 4 mg (4 mg Intravenous Given 02/25/15 1245)  morphine 4 MG/ML injection 4 mg (4 mg Intravenous Given 02/25/15 1253)  diphenhydrAMINE (BENADRYL) injection 25 mg (25 mg Intravenous Given 02/25/15 1250)  iohexol (OMNIPAQUE) 300 MG/ML solution 25 mL (25 mLs Oral Contrast Given 02/25/15 1353)  metoCLOPramide (REGLAN) injection 10 mg (10 mg Intravenous Given 02/25/15 1430)  morphine 4 MG/ML injection 4 mg (4 mg Intravenous Given 02/25/15 1430)  iohexol (OMNIPAQUE) 300  MG/ML solution 80 mL (80 mLs Intravenous Contrast Given 02/25/15 1600)  morphine 4 MG/ML injection 4 mg (4 mg Intravenous Given 02/25/15 1616)    New Prescriptions   No medications on file    Final diagnoses:  Epigastric pain  Non-intractable vomiting with nausea, vomiting of unspecified type   This is a 59 y.o. female with a history of irritable bowel syndrome, diverticulosis, hiatal hernia repair and hysterectomy presents the emergency department complaining of nausea and vomiting ongoing for the past 6 hours associated with epigastric pain. Patient reports she started having nausea and vomiting  this morning which progressed to having 10 out of 10 sharp epigastric pain. She reports vomiting 6 times today. She denies any diarrhea. The patient reports her last bowel movement was this morning and it was normal. The patient reports that she had hiatal hernia repair done by a surgeon in [place] on 09/23/2014. Patient is afebrile and nontoxic appearing. She has moderate epigastric tenderness on palpation without other tenderness. No peritoneal signs. Due to history of hiatal hernia repair will CT scan this patient. Urinalysis returned negative for infection. CMP is unremarkable. Lipase is within normal limits. Her CBC shows a leukocytosis with a white count of 20.1 and is otherwise unremarkable. CT scan shows no acute abnormality and is status post hiatal hernia repair without complicating feature. It shows diverticulosis without diverticulitis. On reevaluation the patient reports feeling better and is no longer nauseated. She reports having Phenergan at home which she states usually works very well for her nausea. I advised her to continue using this and to have bowel rest today. I advised her to adhere to the brat diet tomorrow. Patient is due to follow-up with [facility]. Advised her to make a follow-up appointment as soon as possible. Strict return precautions provided. I advised the patient to  follow-up with their primary care provider this week. I advised the patient to return to the emergency department with new or worsening symptoms or new concerns. The patient verbalized understanding and agreement with plan.   This patient was discussed with Dr. Johnney Killian who agrees with assessment and plan.     Waynetta Pean, PA-C 02/25/15 1717  Charlesetta Shanks, MD 02/28/15 0040

## 2015-02-25 NOTE — Discharge Instructions (Signed)
Abdominal Pain °Many things can cause abdominal pain. Usually, abdominal pain is not caused by a disease and will improve without treatment. It can often be observed and treated at home. Your health care provider will do a physical exam and possibly order blood tests and X-rays to help determine the seriousness of your pain. However, in many cases, more time must pass before a clear cause of the pain can be found. Before that point, your health care provider may not know if you need more testing or further treatment. °HOME CARE INSTRUCTIONS  °Monitor your abdominal pain for any changes. The following actions may help to alleviate any discomfort you are experiencing: °· Only take over-the-counter or prescription medicines as directed by your health care provider. °· Do not take laxatives unless directed to do so by your health care provider. °· Try a clear liquid diet (broth, tea, or water) as directed by your health care provider. Slowly move to a bland diet as tolerated. °SEEK MEDICAL CARE IF: °· You have unexplained abdominal pain. °· You have abdominal pain associated with nausea or diarrhea. °· You have pain when you urinate or have a bowel movement. °· You experience abdominal pain that wakes you in the night. °· You have abdominal pain that is worsened or improved by eating food. °· You have abdominal pain that is worsened with eating fatty foods. °· You have a fever. °SEEK IMMEDIATE MEDICAL CARE IF:  °· Your pain does not go away within 2 hours. °· You keep throwing up (vomiting). °· Your pain is felt only in portions of the abdomen, such as the right side or the left lower portion of the abdomen. °· You pass bloody or black tarry stools. °MAKE SURE YOU: °· Understand these instructions.   °· Will watch your condition.   °· Will get help right away if you are not doing well or get worse.   °Document Released: 07/27/2005 Document Revised: 10/22/2013 Document Reviewed: 06/26/2013 °[employer]® Patient Information  ©2015 [employer]. This information is not intended to replace advice given to you by your health care provider. Make sure you discuss any questions you have with your health care provider. ° °Nausea and Vomiting °Nausea is a sick feeling that often comes before throwing up (vomiting). Vomiting is a reflex where stomach contents come out of your mouth. Vomiting can cause severe loss of body fluids (dehydration). Children and elderly adults can become dehydrated quickly, especially if they also have diarrhea. Nausea and vomiting are symptoms of a condition or disease. It is important to find the cause of your symptoms. °CAUSES  °· Direct irritation of the stomach lining. This irritation can result from increased acid production (gastroesophageal reflux disease), infection, food poisoning, taking certain medicines (such as nonsteroidal anti-inflammatory drugs), alcohol use, or tobacco use. °· Signals from the brain. These signals could be caused by a headache, heat exposure, an inner ear disturbance, increased pressure in the brain from injury, infection, a tumor, or a concussion, pain, emotional stimulus, or metabolic problems. °· An obstruction in the gastrointestinal tract (bowel obstruction). °· Illnesses such as diabetes, hepatitis, gallbladder problems, appendicitis, kidney problems, cancer, sepsis, atypical symptoms of a heart attack, or eating disorders. °· Medical treatments such as chemotherapy and radiation. °· Receiving medicine that makes you sleep (general anesthetic) during surgery. °DIAGNOSIS °Your caregiver may ask for tests to be done if the problems do not improve after a few days. Tests may also be done if symptoms are severe or if the reason for the nausea   and vomiting is not clear. Tests may include: °· Urine tests. °· Blood tests. °· Stool tests. °· Cultures (to look for evidence of infection). °· X-rays or other imaging studies. °Test results can help your caregiver make decisions about  treatment or the need for additional tests. °TREATMENT °You need to stay well hydrated. Drink frequently but in small amounts. You may wish to drink water, sports drinks, clear broth, or eat frozen ice pops or gelatin dessert to help stay hydrated. When you eat, eating slowly may help prevent nausea. There are also some antinausea medicines that may help prevent nausea. °HOME CARE INSTRUCTIONS  °· Take all medicine as directed by your caregiver. °· If you do not have an appetite, do not force yourself to eat. However, you must continue to drink fluids. °· If you have an appetite, eat a normal diet unless your caregiver tells you differently. °¨ Eat a variety of complex carbohydrates (rice, wheat, potatoes, bread), lean meats, yogurt, fruits, and vegetables. °¨ Avoid high-fat foods because they are more difficult to digest. °· Drink enough water and fluids to keep your urine clear or pale yellow. °· If you are dehydrated, ask your caregiver for specific rehydration instructions. Signs of dehydration may include: °¨ Severe thirst. °¨ Dry lips and mouth. °¨ Dizziness. °¨ Dark urine. °¨ Decreasing urine frequency and amount. °¨ Confusion. °¨ Rapid breathing or pulse. °SEEK IMMEDIATE MEDICAL CARE IF:  °· You have blood or brown flecks (like coffee grounds) in your vomit. °· You have black or bloody stools. °· You have a severe headache or stiff neck. °· You are confused. °· You have severe abdominal pain. °· You have chest pain or trouble breathing. °· You do not urinate at least once every 8 hours. °· You develop cold or clammy skin. °· You continue to vomit for longer than 24 to 48 hours. °· You have a fever. °MAKE SURE YOU:  °· Understand these instructions. °· Will watch your condition. °· Will get help right away if you are not doing well or get worse. °Document Released: 10/17/2005 Document Revised: 01/09/2012 Document Reviewed: 03/16/2011 °[employer]® Patient Information ©2015 [employer]. This information is not  intended to replace advice given to you by your health care provider. Make sure you discuss any questions you have with your health care provider. ° °

## 2015-03-02 ENCOUNTER — Encounter: Payer: Self-pay | Admitting: Internal Medicine

## 2015-04-28 ENCOUNTER — Ambulatory Visit (INDEPENDENT_AMBULATORY_CARE_PROVIDER_SITE_OTHER): Payer: Medicare HMO | Admitting: Internal Medicine

## 2015-04-28 ENCOUNTER — Encounter: Payer: Self-pay | Admitting: Internal Medicine

## 2015-04-28 VITALS — BP 108/68 | HR 76 | Ht 66.0 in | Wt 165.0 lb

## 2015-04-28 DIAGNOSIS — Z9889 Other specified postprocedural states: Secondary | ICD-10-CM

## 2015-04-28 DIAGNOSIS — K589 Irritable bowel syndrome without diarrhea: Secondary | ICD-10-CM

## 2015-04-28 DIAGNOSIS — Z8719 Personal history of other diseases of the digestive system: Secondary | ICD-10-CM

## 2015-04-28 DIAGNOSIS — Z1211 Encounter for screening for malignant neoplasm of colon: Secondary | ICD-10-CM

## 2015-04-28 DIAGNOSIS — K219 Gastro-esophageal reflux disease without esophagitis: Secondary | ICD-10-CM

## 2015-04-28 NOTE — Progress Notes (Signed)
HISTORY OF PRESENT ILLNESS:  [name] is a 59 y.o. female with a history of GERD, IBS, bipolar disorder, chronic headaches, and prior stroke. She was initially evaluated 09/09/2014 regarding recurrent problems with nausea, vomiting, and epigastric pain. See that dictation for details. Upper GI series revealed a large paraesophageal hernia. She was referred to general surgery for repair. However, while visiting her family in [place], developed acute symptoms and on 09/23/2014 underwent laparoscopic paraesophageal hernia repair with bio-A mesh and gastropexy. She recovered uneventfully. She tells me that problems with nausea, vomiting, and pain all resolved. She was able to eat much better without negative effects. Actually was doing quite well until 02/25/2015 when she presented to the hospital with acute onset epigastric pain, nausea, and vomiting. She was noted to have leukocytosis. CT scan was negative. She had been treated just prior to that with antibiotics for respiratory illness. Problems resolved shortly after emergency room visit. No recurrence. She was told to follow-up with GI. Last GI evaluations according to her were around 2003 or 2004 including unremarkable colonoscopy. The patient does have promethazine for nausea should she require such. She does take pantoprazole daily for known GERD. As well, dicyclomine for her IBS with alternating bowel habits. She denies bleeding. She has gained 6 pounds since her last office evaluation. She continues to use cannabis regularly.  REVIEW OF SYSTEMS:  All non-GI ROS negative except for sinus allergy, anxiety, back pain, cough, depression, fatigue, headaches, muscle cramps, night sweats, ankle edema  Past Medical History  Diagnosis Date  . Hiatal hernia   . IBS (irritable bowel syndrome)   . Diverticulosis   . Bipolar affective disorder   . GERD (gastroesophageal reflux disease)   . Chronic headaches   . Depression   . Stroke   .  Endometriosis     Past Surgical History  Procedure Laterality Date  . Abdominal hysterectomy  1990  . Breast lumpectomy Bilateral   . Foot fracture surgery Right     x 3  . Toe surgery Left   . Hiatal hernia repair      Social History [name]  reports that she has never smoked. She has never used smokeless tobacco. She reports that she uses illicit drugs (Marijuana). She reports that she does not drink alcohol.  family history includes Brain cancer in her mother; Breast cancer in her paternal grandmother and sister; Diabetes in her mother and sister.  No Known Allergies     PHYSICAL EXAMINATION: Vital signs: BP 108/68 mmHg  Pulse 76  Ht 5\' 6"  (1.676 m)  Wt 165 lb (74.844 kg)  BMI 26.64 kg/m2 General: Well-developed, well-nourished, no acute distress HEENT: Sclerae are anicteric, conjunctiva pink. Oral mucosa intact Lungs: Clear Heart: Regular Abdomen: soft, nontender, nondistended, no obvious ascites, no peritoneal signs, normal bowel sounds. No organomegaly. Extremities: No edema Psychiatric: alert and oriented x3. Cooperative   ASSESSMENT:  #1. History of symptomatic paraesophageal hernia. Status post repair with resolution of symptoms. #2. Recent emergency room visit 02/25/2015 with abdominal pain, nausea, and vomiting. Negative workup except for leukocytosis. Quick resolution without recurrence. Suspect acute gastroenteritis #3. GERD. Asymptomatic on PPI #4. IBS. Ongoing #5. Colon cancer screening. Apparently had last colonoscopy 2003 or 2004, which was negative. I reviewed with her current guidelines for screening and surveillance.    PLAN:  #1. Reflux precautions #2. Continue PPI to control classic reflux symptoms #3. Recommended repeat screening colonoscopy or cologuard. Carefully reviewed guidelines and rationale for recommendations. She has  decided against proceeding at this time but said that she would contact the office should she change her  mind. #4. As such, resume general medical care with PCP. GI follow-up as needed

## 2015-04-28 NOTE — Patient Instructions (Signed)
Follow up as needed with Dr Perry 

## 2015-07-17 ENCOUNTER — Emergency Department (HOSPITAL_COMMUNITY): Payer: Medicare HMO

## 2015-07-17 ENCOUNTER — Encounter (HOSPITAL_COMMUNITY): Payer: Self-pay | Admitting: *Deleted

## 2015-07-17 ENCOUNTER — Observation Stay (HOSPITAL_COMMUNITY)
Admission: EM | Admit: 2015-07-17 | Discharge: 2015-07-18 | Disposition: A | Discharge status: Home / Self Care | Payer: Medicare HMO | Attending: Internal Medicine | Admitting: Internal Medicine

## 2015-07-17 DIAGNOSIS — Z7982 Long term (current) use of aspirin: Secondary | ICD-10-CM | POA: Diagnosis not present

## 2015-07-17 DIAGNOSIS — Z79899 Other long term (current) drug therapy: Secondary | ICD-10-CM | POA: Insufficient documentation

## 2015-07-17 DIAGNOSIS — Z8673 Personal history of transient ischemic attack (TIA), and cerebral infarction without residual deficits: Secondary | ICD-10-CM | POA: Insufficient documentation

## 2015-07-17 DIAGNOSIS — G43A1 Cyclical vomiting, intractable: Secondary | ICD-10-CM

## 2015-07-17 DIAGNOSIS — K589 Irritable bowel syndrome without diarrhea: Secondary | ICD-10-CM | POA: Diagnosis not present

## 2015-07-17 DIAGNOSIS — F3170 Bipolar disorder, currently in remission, most recent episode unspecified: Secondary | ICD-10-CM | POA: Diagnosis not present

## 2015-07-17 DIAGNOSIS — Z791 Long term (current) use of non-steroidal anti-inflammatories (NSAID): Secondary | ICD-10-CM | POA: Insufficient documentation

## 2015-07-17 DIAGNOSIS — R109 Unspecified abdominal pain: Secondary | ICD-10-CM | POA: Diagnosis not present

## 2015-07-17 DIAGNOSIS — K219 Gastro-esophageal reflux disease without esophagitis: Secondary | ICD-10-CM | POA: Insufficient documentation

## 2015-07-17 DIAGNOSIS — Z7951 Long term (current) use of inhaled steroids: Secondary | ICD-10-CM | POA: Diagnosis not present

## 2015-07-17 DIAGNOSIS — D72829 Elevated white blood cell count, unspecified: Secondary | ICD-10-CM | POA: Insufficient documentation

## 2015-07-17 DIAGNOSIS — F1410 Cocaine abuse, uncomplicated: Secondary | ICD-10-CM | POA: Insufficient documentation

## 2015-07-17 DIAGNOSIS — R112 Nausea with vomiting, unspecified: Secondary | ICD-10-CM | POA: Diagnosis present

## 2015-07-17 DIAGNOSIS — G43A0 Cyclical vomiting, not intractable: Secondary | ICD-10-CM | POA: Diagnosis not present

## 2015-07-17 DIAGNOSIS — F1210 Cannabis abuse, uncomplicated: Secondary | ICD-10-CM | POA: Insufficient documentation

## 2015-07-17 DIAGNOSIS — R1115 Cyclical vomiting syndrome unrelated to migraine: Secondary | ICD-10-CM | POA: Diagnosis present

## 2015-07-17 DIAGNOSIS — F319 Bipolar disorder, unspecified: Secondary | ICD-10-CM | POA: Diagnosis not present

## 2015-07-17 DIAGNOSIS — F1420 Cocaine dependence, uncomplicated: Secondary | ICD-10-CM | POA: Diagnosis present

## 2015-07-17 HISTORY — DX: Cyclical vomiting syndrome unrelated to migraine: R11.15

## 2015-07-17 HISTORY — DX: Unspecified abdominal pain: R10.9

## 2015-07-17 LAB — CBC
HCT: 44.8 % (ref 36.0–46.0)
Hemoglobin: 15.1 g/dL — ABNORMAL HIGH (ref 12.0–15.0)
MCH: 33 pg (ref 26.0–34.0)
MCHC: 33.7 g/dL (ref 30.0–36.0)
MCV: 97.8 fL (ref 78.0–100.0)
PLATELETS: 296 10*3/uL (ref 150–400)
RBC: 4.58 MIL/uL (ref 3.87–5.11)
RDW: 13.5 % (ref 11.5–15.5)
WBC: 14.2 10*3/uL — ABNORMAL HIGH (ref 4.0–10.5)

## 2015-07-17 LAB — COMPREHENSIVE METABOLIC PANEL
ALT: 13 U/L — ABNORMAL LOW (ref 14–54)
ANION GAP: 11 (ref 5–15)
AST: 22 U/L (ref 15–41)
Albumin: 4.6 g/dL (ref 3.5–5.0)
Alkaline Phosphatase: 95 U/L (ref 38–126)
BUN: 16 mg/dL (ref 6–20)
CO2: 24 mmol/L (ref 22–32)
Calcium: 9.5 mg/dL (ref 8.9–10.3)
Chloride: 108 mmol/L (ref 101–111)
Creatinine, Ser: 0.62 mg/dL (ref 0.44–1.00)
GFR calc non Af Amer: >60 mL/min (ref >60)
Glucose, Bld: 125 mg/dL — ABNORMAL HIGH (ref 65–99)
Potassium: 3.5 mmol/L (ref 3.5–5.1)
SODIUM: 143 mmol/L (ref 135–145)
Total Bilirubin: 0.6 mg/dL (ref 0.3–1.2)
Total Protein: 7.7 g/dL (ref 6.5–8.1)

## 2015-07-17 LAB — RAPID URINE DRUG SCREEN, HOSP PERFORMED
Amphetamines: NOT DETECTED
BARBITURATES: NOT DETECTED
Benzodiazepines: NOT DETECTED
Cocaine: NOT DETECTED
Opiates: NOT DETECTED
Tetrahydrocannabinol: POSITIVE — AB

## 2015-07-17 LAB — CBC WITH DIFFERENTIAL/PLATELET
BASOS PCT: 0 %
Basophils Absolute: 0 10*3/uL (ref 0.0–0.1)
EOS ABS: 0 10*3/uL (ref 0.0–0.7)
Eosinophils Relative: 0 %
HCT: 43.2 % (ref 36.0–46.0)
HEMOGLOBIN: 15.1 g/dL — AB (ref 12.0–15.0)
Lymphocytes Relative: 8 %
Lymphs Abs: 1.2 10*3/uL (ref 0.7–4.0)
MCH: 33.9 pg (ref 26.0–34.0)
MCHC: 35 g/dL (ref 30.0–36.0)
MCV: 97.1 fL (ref 78.0–100.0)
MONOS PCT: 2 %
Monocytes Absolute: 0.4 10*3/uL (ref 0.1–1.0)
Neutro Abs: 14.7 10*3/uL — ABNORMAL HIGH (ref 1.7–7.7)
Neutrophils Relative %: 90 %
PLATELETS: 299 10*3/uL (ref 150–400)
RBC: 4.45 MIL/uL (ref 3.87–5.11)
RDW: 13.4 % (ref 11.5–15.5)
WBC: 16.3 10*3/uL — AB (ref 4.0–10.5)

## 2015-07-17 LAB — URINALYSIS, ROUTINE W REFLEX MICROSCOPIC
Bilirubin Urine: NEGATIVE
GLUCOSE, UA: NEGATIVE mg/dL
KETONES UR: NEGATIVE mg/dL
Leukocytes, UA: NEGATIVE
Nitrite: NEGATIVE
PROTEIN: NEGATIVE mg/dL
Specific Gravity, Urine: 1.038 — ABNORMAL HIGH (ref 1.005–1.030)
Urobilinogen, UA: 0.2 mg/dL (ref 0.0–1.0)
pH: 8.5 — ABNORMAL HIGH (ref 5.0–8.0)

## 2015-07-17 LAB — URINE MICROSCOPIC-ADD ON

## 2015-07-17 LAB — LIPASE, BLOOD: Lipase: 11 U/L — ABNORMAL LOW (ref 22–51)

## 2015-07-17 LAB — TSH: TSH: 1.62 u[IU]/mL (ref 0.350–4.500)

## 2015-07-17 LAB — LACTIC ACID, PLASMA
LACTIC ACID, VENOUS: 1 mmol/L (ref 0.5–2.0)
Lactic Acid, Venous: 0.9 mmol/L (ref 0.5–2.0)

## 2015-07-17 LAB — MAGNESIUM: MAGNESIUM: 1.6 mg/dL — AB (ref 1.7–2.4)

## 2015-07-17 LAB — CREATININE, SERUM
CREATININE: 0.66 mg/dL (ref 0.44–1.00)
GFR calc Af Amer: >60 mL/min (ref >60)
GFR calc non Af Amer: >60 mL/min (ref >60)

## 2015-07-17 LAB — TROPONIN I: Troponin I: <0.03 ng/mL (ref <0.031)

## 2015-07-17 MED ORDER — CYCLOBENZAPRINE HCL 10 MG PO TABS: 10.0000 mg | ORAL_TABLET | Freq: Three times a day (TID) | ORAL | Status: DC | PRN

## 2015-07-17 MED ORDER — FAMOTIDINE IN NACL 20-0.9 MG/50ML-% IV SOLN
20.0000 mg | Freq: Once | INTRAVENOUS | Status: AC
  Administered 2015-07-17: 20 mg via INTRAVENOUS
  Filled 2015-07-17: qty 50

## 2015-07-17 MED ORDER — LORATADINE 10 MG PO TABS
10.0000 mg | ORAL_TABLET | Freq: Every day | ORAL | Status: DC
  Administered 2015-07-17 – 2015-07-18 (×2): 10 mg via ORAL
  Filled 2015-07-17 (×2): qty 1

## 2015-07-17 MED ORDER — ONDANSETRON HCL 4 MG PO TABS
4.0000 mg | ORAL_TABLET | Freq: Four times a day (QID) | ORAL | Status: DC | PRN
  Administered 2015-07-18: 4 mg via ORAL
  Filled 2015-07-17: qty 1

## 2015-07-17 MED ORDER — ONDANSETRON HCL 4 MG/2ML IJ SOLN
4.0000 mg | Freq: Four times a day (QID) | INTRAMUSCULAR | Status: DC | PRN
  Administered 2015-07-17: 4 mg via INTRAVENOUS

## 2015-07-17 MED ORDER — PROMETHAZINE HCL 25 MG PO TABS: 25.0000 mg | ORAL_TABLET | Freq: Four times a day (QID) | ORAL | Status: DC | PRN

## 2015-07-17 MED ORDER — ACETAMINOPHEN 650 MG RE SUPP
650.0000 mg | Freq: Four times a day (QID) | RECTAL | Status: DC | PRN
  Administered 2015-07-17: 650 mg via RECTAL
  Filled 2015-07-17: qty 1

## 2015-07-17 MED ORDER — GABAPENTIN 300 MG PO CAPS: 900.0000 mg | ORAL_CAPSULE | Freq: Three times a day (TID) | ORAL | Status: DC | PRN

## 2015-07-17 MED ORDER — SODIUM CHLORIDE 0.9 % IV SOLN
INTRAVENOUS | Status: DC
  Administered 2015-07-17 – 2015-07-18 (×2): via INTRAVENOUS

## 2015-07-17 MED ORDER — PROMETHAZINE HCL 25 MG/ML IJ SOLN
25.0000 mg | Freq: Once | INTRAMUSCULAR | Status: AC
  Administered 2015-07-17: 25 mg via INTRAMUSCULAR
  Filled 2015-07-17: qty 1

## 2015-07-17 MED ORDER — PROMETHAZINE HCL 25 MG RE SUPP: 25.0000 mg | Freq: Four times a day (QID) | RECTAL | Status: DC | PRN

## 2015-07-17 MED ORDER — FLUTICASONE PROPIONATE 50 MCG/ACT NA SUSP
2.0000 | Freq: Every day | NASAL | Status: DC | PRN
  Filled 2015-07-17: qty 16

## 2015-07-17 MED ORDER — BOOST / RESOURCE BREEZE PO LIQD
1.0000 | Freq: Three times a day (TID) | ORAL | Status: DC
  Administered 2015-07-17: 1 via ORAL

## 2015-07-17 MED ORDER — FENTANYL CITRATE (PF) 100 MCG/2ML IJ SOLN
100.0000 ug | INTRAMUSCULAR | Status: DC | PRN
  Administered 2015-07-17: 100 ug via INTRAVENOUS
  Filled 2015-07-17 (×2): qty 2

## 2015-07-17 MED ORDER — DICYCLOMINE HCL 20 MG PO TABS
20.0000 mg | ORAL_TABLET | Freq: Three times a day (TID) | ORAL | Status: DC
  Administered 2015-07-17 – 2015-07-18 (×2): 20 mg via ORAL
  Filled 2015-07-17 (×7): qty 1

## 2015-07-17 MED ORDER — FENTANYL CITRATE (PF) 100 MCG/2ML IJ SOLN
100.0000 ug | INTRAMUSCULAR | Status: DC | PRN
  Administered 2015-07-17: 100 ug via INTRAVENOUS

## 2015-07-17 MED ORDER — VENLAFAXINE HCL ER 75 MG PO CP24
75.0000 mg | ORAL_CAPSULE | Freq: Every day | ORAL | Status: DC
  Administered 2015-07-17 – 2015-07-18 (×2): 75 mg via ORAL
  Filled 2015-07-17 (×2): qty 1

## 2015-07-17 MED ORDER — IOHEXOL 300 MG/ML  SOLN
100.0000 mL | Freq: Once | INTRAMUSCULAR | Status: AC | PRN
  Administered 2015-07-17: 100 mL via INTRAVENOUS

## 2015-07-17 MED ORDER — PANTOPRAZOLE SODIUM 40 MG IV SOLR
40.0000 mg | Freq: Two times a day (BID) | INTRAVENOUS | Status: DC
  Administered 2015-07-17 – 2015-07-18 (×2): 40 mg via INTRAVENOUS
  Filled 2015-07-17 (×2): qty 40

## 2015-07-17 MED ORDER — ONDANSETRON HCL 4 MG/2ML IJ SOLN
4.0000 mg | INTRAMUSCULAR | Status: AC | PRN
  Administered 2015-07-17 (×2): 4 mg via INTRAVENOUS
  Filled 2015-07-17 (×3): qty 2

## 2015-07-17 MED ORDER — PANTOPRAZOLE SODIUM 40 MG PO TBEC: 40.0000 mg | DELAYED_RELEASE_TABLET | Freq: Every day | ORAL | Status: DC

## 2015-07-17 MED ORDER — SODIUM CHLORIDE 0.9 % IJ SOLN
3.0000 mL | Freq: Two times a day (BID) | INTRAMUSCULAR | Status: DC
  Administered 2015-07-17 (×2): 3 mL via INTRAVENOUS

## 2015-07-17 MED ORDER — PRAMIPEXOLE DIHYDROCHLORIDE 0.125 MG PO TABS
0.1250 mg | ORAL_TABLET | Freq: Every day | ORAL | Status: DC
  Administered 2015-07-17: 0.125 mg via ORAL
  Filled 2015-07-17 (×2): qty 1

## 2015-07-17 MED ORDER — LAMOTRIGINE 100 MG PO TABS
150.0000 mg | ORAL_TABLET | Freq: Every day | ORAL | Status: DC
  Administered 2015-07-17: 150 mg via ORAL
  Filled 2015-07-17: qty 1.5

## 2015-07-17 MED ORDER — ACETAMINOPHEN 325 MG PO TABS: 650.0000 mg | ORAL_TABLET | Freq: Four times a day (QID) | ORAL | Status: DC | PRN

## 2015-07-17 MED ORDER — IOHEXOL 300 MG/ML  SOLN
25.0000 mL | Freq: Once | INTRAMUSCULAR | Status: AC | PRN
  Administered 2015-07-17: 25 mL via ORAL

## 2015-07-17 MED ORDER — ENOXAPARIN SODIUM 40 MG/0.4ML ~~LOC~~ SOLN
40.0000 mg | SUBCUTANEOUS | Status: DC
  Administered 2015-07-17: 40 mg via SUBCUTANEOUS
  Filled 2015-07-17 (×2): qty 0.4

## 2015-07-17 MED ORDER — LEVALBUTEROL HCL 0.63 MG/3ML IN NEBU: 0.6300 mg | INHALATION_SOLUTION | Freq: Four times a day (QID) | RESPIRATORY_TRACT | Status: DC | PRN

## 2015-07-17 MED ORDER — ASPIRIN EC 325 MG PO TBEC
325.0000 mg | DELAYED_RELEASE_TABLET | Freq: Every day | ORAL | Status: DC
  Administered 2015-07-17 – 2015-07-18 (×2): 325 mg via ORAL
  Filled 2015-07-17 (×2): qty 1

## 2015-07-17 NOTE — ED Notes (Signed)
Bed: WA22 Expected date:  Expected time:  Means of arrival:  Comments: ems 

## 2015-07-17 NOTE — ED Notes (Signed)
Pt is aware we need a urine sample. Pt states she doubts she will be able to void because she is so dehydrated. Asked pt to notify when she is able to void

## 2015-07-17 NOTE — ED Notes (Signed)
While hooking pt to heart monitor, pt started actively vomiting.

## 2015-07-17 NOTE — ED Provider Notes (Signed)
CSN: [id number]     Arrival date & time 07/17/15  1132 History   First MD Initiated Contact with Patient 07/17/15 1150     Chief Complaint  Patient presents with  . Nausea  . Emesis  . Abdominal Pain    HPI Pt was seen at 1155.  Per pt, c/o gradual onset and persistence of constant upper abd "pain" since 0530 today.  Has been associated with multiple intermittent episodes of N/V.  Describes the abd pain as per her usual chronic recurrent pain pattern.  States she took a phenergan PR this morning without improvement.  Denies diarrhea, no fevers, no back pain, no rash, no CP/SOB, no black or blood in stools or emesis. The symptoms have been associated with no other complaints. The patient has a significant history of similar symptoms previously, recently being evaluated for this complaint and multiple prior evals for same.      Past Medical History  Diagnosis Date  . Hiatal hernia   . IBS (irritable bowel syndrome)   . Diverticulosis   . Bipolar affective disorder   . GERD (gastroesophageal reflux disease)   . Chronic headaches   . Depression   . Stroke   . Endometriosis   . Cyclical vomiting syndrome   . Abdominal pain     chronic, recurrent   Past Surgical History  Procedure Laterality Date  . Abdominal hysterectomy  1990  . Breast lumpectomy Bilateral   . Foot fracture surgery Right     x 3  . Toe surgery Left   . Hiatal hernia repair     Family History  Problem Relation Age of Onset  . Brain cancer Mother   . Breast cancer Sister   . Breast cancer Paternal Grandmother   . Diabetes Mother   . Diabetes Sister     x 2   Social History  Substance Use Topics  . Smoking status: Never Smoker   . Smokeless tobacco: Never Used  . Alcohol Use: No    Review of Systems ROS: Statement: All systems negative except as marked or noted in the HPI; Constitutional: Negative for fever and chills. ; ; Eyes: Negative for eye pain, redness and discharge. ; ; ENMT: Negative for ear  pain, hoarseness, nasal congestion, sinus pressure and sore throat. ; ; Cardiovascular: Negative for chest pain, palpitations, diaphoresis, dyspnea and peripheral edema. ; ; Respiratory: Negative for cough, wheezing and stridor. ; ; Gastrointestinal: +N/V, abd pain. Negative for diarrhea, blood in stool, hematemesis, jaundice and rectal bleeding. . ; ; Genitourinary: Negative for dysuria, flank pain and hematuria. ; ; Musculoskeletal: Negative for back pain and neck pain. Negative for swelling and trauma.; ; Skin: Negative for pruritus, rash, abrasions, blisters, bruising and skin lesion.; ; Neuro: Negative for headache, lightheadedness and neck stiffness. Negative for weakness, altered level of consciousness , altered mental status, extremity weakness, paresthesias, involuntary movement, seizure and syncope.       Allergies  Review of patient's allergies indicates no known allergies.  Home Medications   Prior to Admission medications   Medication Sig Start Date End Date Taking? Authorizing Provider  aspirin EC 325 MG tablet Take 325 mg by mouth daily.    Historical Provider, MD  cyclobenzaprine (FLEXERIL) 10 MG tablet Take 10 mg by mouth 3 (three) times daily as needed for muscle spasms. For muscle spasms in back    Historical Provider, MD  diclofenac sodium (VOLTAREN) 1 % GEL Apply 1 g topically 3 (three) times daily as  needed (back and elbow pain .). To back and elbow    Historical Provider, MD  dicyclomine (BENTYL) 20 MG tablet Take 20 mg by mouth 3 (three) times daily as needed. For intestinal spasms    Historical Provider, MD  fluticasone (FLONASE) 50 MCG/ACT nasal spray Place 2 sprays into the nose daily.    Historical Provider, MD  gabapentin (NEURONTIN) 300 MG capsule Take 3 capsules by mouth 3 (three) times daily. 04/23/15   Historical Provider, MD  lamoTRIgine (LAMICTAL) 150 MG tablet Take 150 mg by mouth daily.     Historical Provider, MD  loratadine (CLARITIN) 10 MG tablet Take 1  tablet by mouth daily. 03/25/15   Historical Provider, MD  pantoprazole (PROTONIX) 40 MG tablet TAKE 1 TABLET BY MOUTH EVERY DAY 01/12/15   Irene Shipper, MD  perphenazine (TRILAFON) 4 MG tablet Take 4 mg by mouth 3 (three) times daily.     Historical Provider, MD  pramipexole (MIRAPEX) 0.125 MG tablet Take 0.125 mg by mouth at bedtime.    Historical Provider, MD  promethazine (PHENERGAN) 25 MG suppository Place 25 mg rectally every 6 (six) hours as needed for nausea or vomiting.    Historical Provider, MD  promethazine (PHENERGAN) 25 MG tablet Take 1 tablet (25 mg total) by mouth every 6 (six) hours as needed for nausea or vomiting. 04/03/14   Kristen N Ward, DO  venlafaxine XR (EFFEXOR-XR) 75 MG 24 hr capsule Take 75 mg by mouth daily.    Historical Provider, MD   BP 185/102 mmHg  Pulse 75  Temp(Src) 97.7 F (36.5 C) (Oral)  Resp 18  SpO2 96% Physical Exam 1200: Physical examination:  Nursing notes reviewed; Vital signs and O2 SAT reviewed;  Constitutional: Well developed, Well nourished, Well hydrated, Uncomfortable appearing.; Head:  Normocephalic, atraumatic; Eyes: EOMI, PERRL, No scleral icterus; ENMT: Mouth and pharynx normal, Mucous membranes moist; Neck: Supple, Full range of motion, No lymphadenopathy; Cardiovascular: Regular rate and rhythm, No gallop; Respiratory: Breath sounds clear & equal bilaterally, No wheezes.  Speaking full sentences with ease, Normal respiratory effort/excursion; Chest: Nontender, Movement normal; Abdomen: Soft, +mid-epigastric tenderness to palp. No rebound or guarding. Nondistended, Normal bowel sounds; Genitourinary: No CVA tenderness; Extremities: Pulses normal, No tenderness, No edema, No calf edema or asymmetry.; Neuro: AA&Ox3, Major CN grossly intact.  Speech clear. No gross focal motor or sensory deficits in extremities.; Skin: Color normal, Warm, Dry.   ED Course  Procedures (including critical care time) Labs Review   Imaging Review  I have  personally reviewed and evaluated these images and lab results as part of my medical decision-making.   EKG Interpretation None      MDM  MDM Reviewed: previous chart, nursing note and vitals Reviewed previous: labs and ECG Interpretation: labs, x-ray, CT scan and ECG      Results for orders placed or performed during the hospital encounter of 07/17/15  Comprehensive metabolic panel  Result Value Ref Range   Sodium 143 135 - 145 mmol/L   Potassium 3.5 3.5 - 5.1 mmol/L   Chloride 108 101 - 111 mmol/L   CO2 24 22 - 32 mmol/L   Glucose, Bld 125 (H) 65 - 99 mg/dL   BUN 16 6 - 20 mg/dL   Creatinine, Ser 0.62 0.44 - 1.00 mg/dL   Calcium 9.5 8.9 - 10.3 mg/dL   Total Protein 7.7 6.5 - 8.1 g/dL   Albumin 4.6 3.5 - 5.0 g/dL   AST 22 15 - 41 U/L  ALT 13 (L) 14 - 54 U/L   Alkaline Phosphatase 95 38 - 126 U/L   Total Bilirubin 0.6 0.3 - 1.2 mg/dL   GFR calc non Af Amer >60 >60 mL/min   GFR calc Af Amer >60 >60 mL/min   Anion gap 11 5 - 15  Lipase, blood  Result Value Ref Range   Lipase 11 (L) 22 - 51 U/L  Troponin I  Result Value Ref Range   Troponin I <0.03 <0.031 ng/mL  Lactic acid, plasma  Result Value Ref Range   Lactic Acid, Venous 1.0 0.5 - 2.0 mmol/L  CBC with Differential  Result Value Ref Range   WBC 16.3 (H) 4.0 - 10.5 K/uL   RBC 4.45 3.87 - 5.11 MIL/uL   Hemoglobin 15.1 (H) 12.0 - 15.0 g/dL   HCT 43.2 36.0 - 46.0 %   MCV 97.1 78.0 - 100.0 fL   MCH 33.9 26.0 - 34.0 pg   MCHC 35.0 30.0 - 36.0 g/dL   RDW 13.4 11.5 - 15.5 %   Platelets 299 150 - 400 K/uL   Neutrophils Relative % 90 %   Neutro Abs 14.7 (H) 1.7 - 7.7 K/uL   Lymphocytes Relative 8 %   Lymphs Abs 1.2 0.7 - 4.0 K/uL   Monocytes Relative 2 %   Monocytes Absolute 0.4 0.1 - 1.0 K/uL   Eosinophils Relative 0 %   Eosinophils Absolute 0.0 0.0 - 0.7 K/uL   Basophils Relative 0 %   Basophils Absolute 0.0 0.0 - 0.1 K/uL  Urinalysis, Routine w reflex microscopic  Result Value Ref Range   Color, Urine  YELLOW YELLOW   APPearance CLOUDY (A) CLEAR   Specific Gravity, Urine 1.038 (H) 1.005 - 1.030   pH 8.5 (H) 5.0 - 8.0   Glucose, UA NEGATIVE NEGATIVE mg/dL   Hgb urine dipstick TRACE (A) NEGATIVE   Bilirubin Urine NEGATIVE NEGATIVE   Ketones, ur NEGATIVE NEGATIVE mg/dL   Protein, ur NEGATIVE NEGATIVE mg/dL   Urobilinogen, UA 0.2 0.0 - 1.0 mg/dL   Nitrite NEGATIVE NEGATIVE   Leukocytes, UA NEGATIVE NEGATIVE  Urine microscopic-add on  Result Value Ref Range   Squamous Epithelial / LPF RARE RARE   RBC / HPF 0-2 <3 RBC/hpf   Bacteria, UA RARE RARE   Urine-Other AMORPHOUS URATES/PHOSPHATES    Dg Chest 2 View 07/17/2015   CLINICAL DATA:  Nausea vomiting abdominal pain since 05/30 this morning  EXAM: CHEST  2 VIEW  COMPARISON:  09/14/2014  FINDINGS: Heart size and vascular pattern are normal. Lungs are clear. No effusions.  IMPRESSION: No acute findings.   Electronically Signed   By: Skipper Cliche M.D.   On: 07/17/2015 13:14   Ct Abdomen Pelvis W Contrast 07/17/2015   CLINICAL DATA:  Acute abdominal pain with nausea and vomiting. Prior hiatal hernia repair and hysterectomy.  EXAM: CT ABDOMEN AND PELVIS WITH CONTRAST  TECHNIQUE: Multidetector CT imaging of the abdomen and pelvis was performed using the standard protocol following bolus administration of intravenous contrast.  CONTRAST:  163mL OMNIPAQUE IOHEXOL 300 MG/ML SOLN, 18mL OMNIPAQUE IOHEXOL 300 MG/ML SOLN  COMPARISON:  02/25/2015  FINDINGS: Lower chest: Stable small 4 mm inferior right middle lobe nodule, image 2. Minor right base atelectasis versus scarring. Lung bases otherwise clear. No lower lobe pneumonia. Normal heart size. No pericardial or pleural effusion. Postop changes at the GE junction from hiatal hernia repair. No recurrent hernia evident.  Stable 9 mm hypodense cyst in the left hepatic lobe lateral  segment, image 20. No other hepatic abnormality or biliary dilatation. Patent portal vein. Gallbladder, biliary system, pancreas,  spleen, adrenal glands, and kidneys are within normal limits for age and demonstrate no acute process. Incidental stable punctate tiny nonobstructing calculi in the left kidney lower pole collecting system.  Negative for bowel obstruction, dilatation, ileus, or free air.  No abdominal free fluid, fluid collection, hemorrhage, abscess, or adenopathy. Aortoiliac atherosclerosis noted without aneurysm or occlusive process. No retroperitoneal abnormality.  Scattered diverticulosis throughout the colon. Colon is decompressed accounting for the slight wall prominence. Several hyperdense appendicoliths noted. No definite surrounding inflammatory process. Terminal ileum unremarkable.  Pelvis: Prior hysterectomy noted. Urinary bladder unremarkable. Diverticulosis of the distal colon. No pelvic free fluid, fluid collection, hemorrhage, abscess, adenopathy, inguinal abnormality, or hernia.  no acute osseous finding.  IMPRESSION: Prior hiatal hernia repair and hysterectomy.  Stable 9 mm left hepatic hypodense cyst  Punctate nonobstructing left nephrolithiasis  Scattered colonic diverticulosis  Several hyperdense appendicoliths but no signs of acute appendicitis  Aortoiliac atherosclerosis without aneurysm   Electronically Signed   By: Jerilynn Mages.  Shick M.D.   On: 07/17/2015 14:16    1505:  Pt unable to tol PO without N/V despite multiple doses IV meds for same. Dx and testing d/w pt.  Questions answered.  Verb understanding, agreeable to admit. T/C to [facility] Dr. Allyson Sabal, case discussed, including:  HPI, pertinent PM/SHx, VS/PE, dx testing, ED course and treatment:  Agreeable to admit, requests to write temporary orders, obtain meds bed to team [facility]Admits.   Francine Graven, DO 07/18/15 2014

## 2015-07-17 NOTE — ED Notes (Signed)
Pt can go at 17:28.

## 2015-07-17 NOTE — ED Notes (Signed)
Pt transported to CT ?

## 2015-07-17 NOTE — H&P (Signed)
[facility] History and Physical  [name] DGU:[id number] DOB: [birth year] DOA: 07/17/2015  Referring physician:   PCP: Vonna Drafts., FNP   Chief Complaint: Intractable nausea vomiting   HPI:  59 year old female with a history of gastroesophageal reflux disease, IBS, bipolar disorder, prior CVA, status post laparoscopic paraesophageal hernia repair in November 2015, substance abuse with prior history of cocaine abuse and daily marijuana use who presents to the ER with intractable nausea vomiting that started last night. She also rated her abdominal pain as being 7 out of 10 this morning. Patient continued to  have nausea vomiting in the ER despite several doses of IV Zofran, IV Phenergan. UDS positive for marijuana..  Patient has had previous extensive workup including an admission in April 2016 when she presented to the hospital with epigastric pain nausea vomiting, leukocytosis and negative CT scan. Patient has exact same presentation this time. She saw Dr. Henrene Pastor on 9/14 and was offered a colonoscopy but she declined. Previous GI evaluations included unremarkable colonoscopy in 2003 and 2004. Patient is on chronic promethazine , Bentyl, Phenergan, at home.     Review of Systems: negative for the following  Constitutional: Denies fever, chills, diaphoresis, appetite change and fatigue.  HEENT: Denies photophobia, eye pain, redness, hearing loss, ear pain, congestion, sore throat, rhinorrhea, sneezing, mouth sores, trouble swallowing, neck pain, neck stiffness and tinnitus.  Respiratory: Denies SOB, DOE, cough, chest tightness, and wheezing.  Cardiovascular: Denies chest pain, palpitations and leg swelling.  Gastrointestinal: Positive for nausea vomiting, abdominal pain, diarrhea, constipation, blood in stool and abdominal distention.  Genitourinary: Denies dysuria, urgency, frequency, hematuria, flank pain and difficulty urinating.  Musculoskeletal: Denies myalgias, back  pain, joint swelling, arthralgias and gait problem.  Skin: Denies pallor, rash and wound.  Neurological: Denies dizziness, seizures, syncope, weakness, light-headedness, numbness and headaches.  Hematological: Denies adenopathy. Easy bruising, personal or family bleeding history  Psychiatric/Behavioral: Denies suicidal ideation, mood changes, confusion, nervousness, sleep disturbance and agitation       Past Medical History  Diagnosis Date  . Hiatal hernia   . IBS (irritable bowel syndrome)   . Diverticulosis   . Bipolar affective disorder   . GERD (gastroesophageal reflux disease)   . Chronic headaches   . Depression   . Stroke   . Endometriosis   . Cyclical vomiting syndrome   . Abdominal pain     chronic, recurrent     Past Surgical History  Procedure Laterality Date  . Abdominal hysterectomy  1990  . Breast lumpectomy Bilateral   . Foot fracture surgery Right     x 3  . Toe surgery Left   . Hiatal hernia repair        Social History:  reports that she has never smoked. She has never used smokeless tobacco. She reports that she uses illicit drugs (Marijuana). She reports that she does not drink alcohol.    No Known Allergies  Family History  Problem Relation Age of Onset  . Brain cancer Mother   . Breast cancer Sister   . Breast cancer Paternal Grandmother   . Diabetes Mother   . Diabetes Sister     x 2        Prior to Admission medications   Medication Sig Start Date End Date Taking? Authorizing Provider  aspirin EC 325 MG tablet Take 325 mg by mouth daily.   Yes Historical Provider, MD  cyclobenzaprine (FLEXERIL) 10 MG tablet Take 10 mg by mouth 3 (three) times daily as  needed for muscle spasms. For muscle spasms in back   Yes Historical Provider, MD  diclofenac sodium (VOLTAREN) 1 % GEL Apply 1 g topically 3 (three) times daily as needed (back and elbow pain .). To back and elbow   Yes Historical Provider, MD  dicyclomine (BENTYL) 20 MG tablet Take  20 mg by mouth 3 (three) times daily as needed. For intestinal spasms   Yes Historical Provider, MD  fluticasone (FLONASE) 50 MCG/ACT nasal spray Place 2 sprays into the nose daily as needed for allergies.    Yes Historical Provider, MD  gabapentin (NEURONTIN) 300 MG capsule Take 900 capsules by mouth 3 (three) times daily as needed (nerve pain).  04/23/15  Yes Historical Provider, MD  lamoTRIgine (LAMICTAL) 150 MG tablet Take 150 mg by mouth daily.    Yes Historical Provider, MD  loratadine (CLARITIN) 10 MG tablet Take 1 tablet by mouth daily. 03/25/15  Yes Historical Provider, MD  pantoprazole (PROTONIX) 40 MG tablet TAKE 1 TABLET BY MOUTH EVERY DAY Patient taking differently: TAKE 40 MG BY MOUTH DAILY 01/12/15  Yes Irene Shipper, MD  perphenazine (TRILAFON) 4 MG tablet Take 4 mg by mouth 3 (three) times daily.    Yes Historical Provider, MD  pramipexole (MIRAPEX) 0.125 MG tablet Take 0.125 mg by mouth at bedtime.   Yes Historical Provider, MD  promethazine (PHENERGAN) 25 MG suppository Place 25 mg rectally every 6 (six) hours as needed for nausea or vomiting.   Yes Historical Provider, MD  promethazine (PHENERGAN) 25 MG tablet Take 1 tablet (25 mg total) by mouth every 6 (six) hours as needed for nausea or vomiting. 04/03/14  Yes Kristen N Ward, DO  venlafaxine XR (EFFEXOR-XR) 75 MG 24 hr capsule Take 75 mg by mouth daily.   Yes Historical Provider, MD     Physical Exam: Filed Vitals:   07/17/15 1150 07/17/15 1404 07/17/15 1508  BP: 185/102 179/98   Pulse: 75 76 79  Temp: 97.7 F (36.5 C) 98.4 F (36.9 C)   TempSrc: Oral Oral   Resp: 18 23 25   SpO2: 96% 97% 98%     Constitutional: Vital signs reviewed. Patient Chronically ill-appearing currently in no acute distress and cooperative with exam. Alert and oriented x3.  Head: Normocephalic and atraumatic  Ear: TM normal bilaterally  Mouth: no erythema or exudates, MMM  Eyes: PERRL, EOMI, conjunctivae normal, No scleral icterus.  Neck:  Supple, Trachea midline normal ROM, No JVD, mass, thyromegaly, or carotid bruit present.  Cardiovascular: RRR, S1 normal, S2 normal, no MRG, pulses symmetric and intact bilaterally  Pulmonary/Chest: CTAB, no wheezes, rales, or rhonchi  Abdominal: Soft.  no abdominal tenderness, non-distended, bowel sounds are normal, no masses, organomegaly, or guarding present.  GU: no CVA tenderness Musculoskeletal: No joint deformities, erythema, or stiffness, ROM full and no nontender Ext: no edema and no cyanosis, pulses palpable bilaterally (DP and PT)  Hematology: no cervical, inginal, or axillary adenopathy.  Neurological: A&O x3, Strenght is normal and symmetric bilaterally, cranial nerve II-XII are grossly intact, no focal motor deficit, sensory intact to light touch bilaterally.  Skin: Warm, dry and intact. No rash, cyanosis, or clubbing.  Psychiatric: Normal mood and affect. speech and behavior is normal. Judgment and thought content normal. Cognition and memory are normal.      Data Review   Micro Results No results found for this or any previous visit (from the past 240 hour(s)).  Radiology Reports Dg Chest 2 View  07/17/2015   CLINICAL  DATA:  Nausea vomiting abdominal pain since 05/30 this morning  EXAM: CHEST  2 VIEW  COMPARISON:  09/14/2014  FINDINGS: Heart size and vascular pattern are normal. Lungs are clear. No effusions.  IMPRESSION: No acute findings.   Electronically Signed   By: Skipper Cliche M.D.   On: 07/17/2015 13:14   Ct Abdomen Pelvis W Contrast  07/17/2015   CLINICAL DATA:  Acute abdominal pain with nausea and vomiting. Prior hiatal hernia repair and hysterectomy.  EXAM: CT ABDOMEN AND PELVIS WITH CONTRAST  TECHNIQUE: Multidetector CT imaging of the abdomen and pelvis was performed using the standard protocol following bolus administration of intravenous contrast.  CONTRAST:  163mL OMNIPAQUE IOHEXOL 300 MG/ML SOLN, 41mL OMNIPAQUE IOHEXOL 300 MG/ML SOLN  COMPARISON:  02/25/2015   FINDINGS: Lower chest: Stable small 4 mm inferior right middle lobe nodule, image 2. Minor right base atelectasis versus scarring. Lung bases otherwise clear. No lower lobe pneumonia. Normal heart size. No pericardial or pleural effusion. Postop changes at the GE junction from hiatal hernia repair. No recurrent hernia evident.  Stable 9 mm hypodense cyst in the left hepatic lobe lateral segment, image 20. No other hepatic abnormality or biliary dilatation. Patent portal vein. Gallbladder, biliary system, pancreas, spleen, adrenal glands, and kidneys are within normal limits for age and demonstrate no acute process. Incidental stable punctate tiny nonobstructing calculi in the left kidney lower pole collecting system.  Negative for bowel obstruction, dilatation, ileus, or free air.  No abdominal free fluid, fluid collection, hemorrhage, abscess, or adenopathy. Aortoiliac atherosclerosis noted without aneurysm or occlusive process. No retroperitoneal abnormality.  Scattered diverticulosis throughout the colon. Colon is decompressed accounting for the slight wall prominence. Several hyperdense appendicoliths noted. No definite surrounding inflammatory process. Terminal ileum unremarkable.  Pelvis: Prior hysterectomy noted. Urinary bladder unremarkable. Diverticulosis of the distal colon. No pelvic free fluid, fluid collection, hemorrhage, abscess, adenopathy, inguinal abnormality, or hernia.  no acute osseous finding.  IMPRESSION: Prior hiatal hernia repair and hysterectomy.  Stable 9 mm left hepatic hypodense cyst  Punctate nonobstructing left nephrolithiasis  Scattered colonic diverticulosis  Several hyperdense appendicoliths but no signs of acute appendicitis  Aortoiliac atherosclerosis without aneurysm   Electronically Signed   By: Jerilynn Mages.  Shick M.D.   On: 07/17/2015 14:16     CBC  Recent Labs Lab 07/17/15 1213 07/17/15 1521  WBC 16.3* 14.2*  HGB 15.1* 15.1*  HCT 43.2 44.8  PLT 299 296  MCV 97.1 97.8   MCH 33.9 33.0  MCHC 35.0 33.7  RDW 13.4 13.5  LYMPHSABS 1.2  --   MONOABS 0.4  --   EOSABS 0.0  --   BASOSABS 0.0  --     Chemistries   Recent Labs Lab 07/17/15 1213 07/17/15 1521  NA 143  --   K 3.5  --   CL 108  --   CO2 24  --   GLUCOSE 125*  --   BUN 16  --   CREATININE 0.62 0.66  CALCIUM 9.5  --   MG  --  1.6*  AST 22  --   ALT 13*  --   ALKPHOS 95  --   BILITOT 0.6  --    ------------------------------------------------------------------------------------------------------------------ CrCl cannot be calculated (Unknown ideal weight.). ------------------------------------------------------------------------------------------------------------------ No results for input(s): HGBA1C in the last 72 hours. ------------------------------------------------------------------------------------------------------------------ No results for input(s): CHOL, HDL, LDLCALC, TRIG, CHOLHDL, LDLDIRECT in the last 72 hours. ------------------------------------------------------------------------------------------------------------------  Recent Labs  07/17/15 1214  TSH 1.620   ------------------------------------------------------------------------------------------------------------------ No results for  input(s): VITAMINB12, FOLATE, FERRITIN, TIBC, IRON, RETICCTPCT in the last 72 hours.  Coagulation profile No results for input(s): INR, PROTIME in the last 168 hours.  No results for input(s): DDIMER in the last 72 hours.  Cardiac Enzymes  Recent Labs Lab 07/17/15 1213  TROPONINI <0.03   ------------------------------------------------------------------------------------------------------------------ Invalid input(s): POCBNP   CBG: No results for input(s): GLUCAP in the last 168 hours.     EKG: Independently reviewed.  ssessment/Plan Active Problems: Cyclical nausea vomiting-likely secondary to marijuana use in the setting of gastroesophageal reflux disease,  IBS. Admit patient for symptomatic treatment, IV hydration, will start the patient on a clear liquid diet and advance diet as tolerated. Counseled patient about marijuana use. No GI workup needed at this time.   Leukocytosis likely secondary to stress margination, CT scan negative and no underlying pathology, UA negative    Bipolar affective disorder-continue Lamictal   Irritable bowel syndrome-continue Bentyl  Her gastroesophageal reflux disease-start patient on IV Protonix and switch to by mouth Protonix tomorrow        Code Status:   full Family Communication: bedside Disposition Plan: admit   Total time spent 55 minutes.Greater than 50% of this time was spent in counseling, explanation of diagnosis, planning of further management, and coordination of care  [facility] Pager [phone]  If 7PM-7AM, please contact night-coverage [email or web address] Password [email or web address] 07/17/2015, 5:08 PM

## 2015-07-17 NOTE — ED Notes (Signed)
Provided pt with water and asked her to let me know if she was able to keep it down. Pt stated that she was still nauseous and vomiting. RN Matt aware.

## 2015-07-17 NOTE — ED Notes (Signed)
Pt is actively vomiting in room and c/o continued abdominal pain rated 7/10.

## 2015-07-17 NOTE — ED Notes (Signed)
Per EMS, pt complains of nausea/vomiting since 530AM today. Pt states her abdomen began hurting after constant vomiting. Pt states she has vomited more times than she can count. Pt took a phenergen suppository at home which did not provide relief. Pt given 4mg  zofran by EMS, 148mcg fentanyl. Pt states pain has decreased but still complains of nausea. Pt has had similar symptoms intermittently since stomach mesh surgery in November 2015.

## 2015-07-17 NOTE — ED Notes (Signed)
Bed: IQ79 Expected date:  Expected time:  Means of arrival:  Comments: Hold for rm 22

## 2015-07-17 NOTE — ED Notes (Signed)
Pt can go up at 17:38

## 2015-07-17 NOTE — ED Notes (Signed)
LAB DRAW UNSUCCESSFUL 

## 2015-07-18 DIAGNOSIS — G43A1 Cyclical vomiting, intractable: Secondary | ICD-10-CM | POA: Diagnosis not present

## 2015-07-18 DIAGNOSIS — F3170 Bipolar disorder, currently in remission, most recent episode unspecified: Secondary | ICD-10-CM | POA: Diagnosis not present

## 2015-07-18 DIAGNOSIS — G43A0 Cyclical vomiting, not intractable: Secondary | ICD-10-CM | POA: Diagnosis not present

## 2015-07-18 LAB — COMPREHENSIVE METABOLIC PANEL
ALK PHOS: 76 U/L (ref 38–126)
ALT: 12 U/L — ABNORMAL LOW (ref 14–54)
ANION GAP: 9 (ref 5–15)
AST: 31 U/L (ref 15–41)
Albumin: 3.7 g/dL (ref 3.5–5.0)
BILIRUBIN TOTAL: 0.5 mg/dL (ref 0.3–1.2)
BUN: 8 mg/dL (ref 6–20)
CALCIUM: 8.5 mg/dL — AB (ref 8.9–10.3)
CO2: 20 mmol/L — ABNORMAL LOW (ref 22–32)
Chloride: 111 mmol/L (ref 101–111)
Creatinine, Ser: 0.62 mg/dL (ref 0.44–1.00)
GFR calc non Af Amer: >60 mL/min (ref >60)
Glucose, Bld: 108 mg/dL — ABNORMAL HIGH (ref 65–99)
Potassium: 3.6 mmol/L (ref 3.5–5.1)
SODIUM: 140 mmol/L (ref 135–145)
TOTAL PROTEIN: 6.5 g/dL (ref 6.5–8.1)

## 2015-07-18 MED ORDER — BOOST / RESOURCE BREEZE PO LIQD: 1.0000 | Freq: Three times a day (TID) | ORAL | Status: DC

## 2015-07-18 MED ORDER — MAGNESIUM SULFATE 2 GM/50ML IV SOLN
2.0000 g | Freq: Once | INTRAVENOUS | Status: AC
  Administered 2015-07-18: 2 g via INTRAVENOUS
  Filled 2015-07-18: qty 50

## 2015-07-18 MED ORDER — LAMOTRIGINE 150 MG PO TABS
150.0000 mg | ORAL_TABLET | Freq: Every day | ORAL | Status: DC
  Filled 2015-07-18: qty 1

## 2015-07-18 NOTE — Progress Notes (Signed)
Reviewed discharge information with patient and caregiver. Answered all questions. Patient/caregiver able to teach back medications and reasons to contact MD/911. Patient verbalizes importance of PCP follow up appointment.  Brooke M. Shaw, RN  

## 2015-07-18 NOTE — Progress Notes (Signed)
Patient tolerated her lunch - ate 75% of cream of chicken soup, sherbert and tea. Patient reports no nausea/vomitting after eating her meal.  .[name]. Brigitte Pulse, RN

## 2015-07-18 NOTE — Progress Notes (Signed)
Attempted to make a follow up appointment per MD orders. Patient's MD's office is closed on Saturdays and writer was directed to a voicemail. Message left with office that patient needs a f/u appointment for 3-5 days post hospital appointment, and requested that they contact the patient with an appointment. Will instruct pt to follow up and make sure that she has an appointment in place once off reopens on Monday.   Othella Boyer [facility] 07/18/2015 11:50 AM

## 2015-07-18 NOTE — Discharge Summary (Signed)
Physician Discharge Summary  [name] MRN: [id number] DOB/AGE: 1955-11-18 59 y.o.  PCP: Vonna Drafts., FNP   Admit date: 07/17/2015 Discharge date: 07/18/2015  Discharge Diagnoses:   Active Problems:   COCAINE ABUSE, EPISODIC   Bipolar affective disorder   N&V (nausea and vomiting)   Cyclic vomiting syndrome   Cyclical vomiting with nausea  marijuana abuse on a daily basis   Follow-up recommendations Follow-up with PCP in 3-5 days , including all  additional recommended appointments as below Follow-up CBC, CMP in 3-5 days      Medication List    TAKE these medications        aspirin EC 325 MG tablet  Take 325 mg by mouth daily.     cyclobenzaprine 10 MG tablet  Commonly known as:  FLEXERIL  Take 10 mg by mouth 3 (three) times daily as needed for muscle spasms. For muscle spasms in back     diclofenac sodium 1 % Gel  Commonly known as:  VOLTAREN  Apply 1 g topically 3 (three) times daily as needed (back and elbow pain .). To back and elbow     dicyclomine 20 MG tablet  Commonly known as:  BENTYL  Take 20 mg by mouth 3 (three) times daily as needed. For intestinal spasms     feeding supplement Liqd  Take 1 Container by mouth 3 (three) times daily between meals.     fluticasone 50 MCG/ACT nasal spray  Commonly known as:  FLONASE  Place 2 sprays into the nose daily as needed for allergies.     gabapentin 300 MG capsule  Commonly known as:  NEURONTIN  Take 900 mg by mouth 3 (three) times daily as needed (nerve pain).     lamoTRIgine 150 MG tablet  Commonly known as:  LAMICTAL  Take 150 mg by mouth daily.     loratadine 10 MG tablet  Commonly known as:  CLARITIN  Take 1 tablet by mouth daily.     pantoprazole 40 MG tablet  Commonly known as:  PROTONIX  TAKE 1 TABLET BY MOUTH EVERY DAY     perphenazine 4 MG tablet  Commonly known as:  TRILAFON  Take 4 mg by mouth 3 (three) times daily.     pramipexole 0.125 MG tablet  Commonly known as:   MIRAPEX  Take 0.125 mg by mouth at bedtime.     promethazine 25 MG suppository  Commonly known as:  PHENERGAN  Place 25 mg rectally every 6 (six) hours as needed for nausea or vomiting.     promethazine 25 MG tablet  Commonly known as:  PHENERGAN  Take 1 tablet (25 mg total) by mouth every 6 (six) hours as needed for nausea or vomiting.     venlafaxine XR 75 MG 24 hr capsule  Commonly known as:  EFFEXOR-XR  Take 75 mg by mouth daily.         Discharge Condition: Stable  Disposition: 01-Home or Self Care   Consults: None    Significant Diagnostic Studies:  Dg Chest 2 View  07/17/2015   CLINICAL DATA:  Nausea vomiting abdominal pain since 05/30 this morning  EXAM: CHEST  2 VIEW  COMPARISON:  09/14/2014  FINDINGS: Heart size and vascular pattern are normal. Lungs are clear. No effusions.  IMPRESSION: No acute findings.   Electronically Signed   By: Skipper Cliche M.D.   On: 07/17/2015 13:14   Ct Abdomen Pelvis W Contrast  07/17/2015   CLINICAL DATA:  Acute  abdominal pain with nausea and vomiting. Prior hiatal hernia repair and hysterectomy.  EXAM: CT ABDOMEN AND PELVIS WITH CONTRAST  TECHNIQUE: Multidetector CT imaging of the abdomen and pelvis was performed using the standard protocol following bolus administration of intravenous contrast.  CONTRAST:  127m OMNIPAQUE IOHEXOL 300 MG/ML SOLN, 251mOMNIPAQUE IOHEXOL 300 MG/ML SOLN  COMPARISON:  02/25/2015  FINDINGS: Lower chest: Stable small 4 mm inferior right middle lobe nodule, image 2. Minor right base atelectasis versus scarring. Lung bases otherwise clear. No lower lobe pneumonia. Normal heart size. No pericardial or pleural effusion. Postop changes at the GE junction from hiatal hernia repair. No recurrent hernia evident.  Stable 9 mm hypodense cyst in the left hepatic lobe lateral segment, image 20. No other hepatic abnormality or biliary dilatation. Patent portal vein. Gallbladder, biliary system, pancreas, spleen, adrenal  glands, and kidneys are within normal limits for age and demonstrate no acute process. Incidental stable punctate tiny nonobstructing calculi in the left kidney lower pole collecting system.  Negative for bowel obstruction, dilatation, ileus, or free air.  No abdominal free fluid, fluid collection, hemorrhage, abscess, or adenopathy. Aortoiliac atherosclerosis noted without aneurysm or occlusive process. No retroperitoneal abnormality.  Scattered diverticulosis throughout the colon. Colon is decompressed accounting for the slight wall prominence. Several hyperdense appendicoliths noted. No definite surrounding inflammatory process. Terminal ileum unremarkable.  Pelvis: Prior hysterectomy noted. Urinary bladder unremarkable. Diverticulosis of the distal colon. No pelvic free fluid, fluid collection, hemorrhage, abscess, adenopathy, inguinal abnormality, or hernia.  no acute osseous finding.  IMPRESSION: Prior hiatal hernia repair and hysterectomy.  Stable 9 mm left hepatic hypodense cyst  Punctate nonobstructing left nephrolithiasis  Scattered colonic diverticulosis  Several hyperdense appendicoliths but no signs of acute appendicitis  Aortoiliac atherosclerosis without aneurysm   Electronically Signed   By: M.Jerilynn Mages Shick M.D.   On: 07/17/2015 14:16      Filed Weights   07/17/15 1814  Weight: 70.308 kg (155 lb)     Microbiology: No results found for this or any previous visit (from the past 240 hour(s)).     Blood Culture    Component Value Date/Time   SDES URINE, RANDOM 08/13/2014 1504   SPECREQUEST NONE 08/13/2014 1504   CULT  08/13/2014 1504    Multiple bacterial morphotypes present, none predominant. Suggest appropriate recollection if clinically indicated. Performed at [facility]2015 FINAL 08/13/2014 1504      Labs: Results for orders placed or performed during the hospital encounter of 07/17/15 (from the past 48 hour(s))  Comprehensive metabolic panel      Status: Abnormal   Collection Time: 07/17/15 12:13 PM  Result Value Ref Range   Sodium 143 135 - 145 mmol/L   Potassium 3.5 3.5 - 5.1 mmol/L   Chloride 108 101 - 111 mmol/L   CO2 24 22 - 32 mmol/L   Glucose, Bld 125 (H) 65 - 99 mg/dL   BUN 16 6 - 20 mg/dL   Creatinine, Ser 0.62 0.44 - 1.00 mg/dL   Calcium 9.5 8.9 - 10.3 mg/dL   Total Protein 7.7 6.5 - 8.1 g/dL   Albumin 4.6 3.5 - 5.0 g/dL   AST 22 15 - 41 U/L   ALT 13 (L) 14 - 54 U/L   Alkaline Phosphatase 95 38 - 126 U/L   Total Bilirubin 0.6 0.3 - 1.2 mg/dL   GFR calc non Af Amer >60 >60 mL/min   GFR calc Af Amer >60 >60 mL/min    Comment: (  NOTE) The eGFR has been calculated using the CKD EPI equation. This calculation has not been validated in all clinical situations. eGFR's persistently <60 mL/min signify possible Chronic Kidney Disease.    Anion gap 11 5 - 15  Lipase, blood     Status: Abnormal   Collection Time: 07/17/15 12:13 PM  Result Value Ref Range   Lipase 11 (L) 22 - 51 U/L  Troponin I     Status: None   Collection Time: 07/17/15 12:13 PM  Result Value Ref Range   Troponin I <0.03 <0.031 ng/mL    Comment:        NO INDICATION OF MYOCARDIAL INJURY.   CBC with Differential     Status: Abnormal   Collection Time: 07/17/15 12:13 PM  Result Value Ref Range   WBC 16.3 (H) 4.0 - 10.5 K/uL   RBC 4.45 3.87 - 5.11 MIL/uL   Hemoglobin 15.1 (H) 12.0 - 15.0 g/dL   HCT 43.2 36.0 - 46.0 %   MCV 97.1 78.0 - 100.0 fL   MCH 33.9 26.0 - 34.0 pg   MCHC 35.0 30.0 - 36.0 g/dL   RDW 13.4 11.5 - 15.5 %   Platelets 299 150 - 400 K/uL   Neutrophils Relative % 90 %   Neutro Abs 14.7 (H) 1.7 - 7.7 K/uL   Lymphocytes Relative 8 %   Lymphs Abs 1.2 0.7 - 4.0 K/uL   Monocytes Relative 2 %   Monocytes Absolute 0.4 0.1 - 1.0 K/uL   Eosinophils Relative 0 %   Eosinophils Absolute 0.0 0.0 - 0.7 K/uL   Basophils Relative 0 %   Basophils Absolute 0.0 0.0 - 0.1 K/uL  Lactic acid, plasma     Status: None   Collection Time: 07/17/15  12:14 PM  Result Value Ref Range   Lactic Acid, Venous 1.0 0.5 - 2.0 mmol/L  TSH     Status: None   Collection Time: 07/17/15 12:14 PM  Result Value Ref Range   TSH 1.620 0.350 - 4.500 uIU/mL  Urinalysis, Routine w reflex microscopic     Status: Abnormal   Collection Time: 07/17/15  2:15 PM  Result Value Ref Range   Color, Urine YELLOW YELLOW   APPearance CLOUDY (A) CLEAR   Specific Gravity, Urine 1.038 (H) 1.005 - 1.030   pH 8.5 (H) 5.0 - 8.0   Glucose, UA NEGATIVE NEGATIVE mg/dL   Hgb urine dipstick TRACE (A) NEGATIVE   Bilirubin Urine NEGATIVE NEGATIVE   Ketones, ur NEGATIVE NEGATIVE mg/dL   Protein, ur NEGATIVE NEGATIVE mg/dL   Urobilinogen, UA 0.2 0.0 - 1.0 mg/dL   Nitrite NEGATIVE NEGATIVE   Leukocytes, UA NEGATIVE NEGATIVE  Urine microscopic-add on     Status: None   Collection Time: 07/17/15  2:15 PM  Result Value Ref Range   Squamous Epithelial / LPF RARE RARE   RBC / HPF 0-2 <3 RBC/hpf   Bacteria, UA RARE RARE   Urine-Other AMORPHOUS URATES/PHOSPHATES   Urine rapid drug screen (hosp performed)     Status: Abnormal   Collection Time: 07/17/15  2:15 PM  Result Value Ref Range   Opiates NONE DETECTED NONE DETECTED   Cocaine NONE DETECTED NONE DETECTED   Benzodiazepines NONE DETECTED NONE DETECTED   Amphetamines NONE DETECTED NONE DETECTED   Tetrahydrocannabinol POSITIVE (A) NONE DETECTED   Barbiturates NONE DETECTED NONE DETECTED    Comment:        DRUG SCREEN FOR MEDICAL PURPOSES ONLY.  IF CONFIRMATION IS NEEDED FOR  ANY PURPOSE, NOTIFY LAB WITHIN 5 DAYS.        LOWEST DETECTABLE LIMITS FOR URINE DRUG SCREEN Drug Class       Cutoff (ng/mL) Amphetamine      1000 Barbiturate      200 Benzodiazepine   794 Tricyclics       801 Opiates          300 Cocaine          300 THC              50   Lactic acid, plasma     Status: None   Collection Time: 07/17/15  3:19 PM  Result Value Ref Range   Lactic Acid, Venous 0.9 0.5 - 2.0 mmol/L  CBC     Status: Abnormal    Collection Time: 07/17/15  3:21 PM  Result Value Ref Range   WBC 14.2 (H) 4.0 - 10.5 K/uL   RBC 4.58 3.87 - 5.11 MIL/uL   Hemoglobin 15.1 (H) 12.0 - 15.0 g/dL   HCT 44.8 36.0 - 46.0 %   MCV 97.8 78.0 - 100.0 fL   MCH 33.0 26.0 - 34.0 pg   MCHC 33.7 30.0 - 36.0 g/dL   RDW 13.5 11.5 - 15.5 %   Platelets 296 150 - 400 K/uL  Creatinine, serum     Status: None   Collection Time: 07/17/15  3:21 PM  Result Value Ref Range   Creatinine, Ser 0.66 0.44 - 1.00 mg/dL   GFR calc non Af Amer >60 >60 mL/min   GFR calc Af Amer >60 >60 mL/min    Comment: (NOTE) The eGFR has been calculated using the CKD EPI equation. This calculation has not been validated in all clinical situations. eGFR's persistently <60 mL/min signify possible Chronic Kidney Disease.   Magnesium     Status: Abnormal   Collection Time: 07/17/15  3:21 PM  Result Value Ref Range   Magnesium 1.6 (L) 1.7 - 2.4 mg/dL  Comprehensive metabolic panel     Status: Abnormal   Collection Time: 07/18/15  9:16 AM  Result Value Ref Range   Sodium 140 135 - 145 mmol/L   Potassium 3.6 3.5 - 5.1 mmol/L   Chloride 111 101 - 111 mmol/L   CO2 20 (L) 22 - 32 mmol/L   Glucose, Bld 108 (H) 65 - 99 mg/dL   BUN 8 6 - 20 mg/dL   Creatinine, Ser 0.62 0.44 - 1.00 mg/dL   Calcium 8.5 (L) 8.9 - 10.3 mg/dL   Total Protein 6.5 6.5 - 8.1 g/dL   Albumin 3.7 3.5 - 5.0 g/dL   AST 31 15 - 41 U/L   ALT 12 (L) 14 - 54 U/L   Alkaline Phosphatase 76 38 - 126 U/L   Total Bilirubin 0.5 0.3 - 1.2 mg/dL   GFR calc non Af Amer >60 >60 mL/min   GFR calc Af Amer >60 >60 mL/min    Comment: (NOTE) The eGFR has been calculated using the CKD EPI equation. This calculation has not been validated in all clinical situations. eGFR's persistently <60 mL/min signify possible Chronic Kidney Disease.    Anion gap 9 5 - 15     Lipid Panel     Component Value Date/Time   CHOL 168 01/30/2014 0617   TRIG 168* 01/30/2014 0617   HDL 53 01/30/2014 0617   CHOLHDL 3.2  01/30/2014 0617   VLDL 34 01/30/2014 0617   LDLCALC 81 01/30/2014 0617     Lab  Results  Component Value Date   HGBA1C 5.3 01/30/2014   HGBA1C 5.6 11/16/2013   HGBA1C  05/25/2010    5.3 (NOTE)                                                                       According to the ADA Clinical Practice Recommendations for 2011, when HbA1c is used as a screening test:   >=6.5%   Diagnostic of Diabetes Mellitus           (if abnormal result  is confirmed)  5.7-6.4%   Increased risk of developing Diabetes Mellitus  References:Diagnosis and Classification of Diabetes Mellitus,Diabetes PIRJ,1884,16(SAYTK 1):S62-S69 and Standards of Medical Care in         Diabetes - 2011,Diabetes Care,2011,34  (Suppl 1):S11-S61.     Lab Results  Component Value Date   LDLCALC 81 01/30/2014   CREATININE 0.62 07/18/2015     HPI :59 year old female with a history of gastroesophageal reflux disease, IBS, bipolar disorder, prior CVA, status post laparoscopic paraesophageal hernia repair in November 2015, substance abuse with prior history of cocaine abuse and daily marijuana use who presents to the ER with intractable nausea vomiting that started last night. She also rated her abdominal pain as being 7 out of 10 this morning. Patient continued to have nausea vomiting in the ER despite several doses of IV Zofran, IV Phenergan. UDS positive for marijuana..  Patient has had previous extensive workup including an admission in April 2016 when she presented to the hospital with epigastric pain nausea vomiting, leukocytosis and negative CT scan. Patient has exact same presentation this time. She saw Dr. Henrene Pastor on 9/14 and was offered a colonoscopy but she declined. Previous GI evaluations included unremarkable colonoscopy in 2003 and 2004. Patient is on chronic promethazine , Bentyl, Phenergan, at home.  HOSPITAL COURSE:   Cyclical nausea vomiting-likely secondary to marijuana use in the setting of gastroesophageal reflux  disease, IBS. Patient admitted for symptomatic treatment, IV hydration, diet advanced from clear liquids to a soft diet which the patient tolerated well. Counseled patient about marijuana use. No GI workup needed at this time. Patient will follow-up with Dr. Henrene Pastor in the outpatient setting in the next 2-3 weeks  Leukocytosis likely secondary to stress margination, improved with IV fluids, CT scan negative and no underlying pathology, UA negative   Bipolar affective disorder-continue Lamictal  Irritable bowel syndrome-continue Bentyl  Her gastroesophageal reflux disease-start patient on IV Protonix and switch to by mouth Protonix tomorrow   Discharge Exam:  Blood pressure 117/80, pulse 69, temperature 99.2 F (37.3 C), temperature source Oral, resp. rate 18, height '5\' 6"'  (1.676 m), weight 70.308 kg (155 lb), SpO2 99 %.  Cardiovascular: RRR, S1 normal, S2 normal, no MRG, pulses symmetric and intact bilaterally  Pulmonary/Chest: CTAB, no wheezes, rales, or rhonchi  Abdominal: Soft. no abdominal tenderness, non-distended, bowel sounds are normal, no masses, organomegaly, or guarding present.  GU: no CVA tenderness Musculoskeletal: No joint deformities, erythema, or stiffness, ROM full and no nontender Ext: no edema and no cyanosis, pulses palpable bilaterally (DP and PT)  Hematology: no cervical, inginal, or axillary adenopathy.  Neurological: A&O x3, Strenght is normal and symmetric bilaterally, cranial nerve II-XII are grossly intact, no focal motor  deficit, sensory intact to light touch bilaterally.        Discharge Instructions    Diet - low sodium heart healthy    Complete by:  As directed      Increase activity slowly    Complete by:  As directed            Follow-up Information    Follow up with Vonna Drafts., FNP. Schedule an appointment as soon as possible for a visit in 3 days.   Specialty:  Nurse Practitioner   Contact information:   [address]  [place]  [id number] [phone]       Follow up with Scarlette Shorts, MD. Schedule an appointment as soon as possible for a visit in 2 weeks.   Specialty:  Gastroenterology   Why:  Hospital follow-up   Contact information:   [address] [address] [phone]       Signed: Reyne Dumas 07/18/2015, 11:50 AM        Time spent >45 mins

## 2015-07-22 ENCOUNTER — Encounter (HOSPITAL_COMMUNITY): Payer: Self-pay | Admitting: Family Medicine

## 2015-07-22 ENCOUNTER — Emergency Department (HOSPITAL_COMMUNITY)
Admission: EM | Admit: 2015-07-22 | Discharge: 2015-07-22 | Disposition: A | Discharge status: Home / Self Care | Payer: Medicare HMO | Attending: Emergency Medicine | Admitting: Emergency Medicine

## 2015-07-22 DIAGNOSIS — R197 Diarrhea, unspecified: Secondary | ICD-10-CM | POA: Diagnosis not present

## 2015-07-22 DIAGNOSIS — Z7982 Long term (current) use of aspirin: Secondary | ICD-10-CM | POA: Diagnosis not present

## 2015-07-22 DIAGNOSIS — Z79899 Other long term (current) drug therapy: Secondary | ICD-10-CM | POA: Insufficient documentation

## 2015-07-22 DIAGNOSIS — Z8673 Personal history of transient ischemic attack (TIA), and cerebral infarction without residual deficits: Secondary | ICD-10-CM | POA: Diagnosis not present

## 2015-07-22 DIAGNOSIS — F319 Bipolar disorder, unspecified: Secondary | ICD-10-CM | POA: Diagnosis not present

## 2015-07-22 DIAGNOSIS — G8929 Other chronic pain: Secondary | ICD-10-CM | POA: Insufficient documentation

## 2015-07-22 DIAGNOSIS — R1115 Cyclical vomiting syndrome unrelated to migraine: Secondary | ICD-10-CM

## 2015-07-22 DIAGNOSIS — R112 Nausea with vomiting, unspecified: Secondary | ICD-10-CM | POA: Insufficient documentation

## 2015-07-22 LAB — CBC WITH DIFFERENTIAL/PLATELET
BASOS PCT: 0 %
Basophils Absolute: 0 10*3/uL (ref 0.0–0.1)
EOS PCT: 1 %
Eosinophils Absolute: 0.2 10*3/uL (ref 0.0–0.7)
HCT: 42.1 % (ref 36.0–46.0)
Hemoglobin: 14.9 g/dL (ref 12.0–15.0)
LYMPHS ABS: 2.3 10*3/uL (ref 0.7–4.0)
Lymphocytes Relative: 12 %
MCH: 33.9 pg (ref 26.0–34.0)
MCHC: 35.4 g/dL (ref 30.0–36.0)
MCV: 95.7 fL (ref 78.0–100.0)
MONO ABS: 0.8 10*3/uL (ref 0.1–1.0)
Monocytes Relative: 4 %
NEUTROS ABS: 16 10*3/uL — AB (ref 1.7–7.7)
Neutrophils Relative %: 83 %
PLATELETS: ADEQUATE 10*3/uL (ref 150–400)
RBC: 4.4 MIL/uL (ref 3.87–5.11)
RDW: 13.3 % (ref 11.5–15.5)
WBC: 19.3 10*3/uL — ABNORMAL HIGH (ref 4.0–10.5)

## 2015-07-22 LAB — COMPREHENSIVE METABOLIC PANEL
ALT: 13 U/L — ABNORMAL LOW (ref 14–54)
AST: 47 U/L — ABNORMAL HIGH (ref 15–41)
Albumin: 4 g/dL (ref 3.5–5.0)
Alkaline Phosphatase: 75 U/L (ref 38–126)
Anion gap: 10 (ref 5–15)
BILIRUBIN TOTAL: 1 mg/dL (ref 0.3–1.2)
BUN: 13 mg/dL (ref 6–20)
CHLORIDE: 109 mmol/L (ref 101–111)
CO2: 23 mmol/L (ref 22–32)
CREATININE: 0.63 mg/dL (ref 0.44–1.00)
Calcium: 9.2 mg/dL (ref 8.9–10.3)
Glucose, Bld: 131 mg/dL — ABNORMAL HIGH (ref 65–99)
POTASSIUM: 4.1 mmol/L (ref 3.5–5.1)
Sodium: 142 mmol/L (ref 135–145)
TOTAL PROTEIN: 6.9 g/dL (ref 6.5–8.1)

## 2015-07-22 LAB — LIPASE, BLOOD: LIPASE: 17 U/L — AB (ref 22–51)

## 2015-07-22 MED ORDER — ALUM & MAG HYDROXIDE-SIMETH 200-200-20 MG/5ML PO SUSP
15.0000 mL | Freq: Once | ORAL | Status: AC
  Administered 2015-07-22: 15 mL via ORAL
  Filled 2015-07-22: qty 30

## 2015-07-22 MED ORDER — ONDANSETRON HCL 4 MG/2ML IJ SOLN
4.0000 mg | Freq: Once | INTRAMUSCULAR | Status: AC
  Administered 2015-07-22: 4 mg via INTRAVENOUS
  Filled 2015-07-22: qty 2

## 2015-07-22 MED ORDER — PROCHLORPERAZINE EDISYLATE 5 MG/ML IJ SOLN
10.0000 mg | Freq: Once | INTRAMUSCULAR | Status: AC
  Administered 2015-07-22: 10 mg via INTRAVENOUS
  Filled 2015-07-22: qty 2

## 2015-07-22 MED ORDER — GI COCKTAIL ~~LOC~~
30.0000 mL | Freq: Once | ORAL | Status: AC
  Administered 2015-07-22: 30 mL via ORAL
  Filled 2015-07-22: qty 30

## 2015-07-22 MED ORDER — HALOPERIDOL LACTATE 5 MG/ML IJ SOLN
5.0000 mg | Freq: Once | INTRAMUSCULAR | Status: AC
  Administered 2015-07-22: 5 mg via INTRAVENOUS
  Filled 2015-07-22: qty 1

## 2015-07-22 MED ORDER — PROMETHAZINE HCL 25 MG PO TABS: 25.0000 mg | ORAL_TABLET | Freq: Four times a day (QID) | ORAL | Status: DC | PRN

## 2015-07-22 MED ORDER — FAMOTIDINE IN NACL 20-0.9 MG/50ML-% IV SOLN
20.0000 mg | Freq: Once | INTRAVENOUS | Status: AC
  Administered 2015-07-22: 20 mg via INTRAVENOUS
  Filled 2015-07-22: qty 50

## 2015-07-22 MED ORDER — DIPHENHYDRAMINE HCL 50 MG/ML IJ SOLN
25.0000 mg | Freq: Once | INTRAMUSCULAR | Status: AC
  Administered 2015-07-22: 25 mg via INTRAVENOUS
  Filled 2015-07-22: qty 1

## 2015-07-22 MED ORDER — PROMETHAZINE HCL 25 MG RE SUPP: 25.0000 mg | Freq: Four times a day (QID) | RECTAL | Status: DC | PRN

## 2015-07-22 MED ORDER — KETOROLAC TROMETHAMINE 30 MG/ML IJ SOLN
15.0000 mg | Freq: Once | INTRAMUSCULAR | Status: AC
  Administered 2015-07-22: 15 mg via INTRAVENOUS
  Filled 2015-07-22: qty 1

## 2015-07-22 MED ORDER — SODIUM CHLORIDE 0.9 % IV BOLUS (SEPSIS)
1000.0000 mL | Freq: Once | INTRAVENOUS | Status: AC
  Administered 2015-07-22: 1000 mL via INTRAVENOUS

## 2015-07-22 NOTE — ED Provider Notes (Signed)
CSN: [id number]     Arrival date & time 07/22/15  0703 History   First MD Initiated Contact with Patient 07/22/15 [id number]     Chief Complaint  Patient presents with  . Emesis  . Nausea  . Diarrhea     (Consider location/radiation/quality/duration/timing/severity/associated sxs/prior Treatment) Patient is a 59 y.o. female presenting with vomiting and diarrhea. The history is provided by the patient.  Emesis Severity:  Severe Duration:  3 hours Timing:  Constant Progression:  Worsening Chronicity:  New Relieved by:  Nothing Worsened by:  Nothing tried Ineffective treatments:  None tried Associated symptoms: abdominal pain (epigastric) and diarrhea   Associated symptoms: no arthralgias, no chills, no headaches and no myalgias   Diarrhea Associated symptoms: abdominal pain (epigastric) and vomiting   Associated symptoms: no arthralgias, no chills, no fever, no headaches and no myalgias    59 yo F with a chief complaint of vomiting and diarrhea. Patient states that this is her recurrent IBS as well as recurrent vomiting syndrome. Patient states that she had pain after the onset of her vomiting. Subjective fevers and chills. Subjective paresthesias to the right arm. Denies any chest pain shortness breath.   Past Medical History  Diagnosis Date  . Hiatal hernia   . IBS (irritable bowel syndrome)   . Diverticulosis   . Bipolar affective disorder   . GERD (gastroesophageal reflux disease)   . Chronic headaches   . Depression   . Stroke   . Endometriosis   . Cyclical vomiting syndrome   . Abdominal pain     chronic, recurrent   Past Surgical History  Procedure Laterality Date  . Abdominal hysterectomy  1990  . Breast lumpectomy Bilateral   . Foot fracture surgery Right     x 3  . Toe surgery Left   . Hiatal hernia repair     Family History  Problem Relation Age of Onset  . Brain cancer Mother   . Breast cancer Sister   . Breast cancer Paternal Grandmother   . Diabetes  Mother   . Diabetes Sister     x 2   Social History  Substance Use Topics  . Smoking status: Never Smoker   . Smokeless tobacco: Never Used  . Alcohol Use: No   OB History    No data available     Review of Systems  Constitutional: Negative for fever and chills.  HENT: Negative for congestion and rhinorrhea.   Eyes: Negative for redness and visual disturbance.  Respiratory: Negative for shortness of breath and wheezing.   Cardiovascular: Negative for chest pain and palpitations.  Gastrointestinal: Positive for vomiting, abdominal pain (epigastric) and diarrhea. Negative for nausea.  Genitourinary: Negative for dysuria and urgency.  Musculoskeletal: Negative for myalgias and arthralgias.  Skin: Negative for pallor and wound.  Neurological: Negative for dizziness and headaches.      Allergies  Review of patient's allergies indicates no known allergies.  Home Medications   Prior to Admission medications   Medication Sig Start Date End Date Taking? Authorizing Jenniffer Vessels  aspirin EC 325 MG tablet Take 325 mg by mouth daily.   Yes Historical Saladin Petrelli, MD  cyclobenzaprine (FLEXERIL) 10 MG tablet Take 10 mg by mouth 3 (three) times daily as needed for muscle spasms. For muscle spasms in back   Yes Historical Tomorrow Dehaas, MD  diclofenac sodium (VOLTAREN) 1 % GEL Apply 1 g topically 3 (three) times daily as needed (back and elbow pain .). To back and elbow  Yes Historical Gustabo Gordillo, MD  dicyclomine (BENTYL) 20 MG tablet Take 20 mg by mouth 3 (three) times daily as needed. For intestinal spasms   Yes Historical Christiano Blandon, MD  feeding supplement (BOOST / RESOURCE BREEZE) LIQD Take 1 Container by mouth 3 (three) times daily between meals. 07/18/15  Yes Reyne Dumas, MD  fluticasone (FLONASE) 50 MCG/ACT nasal spray Place 2 sprays into the nose daily as needed for allergies.    Yes Historical Sandra Tellefsen, MD  gabapentin (NEURONTIN) 300 MG capsule Take 900 mg by mouth 3 (three) times daily as needed  (nerve pain).  04/23/15  Yes Historical Eduard Penkala, MD  lamoTRIgine (LAMICTAL) 150 MG tablet Take 150 mg by mouth daily.    Yes Historical Hillari Zumwalt, MD  loratadine (CLARITIN) 10 MG tablet Take 1 tablet by mouth daily. 03/25/15  Yes Historical Raydin Bielinski, MD  pantoprazole (PROTONIX) 40 MG tablet TAKE 1 TABLET BY MOUTH EVERY DAY Patient taking differently: TAKE 40 MG BY MOUTH DAILY 01/12/15  Yes Irene Shipper, MD  perphenazine (TRILAFON) 4 MG tablet Take 4 mg by mouth 3 (three) times daily.    Yes Historical Belicia Difatta, MD  pramipexole (MIRAPEX) 0.125 MG tablet Take 0.125 mg by mouth at bedtime.   Yes Historical Pershing Skidmore, MD  venlafaxine XR (EFFEXOR-XR) 75 MG 24 hr capsule Take 75 mg by mouth daily.   Yes Historical Idaly Verret, MD  promethazine (PHENERGAN) 25 MG suppository Place 1 suppository (25 mg total) rectally every 6 (six) hours as needed for nausea or vomiting. 07/22/15   Deno Etienne, DO  promethazine (PHENERGAN) 25 MG tablet Take 1 tablet (25 mg total) by mouth every 6 (six) hours as needed for nausea or vomiting. 07/22/15   Deno Etienne, DO   BP 155/70 mmHg  Pulse 81  Temp(Src) 99.2 F (37.3 C)  Resp 18  SpO2 97% Physical Exam  Constitutional: She is oriented to person, place, and time. She appears well-developed and well-nourished. No distress.  HENT:  Head: Normocephalic and atraumatic.  Eyes: EOM are normal. Pupils are equal, round, and reactive to light.  Neck: Normal range of motion. Neck supple.  Cardiovascular: Normal rate and regular rhythm.  Exam reveals no gallop and no friction rub.   No murmur heard. Pulmonary/Chest: Effort normal. She has no wheezes. She has no rales.  Abdominal: Soft. She exhibits no distension. There is no tenderness. There is no rebound and no guarding.  Epigastric pain on abdominal exam, though distractible, benign  Musculoskeletal: She exhibits no edema or tenderness.  Neurological: She is alert and oriented to person, place, and time.  Skin: Skin is warm and  dry. She is not diaphoretic.  Psychiatric: She has a normal mood and affect. Her behavior is normal.    ED Course  Procedures (including critical care time) Labs Review Labs Reviewed  CBC WITH DIFFERENTIAL/PLATELET - Abnormal; Notable for the following:    WBC 19.3 (*)    Neutro Abs 16.0 (*)    All other components within normal limits  COMPREHENSIVE METABOLIC PANEL - Abnormal; Notable for the following:    Glucose, Bld 131 (*)    AST 47 (*)    ALT 13 (*)    All other components within normal limits  LIPASE, BLOOD - Abnormal; Notable for the following:    Lipase 17 (*)    All other components within normal limits    Imaging Review No results found. I have personally reviewed and evaluated these images and lab results as part of my medical decision-making.  EKG Interpretation   Date/Time:  Wednesday July 22 2015 08:54:42 EDT Ventricular Rate:  86 PR Interval:  188 QRS Duration: 87 QT Interval:  387 QTC Calculation: 463 R Axis:   63 Text Interpretation:  Sinus rhythm Consider right atrial enlargement  Probable LVH with secondary repol abnrm Anterior Q waves, possibly due to  LVH No significant change since last tracing Confirmed by FLOYD MD, Quillian Quince  ([id number]) on 07/22/2015 9:31:55 AM         MDM   Final diagnoses:  Non-intractable cyclical vomiting with nausea    59 yo F with a chief complaint of cyclical vomiting syndrome. Similar to prior episodes of her patient complaining of some subjective paresthesias to the right arm. No noted neuro deficits that side. Given Compazine and Benadryl. Given GI cocktail.  Laboratory evaluation with continued elevated leukocytosis is a chronic problem for this patient. Plts clumps unable to get a accurate count. Continuing to vomit while in the ED. We'll give Haldol. EKG for QTC check.  QTC 460. Given Zofran as well. Patient's pain and symptoms improved with Maalox. Patient feeling much better has not vomited available to  take by mouth in the ED. Patient had run out of her home nausea medicine or write her prescription for both. Patient continues to have sensation in her right arm. Pulse motor and sensation intact distally. Patient states this is been off and on issue for the past couple years since she had a stroke. We'll have her follow with her PCP.   I have discussed the diagnosis/risks/treatment options with the patient and believe the pt to be eligible for discharge home to follow-up with PCP. We also discussed returning to the ED immediately if new or worsening sx occur. We discussed the sx which are most concerning (e.g., sudden worsening symptoms) that necessitate immediate return. Medications administered to the patient during their visit and any new prescriptions provided to the patient are listed below.  Medications given during this visit Medications  prochlorperazine (COMPAZINE) injection 10 mg (10 mg Intravenous Given 07/22/15 0738)  diphenhydrAMINE (BENADRYL) injection 25 mg (25 mg Intravenous Given 07/22/15 0738)  sodium chloride 0.9 % bolus 1,000 mL (0 mLs Intravenous Stopped 07/22/15 0945)  gi cocktail (Maalox,Lidocaine,Donnatal) (30 mLs Oral Given 07/22/15 0741)  haloperidol lactate (HALDOL) injection 5 mg (5 mg Intravenous Given 07/22/15 0901)  ketorolac (TORADOL) 30 MG/ML injection 15 mg (15 mg Intravenous Given 07/22/15 0901)  famotidine (PEPCID) IVPB 20 mg premix (0 mg Intravenous Stopped 07/22/15 0945)  ondansetron (ZOFRAN) injection 4 mg (4 mg Intravenous Given 07/22/15 0945)  alum & mag hydroxide-simeth (MAALOX/MYLANTA) 200-200-20 MG/5ML suspension 15 mL (15 mLs Oral Given 07/22/15 1020)    New Prescriptions   No medications on file     The patient appears reasonably screen and/or stabilized for discharge and I doubt any other medical condition or other [facility] requiring further screening, evaluation, or treatment in the ED at this time prior to discharge.    Deno Etienne, DO 07/22/15 1046

## 2015-07-22 NOTE — ED Notes (Signed)
Pt wheeled to waiting room to wait for husband to transport home. A/O x4. Able to keep fluids down. Verbalizes understanding of discharge instructions.

## 2015-07-22 NOTE — ED Notes (Signed)
Pt here for vomiting that started at 4 am. sts recently sick. sts abdominal pain

## 2015-07-22 NOTE — Discharge Instructions (Signed)
Cyclic Vomiting Syndrome °Cyclic vomiting syndrome is a benign condition in which patients experience bouts or cycles of severe nausea and vomiting that last for hours or even days. The bouts of nausea and vomiting alternate with longer periods of no symptoms and generally good health. Cyclic vomiting syndrome occurs mostly in children, but can affect adults. °CAUSES  °[facility] has no known cause. Each episode is typically similar to the previous ones. The episodes tend to:  °· Start at about the same time of day. °· Last the same length of time. °· Present the same symptoms at the same level of intensity. °Cyclic vomiting syndrome can begin at any age in children and adults. Cyclic vomiting syndrome usually starts between the ages of 3 and 7 years. In adults, episodes tend to occur less often than they do in children, but they last longer. Furthermore, the events or situations that trigger episodes in adults cannot always be pinpointed as easily as they can in children. °There are 4 phases of cyclic vomiting syndrome: °1. Prodrome. The prodrome phase signals that an episode of nausea and vomiting is about to begin. This phase can last from just a few minutes to several hours. This phase is often marked by belly (abdominal) pain. Sometimes taking medicine early in the prodrome phase can stop an episode in progress. However, sometimes there is no warning. A person may simply wake up in the middle of the night or early morning and begin vomiting. °2. Episode. The episode phase consists of: °· Severe vomiting. °· Nausea. °· Gagging (retching). °3. Recovery. The recovery phase begins when the nausea and vomiting stop. Healthy color, appetite, and energy return. °4. Symptom-free interval. The symptom-free interval phase is the period between episodes when no symptoms are present. °TRIGGERS °Episodes can be triggered by an infection or event. Examples of triggers include: °· Infections. °· Colds, allergies, sinus problems, and  the flu. °· Eating certain foods such as chocolate or cheese. °· Foods with monosodium glutamate (MSG) or preservatives. °· Fast foods. °· Pre-packaged foods. °· Foods with low nutritional value (junk foods). °· Overeating. °· Eating just before going to bed. °· Hot weather. °· Dehydration. °· Not enough sleep or poor sleep quality. °· Physical exhaustion. °· Menstruation. °· Motion sickness. °· Emotional stress (school or home difficulties). °· Excitement or stress. °SYMPTOMS  °The main symptoms of cyclic vomiting syndrome are: °· Severe vomiting. °· Nausea. °· Gagging (retching). °Episodes usually begin at night or the first thing in the morning. Episodes may include vomiting or retching up to 5 or 6 times an hour during the worst of the episode. Episodes usually last anywhere from 1 to 4 days. Episodes can last for up to 10 days. Other symptoms include: °· Paleness. °· Exhaustion. °· Listlessness. °· Abdominal pain. °· Loose stools or diarrhea. °Sometimes the nausea and vomiting are so severe that a person appears to be almost unconscious. Sensitivity to light, headache, fever, dizziness, may also accompany an episode. In addition, the vomiting may cause drooling and excessive thirst. Drinking water usually leads to more vomiting, though the water can dilute the acid in the vomit, making the episode a little less painful. Continuous vomiting can lead to dehydration, which means that the body has lost excessive water and salts. °DIAGNOSIS  °Cyclic vomiting syndrome is hard to diagnose because there are no clear tests to identify it. A caregiver must diagnose cyclic vomiting syndrome by looking at symptoms and medical history. A caregiver must exclude more common diseases   or disorders that can also cause nausea and vomiting. Also, diagnosis takes time because caregivers need to identify a pattern or cycle to the vomiting. °TREATMENT  °Cyclic vomiting syndrome cannot be cured. Treatment varies, but people with  cyclic vomiting syndrome should get plenty of rest and sleep and take medications that prevent, stop, or lessen the vomiting episodes and other symptoms. °People whose episodes are frequent and long-lasting may be treated during the symptom-free intervals in an effort to prevent or ease future episodes. The symptom-free phase is a good time to eliminate anything known to trigger an episode. For example, if episodes are brought on by stress or excitement, this period is the time to find ways to reduce stress and stay calm. If sinus problems or allergies cause episodes, those conditions should be treated. The triggers listed above should be avoided or prevented. °Because of the similarities between migraine and cyclic vomiting syndrome, caregivers treat some people with severe cyclic vomiting syndrome with drugs that are also used for migraine headaches. The drugs are designed to: °· Prevent episodes. °· Reduce their frequency. °· Lessen their severity. °HOME CARE INSTRUCTIONS °Once a vomiting episode begins, treatment is supportive. It helps to stay in bed and sleep in a dark, quiet room. Severe nausea and vomiting may require hospitalization and intravenous (IV) fluids to prevent dehydration. Relaxing medications (sedatives) may help if the nausea continues. Sometimes, during the prodrome phase, it is possible to stop an episode from happening altogether. Only take over-the-counter or prescription medicines for pain, discomfort or fever as directed by your caregiver. Do not give aspirin to children. °During the recovery phase, drinking water and replacing lost electrolytes (salts in the blood) are very important. Electrolytes are salts that the body needs to function well and stay healthy. Symptoms during the recovery phase can vary. Some people find that their appetites return to normal immediately, while others need to begin by drinking clear liquids and then move slowly to solid food. °RELATED COMPLICATIONS °The  severe vomiting that defines cyclic vomiting syndrome is a risk factor for several complications: °· Dehydration--Vomiting causes the body to lose water quickly. °· Electrolyte imbalance--Vomiting also causes the body to lose the important salts it needs to keep working properly. °· Peptic esophagitis--The tube that connects the mouth to the stomach (esophagus) becomes injured from the stomach acid that comes up with the vomit. °· Hematemesis--The esophagus becomes irritated and bleeds, so blood mixes with the vomit. °· Mallory-Weiss tear--The lower end of the esophagus may tear open or the stomach may bruise from vomiting or retching. °· Tooth decay--The acid in the vomit can hurt the teeth by corroding the tooth enamel. °SEEK MEDICAL CARE IF: °You have questions or problems. °Document Released: 12/26/2001 Document Revised: 01/09/2012 Document Reviewed: 01/24/2011 °[employer]® Patient Information ©2015 [employer]. This information is not intended to replace advice given to you by your health care provider. Make sure you discuss any questions you have with your health care provider. ° °

## 2015-08-05 ENCOUNTER — Telehealth: Payer: Self-pay | Admitting: *Deleted

## 2015-08-05 ENCOUNTER — Ambulatory Visit (INDEPENDENT_AMBULATORY_CARE_PROVIDER_SITE_OTHER): Payer: Medicare HMO | Admitting: Physician Assistant

## 2015-08-05 ENCOUNTER — Encounter: Payer: Self-pay | Admitting: Physician Assistant

## 2015-08-05 ENCOUNTER — Telehealth: Payer: Self-pay | Admitting: Physician Assistant

## 2015-08-05 VITALS — BP 110/62 | HR 72 | Ht 66.0 in | Wt 151.6 lb

## 2015-08-05 DIAGNOSIS — G43A1 Cyclical vomiting, intractable: Secondary | ICD-10-CM

## 2015-08-05 DIAGNOSIS — Z1211 Encounter for screening for malignant neoplasm of colon: Secondary | ICD-10-CM

## 2015-08-05 DIAGNOSIS — K219 Gastro-esophageal reflux disease without esophagitis: Secondary | ICD-10-CM

## 2015-08-05 DIAGNOSIS — R1115 Cyclical vomiting syndrome unrelated to migraine: Secondary | ICD-10-CM

## 2015-08-05 DIAGNOSIS — R110 Nausea: Secondary | ICD-10-CM

## 2015-08-05 DIAGNOSIS — K589 Irritable bowel syndrome without diarrhea: Secondary | ICD-10-CM | POA: Diagnosis not present

## 2015-08-05 MED ORDER — PANTOPRAZOLE SODIUM 40 MG PO TBEC: 40.0000 mg | DELAYED_RELEASE_TABLET | Freq: Every day | ORAL | Status: DC

## 2015-08-05 MED ORDER — NA SULFATE-K SULFATE-MG SULF 17.5-3.13-1.6 GM/177ML PO SOLN: 1.0000 | Freq: Once | ORAL | Status: AC

## 2015-08-05 MED ORDER — SUCRALFATE 1 GM/10ML PO SUSP: ORAL | Status: DC

## 2015-08-05 MED ORDER — PROMETHAZINE HCL 25 MG PO TABS: 25.0000 mg | ORAL_TABLET | Freq: Four times a day (QID) | ORAL | Status: DC | PRN

## 2015-08-05 NOTE — Progress Notes (Signed)
Patient ID: [name], female   DOB: April 18, 1956, 59 y.o.   MRN: [id number]     History of Present Illness: [name] is a 59 year old female who is known to Dr. Henrene Pastor with a history of GERD, IBS, bipolar disorder, chronic headaches, and prior stroke. She was initially evaluated in November 2015 for problems with nausea, vomiting and epigastric pain. Upper GI series revealed a large paraesophageal hernia. She was referred to general surgery for repair however while visiting her family in [place], she developed acute symptoms and in late November 2015 underwent laparoscopic paraesophageal hernia repair with biome mesh and gastropexy at [facility]. She recovered uneventfully. She does note that in April 2016 she presented to the hospital with epigastric pain nausea and vomiting. She was found to have a leukocytosis. CT scan was negative. She was last evaluated by Dr. Henrene Pastor in June 2016 and advised to continue her PPI and adhere to an antireflux regimen. At that visit she was also recommended to have a screening colonoscopy but preferred to think about it some more. She returns today stating that she has been feeling fairly well but notes whenever she has a period of stress she will have severe nausea and vomiting, epigastric pain, and diarrhea. She works Midwife for [employer], and says that every time she has a Environmental consultant fair coming up she gets nervous 2 or 3 days in advance and begins to have nausea, vomiting, epigastric pain, and diarrhea. Once the event is over, she feels better. This happens to her every time she has to fly as well. Over the past few weeks however she has had what she refers to as a dull nausea each morning. She readily admits that she has been taking her pantoprazole at night. She has also been eating right before she goes to bed. She continues to smoke marijuana on a fairly regular basis. She is interested in scheduling of screening colonoscopy. She  states she had a colonoscopy sometime between 2002 and 2004 with [facility] gastroenterology that she says was normal.   Past Medical History  Diagnosis Date  . Hiatal hernia   . IBS (irritable bowel syndrome)   . Diverticulosis   . Bipolar affective disorder ([place])   . GERD (gastroesophageal reflux disease)   . Chronic headaches   . Depression   . Stroke ([place])   . Endometriosis   . Cyclical vomiting syndrome   . Abdominal pain     chronic, recurrent    Past Surgical History  Procedure Laterality Date  . Abdominal hysterectomy  1990  . Breast lumpectomy Bilateral   . Foot fracture surgery Right     x 3  . Toe surgery Left   . Nissen fundoplication      hiatal hernia repair   Family History  Problem Relation Age of Onset  . Brain cancer Mother   . Breast cancer Sister   . Breast cancer Paternal Grandmother   . Diabetes Mother   . Diabetes Sister     x 2   Social History  Substance Use Topics  . Smoking status: Never Smoker   . Smokeless tobacco: Never Used  . Alcohol Use: No   Current Outpatient Prescriptions  Medication Sig Dispense Refill  . aspirin EC 325 MG tablet Take 325 mg by mouth daily.    . cyclobenzaprine (FLEXERIL) 10 MG tablet Take 10 mg by mouth 3 (three) times daily as needed for muscle spasms. For muscle spasms in back    .  diclofenac sodium (VOLTAREN) 1 % GEL Apply 1 g topically 3 (three) times daily as needed (back and elbow pain .). To back and elbow    . dicyclomine (BENTYL) 20 MG tablet Take 20 mg by mouth 3 (three) times daily as needed. For intestinal spasms    . feeding supplement (BOOST / RESOURCE BREEZE) LIQD Take 1 Container by mouth 3 (three) times daily between meals. 10 Container 1  . fluticasone (FLONASE) 50 MCG/ACT nasal spray Place 2 sprays into the nose daily as needed for allergies.     Marland Kitchen gabapentin (NEURONTIN) 300 MG capsule Take 900 mg by mouth 3 (three) times daily as needed (nerve pain).     Marland Kitchen lamoTRIgine (LAMICTAL) 150 MG tablet  Take 150 mg by mouth daily.     Marland Kitchen loratadine (CLARITIN) 10 MG tablet Take 1 tablet by mouth daily.    . pantoprazole (PROTONIX) 40 MG tablet Take 1 tablet (40 mg total) by mouth daily. 30 mins prior to breakfast 90 tablet 1  . perphenazine (TRILAFON) 4 MG tablet Take 4 mg by mouth 3 (three) times daily.     . pramipexole (MIRAPEX) 0.125 MG tablet Take 0.125 mg by mouth at bedtime.    . promethazine (PHENERGAN) 25 MG suppository Place 1 suppository (25 mg total) rectally every 6 (six) hours as needed for nausea or vomiting. 12 each 0  . promethazine (PHENERGAN) 25 MG tablet Take 1 tablet (25 mg total) by mouth every 6 (six) hours as needed for nausea or vomiting. 30 tablet 1  . venlafaxine XR (EFFEXOR-XR) 75 MG 24 hr capsule Take 75 mg by mouth daily.    . Na Sulfate-K Sulfate-Mg Sulf SOLN Take 1 kit by mouth once. As directed for colonoscopy 354 mL 0  . sucralfate (CARAFATE) 1 GM/10ML suspension Take 2 tsp by mouth prior to meals and at bedtime as needed 420 mL 0   No current facility-administered medications for this visit.   No Known Allergies   Review of Systems: Gen: Denies any fever, chills, sweats, anorexia, fatigue, weakness, malaise, weight loss, and sleep disorder CV: Denies chest pain, angina, palpitations, syncope, orthopnea, PND, peripheral edema, and claudication. Resp: Denies dyspnea at rest, dyspnea with exercise, cough, sputum, wheezing, coughing up blood, and pleurisy. GI: Denies vomiting blood, jaundice, and fecal incontinence.   Denies dysphagia or odynophagia. GU : Denies urinary burning, blood in urine, urinary frequency, urinary hesitancy, nocturnal urination, and urinary incontinence. MS: Denies joint pain, limitation of movement, and swelling, stiffness, low back pain, extremity pain. Denies muscle weakness, cramps, atrophy.  Derm: Denies rash, itching, dry skin, hives, moles, warts, or unhealing ulcers.  Psych: Denies depression, anxiety, memory loss, suicidal  ideation, hallucinations, paranoia, and confusion. Heme: Denies bruising, bleeding, and enlarged lymph nodes. Neuro:  Denies any headaches, dizziness, paresthesia Endo:  Denies any problems with DM, thyroid, adrenal  LAB RESULTS: Comprehensive metabolic panel on 16/55/3748 alkaline phosphatase 75, total bili 1, ALT 13, AST 47. Lipase 17. CBC 07/22/2015 showed white count 19.3, hemoglobin 14.9, hematocrit 42.1, platelet clumps noted.   Studies:   Dg Chest 2 View  07/17/2015   CLINICAL DATA:  Nausea vomiting abdominal pain since 05/30 this morning  EXAM: CHEST  2 VIEW  COMPARISON:  09/14/2014  FINDINGS: Heart size and vascular pattern are normal. Lungs are clear. No effusions.  IMPRESSION: No acute findings.   Electronically Signed   By: Skipper Cliche M.D.   On: 07/17/2015 13:14   Ct Abdomen Pelvis W Contrast  07/17/2015   CLINICAL DATA:  Acute abdominal pain with nausea and vomiting. Prior hiatal hernia repair and hysterectomy.  EXAM: CT ABDOMEN AND PELVIS WITH CONTRAST  TECHNIQUE: Multidetector CT imaging of the abdomen and pelvis was performed using the standard protocol following bolus administration of intravenous contrast.  CONTRAST:  155m OMNIPAQUE IOHEXOL 300 MG/ML SOLN, 244mOMNIPAQUE IOHEXOL 300 MG/ML SOLN  COMPARISON:  02/25/2015  FINDINGS: Lower chest: Stable small 4 mm inferior right middle lobe nodule, image 2. Minor right base atelectasis versus scarring. Lung bases otherwise clear. No lower lobe pneumonia. Normal heart size. No pericardial or pleural effusion. Postop changes at the GE junction from hiatal hernia repair. No recurrent hernia evident.  Stable 9 mm hypodense cyst in the left hepatic lobe lateral segment, image 20. No other hepatic abnormality or biliary dilatation. Patent portal vein. Gallbladder, biliary system, pancreas, spleen, adrenal glands, and kidneys are within normal limits for age and demonstrate no acute process. Incidental stable punctate tiny nonobstructing  calculi in the left kidney lower pole collecting system.  Negative for bowel obstruction, dilatation, ileus, or free air.  No abdominal free fluid, fluid collection, hemorrhage, abscess, or adenopathy. Aortoiliac atherosclerosis noted without aneurysm or occlusive process. No retroperitoneal abnormality.  Scattered diverticulosis throughout the colon. Colon is decompressed accounting for the slight wall prominence. Several hyperdense appendicoliths noted. No definite surrounding inflammatory process. Terminal ileum unremarkable.  Pelvis: Prior hysterectomy noted. Urinary bladder unremarkable. Diverticulosis of the distal colon. No pelvic free fluid, fluid collection, hemorrhage, abscess, adenopathy, inguinal abnormality, or hernia.  no acute osseous finding.  IMPRESSION: Prior hiatal hernia repair and hysterectomy.  Stable 9 mm left hepatic hypodense cyst  Punctate nonobstructing left nephrolithiasis  Scattered colonic diverticulosis  Several hyperdense appendicoliths but no signs of acute appendicitis  Aortoiliac atherosclerosis without aneurysm   Electronically Signed   By: M.Jerilynn Mages Shick M.D.   On: 07/17/2015 14:16     Physical Exam: BP 110/62 mmHg  Pulse 72  Ht '5\' 6"'  (1.676 m)  Wt 151 lb 9.6 oz (68.765 kg)  BMI 24.48 kg/m2 General: Pleasant, well developed ,female in no acute distress Head: Normocephalic and atraumatic Eyes:  sclerae anicteric, conjunctiva pink  Ears: Normal auditory acuity Lungs: Clear throughout to auscultation Heart: Regular rate and rhythm Abdomen: Soft, non distended, non-tender. No masses, no hepatomegaly. Normal bowel sounds Musculoskeletal: Symmetrical with no gross deformities  Extremities: No edema  Neurological: Alert oriented x 4, grossly nonfocal Psychological:  Alert and cooperative. Normal mood and affect  Assessment and Recommendations: #1. Nausea and GERD. This has been a long-standing problem for the patient. Her nausea may be exacerbated by her use of  marijuana and she has been encouraged to discontinue this practice. Continue when necessary use of promethazine. She has been instructed to use her pantoprazole 40 mg 1 by mouth every morning 30 minutes prior to breakfast. She has been encouraged to avoid eating or drinking for 2 hours before she goes to bed in the evening. She will be given a trial of Carafate 1 g before meals and at bedtime when necessary. A repeat CBC and hepatic function panel will be obtained.  #2. IBS. Patient seems to have worsening nausea vomiting diarrhea and pain and periods of stress. She does note that Bentyl decreases these symptoms. She's been instructed to use her Bentyl prophylactically 2-3 days before be given its and continue using it through the event as needed.  #3. Colorectal cancer screening. She will be scheduled for colonoscopy to evaluate for  polyps, neoplasia, or other intraluminal pathology.The risks, benefits, and alternatives to colonoscopy with possible biopsy and possible polypectomy were discussed with the patient and they consent to proceed.    Further recommendations will be made pending the findings of the above.     Chanc Kervin, Vita Barley PA-C 08/05/2015,

## 2015-08-05 NOTE — Telephone Encounter (Signed)
-----   Message from [employer], PA-C sent at 08/05/2015 11:48 AM EDT ----- Please have pt go for a cbc with diff and hepatic function panel. Thank you.

## 2015-08-05 NOTE — Patient Instructions (Addendum)
We have sent the following medications to your pharmacy for you to pick up at your convenience: Pantoprazole, Promethazine, Carafate Please use Bentyl prophylactic 2-3 days before big events  You have been scheduled for a colonoscopy. Please follow written instructions given to you at your visit today.  Please pick up your prep supplies at the pharmacy within the next 1-3 days. If you use inhalers (even only as needed), please bring them with you on the day of your procedure.    Food Choices for Gastroesophageal Reflux Disease, Adult When you have gastroesophageal reflux disease (GERD), the foods you eat and your eating habits are very important. Choosing the right foods can help ease the discomfort of GERD. WHAT GENERAL GUIDELINES DO I NEED TO FOLLOW?  Choose fruits, vegetables, whole grains, low-fat dairy products, and low-fat meat, fish, and poultry.  Limit fats such as oils, salad dressings, butter, nuts, and avocado.  Keep a food diary to identify foods that cause symptoms.  Avoid foods that cause reflux. These may be different for different people.  Eat frequent small meals instead of three large meals each day.  Eat your meals slowly, in a relaxed setting.  Limit fried foods.  Cook foods using methods other than frying.  Avoid drinking alcohol.  Avoid drinking large amounts of liquids with your meals.  Avoid bending over or lying down until 2-3 hours after eating. WHAT FOODS ARE NOT RECOMMENDED? The following are some foods and drinks that may worsen your symptoms: Vegetables Tomatoes. Tomato juice. Tomato and spaghetti sauce. Chili peppers. Onion and garlic. Horseradish. Fruits Oranges, grapefruit, and lemon (fruit and juice). Meats High-fat meats, fish, and poultry. This includes hot dogs, ribs, ham, sausage, salami, and bacon. Dairy Whole milk and chocolate milk. Sour cream. Cream. Butter. Ice cream. Cream cheese.  Beverages Coffee and tea, with or without  caffeine. Carbonated beverages or energy drinks. Condiments Hot sauce. Barbecue sauce.  Sweets/Desserts Chocolate and cocoa. Donuts. Peppermint and spearmint. Fats and Oils High-fat foods, including [place] fries and potato chips. Other Vinegar. Strong spices, such as black pepper, white pepper, red pepper, cayenne, curry powder, cloves, ginger, and chili powder. The items listed above may not be a complete list of foods and beverages to avoid. Contact your dietitian for more information.   This information is not intended to replace advice given to you by your health care provider. Make sure you discuss any questions you have with your health care provider.   Document Released: 10/17/2005 Document Revised: 11/07/2014 Document Reviewed: 08/21/2013 Elsevier Interactive Patient Education [employer].  We have requested your medical records from [facility]

## 2015-08-05 NOTE — Progress Notes (Signed)
Agree with initial assessment, recommendations, and plans as outlined

## 2015-08-05 NOTE — Telephone Encounter (Signed)
Labs placed in Epic and msg left for patient to go down to the basement to get her labs drawn. Patient to call back with any question or concerns

## 2015-08-07 MED ORDER — SUCRALFATE 1 G PO TABS: ORAL_TABLET | ORAL | Status: DC

## 2015-08-07 MED ORDER — PROMETHAZINE HCL 25 MG RE SUPP: 25.0000 mg | Freq: Three times a day (TID) | RECTAL | Status: DC | PRN

## 2015-08-07 NOTE — Telephone Encounter (Signed)
Spoke to patient pharmacy and insurance and Carafate is not on her formulary. They state that Sucralfate is covered but only in tablet not suspension. Would you like for use to send in for tablets or do a PA for the suspension? She is also requesting a refill on promethazine suppository, we sent in for promethazine tabs at her office visit. Ok to send in for the promethazine suppository? Thank you

## 2015-08-07 NOTE — Telephone Encounter (Signed)
Can send script fot sulcralfate tabs 1 g, 1 po ac and hs prn. Ok to send script for promethazine suppositories, 25 mg, 1 per rectum q 8 hours prn nausea, # 30 with no refill.

## 2015-08-07 NOTE — Telephone Encounter (Signed)
Rx sent to pharmacy and patient made aware. Patient has no question or concerns at this time.

## 2015-09-02 ENCOUNTER — Other Ambulatory Visit: Payer: Self-pay | Admitting: Physician Assistant

## 2015-09-02 NOTE — Telephone Encounter (Signed)
Refill request for Promethazine 25 mg. Patient has episodes of nausea 3-5 days at a time.  Patient is taking the Carafate tablets, prescribed on 08-07-2015 by Cecille Rubin Hvozdovic PA.

## 2015-09-02 NOTE — Telephone Encounter (Signed)
Pam, you can refill x 1.

## 2015-09-03 ENCOUNTER — Telehealth: Payer: Self-pay | Admitting: Physician Assistant

## 2015-09-03 NOTE — Telephone Encounter (Signed)
Patient states she started with nausea and then last night had vomiting. States she is dehydrated now. She will go to Urgent Care or ED for possible IVF.

## 2015-09-04 NOTE — Telephone Encounter (Signed)
See note from 09-03-2015.

## 2015-09-22 ENCOUNTER — Other Ambulatory Visit: Payer: Self-pay | Admitting: Physician Assistant

## 2015-09-29 ENCOUNTER — Other Ambulatory Visit (INDEPENDENT_AMBULATORY_CARE_PROVIDER_SITE_OTHER): Payer: Medicare HMO

## 2015-09-29 DIAGNOSIS — R110 Nausea: Secondary | ICD-10-CM

## 2015-09-29 DIAGNOSIS — K219 Gastro-esophageal reflux disease without esophagitis: Secondary | ICD-10-CM

## 2015-09-29 LAB — CBC WITH DIFFERENTIAL/PLATELET
BASOS ABS: 0.1 10*3/uL (ref 0.0–0.1)
Basophils Relative: 0.5 % (ref 0.0–3.0)
EOS ABS: 0.2 10*3/uL (ref 0.0–0.7)
EOS PCT: 1.9 % (ref 0.0–5.0)
HCT: 42.4 % (ref 36.0–46.0)
HEMOGLOBIN: 14.4 g/dL (ref 12.0–15.0)
LYMPHS ABS: 3.8 10*3/uL (ref 0.7–4.0)
Lymphocytes Relative: 34.8 % (ref 12.0–46.0)
MCHC: 34 g/dL (ref 30.0–36.0)
MCV: 99.2 fl (ref 78.0–100.0)
MONO ABS: 0.8 10*3/uL (ref 0.1–1.0)
Monocytes Relative: 7.1 % (ref 3.0–12.0)
NEUTROS PCT: 55.7 % (ref 43.0–77.0)
Neutro Abs: 6.1 10*3/uL (ref 1.4–7.7)
Platelets: 256 10*3/uL (ref 150.0–400.0)
RBC: 4.28 Mil/uL (ref 3.87–5.11)
RDW: 14.1 % (ref 11.5–15.5)
WBC: 11 10*3/uL — AB (ref 4.0–10.5)

## 2015-09-29 LAB — HEPATIC FUNCTION PANEL
ALK PHOS: 83 U/L (ref 39–117)
ALT: 8 U/L (ref 0–35)
AST: 12 U/L (ref 0–37)
Albumin: 4 g/dL (ref 3.5–5.2)
BILIRUBIN DIRECT: 0 mg/dL (ref 0.0–0.3)
BILIRUBIN TOTAL: 0.4 mg/dL (ref 0.2–1.2)
Total Protein: 6.6 g/dL (ref 6.0–8.3)

## 2015-10-02 ENCOUNTER — Telehealth: Payer: Self-pay | Admitting: Physician Assistant

## 2015-10-02 NOTE — Telephone Encounter (Signed)
Left message for patient to call back  

## 2015-10-06 NOTE — Telephone Encounter (Signed)
Patient given results. See results note. 

## 2015-10-07 ENCOUNTER — Ambulatory Visit (AMBULATORY_SURGERY_CENTER): Payer: Medicare HMO | Admitting: Internal Medicine

## 2015-10-07 ENCOUNTER — Encounter: Payer: Self-pay | Admitting: Internal Medicine

## 2015-10-07 VITALS — BP 128/88 | HR 59 | Temp 96.7°F | Resp 16 | Ht 66.0 in | Wt 151.0 lb

## 2015-10-07 DIAGNOSIS — D120 Benign neoplasm of cecum: Secondary | ICD-10-CM

## 2015-10-07 DIAGNOSIS — Z1211 Encounter for screening for malignant neoplasm of colon: Secondary | ICD-10-CM | POA: Diagnosis not present

## 2015-10-07 HISTORY — PX: COLONOSCOPY: SHX174

## 2015-10-07 MED ORDER — SODIUM CHLORIDE 0.9 % IV SOLN: 500.0000 mL | INTRAVENOUS | Status: DC

## 2015-10-07 NOTE — Progress Notes (Signed)
To recovery, report to Myers, RN, VSS. 

## 2015-10-07 NOTE — Progress Notes (Signed)
Called to room to assist during endoscopic procedure.  Patient ID and intended procedure confirmed with present staff. Received instructions for my participation in the procedure from the performing physician.  

## 2015-10-07 NOTE — Op Note (Signed)
[facility]  [address] [place], [id number]   COLONOSCOPY PROCEDURE REPORT  PATIENT: [name], [name]  MR#: [id number] BIRTHDATE: 1956-02-12 , 84  yrs. old GENDER: female ENDOSCOPIST: Eustace Quail, MD REFERRED [id number] [place], . PROCEDURE DATE:  10/07/2015 PROCEDURE:   Colonoscopy, screening and Colonoscopy with snare polypectomy x 2 First Screening Colonoscopy - Avg.  risk and is 50 yrs.  old or older - No.  Prior Negative Screening - Now for repeat screening. 10 or more years since last screening  History of Adenoma - Now for follow-up colonoscopy & has been > or = to 3 yrs.  N/A  Polyps removed today? Yes ASA CLASS:   Class II INDICATIONS:Screening for colonic neoplasia and Colorectal Neoplasm Risk Assessment for this procedure is average risk. Reports colonoscopy elsewhere 10+ years ago. Not aware of abnormalities. No report. MEDICATIONS: Monitored anesthesia care and Propofol 350 mg IV  DESCRIPTION OF PROCEDURE:   After the risks benefits and alternatives of the procedure were thoroughly explained, informed consent was obtained.  The digital rectal exam revealed no abnormalities of the rectum.   The LB [id number]  endoscope was introduced through the anus and advanced to the cecum, which was identified by both the appendix and ileocecal valve. No adverse events experienced.   The quality of the prep was excellent. (Suprep was used)  The instrument was then slowly withdrawn as the colon was fully examined. Estimated blood loss is zero unless otherwise noted in this procedure report.  COLON FINDINGS: Two polyps ranging between 3-54mm in size were found at the cecum.  A polypectomy was performed with a cold snare.  The resection was complete, the polyp tissue was completely retrieved and sent to histology.   There was moderate diverticulosis noted in the right colon and left colon.   The examination was otherwise normal.  Retroflexed views  revealed no abnormalities. The time to cecum = 4.5 Withdrawal time = 10.3   The scope was withdrawn and the procedure completed. COMPLICATIONS: There were no immediate complications.  ENDOSCOPIC IMPRESSION: 1.   Two polyps ranging between 3-69mm in size were found at the cecum; polypectomy was performed with a cold snare 2.   Moderate diverticulosis was noted in the right colon and left colon 3.   The examination was otherwise normal  RECOMMENDATIONS: 1. Follow up colonoscopy in 5 years  eSigned:  Eustace Quail, MD 10/07/2015 11:07 AM   cc: Selina Cooley,  and The Patient

## 2015-10-07 NOTE — Patient Instructions (Signed)
Colon polyps x 2 removed today, and diverticulosis seen. Handouts given: polyps,diverticulosis,high fiber diet.  Repeat colonoscopy in 5 years.   YOU HAD AN ENDOSCOPIC PROCEDURE TODAY AT [facility]:   Refer to the procedure report that was given to you for any specific questions about what was found during the examination.  If the procedure report does not answer your questions, please call your gastroenterologist to clarify.  If you requested that your care partner not be given the details of your procedure findings, then the procedure report has been included in a sealed envelope for you to review at your convenience later.  YOU SHOULD EXPECT: Some feelings of bloating in the abdomen. Passage of more gas than usual.  Walking can help get rid of the air that was put into your GI tract during the procedure and reduce the bloating. If you had a lower endoscopy (such as a colonoscopy or flexible sigmoidoscopy) you may notice spotting of blood in your stool or on the toilet paper. If you underwent a bowel prep for your procedure, you may not have a normal bowel movement for a few days.  Please Note:  You might notice some irritation and congestion in your nose or some drainage.  This is from the oxygen used during your procedure.  There is no need for concern and it should clear up in a day or so.  SYMPTOMS TO REPORT IMMEDIATELY:   Following lower endoscopy (colonoscopy or flexible sigmoidoscopy):  Excessive amounts of blood in the stool  Significant tenderness or worsening of abdominal pains  Swelling of the abdomen that is new, acute  Fever of 100F or higher  For urgent or emergent issues, a gastroenterologist can be reached at any hour by calling [phone].   DIET: Your first meal following the procedure should be a small meal and then it is ok to progress to your normal diet. Heavy or fried foods are harder to digest and may make you feel nauseous or bloated.  Likewise,  meals heavy in dairy and vegetables can increase bloating.  Drink plenty of fluids but you should avoid alcoholic beverages for 24 hours.  ACTIVITY:  You should plan to take it easy for the rest of today and you should NOT DRIVE or use heavy machinery until tomorrow (because of the sedation medicines used during the test).    FOLLOW UP: Our staff will call the number listed on your records the next business day following your procedure to check on you and address any questions or concerns that you may have regarding the information given to you following your procedure. If we do not reach you, we will leave a message.  However, if you are feeling well and you are not experiencing any problems, there is no need to return our call.  We will assume that you have returned to your regular daily activities without incident.  If any biopsies were taken you will be contacted by phone or by letter within the next 1-3 weeks.  Please call us at [phone] if you have not heard about the biopsies in 3 weeks.    SIGNATURES/CONFIDENTIALITY: You and/or your care partner have signed paperwork which will be entered into your electronic medical record.  These signatures attest to the fact that that the information above on your After Visit Summary has been reviewed and is understood.  Full responsibility of the confidentiality of this discharge information lies with you and/or your care-partner.

## 2015-10-08 ENCOUNTER — Telehealth: Payer: Self-pay

## 2015-10-08 NOTE — Telephone Encounter (Signed)
  Follow up Call-  Call back number 10/07/2015  Post procedure Call Back phone  # [phone]  Permission to leave phone message Yes     Patient questions:  Do you have a fever, pain , or abdominal swelling? No. Pain Score  0 *  Have you tolerated food without any problems? Yes.    Have you been able to return to your normal activities? Yes.    Do you have any questions about your discharge instructions: Diet   No. Medications  No. Follow up visit  No.  Do you have questions or concerns about your Care? No.  Actions: * If pain score is 4 or above: No action needed, pain <4.

## 2015-10-09 ENCOUNTER — Other Ambulatory Visit: Payer: Self-pay | Admitting: Physician Assistant

## 2015-10-12 ENCOUNTER — Encounter: Payer: Self-pay | Admitting: Internal Medicine

## 2015-10-28 ENCOUNTER — Other Ambulatory Visit: Payer: Self-pay | Admitting: Internal Medicine

## 2015-10-28 ENCOUNTER — Other Ambulatory Visit: Payer: Self-pay | Admitting: Physician Assistant

## 2015-10-28 NOTE — Telephone Encounter (Signed)
[name] is this ok to refill?

## 2015-11-03 ENCOUNTER — Telehealth: Payer: Self-pay

## 2015-11-03 NOTE — Telephone Encounter (Signed)
Pt scheduled to see Dr. Henrene Pastor 11/23/15@3 :15pm. Appt letter mailed to pt.

## 2015-11-03 NOTE — Telephone Encounter (Signed)
-----   Message from Vita Barley Hvozdovic, PA-C sent at 11/01/2015  9:13 PM EST ----- Pt was een for nausea, had colonoscopy. Has requested refills on antiemetics, which were granted. She should be scheduled for a f/u with Dr Henrene Pastor, next available.

## 2015-11-23 ENCOUNTER — Ambulatory Visit: Payer: Medicare HMO | Admitting: Internal Medicine

## 2015-12-02 ENCOUNTER — Encounter: Payer: Self-pay | Admitting: Internal Medicine

## 2015-12-14 ENCOUNTER — Ambulatory Visit (INDEPENDENT_AMBULATORY_CARE_PROVIDER_SITE_OTHER): Payer: Medicare HMO | Admitting: Internal Medicine

## 2015-12-14 ENCOUNTER — Encounter: Payer: Self-pay | Admitting: Internal Medicine

## 2015-12-14 VITALS — BP 104/60 | HR 60 | Ht 66.0 in | Wt 156.8 lb

## 2015-12-14 DIAGNOSIS — K219 Gastro-esophageal reflux disease without esophagitis: Secondary | ICD-10-CM | POA: Diagnosis not present

## 2015-12-14 DIAGNOSIS — R110 Nausea: Secondary | ICD-10-CM

## 2015-12-14 DIAGNOSIS — K589 Irritable bowel syndrome without diarrhea: Secondary | ICD-10-CM | POA: Diagnosis not present

## 2015-12-14 MED ORDER — PANTOPRAZOLE SODIUM 40 MG PO TBEC: 40.0000 mg | DELAYED_RELEASE_TABLET | Freq: Every day | ORAL | Status: AC

## 2015-12-14 MED ORDER — PROMETHAZINE HCL 25 MG PO TABS: ORAL_TABLET | ORAL | Status: DC

## 2015-12-14 MED ORDER — PROMETHAZINE HCL 25 MG RE SUPP: RECTAL | Status: DC

## 2015-12-14 NOTE — Patient Instructions (Signed)
We have sent the following medications to your pharmacy for you to pick up at your convenience: Phenergan and Protonix

## 2015-12-14 NOTE — Progress Notes (Signed)
HISTORY OF PRESENT ILLNESS:  [name] is a 60 y.o. female with a history of GERD, IBS, bipolar disorder, chronic headaches, prior stroke, recurrent problems with nausea, vomiting, and epigastric pain. Repair of paraesophageal hernia with mesh and gastropexy November 2015 elsewhere. Chronic cannabis use. Last evaluated 10/07/2015 for screening colonoscopy. Found to have subcentimeter polyps which were tubular adenoma and sessile serrated. Follow-up in 5 years recommended. She presented today for follow-up. Patient reports that since her surgery her upper GI symptoms are 75% improved. She is trying to refrain from chronic cannabis use. Continues with episodic nausea with rare vomiting. She uses Phenergan tablets and Phenergan suppositories. Request refill. She is also maintained on PPI for GERD. She continues with alternating bowel habits. Abdominal cramping has been treated with dicyclomine which does not help.. Abdomen placed on Carafate for nausea, which did not help. No bleeding. She has had improved appetite and weight gain. No new GI complaints in addition to her ongoing chronic complaints.  REVIEW OF SYSTEMS:  All non-GI ROS negative upon review  Past Medical History  Diagnosis Date  . Hiatal hernia   . IBS (irritable bowel syndrome)   . Diverticulosis   . Bipolar affective disorder ([place])   . GERD (gastroesophageal reflux disease)   . Chronic headaches   . Depression   . Endometriosis   . Cyclical vomiting syndrome   . Abdominal pain     chronic, recurrent  . Stroke ([place]) 12/2013  . Allergy   . Status post repair of paraesophageal diaphragmatic hernia   . Hiatal hernia   . Colon polyps     adenomatous    Past Surgical History  Procedure Laterality Date  . Abdominal hysterectomy  1990  . Breast lumpectomy Bilateral   . Foot fracture surgery Right     x 3  . Toe surgery Left   . Nissen fundoplication  [id number]    hiatal hernia repair    Social History [employer]  reports that she has never smoked. She has never used smokeless tobacco. She reports that she uses illicit drugs (Marijuana). She reports that she does not drink alcohol.  family history includes Brain cancer in her mother; Breast cancer in her paternal grandmother and sister; Diabetes in her mother and sister. There is no history of Colon cancer.  No Known Allergies     PHYSICAL EXAMINATION: Vital signs: BP 104/60 mmHg  Pulse 60  Ht 5\' 6"  (1.676 m)  Wt 156 lb 12.8 oz (71.124 kg)  BMI 25.32 kg/m2  Constitutional: generally well-appearing, no acute distress Psychiatric: alert and oriented x3, cooperative Eyes: extraocular movements intact, anicteric, conjunctiva pink Mouth: oral pharynx moist, no lesions Neck: supple no lymphadenopathy Cardiovascular: heart regular rate and rhythm, no murmur Lungs: clear to auscultation bilaterally Abdomen: soft, nontender, nondistended, no obvious ascites, no peritoneal signs, normal bowel sounds, no organomegaly Rectal: Ommitted Extremities: no clubbing, cyanosis, or lower extremity edema bilaterally Skin: no lesions on visible extremities Neuro: No focal deficits. Cranial nerves intact    ASSESSMENT:  #1. Chronic nausea and vomiting. At this point most a functional and likely related to chronic cannabis #2. History of paraesophageal hernia requiring emergent repair elsewhere. Many of her upper GI symptoms improved thereafter #3. IBS #4. History of adenomatous colon polyps   PLAN:  #1. Continue PPI. Refilled.  #2. Stop cannabis. Advised #3. Continue antiemetics. Refilled #4. Recommended daily fiber supplementation in the form of Metamucil for alternating bowel habits and cramps #5. Surveillance colonoscopy  5 years #6. Routine GI office follow-up one year  25 minutes was spent face-to-face with the patient regarding her chronic GI issues. Greater than 50% of the time use for counseling regarding etiology and treatment  recommendations

## 2016-09-24 ENCOUNTER — Encounter (HOSPITAL_COMMUNITY): Payer: Self-pay | Admitting: Emergency Medicine

## 2016-09-24 ENCOUNTER — Ambulatory Visit (INDEPENDENT_AMBULATORY_CARE_PROVIDER_SITE_OTHER): Payer: Medicare HMO

## 2016-09-24 ENCOUNTER — Ambulatory Visit (HOSPITAL_COMMUNITY)
Admission: EM | Admit: 2016-09-24 | Discharge: 2016-09-24 | Disposition: A | Discharge status: Home / Self Care | Payer: Medicare HMO | Attending: Internal Medicine | Admitting: Internal Medicine

## 2016-09-24 DIAGNOSIS — S6991XA Unspecified injury of right wrist, hand and finger(s), initial encounter: Secondary | ICD-10-CM

## 2016-09-24 DIAGNOSIS — S60511A Abrasion of right hand, initial encounter: Secondary | ICD-10-CM

## 2016-09-24 MED ORDER — NAPROXEN 500 MG PO TABS: 500.0000 mg | ORAL_TABLET | Freq: Two times a day (BID) | ORAL | 0 refills | Status: DC

## 2016-09-24 MED ORDER — LIDOCAINE-EPINEPHRINE-TETRACAINE (LET) SOLUTION
3.0000 mL | Freq: Once | NASAL | Status: AC
  Administered 2016-09-24: 3 mL via TOPICAL

## 2016-09-24 MED ORDER — KETOROLAC TROMETHAMINE 60 MG/2ML IM SOLN
60.0000 mg | Freq: Once | INTRAMUSCULAR | Status: AC
  Administered 2016-09-24: 60 mg via INTRAMUSCULAR

## 2016-09-24 MED ORDER — LIDOCAINE-EPINEPHRINE-TETRACAINE (LET) SOLUTION
NASAL | Status: AC
  Filled 2016-09-24: qty 3

## 2016-09-24 MED ORDER — KETOROLAC TROMETHAMINE 60 MG/2ML IM SOLN
INTRAMUSCULAR | Status: AC
  Filled 2016-09-24: qty 2

## 2016-09-24 MED ORDER — HYDROCODONE-ACETAMINOPHEN 5-325 MG PO TABS: 1.0000 | ORAL_TABLET | Freq: Four times a day (QID) | ORAL | 0 refills | Status: DC | PRN

## 2016-09-24 NOTE — Discharge Instructions (Addendum)
Wear splint all the time until followup with orthopedist in 7-10 days.  Ice and elevation may help with swelling/pain, 5-10 minutes 2-4 times daily. Prescription for naproxen was sent to the [facility] on [facility]; prescription for a small number of vicodin was printed.  Wash scraped area with soap/water gently 1-2 times daily and apply antibiotic ointment/bandage.  Recheck for any increasing redness/swelling/pain/drainage or new fever >100.5.

## 2016-09-24 NOTE — ED Triage Notes (Signed)
Pt reports she fell today and inj her right hand/wrist onset 1130 today  Sx include: swelling and pain... Pain increases w/activity  A&O x4... NAD

## 2016-09-24 NOTE — ED Provider Notes (Signed)
[place]    CSN: [id number] Arrival date & time: 09/24/16  1713     History   Chief Complaint Chief Complaint  Patient presents with  . Hand Injury    HPI [name] is a 60 y.o. female. Patient fell at a craft fair this morning, trying to catch something that was blowing away. She landed with all of her weight on her right wrist, and his head pain since with mild swelling, bruising, at the base of the thumb and the radial aspect of the wrist. She also has an abrasion on the palm at the distal head of the fifth metacarpal.  HPI  Past Medical History:  Diagnosis Date  . Abdominal pain    chronic, recurrent  . Allergy   . Bipolar affective disorder ([place])   . Chronic headaches   . Colon polyps    adenomatous  . Cyclical vomiting syndrome   . Depression   . Diverticulosis   . Endometriosis   . GERD (gastroesophageal reflux disease)   . Hiatal hernia   . Hiatal hernia   . IBS (irritable bowel syndrome)   . Status post repair of paraesophageal diaphragmatic hernia   . Stroke [facility]) 12/2013    Patient Active Problem List   Diagnosis Date Noted  . Cyclical vomiting with nausea 07/17/2015  . Cyclic vomiting syndrome 04/03/2014  . CVA (cerebral infarction) 01/29/2014  . Polysubstance abuse 01/29/2014  . Intractable nausea and vomiting 11/16/2013  . N&V (nausea and vomiting) 02/03/2013  . Bipolar affective disorder ([place])   . GERD (gastroesophageal reflux disease)   . 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 HEMATEMESIS 11/15/2006  . ENDOMETRIOSIS 11/15/2006  . DEPRESSION 09/05/2006  . MIGRAINE HEADACHE 09/05/2006  . IBS 09/05/2006  . CERVICALGIA 09/05/2006    Past Surgical History:  Procedure Laterality Date  . ABDOMINAL HYSTERECTOMY  1990  . BREAST LUMPECTOMY Bilateral   . FOOT FRACTURE SURGERY Right    x 3  . NISSEN FUNDOPLICATION  123456   hiatal hernia repair  . TOE SURGERY Left      Home Medications    Prior to Admission  medications   Medication Sig Start Date End Date Taking? Authorizing Provider  aspirin EC 325 MG tablet Take 325 mg by mouth daily.   Yes Historical Provider, MD  cyclobenzaprine (FLEXERIL) 10 MG tablet Take 10 mg by mouth 3 (three) times daily as needed for muscle spasms. For muscle spasms in back   Yes Historical Provider, MD  diclofenac sodium (VOLTAREN) 1 % GEL Apply 1 g topically 3 (three) times daily as needed (back and elbow pain .). To back and elbow   Yes Historical Provider, MD  dicyclomine (BENTYL) 20 MG tablet Take 20 mg by mouth 3 (three) times daily as needed. For intestinal spasms   Yes Historical Provider, MD  feeding supplement (BOOST / RESOURCE BREEZE) LIQD Take 1 Container by mouth 3 (three) times daily between meals. 07/18/15  Yes Reyne Dumas, MD  fluticasone (FLONASE) 50 MCG/ACT nasal spray Place 2 sprays into the nose daily as needed for allergies.    Yes Historical Provider, MD  gabapentin (NEURONTIN) 300 MG capsule Take 900 mg by mouth 3 (three) times daily as needed (nerve pain).  04/23/15  Yes Historical Provider, MD  lamoTRIgine (LAMICTAL) 150 MG tablet Take 150 mg by mouth daily.    Yes Historical Provider, MD  pantoprazole (PROTONIX) 40 MG tablet Take 1 tablet (40 mg total) by mouth daily. 12/14/15  Yes  Irene Shipper, MD  perphenazine (TRILAFON) 4 MG tablet Take 4 mg by mouth 3 (three) times daily.    Yes Historical Provider, MD  pramipexole (MIRAPEX) 0.125 MG tablet Take 0.125 mg by mouth at bedtime.   Yes Historical Provider, MD  promethazine (PHENERGAN) 25 MG suppository INSERT 1 SUPPOSITORY INTO RECTUM EVERY 8 HOURS AS NEEDED FOR NAUSEA OR VOMITING 12/14/15  Yes Irene Shipper, MD  promethazine (PHENERGAN) 25 MG tablet TAKE 1 TABLET (25 MG TOTAL) BY MOUTH EVERY 6 (SIX) HOURS AS NEEDED FOR NAUSEA OR VOMITING. 12/14/15  Yes Irene Shipper, MD  venlafaxine XR (EFFEXOR-XR) 75 MG 24 hr capsule Take 75 mg by mouth daily.   Yes Historical Provider, MD  HYDROcodone-acetaminophen  (NORCO/VICODIN) 5-325 MG tablet Take 1 tablet by mouth 4 (four) times daily as needed. 09/24/16   Sherlene Shams, MD  naproxen (NAPROSYN) 500 MG tablet Take 1 tablet (500 mg total) by mouth 2 (two) times daily. 09/24/16   Sherlene Shams, MD  sucralfate (CARAFATE) 1 G tablet TAKE 1 TAB BY MOUTH AT MEALS AND IN THE EVENING AS NEEDED 09/22/15   Lori P Hvozdovic, PA-C    Family History Family History  Problem Relation Age of Onset  . Brain cancer Mother   . Diabetes Mother   . Breast cancer Sister   . Breast cancer Paternal Grandmother   . Diabetes Sister     x 2  . Colon cancer Neg Hx     Social History Social History  Substance Use Topics  . Smoking status: Never Smoker  . Smokeless tobacco: Never Used  . Alcohol use No     Allergies   Patient has no known allergies.   Review of Systems Review of Systems  All other systems reviewed and are negative.    Physical Exam Triage Vital Signs ED Triage Vitals  Enc Vitals Group     BP 09/24/16 1746 133/77     Pulse Rate 09/24/16 1746 91     Resp 09/24/16 1746 20     Temp 09/24/16 1746 98.7 F (37.1 C)     Temp Source 09/24/16 1746 Oral     SpO2 09/24/16 1746 99 %     Weight --      Height --      Pain Score 09/24/16 1745 10   Updated Vital Signs BP 133/77 (BP Location: Left Arm)   Pulse 91   Temp 98.7 F (37.1 C) (Oral)   Resp 20   SpO2 99%  Physical Exam  Constitutional: She is oriented to person, place, and time. No distress.  Alert, nicely groomed  HENT:  Head: Atraumatic.  Eyes:  Conjugate gaze, no eye redness/drainage  Neck: Neck supple.  Cardiovascular: Normal rate.   Pulmonary/Chest: No respiratory distress.  Lungs clear, symmetric breath sounds  Abdominal: She exhibits no distension.  Musculoskeletal: Normal range of motion.  No leg swelling Able to move the wrist, but quite painful. Focal tenderness in the snuff box area, with some swelling and some bruising of the thenar eminence. 1 cm  superficial avulsion over the distal head of the fifth metacarpal, palmar aspect.  Neurological: She is alert and oriented to person, place, and time.  Skin: Skin is warm and dry.  No cyanosis  Nursing note and vitals reviewed.    UC Treatments / Results   Radiology Dg Hand Complete Right  Result Date: 09/24/2016 CLINICAL DATA:  Right hand injury, swelling. EXAM: RIGHT HAND - COMPLETE 3+  VIEW COMPARISON:  None. FINDINGS: There is no evidence of fracture or dislocation. There is no evidence of arthropathy or other focal bone abnormality. Adjacent soft tissues are unremarkable. Bandages are seen overlying the fifth MCP. IMPRESSION: Negative. Electronically Signed   By: Franki Cabot M.D.   On: 09/24/2016 18:19    Procedures Procedures (including critical care time)  Medications Ordered in UC Medications  lidocaine-EPINEPHrine-tetracaine (LET) solution (3 mLs Topical Given 09/24/16 1908)  ketorolac (TORADOL) injection 60 mg (60 mg Intramuscular Given 09/24/16 1908)   Good pain relief from LET application.  Avulsion washed with Hibiclens and saline, antibiotic ointment applied.    Final Clinical Impressions(s) / UC Diagnoses   Final diagnoses:  Right wrist injury, initial encounter  Abrasion of palm of right hand, initial encounter   Wear splint all the time until followup with orthopedist in 7-10 days.  Ice and elevation may help with swelling/pain, 5-10 minutes 2-4 times daily. Prescription for naproxen was sent to the [facility] on [facility]; prescription for a small number of vicodin was printed.  Wash scraped area with soap/water gently 1-2 times daily and apply antibiotic ointment/bandage.  Recheck for any increasing redness/swelling/pain/drainage or new fever >100.5.    New Prescriptions New Prescriptions   HYDROCODONE-ACETAMINOPHEN (NORCO/VICODIN) 5-325 MG TABLET    Take 1 tablet by mouth 4 (four) times daily as needed.   NAPROXEN (NAPROSYN) 500 MG TABLET    Take 1 tablet  (500 mg total) by mouth 2 (two) times daily.     Sherlene Shams, MD 09/26/16 [phone]

## 2016-09-24 NOTE — ED Notes (Signed)
Provided ice pack

## 2017-04-07 ENCOUNTER — Emergency Department (HOSPITAL_COMMUNITY): Payer: Medicare HMO

## 2017-04-07 ENCOUNTER — Emergency Department (HOSPITAL_COMMUNITY)
Admission: EM | Admit: 2017-04-07 | Discharge: 2017-04-07 | Disposition: A | Discharge status: Home / Self Care | Payer: Medicare HMO | Attending: Emergency Medicine | Admitting: Emergency Medicine

## 2017-04-07 ENCOUNTER — Encounter (HOSPITAL_COMMUNITY): Payer: Self-pay | Admitting: *Deleted

## 2017-04-07 DIAGNOSIS — R109 Unspecified abdominal pain: Secondary | ICD-10-CM

## 2017-04-07 DIAGNOSIS — Z7982 Long term (current) use of aspirin: Secondary | ICD-10-CM | POA: Insufficient documentation

## 2017-04-07 DIAGNOSIS — R1084 Generalized abdominal pain: Secondary | ICD-10-CM | POA: Insufficient documentation

## 2017-04-07 DIAGNOSIS — R112 Nausea with vomiting, unspecified: Secondary | ICD-10-CM | POA: Diagnosis not present

## 2017-04-07 DIAGNOSIS — Z79899 Other long term (current) drug therapy: Secondary | ICD-10-CM | POA: Diagnosis not present

## 2017-04-07 DIAGNOSIS — R1110 Vomiting, unspecified: Secondary | ICD-10-CM | POA: Diagnosis present

## 2017-04-07 LAB — CBC WITH DIFFERENTIAL/PLATELET
Basophils Absolute: 0 10*3/uL (ref 0.0–0.1)
Basophils Relative: 0 %
EOS ABS: 0 10*3/uL (ref 0.0–0.7)
Eosinophils Relative: 0 %
HCT: 41 % (ref 36.0–46.0)
HEMOGLOBIN: 14.8 g/dL (ref 12.0–15.0)
LYMPHS ABS: 1.7 10*3/uL (ref 0.7–4.0)
Lymphocytes Relative: 11 %
MCH: 33.5 pg (ref 26.0–34.0)
MCHC: 36.1 g/dL — ABNORMAL HIGH (ref 30.0–36.0)
MCV: 92.8 fL (ref 78.0–100.0)
Monocytes Absolute: 0.3 10*3/uL (ref 0.1–1.0)
Monocytes Relative: 2 %
NEUTROS ABS: 13.5 10*3/uL — AB (ref 1.7–7.7)
NEUTROS PCT: 87 %
Platelets: 324 10*3/uL (ref 150–400)
RBC: 4.42 MIL/uL (ref 3.87–5.11)
RDW: 13.2 % (ref 11.5–15.5)
WBC: 15.4 10*3/uL — ABNORMAL HIGH (ref 4.0–10.5)

## 2017-04-07 LAB — URINALYSIS, ROUTINE W REFLEX MICROSCOPIC
BILIRUBIN URINE: NEGATIVE
GLUCOSE, UA: NEGATIVE mg/dL
HGB URINE DIPSTICK: NEGATIVE
Ketones, ur: 5 mg/dL — AB
Leukocytes, UA: NEGATIVE
Nitrite: NEGATIVE
PH: 7 (ref 5.0–8.0)
Protein, ur: NEGATIVE mg/dL
SPECIFIC GRAVITY, URINE: 1.011 (ref 1.005–1.030)

## 2017-04-07 LAB — COMPREHENSIVE METABOLIC PANEL
ALBUMIN: 4.5 g/dL (ref 3.5–5.0)
ALK PHOS: 98 U/L (ref 38–126)
ALT: 24 U/L (ref 14–54)
AST: 41 U/L (ref 15–41)
Anion gap: 11 (ref 5–15)
BUN: 19 mg/dL (ref 6–20)
CALCIUM: 8.8 mg/dL — AB (ref 8.9–10.3)
CO2: 21 mmol/L — AB (ref 22–32)
CREATININE: 0.66 mg/dL (ref 0.44–1.00)
Chloride: 109 mmol/L (ref 101–111)
GFR calc Af Amer: >60 mL/min (ref >60)
GFR calc non Af Amer: >60 mL/min (ref >60)
GLUCOSE: 132 mg/dL — AB (ref 65–99)
Potassium: 3.6 mmol/L (ref 3.5–5.1)
SODIUM: 141 mmol/L (ref 135–145)
Total Bilirubin: 1.1 mg/dL (ref 0.3–1.2)
Total Protein: 7.8 g/dL (ref 6.5–8.1)

## 2017-04-07 LAB — LIPASE, BLOOD: Lipase: 16 U/L (ref 11–51)

## 2017-04-07 MED ORDER — ONDANSETRON HCL 4 MG/2ML IJ SOLN
4.0000 mg | Freq: Once | INTRAMUSCULAR | Status: AC
  Administered 2017-04-07: 4 mg via INTRAVENOUS
  Filled 2017-04-07: qty 2

## 2017-04-07 MED ORDER — GI COCKTAIL ~~LOC~~
30.0000 mL | Freq: Once | ORAL | Status: AC
  Administered 2017-04-07: 30 mL via ORAL
  Filled 2017-04-07: qty 30

## 2017-04-07 MED ORDER — PROMETHAZINE HCL 25 MG PO TABS
25.0000 mg | ORAL_TABLET | Freq: Once | ORAL | Status: AC
  Administered 2017-04-07: 25 mg via ORAL
  Filled 2017-04-07: qty 1

## 2017-04-07 MED ORDER — ONDANSETRON 4 MG PO TBDP: 4.0000 mg | ORAL_TABLET | Freq: Three times a day (TID) | ORAL | 0 refills | Status: DC | PRN

## 2017-04-07 MED ORDER — SODIUM CHLORIDE 0.9 % IV BOLUS (SEPSIS)
1000.0000 mL | Freq: Once | INTRAVENOUS | Status: AC
  Administered 2017-04-07: 1000 mL via INTRAVENOUS

## 2017-04-07 MED ORDER — DICYCLOMINE HCL 20 MG PO TABS: 20.0000 mg | ORAL_TABLET | Freq: Two times a day (BID) | ORAL | 0 refills | Status: DC

## 2017-04-07 MED ORDER — LORAZEPAM 2 MG/ML IJ SOLN
1.0000 mg | Freq: Once | INTRAMUSCULAR | Status: AC
  Administered 2017-04-07: 1 mg via INTRAVENOUS
  Filled 2017-04-07: qty 1

## 2017-04-07 NOTE — ED Triage Notes (Addendum)
Pt from home via EMS with complaints of emesis, diarrhea, and abdominal pain that began at 4am this morning. Pt states symptoms began as diarrhea and progressed to diarrhea. Pt states she has some bright red blood with emesis and reports emesis every 5 minutes. Pt has hx of hiatal hernia and surgical repair. Pt states her symptoms feel similar to a complication she previously experienced from that. Pt's pain is in upper left quadrant. Pt had 4 mg odt zofran given by EMS     20 G L hand

## 2017-04-07 NOTE — ED Provider Notes (Signed)
[place] DEPT Provider Note   CSN: [id number] Arrival date & time: 04/07/17  1056     History   Chief Complaint Chief Complaint  Patient presents with  . Emesis  . Abdominal Pain    HPI [name] is a 61 y.o. female.  The history is provided by the patient and medical records.  Emesis   Associated symptoms include abdominal pain and diarrhea.  Abdominal Pain   Associated symptoms include diarrhea, nausea and vomiting.    61 year old female with history of chronic abdominal pain, bipolar disorder, cyclic vomiting, depression, diverticulosis, GERD, history of IBS, presenting to the ED with abdominal pain, nausea, vomiting, and diarrhea.  States around 4 AM she began having nausea and vomiting, followed by development of diarrhea.  States she's had some bright red streaks in her emesis but denies any gross hematemesis. States diarrhea has been loose and watery. Abdominal pain localized to upper abdomen, epigastric and left upper quadrant. She is a history of a hiatal hernia repair in the past as well as Nissen fundoplication and hysterectomy. States she has been taking some Phenergan she had at home without any relief of her nausea. Patient vomiting on arrival.  Past Medical History:  Diagnosis Date  . Abdominal pain    chronic, recurrent  . Allergy   . Bipolar affective disorder ([place])   . Chronic headaches   . Colon polyps    adenomatous  . Cyclical vomiting syndrome   . Depression   . Diverticulosis   . Endometriosis   . GERD (gastroesophageal reflux disease)   . Hiatal hernia   . Hiatal hernia   . IBS (irritable bowel syndrome)   . Status post repair of paraesophageal diaphragmatic hernia   . Stroke [facility]) 12/2013    Patient Active Problem List   Diagnosis Date Noted  . Cyclical vomiting with nausea 07/17/2015  . Cyclic vomiting syndrome 04/03/2014  . CVA (cerebral infarction) 01/29/2014  . Polysubstance abuse 01/29/2014  . Intractable nausea and  vomiting 11/16/2013  . N&V (nausea and vomiting) 02/03/2013  . Bipolar affective disorder ([place])   . GERD (gastroesophageal reflux disease)   . 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 HEMATEMESIS 11/15/2006  . ENDOMETRIOSIS 11/15/2006  . DEPRESSION 09/05/2006  . MIGRAINE HEADACHE 09/05/2006  . IBS 09/05/2006  . CERVICALGIA 09/05/2006    Past Surgical History:  Procedure Laterality Date  . ABDOMINAL HYSTERECTOMY  1990  . BREAST LUMPECTOMY Bilateral   . FOOT FRACTURE SURGERY Right    x 3  . NISSEN FUNDOPLICATION  82/7078   hiatal hernia repair  . TOE SURGERY Left     OB History    No data available       Home Medications    Prior to Admission medications   Medication Sig Start Date End Date Taking? Authorizing Provider  aspirin EC 325 MG tablet Take 325 mg by mouth daily.    [provider]  cyclobenzaprine (FLEXERIL) 10 MG tablet Take 10 mg by mouth 3 (three) times daily as needed for muscle spasms. For muscle spasms in back    [provider]  diclofenac sodium (VOLTAREN) 1 % GEL Apply 1 g topically 3 (three) times daily as needed (back and elbow pain .). To back and elbow    [provider]  dicyclomine (BENTYL) 20 MG tablet Take 20 mg by mouth 3 (three) times daily as needed. For intestinal spasms    [provider]  feeding supplement (BOOST / RESOURCE BREEZE) LIQD  Take 1 Container by mouth 3 (three) times daily between meals. 07/18/15   Reyne Dumas, MD  fluticasone (FLONASE) 50 MCG/ACT nasal spray Place 2 sprays into the nose daily as needed for allergies.     [provider]  gabapentin (NEURONTIN) 300 MG capsule Take 900 mg by mouth 3 (three) times daily as needed (nerve pain).  04/23/15   [provider]  HYDROcodone-acetaminophen (NORCO/VICODIN) 5-325 MG tablet Take 1 tablet by mouth 4 (four) times daily as needed. 09/24/16   Sherlene Shams, MD  lamoTRIgine (LAMICTAL) 150 MG tablet Take 150 mg by mouth daily.      [provider]  naproxen (NAPROSYN) 500 MG tablet Take 1 tablet (500 mg total) by mouth 2 (two) times daily. 09/24/16   Sherlene Shams, MD  pantoprazole (PROTONIX) 40 MG tablet Take 1 tablet (40 mg total) by mouth daily. 12/14/15   Irene Shipper, MD  perphenazine (TRILAFON) 4 MG tablet Take 4 mg by mouth 3 (three) times daily.     [provider]  pramipexole (MIRAPEX) 0.125 MG tablet Take 0.125 mg by mouth at bedtime.    [provider]  promethazine (PHENERGAN) 25 MG suppository INSERT 1 SUPPOSITORY INTO RECTUM EVERY 8 HOURS AS NEEDED FOR NAUSEA OR VOMITING 12/14/15   Irene Shipper, MD  promethazine (PHENERGAN) 25 MG tablet TAKE 1 TABLET (25 MG TOTAL) BY MOUTH EVERY 6 (SIX) HOURS AS NEEDED FOR NAUSEA OR VOMITING. 12/14/15   Irene Shipper, MD  sucralfate (CARAFATE) 1 G tablet TAKE 1 TAB BY MOUTH AT MEALS AND IN THE EVENING AS NEEDED 09/22/15   Hvozdovic, Lori P, PA-C  venlafaxine XR (EFFEXOR-XR) 75 MG 24 hr capsule Take 75 mg by mouth daily.    [provider]    Family History Family History  Problem Relation Age of Onset  . Brain cancer Mother   . Diabetes Mother   . Breast cancer Sister   . Breast cancer Paternal Grandmother   . Diabetes Sister        x 2  . Colon cancer Neg Hx     Social History Social History  Substance Use Topics  . Smoking status: Never Smoker  . Smokeless tobacco: Never Used  . Alcohol use No     Allergies   Patient has no known allergies.   Review of Systems Review of Systems  Gastrointestinal: Positive for abdominal pain, diarrhea, nausea and vomiting.  All other systems reviewed and are negative.    Physical Exam Updated Vital Signs BP (!) 134/117 (BP Location: Left Arm)   Pulse 73   Temp 97.7 F (36.5 C) (Oral)   Resp 18   SpO2 98%   Physical Exam  Constitutional: She is oriented to person, place, and time. She appears well-developed and well-nourished.  Disheveled appearing, actively vomiting on  exam  HENT:  Head: Normocephalic and atraumatic.  Mouth/Throat: Oropharynx is clear and moist.  Eyes: Conjunctivae and EOM are normal. Pupils are equal, round, and reactive to light.  Neck: Normal range of motion.  Cardiovascular: Normal rate, regular rhythm and normal heart sounds.   Pulmonary/Chest: Effort normal and breath sounds normal. No respiratory distress. She has no wheezes.  Abdominal: Soft. Bowel sounds are normal. There is tenderness in the epigastric area and left upper quadrant.  Musculoskeletal: Normal range of motion.  Neurological: She is alert and oriented to person, place, and time.  Skin: Skin is warm and dry.  Psychiatric: She has a normal  mood and affect.  Nursing note and vitals reviewed.    ED Treatments / Results  Labs (all labs ordered are listed, but only abnormal results are displayed) Labs Reviewed  CBC WITH DIFFERENTIAL/PLATELET - Abnormal; Notable for the following:       Result Value   WBC 15.4 (*)    MCHC 36.1 (*)    Neutro Abs 13.5 (*)    All other components within normal limits  COMPREHENSIVE METABOLIC PANEL - Abnormal; Notable for the following:    CO2 21 (*)    Glucose, Bld 132 (*)    Calcium 8.8 (*)    All other components within normal limits  URINALYSIS, ROUTINE W REFLEX MICROSCOPIC - Abnormal; Notable for the following:    Color, Urine STRAW (*)    Ketones, ur 5 (*)    All other components within normal limits  LIPASE, BLOOD    EKG  EKG Interpretation None       Radiology Dg Abd Acute W/chest  Result Date: 04/07/2017 CLINICAL DATA:  Nausea vomiting and diarrhea. EXAM: DG ABDOMEN ACUTE W/ 1V CHEST COMPARISON:  07/17/2015 abdominal CT FINDINGS: Normal heart size and mediastinal contours. No acute infiltrate or edema. No effusion or pneumothorax. No acute osseous findings. Normal bowel gas pattern. No abnormal stool retention. No concerning mass effect or gas collection. Pelvic calcifications attributed to phleboliths based on  previous CT. IMPRESSION: Negative abdominal radiographs.  No acute cardiopulmonary disease. Electronically Signed   By: Monte Fantasia M.D.   On: 04/07/2017 11:56    Procedures Procedures (including critical care time)  Medications Ordered in ED Medications  sodium chloride 0.9 % bolus 1,000 mL (0 mLs Intravenous Stopped 04/07/17 1327)  ondansetron (ZOFRAN) injection 4 mg (4 mg Intravenous Given 04/07/17 1143)  LORazepam (ATIVAN) injection 1 mg (1 mg Intravenous Given 04/07/17 1243)  gi cocktail (Maalox,Lidocaine,Donnatal) (30 mLs Oral Given 04/07/17 1258)  ondansetron (ZOFRAN) injection 4 mg (4 mg Intravenous Given 04/07/17 1428)     Initial Impression / Assessment and Plan / ED Course  I have reviewed the triage vital signs and the nursing notes.  Pertinent labs & imaging results that were available during my care of the patient were reviewed by me and considered in my medical decision making (see chart for details).  61 year old female here with abdominal pain, nausea, vomiting, and diarrhea. Has history of IBS as well as cyclic vomiting.  She is somewhat disheveled appearing but nontoxic. She has tenderness in her epigastrium and left upper quadrant. He did have episode of emesis during exam.  Labwork as above, slight leukocytosis noted, however multiple similar results in the past based on review of records.  May be reactive from vomiting. Acute abdominal series without signs of obstruction or free air. Patient has been given IV fluids and medications here with cessation of vomiting. She's been able to tolerate ginger ale without difficulty.  No diarrhea here.  Repeat abdominal exam improved.  Feel she is stable for discharge home with continued symptomatic control.  Will have her follow-up with her PCP.  Discussed plan with patient, she acknowledged understanding and agreed with plan of care.  Return precautions given for new or worsening symptoms.  Final Clinical Impressions(s) / ED Diagnoses    Final diagnoses:  Abdominal pain, unspecified abdominal location  Non-intractable vomiting with nausea, unspecified vomiting type    New Prescriptions New Prescriptions   DICYCLOMINE (BENTYL) 20 MG TABLET    Take 1 tablet (20 mg total) by mouth 2 (two)  times daily.   ONDANSETRON (ZOFRAN ODT) 4 MG DISINTEGRATING TABLET    Take 1 tablet (4 mg total) by mouth every 8 (eight) hours as needed for nausea.     Larene Pickett, PA-C 04/07/17 1531    Lacretia Leigh, MD 04/09/17 [phone]

## 2017-04-07 NOTE — ED Notes (Signed)
Pt back from x-ray.

## 2017-04-07 NOTE — ED Notes (Signed)
Pt offered ginger ale for PO challenge, and reminded to take small sips

## 2017-04-07 NOTE — ED Notes (Signed)
Pt ambulated steadily to restroom without assistance

## 2017-04-07 NOTE — ED Notes (Signed)
Following administration of ativan, pt was resting in bed with eyes closed and equal, unlabored respirations.

## 2017-04-07 NOTE — ED Notes (Signed)
Pt is waiting on her husband to come get her

## 2017-04-07 NOTE — Discharge Instructions (Signed)
Take the prescribed medication as directed. °Follow-up with your primary care doctor. °Return to the ED for new or worsening symptoms. °

## 2017-09-01 ENCOUNTER — Other Ambulatory Visit: Payer: Self-pay | Admitting: Specialist

## 2017-09-01 DIAGNOSIS — Z1231 Encounter for screening mammogram for malignant neoplasm of breast: Secondary | ICD-10-CM

## 2017-10-04 ENCOUNTER — Other Ambulatory Visit: Payer: Self-pay | Admitting: Specialist

## 2017-10-04 ENCOUNTER — Ambulatory Visit
Admission: RE | Admit: 2017-10-04 | Discharge: 2017-10-04 | Disposition: A | Discharge status: Home / Self Care | Payer: Medicare HMO | Source: Ambulatory Visit | Attending: Specialist | Admitting: Specialist

## 2017-10-04 DIAGNOSIS — Z1231 Encounter for screening mammogram for malignant neoplasm of breast: Secondary | ICD-10-CM

## 2017-10-04 DIAGNOSIS — N63 Unspecified lump in unspecified breast: Secondary | ICD-10-CM

## 2017-10-05 ENCOUNTER — Other Ambulatory Visit: Payer: Self-pay | Admitting: Oncology

## 2018-09-06 ENCOUNTER — Other Ambulatory Visit: Payer: Self-pay

## 2018-09-06 ENCOUNTER — Encounter (HOSPITAL_COMMUNITY): Payer: Self-pay

## 2018-09-06 ENCOUNTER — Emergency Department (HOSPITAL_COMMUNITY)
Admission: EM | Admit: 2018-09-06 | Discharge: 2018-09-06 | Disposition: A | Discharge status: Home / Self Care | Payer: Medicare HMO | Attending: Emergency Medicine | Admitting: Emergency Medicine

## 2018-09-06 DIAGNOSIS — R197 Diarrhea, unspecified: Secondary | ICD-10-CM | POA: Insufficient documentation

## 2018-09-06 DIAGNOSIS — E860 Dehydration: Secondary | ICD-10-CM | POA: Diagnosis not present

## 2018-09-06 DIAGNOSIS — R112 Nausea with vomiting, unspecified: Secondary | ICD-10-CM | POA: Diagnosis not present

## 2018-09-06 DIAGNOSIS — R1110 Vomiting, unspecified: Secondary | ICD-10-CM | POA: Diagnosis present

## 2018-09-06 DIAGNOSIS — Z7982 Long term (current) use of aspirin: Secondary | ICD-10-CM | POA: Insufficient documentation

## 2018-09-06 DIAGNOSIS — Z79899 Other long term (current) drug therapy: Secondary | ICD-10-CM | POA: Diagnosis not present

## 2018-09-06 LAB — COMPREHENSIVE METABOLIC PANEL
ALBUMIN: 4.7 g/dL (ref 3.5–5.0)
ALT: 18 U/L (ref 0–44)
ANION GAP: 14 (ref 5–15)
AST: 27 U/L (ref 15–41)
Alkaline Phosphatase: 86 U/L (ref 38–126)
BILIRUBIN TOTAL: 0.6 mg/dL (ref 0.3–1.2)
BUN: 11 mg/dL (ref 8–23)
CO2: 23 mmol/L (ref 22–32)
CREATININE: 0.7 mg/dL (ref 0.44–1.00)
Calcium: 9.3 mg/dL (ref 8.9–10.3)
Chloride: 105 mmol/L (ref 98–111)
GFR calc non Af Amer: >60 mL/min (ref >60)
GLUCOSE: 143 mg/dL — AB (ref 70–99)
POTASSIUM: 3.2 mmol/L — AB (ref 3.5–5.1)
Sodium: 142 mmol/L (ref 135–145)
Total Protein: 7.7 g/dL (ref 6.5–8.1)

## 2018-09-06 LAB — CBC WITH DIFFERENTIAL/PLATELET
Abs Immature Granulocytes: 0.1 10*3/uL — ABNORMAL HIGH (ref 0.00–0.07)
BASOS ABS: 0.1 10*3/uL (ref 0.0–0.1)
Basophils Relative: 1 %
EOS PCT: 0 %
Eosinophils Absolute: 0 10*3/uL (ref 0.0–0.5)
HEMATOCRIT: 42.4 % (ref 36.0–46.0)
Hemoglobin: 14.2 g/dL (ref 12.0–15.0)
Immature Granulocytes: 1 %
Lymphocytes Relative: 11 %
Lymphs Abs: 1.9 10*3/uL (ref 0.7–4.0)
MCH: 33.1 pg (ref 26.0–34.0)
MCHC: 33.5 g/dL (ref 30.0–36.0)
MCV: 98.8 fL (ref 80.0–100.0)
Monocytes Absolute: 0.4 10*3/uL (ref 0.1–1.0)
Monocytes Relative: 3 %
NRBC: 0 % (ref 0.0–0.2)
Neutro Abs: 14.5 10*3/uL — ABNORMAL HIGH (ref 1.7–7.7)
Neutrophils Relative %: 84 %
Platelets: 290 10*3/uL (ref 150–400)
RBC: 4.29 MIL/uL (ref 3.87–5.11)
RDW: 12.9 % (ref 11.5–15.5)
WBC: 17 10*3/uL — AB (ref 4.0–10.5)

## 2018-09-06 LAB — LIPASE, BLOOD: Lipase: 17 U/L (ref 11–51)

## 2018-09-06 MED ORDER — ONDANSETRON HCL 4 MG/2ML IJ SOLN
4.0000 mg | Freq: Once | INTRAMUSCULAR | Status: AC
  Administered 2018-09-06: 4 mg via INTRAVENOUS
  Filled 2018-09-06: qty 2

## 2018-09-06 MED ORDER — SODIUM CHLORIDE 0.9 % IV SOLN: INTRAVENOUS | Status: DC

## 2018-09-06 MED ORDER — MORPHINE SULFATE (PF) 4 MG/ML IV SOLN
4.0000 mg | Freq: Once | INTRAVENOUS | Status: AC
  Administered 2018-09-06: 4 mg via INTRAVENOUS
  Filled 2018-09-06: qty 1

## 2018-09-06 MED ORDER — LIDOCAINE VISCOUS HCL 2 % MT SOLN
15.0000 mL | Freq: Once | OROMUCOSAL | Status: AC
Start: 2018-09-06 — End: 2018-09-06
  Administered 2018-09-06: 15 mL via ORAL
  Filled 2018-09-06: qty 15

## 2018-09-06 MED ORDER — METOCLOPRAMIDE HCL 5 MG/ML IJ SOLN
10.0000 mg | Freq: Once | INTRAMUSCULAR | Status: AC
  Administered 2018-09-06: 10 mg via INTRAVENOUS
  Filled 2018-09-06: qty 2

## 2018-09-06 MED ORDER — ALUM & MAG HYDROXIDE-SIMETH 200-200-20 MG/5ML PO SUSP
30.0000 mL | Freq: Once | ORAL | Status: AC
  Administered 2018-09-06: 30 mL via ORAL
  Filled 2018-09-06: qty 30

## 2018-09-06 MED ORDER — SODIUM CHLORIDE 0.9 % IV BOLUS
1000.0000 mL | Freq: Once | INTRAVENOUS | Status: AC
  Administered 2018-09-06: 1000 mL via INTRAVENOUS

## 2018-09-06 MED ORDER — ONDANSETRON HCL 4 MG/2ML IJ SOLN: 4.0000 mg | Freq: Once | INTRAMUSCULAR | Status: DC

## 2018-09-06 MED ORDER — FAMOTIDINE IN NACL 20-0.9 MG/50ML-% IV SOLN
20.0000 mg | Freq: Once | INTRAVENOUS | Status: AC
  Administered 2018-09-06: 20 mg via INTRAVENOUS
  Filled 2018-09-06: qty 50

## 2018-09-06 MED ORDER — HALOPERIDOL LACTATE 5 MG/ML IJ SOLN
5.0000 mg | Freq: Once | INTRAMUSCULAR | Status: AC
  Administered 2018-09-06: 5 mg via INTRAVENOUS
  Filled 2018-09-06: qty 1

## 2018-09-06 NOTE — ED Triage Notes (Signed)
EMS reports from home N/V/D since last night. Took Phenergan 25mg  at 0800 without relief. Repaired hernia x 3 years. Hx of IBS. 173/110 RUQ pain  BP 186/98 HR 76 RR 18 Sp02 98 RA CBG 158

## 2018-09-06 NOTE — ED Provider Notes (Addendum)
[facility] DEPT Provider Note   CSN: [id number] Arrival date & time: 09/06/18  0957     History   Chief Complaint Chief Complaint  Patient presents with  . Nausea  . Emesis  . Diarrhea  . Abdominal Pain    HPI [name] is a 62 y.o. female.  Patient is a 62 year old female with a history of IBS, cyclical vomiting, chronic abdominal pain, diverticulosis, endometriosis and prior stroke who is presenting today with sudden onset of nausea vomiting and diarrhea that started at 2 AM this morning.  She is complaining of 10 out of 10 epigastric abdominal pain associated with the symptoms.  Now she states she is vomited so much she has some blood in her emesis.  She denies any blood in her stool.  She has not had fever.  Prior abdominal surgeries include a Nissen fundoplication and abdominal hysterectomy.  No recent surgeries.  She denies alcohol use but does use marijuana and smokes cigarettes.  The history is provided by the patient.  Emesis   This is a new problem. Episode onset: started at 2am this morning. The problem occurs 5 to 10 times per day. The problem has not changed since onset.The emesis has an appearance of stomach contents (now emesis is blood tinged). There has been no fever. Associated symptoms include abdominal pain and diarrhea. Risk factors: no sick contacts or abx or bad food exposure or travel out of the country.  Diarrhea   Associated symptoms include abdominal pain and vomiting.  Abdominal Pain   Associated symptoms include diarrhea and vomiting.    Past Medical History:  Diagnosis Date  . Abdominal pain    chronic, recurrent  . Allergy   . Bipolar affective disorder ([place])   . Chronic headaches   . Colon polyps    adenomatous  . Cyclical vomiting syndrome   . Depression   . Diverticulosis   . Endometriosis   . GERD (gastroesophageal reflux disease)   . Hiatal hernia   . Hiatal hernia   . IBS (irritable bowel  syndrome)   . Status post repair of paraesophageal diaphragmatic hernia   . Stroke [facility]) 12/2013    Patient Active Problem List   Diagnosis Date Noted  . Cyclical vomiting with nausea 07/17/2015  . Cyclic vomiting syndrome 04/03/2014  . CVA (cerebral infarction) 01/29/2014  . Polysubstance abuse ([place]) 01/29/2014  . Intractable nausea and vomiting 11/16/2013  . N&V (nausea and vomiting) 02/03/2013  . Bipolar affective disorder ([place])   . GERD (gastroesophageal reflux disease)   . 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 HEMATEMESIS 11/15/2006  . ENDOMETRIOSIS 11/15/2006  . DEPRESSION 09/05/2006  . MIGRAINE HEADACHE 09/05/2006  . IBS 09/05/2006  . CERVICALGIA 09/05/2006    Past Surgical History:  Procedure Laterality Date  . ABDOMINAL HYSTERECTOMY  1990  . BREAST LUMPECTOMY Bilateral   . FOOT FRACTURE SURGERY Right    x 3  . NISSEN FUNDOPLICATION  13/2440   hiatal hernia repair  . TOE SURGERY Left      OB History   None      Home Medications    Prior to Admission medications   Medication Sig Start Date End Date Taking? Authorizing Provider  aspirin EC 325 MG tablet Take 325 mg by mouth daily.    [provider]  cyclobenzaprine (FLEXERIL) 10 MG tablet Take 10 mg by mouth 3 (three) times daily as needed for muscle spasms. For muscle spasms in back  [provider]  diclofenac sodium (VOLTAREN) 1 % GEL Apply 1 g topically 3 (three) times daily as needed (back and elbow pain .). To back and elbow    [provider]  dicyclomine (BENTYL) 20 MG tablet Take 1 tablet (20 mg total) by mouth 2 (two) times daily. 04/07/17   Larene Pickett, PA-C  feeding supplement (BOOST / RESOURCE BREEZE) LIQD Take 1 Container by mouth 3 (three) times daily between meals. 07/18/15   Reyne Dumas, MD  fluticasone (FLONASE) 50 MCG/ACT nasal spray Place 2 sprays into the nose daily as needed for allergies.     [provider]  gabapentin (NEURONTIN) 300 MG capsule  Take 900 mg by mouth 3 (three) times daily as needed (nerve pain).  04/23/15   [provider]  HYDROcodone-acetaminophen (NORCO/VICODIN) 5-325 MG tablet Take 1 tablet by mouth 4 (four) times daily as needed. 09/24/16   Wynona Luna, MD  lamoTRIgine (LAMICTAL) 150 MG tablet Take 150 mg by mouth daily.     [provider]  naproxen (NAPROSYN) 500 MG tablet Take 1 tablet (500 mg total) by mouth 2 (two) times daily. 09/24/16   Wynona Luna, MD  ondansetron (ZOFRAN ODT) 4 MG disintegrating tablet Take 1 tablet (4 mg total) by mouth every 8 (eight) hours as needed for nausea. 04/07/17   Larene Pickett, PA-C  pantoprazole (PROTONIX) 40 MG tablet Take 1 tablet (40 mg total) by mouth daily. 12/14/15   Irene Shipper, MD  perphenazine (TRILAFON) 4 MG tablet Take 4 mg by mouth 3 (three) times daily.     [provider]  pramipexole (MIRAPEX) 0.125 MG tablet Take 0.125 mg by mouth at bedtime.    [provider]  promethazine (PHENERGAN) 25 MG suppository INSERT 1 SUPPOSITORY INTO RECTUM EVERY 8 HOURS AS NEEDED FOR NAUSEA OR VOMITING 12/14/15   Irene Shipper, MD  promethazine (PHENERGAN) 25 MG tablet TAKE 1 TABLET (25 MG TOTAL) BY MOUTH EVERY 6 (SIX) HOURS AS NEEDED FOR NAUSEA OR VOMITING. 12/14/15   Irene Shipper, MD  sucralfate (CARAFATE) 1 G tablet TAKE 1 TAB BY MOUTH AT MEALS AND IN THE EVENING AS NEEDED 09/22/15   Hvozdovic, Lori P, PA-C  venlafaxine XR (EFFEXOR-XR) 75 MG 24 hr capsule Take 75 mg by mouth daily.    [provider]    Family History Family History  Problem Relation Age of Onset  . Brain cancer Mother   . Diabetes Mother   . Breast cancer Sister   . Breast cancer Paternal Grandmother   . Diabetes Sister        x 2  . Colon cancer Neg Hx     Social History Social History   Tobacco Use  . Smoking status: Never Smoker  . Smokeless tobacco: Never Used  Substance Use Topics  . Alcohol use: No    Alcohol/week: 0.0 standard  drinks  . Drug use: Yes    Types: Marijuana     Allergies   Patient has no known allergies.   Review of Systems Review of Systems  Gastrointestinal: Positive for abdominal pain, diarrhea and vomiting.  All other systems reviewed and are negative.    Physical Exam Updated Vital Signs BP (!) 173/110   Pulse 66   Temp 97.8 F (36.6 C) (Oral)   Resp 20   Ht 5\' 7"  (1.702 m)   Wt 79.4 kg   SpO2 100%   BMI 27.41 kg/m   Physical Exam  Constitutional: She is oriented to person, place, and time. She appears well-developed and well-nourished. She appears distressed.  HENT:  Head: Normocephalic and atraumatic.  Mouth/Throat: Oropharynx is clear and moist. Mucous membranes are dry.  Eyes: Pupils are equal, round, and reactive to light. Conjunctivae and EOM are normal.  Neck: Normal range of motion. Neck supple.  Cardiovascular: Normal rate, regular rhythm and intact distal pulses.  No murmur heard. Pulmonary/Chest: Effort normal and breath sounds normal. No respiratory distress. She has no wheezes. She has no rales.  Abdominal: Soft. She exhibits no distension. There is tenderness in the epigastric area. There is no rebound, no guarding and no CVA tenderness.  Musculoskeletal: Normal range of motion. She exhibits no edema or tenderness.  Neurological: She is alert and oriented to person, place, and time.  Skin: Skin is warm and dry. No rash noted. No erythema.  Psychiatric: She has a normal mood and affect. Her behavior is normal.  Nursing note and vitals reviewed.    ED Treatments / Results  Labs (all labs ordered are listed, but only abnormal results are displayed) Labs Reviewed  CBC WITH DIFFERENTIAL/PLATELET - Abnormal; Notable for the following components:      Result Value   WBC 17.0 (*)    Neutro Abs 14.5 (*)    Abs Immature Granulocytes 0.10 (*)    All other components within normal limits  COMPREHENSIVE METABOLIC PANEL - Abnormal; Notable for the following  components:   Potassium 3.2 (*)    Glucose, Bld 143 (*)    All other components within normal limits  LIPASE, BLOOD    EKG EKG Interpretation  Date/Time:  Thursday September 06 2018 14:07:22 EST Ventricular Rate:  74 PR Interval:    QRS Duration: 82 QT Interval:  373 QTC Calculation: 414 R Axis:   25 Text Interpretation:  Sinus rhythm Borderline prolonged PR interval Probable anteroseptal infarct, recent No significant change since last tracing Confirmed by Blanchie Dessert [phone]) on 09/06/2018 2:53:54 PM   Radiology No results found.  Procedures Procedures (including critical care time)  Medications Ordered in ED Medications  0.9 %  sodium chloride infusion (has no administration in time range)  morphine 4 MG/ML injection 4 mg (4 mg Intravenous Given 09/06/18 1036)  ondansetron (ZOFRAN) injection 4 mg (4 mg Intravenous Given 09/06/18 1036)  sodium chloride 0.9 % bolus 1,000 mL (1,000 mLs Intravenous New Bag/Given 09/06/18 1039)  famotidine (PEPCID) IVPB 20 mg premix (0 mg Intravenous Stopped 09/06/18 1104)     Initial Impression / Assessment and Plan / ED Course  I have reviewed the triage vital signs and the nursing notes.  Pertinent labs & imaging results that were available during my care of the patient were reviewed by me and considered in my medical decision making (see chart for details).     62 year old female with multiple medical problems presenting today with abrupt onset of nausea vomiting and diarrhea that started around 2 AM.  Patient is retching in the room and has epigastric discomfort but no other abdominal pain to suggest diverticulitis, appendicitis.  Patient's lipase and LFTs are within normal limits with low suspicion for hepatitis or pancreatitis at this time.  Leukocytosis of 17,000 which could be acute phase reaction from repetitive vomiting.  Patient given IV fluids, pain and nausea medication.  Patient also given Pepcid.  She is requesting a GI  cocktail.  12:09 PM Reevaluation patient states some initially meds did help but she started having recurrent pain and nausea.  She states that she gets nausea often but she has had episodes of this in the past.  This feels like prior episodes.  Last episode was approximately a year ago.  Will give patient Reglan and continue to monitor  2:54 PM Pt still feeling nauseated but no vomiting.  Will po challenge and pt given haldol.  EKG without acute findings or prolonged QT 3:11 PM After Haldol patient is feeling much better.  She states her nausea has resolved.  She is drinking and requesting to go home.  Final Clinical Impressions(s) / ED Diagnoses   Final diagnoses:  Nausea vomiting and diarrhea  Dehydration    ED Discharge Orders    None       Blanchie Dessert, MD 09/06/18 1454    Blanchie Dessert, MD 09/06/18 1511

## 2018-09-16 ENCOUNTER — Ambulatory Visit (HOSPITAL_COMMUNITY)
Admission: EM | Admit: 2018-09-16 | Discharge: 2018-09-16 | Disposition: A | Discharge status: Home / Self Care | Payer: Medicare HMO | Attending: Family Medicine | Admitting: Family Medicine

## 2018-09-16 ENCOUNTER — Encounter (HOSPITAL_COMMUNITY): Payer: Self-pay

## 2018-09-16 ENCOUNTER — Ambulatory Visit (INDEPENDENT_AMBULATORY_CARE_PROVIDER_SITE_OTHER): Payer: Medicare HMO

## 2018-09-16 DIAGNOSIS — I10 Essential (primary) hypertension: Secondary | ICD-10-CM

## 2018-09-16 DIAGNOSIS — W2209XA Striking against other stationary object, initial encounter: Secondary | ICD-10-CM

## 2018-09-16 DIAGNOSIS — S60222A Contusion of left hand, initial encounter: Secondary | ICD-10-CM

## 2018-09-16 DIAGNOSIS — M79642 Pain in left hand: Secondary | ICD-10-CM

## 2018-09-16 MED ORDER — ACETAMINOPHEN 325 MG PO TABS
650.0000 mg | ORAL_TABLET | Freq: Once | ORAL | Status: AC
  Administered 2018-09-16: 650 mg via ORAL

## 2018-09-16 MED ORDER — ACETAMINOPHEN 325 MG PO TABS
ORAL_TABLET | ORAL | Status: AC
  Filled 2018-09-16: qty 2

## 2018-09-16 NOTE — ED Triage Notes (Signed)
Pt states she shut her left hand in the metal gate at home . This happened this morning. Pt left hand is swollen and aching.

## 2018-09-16 NOTE — Discharge Instructions (Addendum)
NO fracture on xray Likely contusion/swelling locally Please take Tylenol [phone] mg every 4-6 hours Ice had 2-3 times a day for 15-20 min Should gradually resolve over next 1-2 weeks Wear ace wrap for compression and support

## 2018-09-16 NOTE — ED Provider Notes (Signed)
[facility]    CSN: [id number] Arrival date & time: 09/16/18  1119     History   Chief Complaint Chief Complaint  Patient presents with  . Hand Pain    HPI [name] is a 62 y.o. female history of previous CVA, substance abuse, on blood thinners presenting today for evaluation of left hand injury.  Patient struck her hand in a metal gate at home approximately 1 hour ago.  She had immediate pain and swelling prompting her visit here.  She developed a small abrasion to her hand, but bleeding has been controlled.  She has not taken anything for pain, but has taken her blood thinners this morning.  Denies numbness or tingling, able to move fingers.  HPI  Past Medical History:  Diagnosis Date  . Abdominal pain    chronic, recurrent  . Allergy   . Bipolar affective disorder ([place])   . Chronic headaches   . Colon polyps    adenomatous  . Cyclical vomiting syndrome   . Depression   . Diverticulosis   . Endometriosis   . GERD (gastroesophageal reflux disease)   . Hiatal hernia   . Hiatal hernia   . IBS (irritable bowel syndrome)   . Status post repair of paraesophageal diaphragmatic hernia   . Stroke [facility]) 12/2013    Patient Active Problem List   Diagnosis Date Noted  . Cyclical vomiting with nausea 07/17/2015  . Cyclic vomiting syndrome 04/03/2014  . CVA (cerebral infarction) 01/29/2014  . Polysubstance abuse ([place]) 01/29/2014  . Intractable nausea and vomiting 11/16/2013  . N&V (nausea and vomiting) 02/03/2013  . Bipolar affective disorder ([place])   . GERD (gastroesophageal reflux disease)   . 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 HEMATEMESIS 11/15/2006  . ENDOMETRIOSIS 11/15/2006  . DEPRESSION 09/05/2006  . MIGRAINE HEADACHE 09/05/2006  . IBS 09/05/2006  . CERVICALGIA 09/05/2006    Past Surgical History:  Procedure Laterality Date  . ABDOMINAL HYSTERECTOMY  1990  . BREAST LUMPECTOMY Bilateral   . FOOT FRACTURE SURGERY Right    x 3  . NISSEN  FUNDOPLICATION  51/8841   hiatal hernia repair  . TOE SURGERY Left     OB History   None      Home Medications    Prior to Admission medications   Medication Sig Start Date End Date Taking? Authorizing Provider  aspirin EC 325 MG tablet Take 325 mg by mouth daily.    [provider]  cyclobenzaprine (FLEXERIL) 10 MG tablet Take 10 mg by mouth 3 (three) times daily as needed for muscle spasms.     [provider]  diclofenac sodium (VOLTAREN) 1 % GEL Apply 1 g topically 3 (three) times daily as needed (back and elbow pain).     [provider]  dicyclomine (BENTYL) 20 MG tablet Take 1 tablet (20 mg total) by mouth 2 (two) times daily. Patient taking differently: Take 20 mg by mouth 2 (two) times daily as needed for spasms.  04/07/17   Larene Pickett, PA-C  dipyridamole (PERSANTINE) 75 MG tablet Take 75 mg by mouth 2 (two) times daily. 08/08/18   [provider]  ezetimibe-simvastatin (VYTORIN) 10-10 MG tablet Take 1 tablet by mouth every evening. 07/27/18   [provider]  gabapentin (NEURONTIN) 300 MG capsule Take 900 mg by mouth 3 (three) times daily as needed (nerve pain).  04/23/15   [provider]  lamoTRIgine (LAMICTAL) 150 MG tablet Take 150 mg by mouth daily.  [provider]  ondansetron (ZOFRAN ODT) 4 MG disintegrating tablet Take 1 tablet (4 mg total) by mouth every 8 (eight) hours as needed for nausea. 04/07/17   Larene Pickett, PA-C  pantoprazole (PROTONIX) 40 MG tablet Take 1 tablet (40 mg total) by mouth daily. 12/14/15   Irene Shipper, MD  perphenazine (TRILAFON) 4 MG tablet Take 4 mg by mouth 3 (three) times daily.     [provider]  pramipexole (MIRAPEX) 0.5 MG tablet Take 0.5 mg by mouth at bedtime. 08/08/18   [provider]  promethazine (PHENERGAN) 25 MG suppository INSERT 1 SUPPOSITORY INTO RECTUM EVERY 8 HOURS AS NEEDED FOR NAUSEA OR VOMITING Patient taking differently: Place 25 mg  rectally every 8 (eight) hours as needed for nausea or vomiting.  12/14/15   Irene Shipper, MD  promethazine (PHENERGAN) 25 MG tablet TAKE 1 TABLET (25 MG TOTAL) BY MOUTH EVERY 6 (SIX) HOURS AS NEEDED FOR NAUSEA OR VOMITING. Patient taking differently: Take 25 mg by mouth every 6 (six) hours as needed for nausea or vomiting. TAKE 1 TABLET (25 MG TOTAL) BY MOUTH EVERY 6 (SIX) HOURS AS NEEDED FOR NAUSEA OR VOMITING. 12/14/15   Irene Shipper, MD  venlafaxine XR (EFFEXOR-XR) 75 MG 24 hr capsule Take 75 mg by mouth daily.    [provider]    Family History Family History  Problem Relation Age of Onset  . Brain cancer Mother   . Diabetes Mother   . Breast cancer Sister   . Breast cancer Paternal Grandmother   . Diabetes Sister        x 2  . Colon cancer Neg Hx     Social History Social History   Tobacco Use  . Smoking status: Never Smoker  . Smokeless tobacco: Never Used  Substance Use Topics  . Alcohol use: No    Alcohol/week: 0.0 standard drinks  . Drug use: Yes    Types: Marijuana     Allergies   Patient has no known allergies.   Review of Systems Review of Systems  Constitutional: Negative for fatigue and fever.  Eyes: Negative for visual disturbance.  Respiratory: Negative for shortness of breath.   Cardiovascular: Negative for chest pain.  Gastrointestinal: Negative for abdominal pain, nausea and vomiting.  Musculoskeletal: Positive for arthralgias and joint swelling.  Skin: Positive for color change and wound. Negative for rash.  Neurological: Negative for dizziness, weakness, light-headedness and headaches.     Physical Exam Triage Vital Signs ED Triage Vitals  Enc Vitals Group     BP 09/16/18 1202 (!) 168/88     Pulse Rate 09/16/18 1202 86     Resp 09/16/18 1202 18     Temp 09/16/18 1202 97.9 F (36.6 C)     Temp Source 09/16/18 1202 Oral     SpO2 09/16/18 1202 100 %     Weight 09/16/18 1158 175 lb (79.4 kg)     Height --      Head  Circumference --      Peak Flow --      Pain Score 09/16/18 1159 10     Pain Loc --      Pain Edu? --      Excl. in [place]? --    No data found.  Updated Vital Signs BP (!) 168/88 (BP Location: Right Arm)   Pulse 86   Temp 97.9 F (36.6 C) (Oral)   Resp 18   Wt 175 lb (79.4 kg)  SpO2 100%   BMI 27.41 kg/m   Visual Acuity Right Eye Distance:   Left Eye Distance:   Bilateral Distance:    Right Eye Near:   Left Eye Near:    Bilateral Near:     Physical Exam  Constitutional: She is oriented to person, place, and time. She appears well-developed and well-nourished.  No acute distress  HENT:  Head: Normocephalic and atraumatic.  Nose: Nose normal.  Eyes: Conjunctivae are normal.  Neck: Neck supple.  Cardiovascular: Normal rate.  Pulmonary/Chest: Effort normal. No respiratory distress.  Abdominal: She exhibits no distension.  Musculoskeletal: Normal range of motion.  Large swelling with slight bruising overlying third through fourth distal metacarpals, small superficial abrasion overlying this area, no active bleeding Able to wiggle fingers, distal sensation intact, cap refill less than 2 seconds nonTender distal radius and ulna, radial pulse 2+  Neurological: She is alert and oriented to person, place, and time.  Skin: Skin is warm and dry.  Psychiatric: She has a normal mood and affect.  Nursing note and vitals reviewed.    UC Treatments / Results  Labs (all labs ordered are listed, but only abnormal results are displayed) Labs Reviewed - No data to display  EKG None  Radiology Dg Hand Complete Left  Result Date: 09/16/2018 CLINICAL DATA:  Closed hand in metal cage this morning with pain, initial encounter EXAM: LEFT HAND - COMPLETE 3+ VIEW COMPARISON:  08/29/2005 FINDINGS: Stable radiopaque foreign body is noted adjacent to the first distal phalanx. No acute fracture or dislocation is noted. No soft tissue abnormality is seen. IMPRESSION: No acute abnormality  noted. Electronically Signed   By: Inez Catalina M.D.   On: 09/16/2018 12:24    Procedures Procedures (including critical care time)  Medications Ordered in UC Medications  acetaminophen (TYLENOL) tablet 650 mg (650 mg Oral Given 09/16/18 1219)    Initial Impression / Assessment and Plan / UC Course  I have reviewed the triage vital signs and the nursing notes.  Pertinent labs & imaging results that were available during my care of the patient were reviewed by me and considered in my medical decision making (see chart for details).     No fracture or dislocation on x-ray.  Provided Ace wrap to provide support and compression.  Continue with Tylenol, avoid NSAIDs.  Ice and elevation.  Follow-up if symptoms not resolving as expected.Discussed strict return precautions. Patient verbalized understanding and is agreeable with plan.  Final Clinical Impressions(s) / UC Diagnoses   Final diagnoses:  Left hand pain  Contusion of left hand, initial encounter     Discharge Instructions     NO fracture on xray Likely contusion/swelling locally Please take Tylenol [phone] mg every 4-6 hours Ice had 2-3 times a day for 15-20 min Should gradually resolve over next 1-2 weeks Wear ace wrap for compression and support    ED Prescriptions    None     Controlled Substance Prescriptions [place] Controlled Substance Registry consulted? Not Applicable   [place], [place], [place] 09/16/18 1233

## 2019-02-08 ENCOUNTER — Emergency Department (HOSPITAL_COMMUNITY)
Admission: EM | Admit: 2019-02-08 | Discharge: 2019-02-08 | Disposition: A | Discharge status: Home / Self Care | Payer: Medicare HMO | Attending: Emergency Medicine | Admitting: Emergency Medicine

## 2019-02-08 ENCOUNTER — Emergency Department (HOSPITAL_COMMUNITY): Payer: Medicare HMO

## 2019-02-08 ENCOUNTER — Encounter (HOSPITAL_COMMUNITY): Payer: Self-pay | Admitting: *Deleted

## 2019-02-08 ENCOUNTER — Other Ambulatory Visit: Payer: Self-pay

## 2019-02-08 DIAGNOSIS — Z79899 Other long term (current) drug therapy: Secondary | ICD-10-CM | POA: Insufficient documentation

## 2019-02-08 DIAGNOSIS — Z7982 Long term (current) use of aspirin: Secondary | ICD-10-CM | POA: Insufficient documentation

## 2019-02-08 DIAGNOSIS — E876 Hypokalemia: Secondary | ICD-10-CM | POA: Diagnosis not present

## 2019-02-08 DIAGNOSIS — R112 Nausea with vomiting, unspecified: Secondary | ICD-10-CM | POA: Diagnosis present

## 2019-02-08 LAB — COMPREHENSIVE METABOLIC PANEL
ALT: 18 U/L (ref 0–44)
AST: 25 U/L (ref 15–41)
Albumin: 4.9 g/dL (ref 3.5–5.0)
Alkaline Phosphatase: 98 U/L (ref 38–126)
Anion gap: 15 (ref 5–15)
BUN: 14 mg/dL (ref 8–23)
CO2: 29 mmol/L (ref 22–32)
Calcium: 9.8 mg/dL (ref 8.9–10.3)
Chloride: 95 mmol/L — ABNORMAL LOW (ref 98–111)
Creatinine, Ser: 0.78 mg/dL (ref 0.44–1.00)
GFR calc Af Amer: >60 mL/min (ref >60)
GFR calc non Af Amer: >60 mL/min (ref >60)
Glucose, Bld: 132 mg/dL — ABNORMAL HIGH (ref 70–99)
Potassium: 2.5 mmol/L — CL (ref 3.5–5.1)
Sodium: 139 mmol/L (ref 135–145)
Total Bilirubin: 0.7 mg/dL (ref 0.3–1.2)
Total Protein: 8.7 g/dL — ABNORMAL HIGH (ref 6.5–8.1)

## 2019-02-08 LAB — URINALYSIS, ROUTINE W REFLEX MICROSCOPIC
Bilirubin Urine: NEGATIVE
Glucose, UA: NEGATIVE mg/dL
Ketones, ur: NEGATIVE mg/dL
Leukocytes,Ua: NEGATIVE
Nitrite: NEGATIVE
Protein, ur: >=300 mg/dL — AB
Specific Gravity, Urine: 1.02 (ref 1.005–1.030)
pH: 6 (ref 5.0–8.0)

## 2019-02-08 LAB — CBC
HCT: 51.3 % — ABNORMAL HIGH (ref 36.0–46.0)
Hemoglobin: 17.2 g/dL — ABNORMAL HIGH (ref 12.0–15.0)
MCH: 32.3 pg (ref 26.0–34.0)
MCHC: 33.5 g/dL (ref 30.0–36.0)
MCV: 96.2 fL (ref 80.0–100.0)
Platelets: 349 10*3/uL (ref 150–400)
RBC: 5.33 MIL/uL — ABNORMAL HIGH (ref 3.87–5.11)
RDW: 13.2 % (ref 11.5–15.5)
WBC: 15.2 10*3/uL — ABNORMAL HIGH (ref 4.0–10.5)
nRBC: 0 % (ref 0.0–0.2)

## 2019-02-08 LAB — MAGNESIUM: Magnesium: 1.8 mg/dL (ref 1.7–2.4)

## 2019-02-08 LAB — LIPASE, BLOOD: Lipase: 22 U/L (ref 11–51)

## 2019-02-08 MED ORDER — MAGNESIUM SULFATE 50 % IJ SOLN: 2.0000 g | Freq: Once | INTRAMUSCULAR | Status: DC

## 2019-02-08 MED ORDER — MAGNESIUM SULFATE 2 GM/50ML IV SOLN
2.0000 g | Freq: Once | INTRAVENOUS | Status: AC
  Administered 2019-02-08: 2 g via INTRAVENOUS
  Filled 2019-02-08: qty 50

## 2019-02-08 MED ORDER — POTASSIUM CHLORIDE CRYS ER 20 MEQ PO TBCR: 40.0000 meq | EXTENDED_RELEASE_TABLET | Freq: Two times a day (BID) | ORAL | 0 refills | Status: DC

## 2019-02-08 MED ORDER — POTASSIUM CHLORIDE 10 MEQ/100ML IV SOLN
10.0000 meq | Freq: Once | INTRAVENOUS | Status: AC
  Administered 2019-02-08: 10 meq via INTRAVENOUS
  Filled 2019-02-08: qty 100

## 2019-02-08 MED ORDER — DICYCLOMINE HCL 20 MG PO TABS
20.0000 mg | ORAL_TABLET | Freq: Once | ORAL | Status: AC
  Administered 2019-02-08: 20 mg via ORAL
  Filled 2019-02-08: qty 1

## 2019-02-08 MED ORDER — CAPSAICIN 0.025 % EX CREA
1.0000 "application " | TOPICAL_CREAM | Freq: Once | CUTANEOUS | Status: AC
  Administered 2019-02-08: 1 via TOPICAL
  Filled 2019-02-08: qty 60

## 2019-02-08 MED ORDER — SODIUM CHLORIDE 0.9 % IV BOLUS
1000.0000 mL | Freq: Once | INTRAVENOUS | Status: AC
  Administered 2019-02-08: 1000 mL via INTRAVENOUS

## 2019-02-08 MED ORDER — LIDOCAINE VISCOUS HCL 2 % MT SOLN
15.0000 mL | Freq: Once | OROMUCOSAL | Status: AC
  Administered 2019-02-08: 15 mL via ORAL
  Filled 2019-02-08: qty 15

## 2019-02-08 MED ORDER — SODIUM CHLORIDE 0.9% FLUSH: 3.0000 mL | Freq: Once | INTRAVENOUS | Status: DC

## 2019-02-08 MED ORDER — ALUM & MAG HYDROXIDE-SIMETH 200-200-20 MG/5ML PO SUSP
30.0000 mL | Freq: Once | ORAL | Status: AC
  Administered 2019-02-08: 30 mL via ORAL
  Filled 2019-02-08: qty 30

## 2019-02-08 MED ORDER — DIPHENHYDRAMINE HCL 50 MG/ML IJ SOLN
25.0000 mg | Freq: Once | INTRAMUSCULAR | Status: AC
  Administered 2019-02-08: 25 mg via INTRAVENOUS
  Filled 2019-02-08: qty 1

## 2019-02-08 MED ORDER — HALOPERIDOL LACTATE 5 MG/ML IJ SOLN
5.0000 mg | Freq: Once | INTRAMUSCULAR | Status: AC
  Administered 2019-02-08: 5 mg via INTRAVENOUS
  Filled 2019-02-08: qty 1

## 2019-02-08 NOTE — ED Notes (Signed)
Bed: [facility]08 Expected date:  Expected time:  Means of arrival:  Comments: 63 yo N/V

## 2019-02-08 NOTE — Discharge Instructions (Signed)
You were seen in the ED for nausea, vomiting, abdominal pain.  Abdominal x-ray did not show obstruction or other concerning signs.  Potassium was 2.5, this was repleted.  I suspect your symptoms may be related to a virus or your IBS or marijuana use or gastritis.  Continue taking pantoprazole.  Use rectal Phenergan for nausea.  Stay well-hydrated.  Avoid irritating foods and liquids like alcohol, ibuprofen containing products, aspirin.  Consider cutting back on marijuana use.  Called GI and make an appointment for further evaluation of your recurrent nausea, vomiting and abdominal pains.  Return to the ED if your symptoms worsen or persist, you are not tolerating any fluids, there is fever, blood in your vomit, black stools, chest pain or shortness of breath.

## 2019-02-08 NOTE — ED Notes (Signed)
Pt given water 

## 2019-02-08 NOTE — ED Triage Notes (Signed)
Per EMS, pt from home complains of abdominal pain, n/v since yesterday. Pt denies diarrhea, fever, cough, shortness of breath. A&O, vitals WNL, ambulatory.

## 2019-02-08 NOTE — ED Provider Notes (Signed)
[facility] DEPT Provider Note   CSN: [id number] Arrival date & time: 02/08/19  1538    History   Chief Complaint Chief Complaint  Patient presents with   Abdominal Pain   Nausea    HPI [name] is a 63 y.o. female with history of GERD, paraesophageal hernia status post repair 2015, IBS, stroke, chronic marijuana use is here for evaluation of nausea and vomiting.    Sudden onset at 3 AM on Wednesday, persistent.  In the last 24 hours she has had 4 nonbloody non-bilious emesis but also threw up throughout the night.  Emesis is brown, no hematemesis or coffee-ground emesis.  Associated with cramping, 8/10, constant epigastric abdominal pain nonradiating that is worse right before throwing up.  It is better when she sits forward.  Also noticing intermittent tingling all over her body.  Has not been able to keep anything down today.  Called EMS yesterday to come to the ED but she was encouraged to stay at home and try and manage it with her medicines but woke up this morning feeling worse.  She has been using rectal Phenergan without relief.  She had a telemedicine appointment and the doctor told her to come to the hospital.  Attributes her symptoms to "IBS flare".  Reports this year she has had 2 or 3 similar episodes and has had to come to the ED for it.  She has not urinated in 2 days.  Has been compliant with her pantoprazole for GERD.  Last marijuana use was Tuesday night before her symptoms began.  Denies EtOH use.  Denies regular use of NSAIDs.  No history of ulcers, pancreatitis.  Resolved nonbloody diarrhea on Tuesday.  Has been self isolating at home.  No recent travel.  No exposure to COVID confirmed or suspected.    No associated CP, SOB, cough, back pain, hematemesis, fevers, melena.      HPI  Past Medical History:  Diagnosis Date   Abdominal pain    chronic, recurrent   Allergy    Bipolar affective disorder (HCC)    Chronic  headaches    Colon polyps    adenomatous   Cyclical vomiting syndrome    Depression    Diverticulosis    Endometriosis    GERD (gastroesophageal reflux disease)    Hiatal hernia    Hiatal hernia    IBS (irritable bowel syndrome)    Status post repair of paraesophageal diaphragmatic hernia    Stroke ([place]) 12/2013    Patient Active Problem List   Diagnosis Date Noted   Cyclical vomiting with nausea 34/74/2595   Cyclic vomiting syndrome 04/03/2014   CVA (cerebral infarction) 01/29/2014   Polysubstance abuse ([place]) 01/29/2014   Intractable nausea and vomiting 11/16/2013   N&V (nausea and vomiting) 02/03/2013   Bipolar affective disorder ([place])    GERD (gastroesophageal reflux disease)    COCAINE ABUSE, EPISODIC 11/15/2006   HEMATEMESIS 11/15/2006   ENDOMETRIOSIS 11/15/2006   DEPRESSION 09/05/2006   MIGRAINE HEADACHE 09/05/2006   IBS 09/05/2006   CERVICALGIA 09/05/2006    Past Surgical History:  Procedure Laterality Date   ABDOMINAL HYSTERECTOMY  1990   BREAST LUMPECTOMY Bilateral    FOOT FRACTURE SURGERY Right    x 3   NISSEN FUNDOPLICATION  63/8756   hiatal hernia repair   TOE SURGERY Left      OB History   No obstetric history on file.      Home Medications    Prior  to Admission medications   Medication Sig Start Date End Date Taking? Authorizing Provider  aspirin EC 325 MG tablet Take 325 mg by mouth daily.   Yes [provider]  diclofenac sodium (VOLTAREN) 1 % GEL Apply 1 g topically 3 (three) times daily as needed (back and elbow pain).    Yes [provider]  dicyclomine (BENTYL) 20 MG tablet Take 1 tablet (20 mg total) by mouth 2 (two) times daily. Patient taking differently: Take 20 mg by mouth 2 (two) times daily as needed for spasms.  04/07/17  Yes Larene Pickett, PA-C  dipyridamole (PERSANTINE) 75 MG tablet Take 75 mg by mouth 2 (two) times daily. 08/08/18  Yes [provider]    ezetimibe-simvastatin (VYTORIN) 10-10 MG tablet Take 1 tablet by mouth every evening. 07/27/18  Yes [provider]  furosemide (LASIX) 40 MG tablet Take 40 mg by mouth daily. 12/13/18  Yes [provider]  gabapentin (NEURONTIN) 300 MG capsule Take 900 mg by mouth 3 (three) times daily as needed (nerve pain).  04/23/15  Yes [provider]  lamoTRIgine (LAMICTAL) 150 MG tablet Take 150 mg by mouth at bedtime.    Yes [provider]  LINZESS 290 MCG CAPS capsule Take 290 mcg by mouth daily. 01/07/19  Yes [provider]  ondansetron (ZOFRAN ODT) 4 MG disintegrating tablet Take 1 tablet (4 mg total) by mouth every 8 (eight) hours as needed for nausea. 04/07/17  Yes Larene Pickett, PA-C  pantoprazole (PROTONIX) 40 MG tablet Take 1 tablet (40 mg total) by mouth daily. 12/14/15  Yes Irene Shipper, MD  perphenazine (TRILAFON) 4 MG tablet Take 4 mg by mouth 3 (three) times daily.    Yes [provider]  potassium chloride SA (K-DUR,KLOR-CON) 20 MEQ tablet Take 20 mEq by mouth daily. 12/17/18  Yes [provider]  pramipexole (MIRAPEX) 0.5 MG tablet Take 0.5 mg by mouth at bedtime. 08/08/18  Yes [provider]  promethazine (PHENERGAN) 25 MG suppository INSERT 1 SUPPOSITORY INTO RECTUM EVERY 8 HOURS AS NEEDED FOR NAUSEA OR VOMITING Patient taking differently: Place 25 mg rectally every 8 (eight) hours as needed for nausea or vomiting.  12/14/15  Yes Irene Shipper, MD  promethazine (PHENERGAN) 25 MG tablet TAKE 1 TABLET (25 MG TOTAL) BY MOUTH EVERY 6 (SIX) HOURS AS NEEDED FOR NAUSEA OR VOMITING. Patient taking differently: Take 25 mg by mouth every 6 (six) hours as needed for nausea or vomiting. TAKE 1 TABLET (25 MG TOTAL) BY MOUTH EVERY 6 (SIX) HOURS AS NEEDED FOR NAUSEA OR VOMITING. 12/14/15  Yes Irene Shipper, MD  tiZANidine (ZANAFLEX) 4 MG tablet Take 4 mg by mouth 3 (three) times daily. 12/13/18  Yes [provider]  venlafaxine XR  (EFFEXOR-XR) 75 MG 24 hr capsule Take 75 mg by mouth daily.   Yes [provider]    Family History Family History  Problem Relation Age of Onset   Brain cancer Mother    Diabetes Mother    Breast cancer Sister    Breast cancer Paternal Grandmother    Diabetes Sister        x 2   Colon cancer Neg Hx     Social History Social History   Tobacco Use   Smoking status: Never Smoker   Smokeless tobacco: Never Used  Substance Use Topics   Alcohol use: No    Alcohol/week: 0.0 standard drinks   Drug use: Yes    Types:  Marijuana     Allergies   Patient has no known allergies.   Review of Systems Review of Systems  Gastrointestinal: Positive for abdominal pain, diarrhea (resolved), nausea and vomiting.  All other systems reviewed and are negative.    Physical Exam Updated Vital Signs BP 118/77    Pulse 76    Temp 98.4 F (36.9 C) (Oral)    Resp 18    SpO2 99%   Physical Exam Vitals signs and nursing note reviewed.  Constitutional:      Appearance: She is well-developed.     Comments: Non toxic in NAD  HENT:     Head: Normocephalic and atraumatic.     Nose: Nose normal.     Mouth/Throat:     Comments: Dry lips and tongue  Eyes:     Conjunctiva/sclera: Conjunctivae normal.  Neck:     Musculoskeletal: Normal range of motion.  Cardiovascular:     Rate and Rhythm: Normal rate and regular rhythm.     Comments: 1+ radial and DP pulses bilaterally  Pulmonary:     Effort: Pulmonary effort is normal.     Breath sounds: Normal breath sounds.  Abdominal:     General: Bowel sounds are normal.     Palpations: Abdomen is soft.     Tenderness: There is abdominal tenderness in the epigastric area.     Comments: Mild epigastric tenderness without guarding. Negative Murphy's and Mcburney's. No RUQ tenderness. Active BS to lower quadrants. No suprapubic or CVA tenderness.   Musculoskeletal: Normal range of motion.  Skin:    General: Skin is warm and dry.      Capillary Refill: Capillary refill takes less than 2 seconds.  Neurological:     Mental Status: She is alert.  Psychiatric:        Behavior: Behavior normal.      ED Treatments / Results  Labs (all labs ordered are listed, but only abnormal results are displayed) Labs Reviewed  COMPREHENSIVE METABOLIC PANEL - Abnormal; Notable for the following components:      Result Value   Potassium 2.5 (*)    Chloride 95 (*)    Glucose, Bld 132 (*)    Total Protein 8.7 (*)    All other components within normal limits  CBC - Abnormal; Notable for the following components:   WBC 15.2 (*)    RBC 5.33 (*)    Hemoglobin 17.2 (*)    HCT 51.3 (*)    All other components within normal limits  URINALYSIS, ROUTINE W REFLEX MICROSCOPIC - Abnormal; Notable for the following components:   APPearance HAZY (*)    Hgb urine dipstick MODERATE (*)    Protein, ur >=300 (*)    Bacteria, UA RARE (*)    All other components within normal limits  LIPASE, BLOOD  MAGNESIUM    EKG EKG Interpretation  Date/Time:  Friday February 08 2019 18:00:22 EDT Ventricular Rate:  77 PR Interval:    QRS Duration: 92 QT Interval:  473 QTC Calculation: 536 R Axis:   27 Text Interpretation:  Sinus rhythm Abnormal R-wave progression, early transition Borderline ST depression, diffuse leads Prolonged QT interval No significant change since last tracing Confirmed by Duffy Bruce [phone]) on 02/08/2019 6:04:52 PM   Radiology Dg Abdomen Acute W/chest  Result Date: 02/08/2019 CLINICAL DATA:  Abdominal pain and nausea vomiting since yesterday. EXAM: DG ABDOMEN ACUTE W/ 1V CHEST COMPARISON:  Chest x-ray dated 04/07/2017. FINDINGS: Single-view of the chest: Heart size and mediastinal  contours are within normal limits. Lungs are clear. No pleural effusion or pneumothorax seen. Supine and upright views of the abdomen: Bowel gas pattern is nonobstructive. No evidence of soft tissue mass or abnormal fluid collection. No evidence of  free intraperitoneal air. No evidence of renal or ureteral calculi. Osseous structures are unremarkable. IMPRESSION: 1. No evidence of acute cardiopulmonary abnormality. No evidence of pneumonia or pulmonary edema. 2. Nonobstructive bowel gas pattern and no evidence of acute intra-abdominal abnormality. Electronically Signed   By: Franki Cabot M.D.   On: 02/08/2019 18:29    Procedures Procedures (including critical care time)  Medications Ordered in ED Medications  sodium chloride flush (NS) 0.9 % injection 3 mL (3 mLs Intravenous Not Given 02/08/19 1707)  potassium chloride 10 mEq in 100 mL IVPB (10 mEq Intravenous New Bag/Given 02/08/19 1840)  magnesium sulfate IVPB 2 g 50 mL (2 g Intravenous New Bag/Given 02/08/19 1759)  haloperidol lactate (HALDOL) injection 5 mg (5 mg Intravenous Given 02/08/19 1644)  diphenhydrAMINE (BENADRYL) injection 25 mg (25 mg Intravenous Given 02/08/19 1644)  dicyclomine (BENTYL) tablet 20 mg (20 mg Oral Given 02/08/19 1645)  sodium chloride 0.9 % bolus 1,000 mL (1,000 mLs Intravenous New Bag/Given 02/08/19 1644)  capsaicin (ZOSTRIX) 6.962 % cream 1 application (1 application Topical Given 02/08/19 1706)  potassium chloride 10 mEq in 100 mL IVPB (10 mEq Intravenous New Bag/Given 02/08/19 1739)  alum & mag hydroxide-simeth (MAALOX/MYLANTA) 200-200-20 MG/5ML suspension 30 mL (30 mLs Oral Given 02/08/19 1759)    And  lidocaine (XYLOCAINE) 2 % viscous mouth solution 15 mL (15 mLs Oral Given 02/08/19 1803)     Initial Impression / Assessment and Plan / ED Course  I have reviewed the triage vital signs and the nursing notes.  Pertinent labs & imaging results that were available during my care of the patient were reviewed by me and considered in my medical decision making (see chart for details).  Clinical Course as of Feb 07 1846  Fri Feb 08, 2019  1630 Marijuana use nightly last use Tuesday night before symptoms began    [CG]  1727 Potassium(!!): 2.5 [CG]  1728  Hemoglobin(!): 17.2 [CG]  1728 HCT(!): 51.3 [CG]  1728 WBC(!): 15.2 [CG]  1805 Sinus rhythm Abnormal R-wave progression, early transition Borderline ST depression, diffuse leads Prolonged QT interval No significant change since last tracing Confirmed by Duffy Bruce [phone]) on 02/08/2019 6:04:52 PM  EKG 12-Lead [CG]  1805 Potassium(!!): 2.5 [CG]  1843 Slight diffuse ST depression, QT prolongation but no  U waves, prolonged PR interval, T wave flattening or inversion. Repleted   [CG]    Clinical Course User Index [CG] Kinnie Feil, PA-C      ddx includes viral gastroenteritis vs cannabinoid hyperemesis syndrome vs gastritis/GERD. Seen by GI 2017 who documented chronic nausea, vomiting likely functional and likely related to chronic cannabis.  Given benign exam, chronicity of symptoms lower suspicion for SBO, perforated ulcer, pancreatitis, diverticulitis.  Negative Murphy's and McBurney's. She has no associated CP, SOB, pulse deficits or paresthesias lower suspicion for occult ACS or dissection. Will obtain labs, UA, KUB and give antiemetics, fluid challenge, reassess.   1840: K 2.5 with subtle EKG changes, repleted.  Mg normal. Hemoconcentration noted. Leukocytosis but appears to be chronic and likely acute phase reaction.  Significant improvement in symptoms in ED.  Tolerating PO. No emesis here. Repeat abd exam w/o tenderness. No obstructive pattern or free air on KUB.  Dc with rectal phenergan. Discussed possibility that  cannabinoid use may be contributing. Recommended GI f/u as this is her second ED visit for this. Return precautions given.  Final Clinical Impressions(s) / ED Diagnoses   Final diagnoses:  Nausea and vomiting in adult  Hypokalemia    ED Discharge Orders    None       Arlean Hopping 02/08/19 Donia Ast, MD 02/08/19 [phone]

## 2019-03-08 ENCOUNTER — Telehealth: Payer: Self-pay | Admitting: *Deleted

## 2019-03-08 NOTE — Telephone Encounter (Signed)
REFERRAL SENT TO SCHEDULING AND NOTES ON FILE FROM [facility] [id number].

## 2019-10-11 ENCOUNTER — Other Ambulatory Visit: Payer: Self-pay

## 2019-10-11 ENCOUNTER — Emergency Department (HOSPITAL_COMMUNITY): Payer: Medicare HMO

## 2019-10-11 ENCOUNTER — Ambulatory Visit
Admission: EM | Admit: 2019-10-11 | Discharge: 2019-10-11 | Disposition: A | Discharge status: Home / Self Care | Payer: Medicare HMO | Source: Home / Self Care

## 2019-10-11 ENCOUNTER — Encounter (HOSPITAL_COMMUNITY): Payer: Self-pay | Admitting: Emergency Medicine

## 2019-10-11 ENCOUNTER — Emergency Department (HOSPITAL_COMMUNITY)
Admission: EM | Admit: 2019-10-11 | Discharge: 2019-10-11 | Disposition: A | Discharge status: Home / Self Care | Payer: Medicare HMO | Attending: Emergency Medicine | Admitting: Emergency Medicine

## 2019-10-11 DIAGNOSIS — Y929 Unspecified place or not applicable: Secondary | ICD-10-CM | POA: Insufficient documentation

## 2019-10-11 DIAGNOSIS — W540XXA Bitten by dog, initial encounter: Secondary | ICD-10-CM | POA: Insufficient documentation

## 2019-10-11 DIAGNOSIS — Y999 Unspecified external cause status: Secondary | ICD-10-CM | POA: Diagnosis not present

## 2019-10-11 DIAGNOSIS — Z79899 Other long term (current) drug therapy: Secondary | ICD-10-CM | POA: Insufficient documentation

## 2019-10-11 DIAGNOSIS — Z8673 Personal history of transient ischemic attack (TIA), and cerebral infarction without residual deficits: Secondary | ICD-10-CM | POA: Insufficient documentation

## 2019-10-11 DIAGNOSIS — Z7982 Long term (current) use of aspirin: Secondary | ICD-10-CM | POA: Insufficient documentation

## 2019-10-11 DIAGNOSIS — Y9389 Activity, other specified: Secondary | ICD-10-CM | POA: Diagnosis not present

## 2019-10-11 DIAGNOSIS — S51851A Open bite of right forearm, initial encounter: Secondary | ICD-10-CM | POA: Diagnosis not present

## 2019-10-11 MED ORDER — AMOXICILLIN-POT CLAVULANATE 875-125 MG PO TABS: 1.0000 | ORAL_TABLET | Freq: Two times a day (BID) | ORAL | 0 refills | Status: DC

## 2019-10-11 MED ORDER — AMOXICILLIN-POT CLAVULANATE 875-125 MG PO TABS
1.0000 | ORAL_TABLET | Freq: Once | ORAL | Status: AC
  Administered 2019-10-11: 1 via ORAL
  Filled 2019-10-11: qty 1

## 2019-10-11 NOTE — ED Notes (Signed)
Spoke to patient about dog bite.  Patient did not know dog, states it was a stray, states there is concerns for rabies.  Dog was not caught or quarantined.  This RN informed her of our inability to do the initial round of rabies vaccines, but able to assess area if she wants.  APP aware.

## 2019-10-11 NOTE — ED Notes (Signed)
States she was bitten by a dog last pm states its a neighborhood dog, states she doesn't know the status of rabies however states she isn't getting the shots.

## 2019-10-11 NOTE — ED Triage Notes (Signed)
Patient presents with dog bite to right forearm from last night. Patient states she has seen the dog with tags but unsure of vaccines. States she does not want the rabies shots but wants to be seen for possible antibiotics.

## 2019-10-11 NOTE — ED Provider Notes (Addendum)
[facility] EMERGENCY DEPARTMENT Provider Note   CSN: [id number] Arrival date & time: 10/11/19  1153    History Chief Complaint  Patient presents with  . Animal Bite    [name] is a 63 y.o. female with medical history significant for bipolar disorder, CVA who presents for evaluation of animal bite.  Patient states yesterday she was walking her dog when the "neighborhood dog" came up and bit her right forearm.  She cleaned when she went home.  Patient states the dog is well-known to the neighborhood and has a collar.  She does not know if the dog is up-to-date on his rabies vaccine.  She denies fever, chills, nausea, vomiting, chest pain, shortness of breath, paresthesias, redness, swelling, warmth, bleeding, drainage to wound.  Her last tetanus was less than 5 years ago.  Patient adamantly refuses rabies vaccine.  Denies additional aggravating or alleviating factors.  History obtained from patient and past medical records.  No interpreter is used.  HPI     Past Medical History:  Diagnosis Date  . Abdominal pain    chronic, recurrent  . Allergy   . Bipolar affective disorder ([place])   . Chronic headaches   . Colon polyps    adenomatous  . Cyclical vomiting syndrome   . Depression   . Diverticulosis   . Endometriosis   . GERD (gastroesophageal reflux disease)   . Hiatal hernia   . Hiatal hernia   . IBS (irritable bowel syndrome)   . Status post repair of paraesophageal diaphragmatic hernia   . Stroke [facility]) 12/2013    Patient Active Problem List   Diagnosis Date Noted  . Cyclical vomiting with nausea 07/17/2015  . Cyclic vomiting syndrome 04/03/2014  . CVA (cerebral infarction) 01/29/2014  . Polysubstance abuse ([place]) 01/29/2014  . Intractable nausea and vomiting 11/16/2013  . N&V (nausea and vomiting) 02/03/2013  . Bipolar affective disorder ([place])   . GERD (gastroesophageal reflux disease)   . 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  HEMATEMESIS 11/15/2006  . ENDOMETRIOSIS 11/15/2006  . DEPRESSION 09/05/2006  . MIGRAINE HEADACHE 09/05/2006  . IBS 09/05/2006  . CERVICALGIA 09/05/2006    Past Surgical History:  Procedure Laterality Date  . ABDOMINAL HYSTERECTOMY  1990  . BREAST LUMPECTOMY Bilateral   . FOOT FRACTURE SURGERY Right    x 3  . NISSEN FUNDOPLICATION  123456   hiatal hernia repair  . TOE SURGERY Left      OB History   No obstetric history on file.     Family History  Problem Relation Age of Onset  . Brain cancer Mother   . Diabetes Mother   . Breast cancer Sister   . Breast cancer Paternal Grandmother   . Diabetes Sister        x 2  . Colon cancer Neg Hx     Social History   Tobacco Use  . Smoking status: Never Smoker  . Smokeless tobacco: Never Used  Substance Use Topics  . Alcohol use: No    Alcohol/week: 0.0 standard drinks  . Drug use: Yes    Types: Marijuana    Home Medications Prior to Admission medications   Medication Sig Start Date End Date Taking? Authorizing Provider  amoxicillin-clavulanate (AUGMENTIN) 875-125 MG tablet Take 1 tablet by mouth every 12 (twelve) hours. 10/11/19   Giovanna Kemmerer A, PA-C  aspirin EC 325 MG tablet Take 325 mg by mouth daily.    [provider]  diclofenac sodium (VOLTAREN)  1 % GEL Apply 1 g topically 3 (three) times daily as needed (back and elbow pain).     [provider]  dicyclomine (BENTYL) 20 MG tablet Take 1 tablet (20 mg total) by mouth 2 (two) times daily. Patient taking differently: Take 20 mg by mouth 2 (two) times daily as needed for spasms.  04/07/17   Larene Pickett, PA-C  dipyridamole (PERSANTINE) 75 MG tablet Take 75 mg by mouth 2 (two) times daily. 08/08/18   [provider]  ezetimibe-simvastatin (VYTORIN) 10-10 MG tablet Take 1 tablet by mouth every evening. 07/27/18   [provider]  furosemide (LASIX) 40 MG tablet Take 40 mg by mouth daily. 12/13/18   [provider]   gabapentin (NEURONTIN) 300 MG capsule Take 900 mg by mouth 3 (three) times daily as needed (nerve pain).  04/23/15   [provider]  lamoTRIgine (LAMICTAL) 150 MG tablet Take 150 mg by mouth at bedtime.     [provider]  LINZESS 290 MCG CAPS capsule Take 290 mcg by mouth daily. 01/07/19   [provider]  ondansetron (ZOFRAN ODT) 4 MG disintegrating tablet Take 1 tablet (4 mg total) by mouth every 8 (eight) hours as needed for nausea. 04/07/17   Larene Pickett, PA-C  pantoprazole (PROTONIX) 40 MG tablet Take 1 tablet (40 mg total) by mouth daily. 12/14/15   Irene Shipper, MD  perphenazine (TRILAFON) 4 MG tablet Take 4 mg by mouth 3 (three) times daily.     [provider]  potassium chloride SA (K-DUR,KLOR-CON) 20 MEQ tablet Take 2 tablets (40 mEq total) by mouth 2 (two) times daily for 10 days. 02/08/19 02/18/19  Kinnie Feil, PA-C  pramipexole (MIRAPEX) 0.5 MG tablet Take 0.5 mg by mouth at bedtime. 08/08/18   [provider]  promethazine (PHENERGAN) 25 MG suppository INSERT 1 SUPPOSITORY INTO RECTUM EVERY 8 HOURS AS NEEDED FOR NAUSEA OR VOMITING Patient taking differently: Place 25 mg rectally every 8 (eight) hours as needed for nausea or vomiting.  12/14/15   Irene Shipper, MD  promethazine (PHENERGAN) 25 MG tablet TAKE 1 TABLET (25 MG TOTAL) BY MOUTH EVERY 6 (SIX) HOURS AS NEEDED FOR NAUSEA OR VOMITING. Patient taking differently: Take 25 mg by mouth every 6 (six) hours as needed for nausea or vomiting. TAKE 1 TABLET (25 MG TOTAL) BY MOUTH EVERY 6 (SIX) HOURS AS NEEDED FOR NAUSEA OR VOMITING. 12/14/15   Irene Shipper, MD  tiZANidine (ZANAFLEX) 4 MG tablet Take 4 mg by mouth 3 (three) times daily. 12/13/18   [provider]  venlafaxine XR (EFFEXOR-XR) 75 MG 24 hr capsule Take 75 mg by mouth daily.    [provider]    Allergies    Patient has no known allergies.  Review of Systems   Review of Systems  Constitutional: Negative.    HENT: Negative.   Respiratory: Negative.   Cardiovascular: Negative.   Gastrointestinal: Negative.   Genitourinary: Negative.   Musculoskeletal: Negative.   Skin: Positive for wound.  Neurological: Negative.   All other systems reviewed and are negative.   Physical Exam Updated Vital Signs BP 114/77 (BP Location: Left Arm)   Pulse 92   Temp 98.8 F (37.1 C) (Oral)   Resp 16   SpO2 98%   Physical Exam Vitals and nursing note reviewed.  Constitutional:      General: She is not in acute distress.    Appearance: She is well-developed. She is not  ill-appearing, toxic-appearing or diaphoretic.  HENT:     Head: Normocephalic and atraumatic.     Nose: Nose normal.     Mouth/Throat:     Mouth: Mucous membranes are moist.     Pharynx: Oropharynx is clear.  Eyes:     Pupils: Pupils are equal, round, and reactive to light.  Cardiovascular:     Rate and Rhythm: Normal rate.     Pulses: Normal pulses.     Heart sounds: Normal heart sounds.  Pulmonary:     Effort: Pulmonary effort is normal. No respiratory distress.     Breath sounds: Normal breath sounds.  Abdominal:     General: Bowel sounds are normal. There is no distension.  Musculoskeletal:        General: Normal range of motion.     Right forearm: Laceration present. No swelling, edema or deformity.     Cervical back: Normal range of motion.     Comments: Full range of motion to bilateral upper extremities without difficulty.  No bony tenderness.  Puncture wound and abrasion to right medial forearm.  2+ radial pulses bilaterally  Skin:    General: Skin is warm and dry.     Capillary Refill: Capillary refill takes less than 2 seconds.     Comments: 3 cm puncture wound and 2 cm linear skin tear to right midshaft forearm medial surface.  No bleeding or drainage.  No fluctuance, induration.  No edema, erythema or warmth.  Brisk capillary refill.  Neurological:     Mental Status: She is alert.     Comments: Intact sensation  to bilateral lower extremities at difficulty.  5/5 strength upper extremities with difficulty.  Ambulatory with difficulty.       ED Results / Procedures / Treatments   Labs (all labs ordered are listed, but only abnormal results are displayed) Labs Reviewed - No data to display  EKG None  Radiology DG Forearm Right  Result Date: 10/11/2019 CLINICAL DATA:  Dog bite EXAM: RIGHT FOREARM - 2 VIEW COMPARISON:  None. FINDINGS: There is no evidence of fracture or other focal bone lesions. Overlying the dorsal aspect of the mid forearm there is skin thickening with soft tissue swelling. No radiopaque foreign body is seen. There is a small 2 mm radiopaque density seen overlying the medial aspect of the proximal forearm, likely soft tissue calcification. IMPRESSION: No definite radiopaque foreign body within the soft tissues. Electronically Signed   By: Prudencio Pair M.D.   On: 10/11/2019 13:18    Procedures Procedures (including critical care time)  Medications Ordered in ED Medications  amoxicillin-clavulanate (AUGMENTIN) 875-125 MG per tablet 1 tablet (has no administration in time range)    ED Course  I have reviewed the triage vital signs and the nursing notes.  Pertinent labs & imaging results that were available during my care of the patient were reviewed by me and considered in my medical decision making (see chart for details).  63 year old female presents for evaluation of animal bite that occurred yesterday evening. Afebrile, non septic, non ill appearing. No bleeding or drainage. NO surrounding erythema, warmth, fluctuance or induration. No palpable retained foreign body.  Patient was apparently bit by the neighborhood dog.  She is unsure if he is up-to-date on his vaccines.  Nursing and I have discussed with patient for a rabies vaccine series.  Patient adamantly denies receiving a rabies injection in the emergency department.  Discussed risk versus benefit including life or limb  threatening condition.  Patient voiced understanding of risk versus benefit and continues to decline the rabies injections.  I will start her on Augmentin.  Wound does not look actively infected at this time.  Her compartments are soft.  She is neurovascularly intact and has full range of motion to her extremities.  Plain film does not show evidence of retained foreign body.  Patient to follow-up in 2 days with her PCP for wound recheck. Tetanus up to date. Given 1st dose of Augmentin in the ED. Wound copiously irrigated in the ED.  The patient has been appropriately medically screened and/or stabilized in the ED. I have low suspicion for any other emergent medical condition which would require further screening, evaluation or treatment in the ED or require inpatient management.  Patient is hemodynamically stable and in no acute distress.  Patient able to ambulate in department prior to ED.  Evaluation does not show acute pathology that would require ongoing or additional emergent interventions while in the emergency department or further inpatient treatment.  I have discussed the diagnosis with the patient and answered all questions.  Pain is been managed while in the emergency department and patient has no further complaints prior to discharge.  Patient is comfortable with plan discussed in room and is stable for discharge at this time.  I have discussed strict return precautions for returning to the emergency department.  Patient was encouraged to follow-up with PCP/specialist refer to at discharge.    MDM Rules/Calculators/A&P                       Final Clinical Impression(s) / ED Diagnoses Final diagnoses:  Dog bite, initial encounter    Rx / DC Orders ED Discharge Orders         Ordered    amoxicillin-clavulanate (AUGMENTIN) 875-125 MG tablet  Every 12 hours     10/11/19 1320           Gypsy Kellogg A, PA-C 10/11/19 1322    Nahzir Pohle A, PA-C 10/11/19 1323    Virgel Manifold, MD 10/15/19 [phone]

## 2019-10-11 NOTE — Discharge Instructions (Signed)
Take the antibiotics as prescribed. You did not want the rabies vaccine in the ED today. If you change your mind you may get the vaccine within the next 10 days. Follow up for a wound check in 2 days. If you develop high fever, increased, redness, warmth or swelling, please seek reevaluation.

## 2019-10-18 ENCOUNTER — Other Ambulatory Visit: Payer: Self-pay | Admitting: Internal Medicine

## 2019-10-18 DIAGNOSIS — Z1231 Encounter for screening mammogram for malignant neoplasm of breast: Secondary | ICD-10-CM

## 2019-10-21 ENCOUNTER — Other Ambulatory Visit: Payer: Self-pay | Admitting: Internal Medicine

## 2019-10-21 DIAGNOSIS — N6019 Diffuse cystic mastopathy of unspecified breast: Secondary | ICD-10-CM

## 2019-10-22 ENCOUNTER — Emergency Department (HOSPITAL_COMMUNITY)
Admission: EM | Admit: 2019-10-22 | Discharge: 2019-10-22 | Disposition: A | Discharge status: Home / Self Care | Payer: Medicare HMO | Attending: Emergency Medicine | Admitting: Emergency Medicine

## 2019-10-22 ENCOUNTER — Encounter (HOSPITAL_COMMUNITY): Payer: Self-pay

## 2019-10-22 DIAGNOSIS — Z8673 Personal history of transient ischemic attack (TIA), and cerebral infarction without residual deficits: Secondary | ICD-10-CM | POA: Insufficient documentation

## 2019-10-22 DIAGNOSIS — R1012 Left upper quadrant pain: Secondary | ICD-10-CM | POA: Insufficient documentation

## 2019-10-22 DIAGNOSIS — Z79899 Other long term (current) drug therapy: Secondary | ICD-10-CM | POA: Insufficient documentation

## 2019-10-22 DIAGNOSIS — R1013 Epigastric pain: Secondary | ICD-10-CM

## 2019-10-22 LAB — CBC WITH DIFFERENTIAL/PLATELET
Abs Immature Granulocytes: 0.06 10*3/uL (ref 0.00–0.07)
Basophils Absolute: 0.1 10*3/uL (ref 0.0–0.1)
Basophils Relative: 1 %
Eosinophils Absolute: 0.1 10*3/uL (ref 0.0–0.5)
Eosinophils Relative: 1 %
HCT: 39.8 % (ref 36.0–46.0)
Hemoglobin: 13.6 g/dL (ref 12.0–15.0)
Immature Granulocytes: 0 %
Lymphocytes Relative: 21 %
Lymphs Abs: 2.8 10*3/uL (ref 0.7–4.0)
MCH: 34.4 pg — ABNORMAL HIGH (ref 26.0–34.0)
MCHC: 34.2 g/dL (ref 30.0–36.0)
MCV: 100.8 fL — ABNORMAL HIGH (ref 80.0–100.0)
Monocytes Absolute: 0.6 10*3/uL (ref 0.1–1.0)
Monocytes Relative: 4 %
Neutro Abs: 9.7 10*3/uL — ABNORMAL HIGH (ref 1.7–7.7)
Neutrophils Relative %: 73 %
Platelets: 312 10*3/uL (ref 150–400)
RBC: 3.95 MIL/uL (ref 3.87–5.11)
RDW: 12.3 % (ref 11.5–15.5)
WBC: 13.4 10*3/uL — ABNORMAL HIGH (ref 4.0–10.5)
nRBC: 0 % (ref 0.0–0.2)

## 2019-10-22 LAB — COMPREHENSIVE METABOLIC PANEL
ALT: 17 U/L (ref 0–44)
AST: 21 U/L (ref 15–41)
Albumin: 4.4 g/dL (ref 3.5–5.0)
Alkaline Phosphatase: 87 U/L (ref 38–126)
Anion gap: 9 (ref 5–15)
BUN: 12 mg/dL (ref 8–23)
CO2: 26 mmol/L (ref 22–32)
Calcium: 9 mg/dL (ref 8.9–10.3)
Chloride: 106 mmol/L (ref 98–111)
Creatinine, Ser: 0.54 mg/dL (ref 0.44–1.00)
GFR calc Af Amer: >60 mL/min (ref >60)
GFR calc non Af Amer: >60 mL/min (ref >60)
Glucose, Bld: 157 mg/dL — ABNORMAL HIGH (ref 70–99)
Potassium: 3.3 mmol/L — ABNORMAL LOW (ref 3.5–5.1)
Sodium: 141 mmol/L (ref 135–145)
Total Bilirubin: 0.6 mg/dL (ref 0.3–1.2)
Total Protein: 7.1 g/dL (ref 6.5–8.1)

## 2019-10-22 LAB — LIPASE, BLOOD: Lipase: 17 U/L (ref 11–51)

## 2019-10-22 MED ORDER — PANTOPRAZOLE SODIUM 40 MG IV SOLR
40.0000 mg | Freq: Once | INTRAVENOUS | Status: AC
  Administered 2019-10-22: 10:00:00 40 mg via INTRAVENOUS
  Filled 2019-10-22: qty 40

## 2019-10-22 MED ORDER — LORAZEPAM 2 MG/ML IJ SOLN
0.5000 mg | Freq: Once | INTRAMUSCULAR | Status: AC
  Administered 2019-10-22: 08:00:00 0.5 mg via INTRAVENOUS
  Filled 2019-10-22: qty 1

## 2019-10-22 MED ORDER — DICYCLOMINE HCL 20 MG PO TABS: 20.0000 mg | ORAL_TABLET | Freq: Two times a day (BID) | ORAL | 0 refills | Status: DC

## 2019-10-22 MED ORDER — ALUM & MAG HYDROXIDE-SIMETH 200-200-20 MG/5ML PO SUSP
30.0000 mL | Freq: Once | ORAL | Status: AC
  Administered 2019-10-22: 30 mL via ORAL
  Filled 2019-10-22: qty 30

## 2019-10-22 MED ORDER — LIDOCAINE VISCOUS HCL 2 % MT SOLN
15.0000 mL | Freq: Once | OROMUCOSAL | Status: AC
  Administered 2019-10-22: 15 mL via ORAL
  Filled 2019-10-22: qty 15

## 2019-10-22 MED ORDER — SODIUM CHLORIDE 0.9 % IV BOLUS
1000.0000 mL | Freq: Once | INTRAVENOUS | Status: AC
  Administered 2019-10-22: 08:00:00 1000 mL via INTRAVENOUS

## 2019-10-22 MED ORDER — PROMETHAZINE HCL 25 MG/ML IJ SOLN
12.5000 mg | Freq: Once | INTRAMUSCULAR | Status: AC
  Administered 2019-10-22: 08:00:00 12.5 mg via INTRAVENOUS
  Filled 2019-10-22: qty 1

## 2019-10-22 MED ORDER — SODIUM CHLORIDE 0.9 % IV SOLN: INTRAVENOUS | Status: DC

## 2019-10-22 MED ORDER — ONDANSETRON HCL 4 MG/2ML IJ SOLN
4.0000 mg | Freq: Once | INTRAMUSCULAR | Status: AC
  Administered 2019-10-22: 11:00:00 4 mg via INTRAVENOUS
  Filled 2019-10-22: qty 2

## 2019-10-22 MED ORDER — MORPHINE SULFATE (PF) 4 MG/ML IV SOLN
6.0000 mg | Freq: Once | INTRAVENOUS | Status: AC
  Administered 2019-10-22: 6 mg via INTRAVENOUS
  Filled 2019-10-22: qty 2

## 2019-10-22 NOTE — ED Provider Notes (Signed)
[facility] DEPT Provider Note   CSN: [id number] Arrival date & time: 10/22/19  [id number]     History No chief complaint on file.   [name] is a 63 y.o. female.  63 year old female with history of IBS and cyclical vomiting syndrome presents with acute onset of left upper quadrant abdominal pain with nonbilious emesis times several hours.  No fever or chills.  No bloody stools.  Pain similar to her prior episodes of cyclical vomiting.  No treatment use prior to arrival.  Nothing makes her symptoms better or worse.        Past Medical History:  Diagnosis Date  . Abdominal pain    chronic, recurrent  . Allergy   . Bipolar affective disorder ([place])   . Chronic headaches   . Colon polyps    adenomatous  . Cyclical vomiting syndrome   . Depression   . Diverticulosis   . Endometriosis   . GERD (gastroesophageal reflux disease)   . Hiatal hernia   . Hiatal hernia   . IBS (irritable bowel syndrome)   . Status post repair of paraesophageal diaphragmatic hernia   . Stroke [facility]) 12/2013    Patient Active Problem List   Diagnosis Date Noted  . Cyclical vomiting with nausea 07/17/2015  . Cyclic vomiting syndrome 04/03/2014  . CVA (cerebral infarction) 01/29/2014  . Polysubstance abuse ([place]) 01/29/2014  . Intractable nausea and vomiting 11/16/2013  . N&V (nausea and vomiting) 02/03/2013  . Bipolar affective disorder ([place])   . GERD (gastroesophageal reflux disease)   . 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 HEMATEMESIS 11/15/2006  . ENDOMETRIOSIS 11/15/2006  . DEPRESSION 09/05/2006  . MIGRAINE HEADACHE 09/05/2006  . IBS 09/05/2006  . CERVICALGIA 09/05/2006    Past Surgical History:  Procedure Laterality Date  . ABDOMINAL HYSTERECTOMY  1990  . BREAST LUMPECTOMY Bilateral   . FOOT FRACTURE SURGERY Right    x 3  . NISSEN FUNDOPLICATION  123456   hiatal hernia repair  . TOE SURGERY Left      OB History   No obstetric history on  file.     Family History  Problem Relation Age of Onset  . Brain cancer Mother   . Diabetes Mother   . Breast cancer Sister   . Breast cancer Paternal Grandmother   . Diabetes Sister        x 2  . Colon cancer Neg Hx     Social History   Tobacco Use  . Smoking status: Never Smoker  . Smokeless tobacco: Never Used  Substance Use Topics  . Alcohol use: No    Alcohol/week: 0.0 standard drinks  . Drug use: Yes    Types: Marijuana    Home Medications Prior to Admission medications   Medication Sig Start Date End Date Taking? Authorizing Provider  amoxicillin-clavulanate (AUGMENTIN) 875-125 MG tablet Take 1 tablet by mouth every 12 (twelve) hours. 10/11/19   Henderly, Britni A, PA-C  aspirin EC 325 MG tablet Take 325 mg by mouth daily.    [provider]  diclofenac sodium (VOLTAREN) 1 % GEL Apply 1 g topically 3 (three) times daily as needed (back and elbow pain).     [provider]  dicyclomine (BENTYL) 20 MG tablet Take 1 tablet (20 mg total) by mouth 2 (two) times daily. Patient taking differently: Take 20 mg by mouth 2 (two) times daily as needed for spasms.  04/07/17   Larene Pickett, PA-C  dipyridamole (PERSANTINE) 75 MG  tablet Take 75 mg by mouth 2 (two) times daily. 08/08/18   [provider]  ezetimibe-simvastatin (VYTORIN) 10-10 MG tablet Take 1 tablet by mouth every evening. 07/27/18   [provider]  furosemide (LASIX) 40 MG tablet Take 40 mg by mouth daily. 12/13/18   [provider]  gabapentin (NEURONTIN) 300 MG capsule Take 900 mg by mouth 3 (three) times daily as needed (nerve pain).  04/23/15   [provider]  lamoTRIgine (LAMICTAL) 150 MG tablet Take 150 mg by mouth at bedtime.     [provider]  LINZESS 290 MCG CAPS capsule Take 290 mcg by mouth daily. 01/07/19   [provider]  ondansetron (ZOFRAN ODT) 4 MG disintegrating tablet Take 1 tablet (4 mg total) by mouth every 8 (eight) hours as  needed for nausea. 04/07/17   Larene Pickett, PA-C  pantoprazole (PROTONIX) 40 MG tablet Take 1 tablet (40 mg total) by mouth daily. 12/14/15   Irene Shipper, MD  perphenazine (TRILAFON) 4 MG tablet Take 4 mg by mouth 3 (three) times daily.     [provider]  potassium chloride SA (K-DUR,KLOR-CON) 20 MEQ tablet Take 2 tablets (40 mEq total) by mouth 2 (two) times daily for 10 days. 02/08/19 02/18/19  Kinnie Feil, PA-C  pramipexole (MIRAPEX) 0.5 MG tablet Take 0.5 mg by mouth at bedtime. 08/08/18   [provider]  promethazine (PHENERGAN) 25 MG suppository INSERT 1 SUPPOSITORY INTO RECTUM EVERY 8 HOURS AS NEEDED FOR NAUSEA OR VOMITING Patient taking differently: Place 25 mg rectally every 8 (eight) hours as needed for nausea or vomiting.  12/14/15   Irene Shipper, MD  promethazine (PHENERGAN) 25 MG tablet TAKE 1 TABLET (25 MG TOTAL) BY MOUTH EVERY 6 (SIX) HOURS AS NEEDED FOR NAUSEA OR VOMITING. Patient taking differently: Take 25 mg by mouth every 6 (six) hours as needed for nausea or vomiting. TAKE 1 TABLET (25 MG TOTAL) BY MOUTH EVERY 6 (SIX) HOURS AS NEEDED FOR NAUSEA OR VOMITING. 12/14/15   Irene Shipper, MD  tiZANidine (ZANAFLEX) 4 MG tablet Take 4 mg by mouth 3 (three) times daily. 12/13/18   [provider]  venlafaxine XR (EFFEXOR-XR) 75 MG 24 hr capsule Take 75 mg by mouth daily.    [provider]    Allergies    Patient has no known allergies.  Review of Systems   Review of Systems  All other systems reviewed and are negative.   Physical Exam Updated Vital Signs BP (!) 156/90 (BP Location: Left Arm)   Pulse 60   Resp 18   SpO2 97%   Physical Exam Vitals and nursing note reviewed.  Constitutional:      General: She is not in acute distress.    Appearance: Normal appearance. She is well-developed. She is not toxic-appearing.  HENT:     Head: Normocephalic and atraumatic.  Eyes:     General: Lids are normal.     Conjunctiva/sclera:  Conjunctivae normal.     Pupils: Pupils are equal, round, and reactive to light.  Neck:     Thyroid: No thyroid mass.     Trachea: No tracheal deviation.  Cardiovascular:     Rate and Rhythm: Normal rate and regular rhythm.     Heart sounds: Normal heart sounds. No murmur. No gallop.   Pulmonary:     Effort: Pulmonary effort is normal. No respiratory distress.     Breath sounds: Normal breath sounds. No stridor. No  decreased breath sounds, wheezing, rhonchi or rales.  Abdominal:     General: Bowel sounds are normal. There is no distension.     Palpations: Abdomen is soft.     Tenderness: There is abdominal tenderness in the epigastric area and left upper quadrant. There is no rebound.    Musculoskeletal:        General: No tenderness. Normal range of motion.     Cervical back: Normal range of motion and neck supple.  Skin:    General: Skin is warm and dry.     Findings: No abrasion or rash.  Neurological:     Mental Status: She is alert and oriented to person, place, and time.     GCS: GCS eye subscore is 4. GCS verbal subscore is 5. GCS motor subscore is 6.     Cranial Nerves: No cranial nerve deficit.     Sensory: No sensory deficit.  Psychiatric:        Speech: Speech normal.        Behavior: Behavior normal.     ED Results / Procedures / Treatments   Labs (all labs ordered are listed, but only abnormal results are displayed) Labs Reviewed  CBC WITH DIFFERENTIAL/PLATELET  COMPREHENSIVE METABOLIC PANEL  LIPASE, BLOOD    EKG None  Radiology No results found.  Procedures Procedures (including critical care time)  Medications Ordered in ED Medications  0.9 %  sodium chloride infusion (has no administration in time range)  LORazepam (ATIVAN) injection 0.5 mg (has no administration in time range)  promethazine (PHENERGAN) injection 12.5 mg (has no administration in time range)  morphine 4 MG/ML injection 6 mg (has no administration in time range)  sodium  chloride 0.9 % bolus 1,000 mL (1,000 mLs Intravenous Bolus 10/22/19 0746)    ED Course  I have reviewed the triage vital signs and the nursing notes.  Pertinent labs & imaging results that were available during my care of the patient were reviewed by me and considered in my medical decision making (see chart for details).    MDM Rules/Calculators/A&P                      Patient given IV fluids and treated for her likely IBS exacerbation.  Is able to take down fluids here at time of discharge.  Patient had been on Linzess but cannot afford it.  Will prescribe Bentyl. Final Clinical Impression(s) / ED Diagnoses Final diagnoses:  None    Rx / DC Orders ED Discharge Orders    None       Lacretia Leigh, MD 10/22/19 1106

## 2019-10-22 NOTE — ED Triage Notes (Signed)
Pt complains of nausea and vomiting for two hours, took a zofran at home without relief Pt states she usually receives phenergan here and obsservation

## 2019-10-23 ENCOUNTER — Other Ambulatory Visit: Payer: Self-pay | Admitting: Internal Medicine

## 2019-10-23 DIAGNOSIS — E2839 Other primary ovarian failure: Secondary | ICD-10-CM

## 2019-11-19 ENCOUNTER — Emergency Department (HOSPITAL_COMMUNITY)
Admission: EM | Admit: 2019-11-19 | Discharge: 2019-11-19 | Disposition: A | Discharge status: Home / Self Care | Payer: Medicare Other | Attending: Emergency Medicine | Admitting: Emergency Medicine

## 2019-11-19 ENCOUNTER — Emergency Department (HOSPITAL_COMMUNITY): Payer: Medicare Other

## 2019-11-19 ENCOUNTER — Encounter (HOSPITAL_COMMUNITY): Payer: Self-pay | Admitting: Emergency Medicine

## 2019-11-19 ENCOUNTER — Other Ambulatory Visit: Payer: Self-pay

## 2019-11-19 DIAGNOSIS — R1013 Epigastric pain: Secondary | ICD-10-CM | POA: Diagnosis not present

## 2019-11-19 DIAGNOSIS — R112 Nausea with vomiting, unspecified: Secondary | ICD-10-CM | POA: Insufficient documentation

## 2019-11-19 LAB — COMPREHENSIVE METABOLIC PANEL
ALT: 15 U/L (ref 0–44)
AST: 24 U/L (ref 15–41)
Albumin: 4.5 g/dL (ref 3.5–5.0)
Alkaline Phosphatase: 84 U/L (ref 38–126)
Anion gap: 13 (ref 5–15)
BUN: 16 mg/dL (ref 8–23)
CO2: 28 mmol/L (ref 22–32)
Calcium: 9.9 mg/dL (ref 8.9–10.3)
Chloride: 100 mmol/L (ref 98–111)
Creatinine, Ser: 0.8 mg/dL (ref 0.44–1.00)
GFR calc Af Amer: >60 mL/min (ref >60)
GFR calc non Af Amer: >60 mL/min (ref >60)
Glucose, Bld: 127 mg/dL — ABNORMAL HIGH (ref 70–99)
Potassium: 4.1 mmol/L (ref 3.5–5.1)
Sodium: 141 mmol/L (ref 135–145)
Total Bilirubin: 0.5 mg/dL (ref 0.3–1.2)
Total Protein: 7.7 g/dL (ref 6.5–8.1)

## 2019-11-19 LAB — CBC
HCT: 44.2 % (ref 36.0–46.0)
Hemoglobin: 15.2 g/dL — ABNORMAL HIGH (ref 12.0–15.0)
MCH: 34 pg (ref 26.0–34.0)
MCHC: 34.4 g/dL (ref 30.0–36.0)
MCV: 98.9 fL (ref 80.0–100.0)
Platelets: 366 10*3/uL (ref 150–400)
RBC: 4.47 MIL/uL (ref 3.87–5.11)
RDW: 12.5 % (ref 11.5–15.5)
WBC: 15 10*3/uL — ABNORMAL HIGH (ref 4.0–10.5)
nRBC: 0 % (ref 0.0–0.2)

## 2019-11-19 LAB — URINALYSIS, ROUTINE W REFLEX MICROSCOPIC
Bacteria, UA: NONE SEEN
Bilirubin Urine: NEGATIVE
Glucose, UA: NEGATIVE mg/dL
Hgb urine dipstick: NEGATIVE
Ketones, ur: NEGATIVE mg/dL
Nitrite: NEGATIVE
Protein, ur: 30 mg/dL — AB
Specific Gravity, Urine: 1.013 (ref 1.005–1.030)
pH: 7 (ref 5.0–8.0)

## 2019-11-19 LAB — TROPONIN I (HIGH SENSITIVITY)
Troponin I (High Sensitivity): 6 ng/L (ref <18)
Troponin I (High Sensitivity): 5 ng/L (ref <18)

## 2019-11-19 LAB — LIPASE, BLOOD: Lipase: 22 U/L (ref 11–51)

## 2019-11-19 MED ORDER — LIDOCAINE VISCOUS HCL 2 % MT SOLN
15.0000 mL | Freq: Once | OROMUCOSAL | Status: AC
  Administered 2019-11-19: 15 mL via ORAL
  Filled 2019-11-19: qty 15

## 2019-11-19 MED ORDER — ONDANSETRON HCL 4 MG/2ML IJ SOLN
4.0000 mg | Freq: Once | INTRAMUSCULAR | Status: AC
  Administered 2019-11-19: 18:00:00 4 mg via INTRAVENOUS
  Filled 2019-11-19: qty 2

## 2019-11-19 MED ORDER — HALOPERIDOL LACTATE 5 MG/ML IJ SOLN
2.0000 mg | Freq: Once | INTRAMUSCULAR | Status: AC
  Administered 2019-11-19: 17:00:00 2 mg via INTRAVENOUS
  Filled 2019-11-19: qty 1

## 2019-11-19 MED ORDER — FAMOTIDINE IN NACL 20-0.9 MG/50ML-% IV SOLN
20.0000 mg | Freq: Once | INTRAVENOUS | Status: AC
  Administered 2019-11-19: 17:00:00 20 mg via INTRAVENOUS
  Filled 2019-11-19: qty 50

## 2019-11-19 MED ORDER — SODIUM CHLORIDE 0.9% FLUSH
3.0000 mL | Freq: Once | INTRAVENOUS | Status: AC
  Administered 2019-11-19: 3 mL via INTRAVENOUS

## 2019-11-19 MED ORDER — ALUM & MAG HYDROXIDE-SIMETH 200-200-20 MG/5ML PO SUSP
30.0000 mL | Freq: Once | ORAL | Status: AC
  Administered 2019-11-19: 17:00:00 30 mL via ORAL
  Filled 2019-11-19: qty 30

## 2019-11-19 MED ORDER — SODIUM CHLORIDE 0.9 % IV BOLUS
1000.0000 mL | Freq: Once | INTRAVENOUS | Status: AC
  Administered 2019-11-19: 1000 mL via INTRAVENOUS

## 2019-11-19 NOTE — ED Triage Notes (Signed)
Pt to triage via [facility] from home.  Hx of hiatal hernia.  C/o vomiting since last night that is unrelieved with Zofran and Phenergan Rx.  EMS administerd Zofran 4mg  IV PTA.  Pt reports RUQ pain.  Also reports intermittent pressure to L chest since walking 6 miles in August.

## 2019-11-19 NOTE — ED Provider Notes (Signed)
[facility] Emergency Department Provider Note MRN:  [id number]  Arrival date & time: 11/19/19     Chief Complaint   Emesis, Chest Pain, and Abdominal Pain   History of Present Illness   [name] is a 64 y.o. year-old female with a history of bipolar disorder, hiatal hernia, cyclical vomiting syndrome presenting to the ED with chief complaint of emesis.  Onset of nausea yesterday evening, which progressed to multiple episodes of nonbloody nonbilious emesis.  Feeling a burning sensation in the center of the chest coming from the epigastrium.  Discomfort is moderate in severity, constant.  Denies diarrhea, no fever, no shortness of breath, no lower abdominal pain.  This is happened before related to her hiatal hernia.  Review of Systems  A complete 10 system review of systems was obtained and all systems are negative except as noted in the HPI and PMH.   Patient's Health History    Past Medical History:  Diagnosis Date  . Abdominal pain    chronic, recurrent  . Allergy   . Bipolar affective disorder ([place])   . Chronic headaches   . Colon polyps    adenomatous  . Cyclical vomiting syndrome   . Depression   . Diverticulosis   . Endometriosis   . GERD (gastroesophageal reflux disease)   . Hiatal hernia   . Hiatal hernia   . IBS (irritable bowel syndrome)   . Status post repair of paraesophageal diaphragmatic hernia   . Stroke [facility]) 12/2013    Past Surgical History:  Procedure Laterality Date  . ABDOMINAL HYSTERECTOMY  1990  . BREAST LUMPECTOMY Bilateral   . FOOT FRACTURE SURGERY Right    x 3  . NISSEN FUNDOPLICATION  123456   hiatal hernia repair  . TOE SURGERY Left     Family History  Problem Relation Age of Onset  . Brain cancer Mother   . Diabetes Mother   . Breast cancer Sister   . Breast cancer Paternal Grandmother   . Diabetes Sister        x 2  . Colon cancer Neg Hx     Social History   Socioeconomic History  . Marital  status: Married    Spouse name: Not on file  . Number of children: 3  . Years of education: Not on file  . Highest education level: Not on file  Occupational History  . Occupation: disabled  Tobacco Use  . Smoking status: Never Smoker  . Smokeless tobacco: Never Used  Substance and Sexual Activity  . Alcohol use: No    Alcohol/week: 0.0 standard drinks  . Drug use: Yes    Types: Marijuana  . Sexual activity: Not on file  Other Topics Concern  . Not on file  Social History Narrative  . Not on file   Social Determinants of Health   Financial Resource Strain:   . Difficulty of Paying Living Expenses: Not on file  Food Insecurity:   . Worried About Charity fundraiser in the Last Year: Not on file  . Ran Out of Food in the Last Year: Not on file  Transportation Needs:   . Lack of Transportation (Medical): Not on file  . Lack of Transportation (Non-Medical): Not on file  Physical Activity:   . Days of Exercise per Week: Not on file  . Minutes of Exercise per Session: Not on file  Stress:   . Feeling of Stress : Not on file  Social Connections:   .  Frequency of Communication with Friends and Family: Not on file  . Frequency of Social Gatherings with Friends and Family: Not on file  . Attends Religious Services: Not on file  . Active Member of Clubs or Organizations: Not on file  . Attends Archivist Meetings: Not on file  . Marital Status: Not on file  Intimate Partner Violence:   . Fear of Current or Ex-Partner: Not on file  . Emotionally Abused: Not on file  . Physically Abused: Not on file  . Sexually Abused: Not on file     Physical Exam  Vital Signs and Nursing Notes reviewed Vitals:   11/19/19 1800 11/19/19 1815  BP: 131/77 138/75  Pulse: 75 80  Resp: (!) 24 (!) 23  Temp:    SpO2: 97% 95%    CONSTITUTIONAL: Chronically ill-appearing, NAD, intermittently retching NEURO:  Alert and oriented x 3, no focal deficits EYES:  eyes equal and  reactive ENT/NECK:  no LAD, no JVD CARDIO: Regular rate, well-perfused, normal S1 and S2 PULM:  CTAB no wheezing or rhonchi GI/GU:  normal bowel sounds, non-distended, non-tender MSK/SPINE:  No gross deformities, no edema SKIN:  no rash, atraumatic PSYCH:  Appropriate speech and behavior  Diagnostic and Interventional Summary    EKG Interpretation  Date/Time:  Tuesday November 19 2019 15:57:44 EST Ventricular Rate:  91 PR Interval:  168 QRS Duration: 86 QT Interval:  386 QTC Calculation: 474 R Axis:   63 Text Interpretation: Normal sinus rhythm Right atrial enlargement Septal infarct , age undetermined Abnormal ECG No significant change was found Confirmed by Gerlene Fee [phone]) on 11/19/2019 4:30:39 PM      Labs Reviewed  COMPREHENSIVE METABOLIC PANEL - Abnormal; Notable for the following components:      Result Value   Glucose, Bld 127 (*)    All other components within normal limits  CBC - Abnormal; Notable for the following components:   WBC 15.0 (*)    Hemoglobin 15.2 (*)    All other components within normal limits  LIPASE, BLOOD  URINALYSIS, ROUTINE W REFLEX MICROSCOPIC  TROPONIN I (HIGH SENSITIVITY)  TROPONIN I (HIGH SENSITIVITY)    DG Chest Port 1 View  Final Result      Medications  sodium chloride flush (NS) 0.9 % injection 3 mL (3 mLs Intravenous Given 11/19/19 1640)  haloperidol lactate (HALDOL) injection 2 mg (2 mg Intravenous Given 11/19/19 1638)  alum & mag hydroxide-simeth (MAALOX/MYLANTA) 200-200-20 MG/5ML suspension 30 mL (30 mLs Oral Given 11/19/19 1714)    And  lidocaine (XYLOCAINE) 2 % viscous mouth solution 15 mL (15 mLs Oral Given 11/19/19 1714)  sodium chloride 0.9 % bolus 1,000 mL (1,000 mLs Intravenous New Bag/Given 11/19/19 1637)  famotidine (PEPCID) IVPB 20 mg premix (0 mg Intravenous Stopped 11/19/19 1752)  ondansetron (ZOFRAN) injection 4 mg (4 mg Intravenous Given 11/19/19 1754)     Procedures  /  Critical Care Procedures  ED Course and  Medical Decision Making  I have reviewed the triage vital signs, the nursing notes, and pertinent available records from the EMR.  Pertinent labs & imaging results that were available during my care of the patient were reviewed by me and considered in my medical decision making (see below for details).     Favoring exacerbation of patient's hiatal hernia/cyclical vomiting syndrome, abdomen is without signs of peritonitis, vitals are stable, will provide symptomatic management, follow-up labs, reassess.  7:40 PM update: Work-up is reassuring, labs unremarkable, abdomen continues to be soft  and nontender, patient's symptoms are improved, she feels better, appropriate for discharge and GI follow-up.  Barth Kirks. Sedonia Small, [place] [email or web address]  Final Clinical Impressions(s) / ED Diagnoses     ICD-10-CM   1. Non-intractable vomiting with nausea, unspecified vomiting type  R11.2   2. Epigastric pain  R10.13     ED Discharge Orders    None       Discharge Instructions Discussed with and Provided to Patient:   Discharge Instructions   None       Maudie Flakes, MD 11/19/19 1943

## 2019-11-19 NOTE — Discharge Instructions (Addendum)
You were evaluated in the Emergency Department and after careful evaluation, we did not find any emergent condition requiring admission or further testing in the hospital.  Your exam/testing today is overall reassuring.  Please return to the Emergency Department if you experience any worsening of your condition.  We encourage you to follow up with a primary care provider.  Thank you for allowing us to be a part of your care. 

## 2019-11-20 ENCOUNTER — Emergency Department (HOSPITAL_COMMUNITY)
Admission: EM | Admit: 2019-11-20 | Discharge: 2019-11-21 | Disposition: A | Discharge status: Home / Self Care | Payer: Medicare Other | Attending: Emergency Medicine | Admitting: Emergency Medicine

## 2019-11-20 ENCOUNTER — Other Ambulatory Visit: Payer: Self-pay

## 2019-11-20 ENCOUNTER — Encounter (HOSPITAL_COMMUNITY): Payer: Self-pay | Admitting: Emergency Medicine

## 2019-11-20 DIAGNOSIS — R1084 Generalized abdominal pain: Secondary | ICD-10-CM | POA: Insufficient documentation

## 2019-11-20 DIAGNOSIS — Z79899 Other long term (current) drug therapy: Secondary | ICD-10-CM | POA: Diagnosis not present

## 2019-11-20 DIAGNOSIS — Z7982 Long term (current) use of aspirin: Secondary | ICD-10-CM | POA: Diagnosis not present

## 2019-11-20 DIAGNOSIS — R1115 Cyclical vomiting syndrome unrelated to migraine: Secondary | ICD-10-CM | POA: Diagnosis not present

## 2019-11-20 DIAGNOSIS — F1210 Cannabis abuse, uncomplicated: Secondary | ICD-10-CM | POA: Insufficient documentation

## 2019-11-20 DIAGNOSIS — R109 Unspecified abdominal pain: Secondary | ICD-10-CM | POA: Diagnosis present

## 2019-11-20 LAB — COMPREHENSIVE METABOLIC PANEL
ALT: 10 U/L (ref 0–44)
AST: 43 U/L — ABNORMAL HIGH (ref 15–41)
Albumin: 4.4 g/dL (ref 3.5–5.0)
Alkaline Phosphatase: 69 U/L (ref 38–126)
Anion gap: 14 (ref 5–15)
BUN: 10 mg/dL (ref 8–23)
CO2: 25 mmol/L (ref 22–32)
Calcium: 9.5 mg/dL (ref 8.9–10.3)
Chloride: 101 mmol/L (ref 98–111)
Creatinine, Ser: 0.71 mg/dL (ref 0.44–1.00)
GFR calc Af Amer: >60 mL/min (ref >60)
GFR calc non Af Amer: >60 mL/min (ref >60)
Glucose, Bld: 137 mg/dL — ABNORMAL HIGH (ref 70–99)
Potassium: 3.9 mmol/L (ref 3.5–5.1)
Sodium: 140 mmol/L (ref 135–145)
Total Bilirubin: 0.9 mg/dL (ref 0.3–1.2)
Total Protein: 7 g/dL (ref 6.5–8.1)

## 2019-11-20 LAB — CBC
HCT: 44.7 % (ref 36.0–46.0)
Hemoglobin: 15 g/dL (ref 12.0–15.0)
MCH: 34.2 pg — ABNORMAL HIGH (ref 26.0–34.0)
MCHC: 33.6 g/dL (ref 30.0–36.0)
MCV: 102.1 fL — ABNORMAL HIGH (ref 80.0–100.0)
Platelets: 342 10*3/uL (ref 150–400)
RBC: 4.38 MIL/uL (ref 3.87–5.11)
RDW: 12.7 % (ref 11.5–15.5)
WBC: 12.6 10*3/uL — ABNORMAL HIGH (ref 4.0–10.5)
nRBC: 0 % (ref 0.0–0.2)

## 2019-11-20 LAB — LIPASE, BLOOD: Lipase: 21 U/L (ref 11–51)

## 2019-11-20 MED ORDER — SODIUM CHLORIDE 0.9% FLUSH
3.0000 mL | Freq: Once | INTRAVENOUS | Status: AC
  Administered 2019-11-21: 05:00:00 3 mL via INTRAVENOUS

## 2019-11-20 NOTE — ED Triage Notes (Signed)
Patient reports persistent generalized abdominal pain with emesis onset this week , seen here yesterday for the same complaints discharged home but did not fill her prescription. Denies fever or chills .

## 2019-11-21 LAB — URINALYSIS, ROUTINE W REFLEX MICROSCOPIC
Bacteria, UA: NONE SEEN
Bilirubin Urine: NEGATIVE
Glucose, UA: NEGATIVE mg/dL
Ketones, ur: NEGATIVE mg/dL
Leukocytes,Ua: NEGATIVE
Nitrite: NEGATIVE
Protein, ur: 100 mg/dL — AB
Specific Gravity, Urine: 1.015 (ref 1.005–1.030)
pH: 7 (ref 5.0–8.0)

## 2019-11-21 MED ORDER — ONDANSETRON 4 MG PO TBDP
4.0000 mg | ORAL_TABLET | Freq: Once | ORAL | Status: AC
  Administered 2019-11-21: 02:00:00 4 mg via ORAL
  Filled 2019-11-21: qty 1

## 2019-11-21 MED ORDER — DROPERIDOL 2.5 MG/ML IJ SOLN
2.5000 mg | Freq: Once | INTRAMUSCULAR | Status: AC
  Administered 2019-11-21: 05:00:00 2.5 mg via INTRAVENOUS
  Filled 2019-11-21: qty 2

## 2019-11-21 MED ORDER — LORAZEPAM 2 MG/ML IJ SOLN
1.0000 mg | Freq: Once | INTRAMUSCULAR | Status: AC
  Administered 2019-11-21: 05:00:00 1 mg via INTRAVENOUS
  Filled 2019-11-21: qty 1

## 2019-11-21 MED ORDER — SODIUM CHLORIDE 0.9 % IV BOLUS
500.0000 mL | Freq: Once | INTRAVENOUS | Status: AC
  Administered 2019-11-21: 05:00:00 500 mL via INTRAVENOUS

## 2019-11-21 MED ORDER — ONDANSETRON HCL 4 MG/2ML IJ SOLN
4.0000 mg | Freq: Once | INTRAMUSCULAR | Status: AC
  Administered 2019-11-21: 05:00:00 4 mg via INTRAVENOUS
  Filled 2019-11-21: qty 2

## 2019-11-21 NOTE — ED Provider Notes (Addendum)
[facility] EMERGENCY DEPARTMENT Provider Note   CSN: [id number] Arrival date & time: 11/20/19  2238     History Chief Complaint  Patient presents with  . Abdominal Pain  . Emesis    [name] is a 64 y.o. female.  Patient presents to the emergency department for evaluation of abdominal pain with vomiting.  Patient has a known history of chronic abdominal pain and cyclic vomiting syndrome.  Patient was seen in the ER yesterday with similar symptoms.  She reports that she has not been feeling any better since she left the hospital.  She has tried to use her suppositories and oral antiemetics but they have not helped.        Past Medical History:  Diagnosis Date  . Abdominal pain    chronic, recurrent  . Allergy   . Bipolar affective disorder ([place])   . Chronic headaches   . Colon polyps    adenomatous  . Cyclical vomiting syndrome   . Depression   . Diverticulosis   . Endometriosis   . GERD (gastroesophageal reflux disease)   . Hiatal hernia   . Hiatal hernia   . IBS (irritable bowel syndrome)   . Status post repair of paraesophageal diaphragmatic hernia   . Stroke [facility]) 12/2013    Patient Active Problem List   Diagnosis Date Noted  . Cyclical vomiting with nausea 07/17/2015  . Cyclic vomiting syndrome 04/03/2014  . CVA (cerebral infarction) 01/29/2014  . Polysubstance abuse ([place]) 01/29/2014  . Intractable nausea and vomiting 11/16/2013  . N&V (nausea and vomiting) 02/03/2013  . Bipolar affective disorder ([place])   . GERD (gastroesophageal reflux disease)   . 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 HEMATEMESIS 11/15/2006  . ENDOMETRIOSIS 11/15/2006  . DEPRESSION 09/05/2006  . MIGRAINE HEADACHE 09/05/2006  . IBS 09/05/2006  . CERVICALGIA 09/05/2006    Past Surgical History:  Procedure Laterality Date  . ABDOMINAL HYSTERECTOMY  1990  . BREAST LUMPECTOMY Bilateral   . FOOT FRACTURE SURGERY Right    x 3  . NISSEN FUNDOPLICATION   123456   hiatal hernia repair  . TOE SURGERY Left      OB History   No obstetric history on file.     Family History  Problem Relation Age of Onset  . Brain cancer Mother   . Diabetes Mother   . Breast cancer Sister   . Breast cancer Paternal Grandmother   . Diabetes Sister        x 2  . Colon cancer Neg Hx     Social History   Tobacco Use  . Smoking status: Never Smoker  . Smokeless tobacco: Never Used  Substance Use Topics  . Alcohol use: No    Alcohol/week: 0.0 standard drinks  . Drug use: Yes    Types: Marijuana    Home Medications Prior to Admission medications   Medication Sig Start Date End Date Taking? Authorizing Provider  aspirin EC 325 MG tablet Take 325 mg by mouth daily.   Yes [provider]  diclofenac sodium (VOLTAREN) 1 % GEL Apply 1 g topically 3 (three) times daily as needed (back and elbow pain).    Yes [provider]  dicyclomine (BENTYL) 20 MG tablet Take 1 tablet (20 mg total) by mouth 2 (two) times daily. Patient taking differently: Take 20 mg by mouth 2 (two) times daily as needed for spasms.  04/07/17  Yes Larene Pickett, PA-C  dicyclomine (BENTYL) 20 MG tablet Take  1 tablet (20 mg total) by mouth 2 (two) times daily. 10/22/19  Yes Lacretia Leigh, MD  dipyridamole (PERSANTINE) 75 MG tablet Take 75 mg by mouth 2 (two) times daily. 08/08/18  Yes [provider]  ezetimibe-simvastatin (VYTORIN) 10-10 MG tablet Take 1 tablet by mouth every evening. 07/27/18  Yes [provider]  furosemide (LASIX) 40 MG tablet Take 40 mg by mouth daily. 12/13/18  Yes [provider]  gabapentin (NEURONTIN) 300 MG capsule Take 900 mg by mouth 3 (three) times daily as needed (nerve pain).  04/23/15  Yes [provider]  lamoTRIgine (LAMICTAL) 150 MG tablet Take 150 mg by mouth at bedtime.    Yes [provider]  LINZESS 290 MCG CAPS capsule Take 290 mcg by mouth daily. 01/07/19  Yes [provider]    ondansetron (ZOFRAN ODT) 4 MG disintegrating tablet Take 1 tablet (4 mg total) by mouth every 8 (eight) hours as needed for nausea. 04/07/17  Yes Larene Pickett, PA-C  pantoprazole (PROTONIX) 40 MG tablet Take 1 tablet (40 mg total) by mouth daily. 12/14/15  Yes Irene Shipper, MD  perphenazine (TRILAFON) 4 MG tablet Take 4 mg by mouth 3 (three) times daily.    Yes [provider]  pramipexole (MIRAPEX) 0.5 MG tablet Take 0.5 mg by mouth at bedtime. 08/08/18  Yes [provider]  promethazine (PHENERGAN) 25 MG suppository INSERT 1 SUPPOSITORY INTO RECTUM EVERY 8 HOURS AS NEEDED FOR NAUSEA OR VOMITING Patient taking differently: Place 25 mg rectally every 8 (eight) hours as needed for nausea or vomiting.  12/14/15  Yes Irene Shipper, MD  promethazine (PHENERGAN) 25 MG tablet TAKE 1 TABLET (25 MG TOTAL) BY MOUTH EVERY 6 (SIX) HOURS AS NEEDED FOR NAUSEA OR VOMITING. Patient taking differently: Take 25 mg by mouth every 6 (six) hours as needed for nausea or vomiting. TAKE 1 TABLET (25 MG TOTAL) BY MOUTH EVERY 6 (SIX) HOURS AS NEEDED FOR NAUSEA OR VOMITING. 12/14/15  Yes Irene Shipper, MD  tiZANidine (ZANAFLEX) 4 MG tablet Take 4 mg by mouth 3 (three) times daily. 12/13/18  Yes [provider]  venlafaxine XR (EFFEXOR-XR) 75 MG 24 hr capsule Take 75 mg by mouth daily.   Yes [provider]  amoxicillin-clavulanate (AUGMENTIN) 875-125 MG tablet Take 1 tablet by mouth every 12 (twelve) hours. Patient not taking: Reported on 11/19/2019 10/11/19   Henderly, Britni A, PA-C  potassium chloride SA (K-DUR,KLOR-CON) 20 MEQ tablet Take 2 tablets (40 mEq total) by mouth 2 (two) times daily for 10 days. Patient not taking: Reported on 11/21/2019 02/08/19 11/20/28  Kinnie Feil, PA-C    Allergies    Patient has no known allergies.  Review of Systems   Review of Systems  Gastrointestinal: Positive for abdominal pain, nausea and vomiting.  All other systems reviewed and are  negative.   Physical Exam Updated Vital Signs BP (!) 153/98   Pulse 84   Temp 98.8 F (37.1 C)   Resp 17   SpO2 99%   Physical Exam Vitals and nursing note reviewed.  Constitutional:      General: She is not in acute distress.    Appearance: Normal appearance. She is well-developed.  HENT:     Head: Normocephalic and atraumatic.     Right Ear: Hearing normal.     Left Ear: Hearing normal.     Nose: Nose normal.  Eyes:     Conjunctiva/sclera: Conjunctivae normal.     Pupils:  Pupils are equal, round, and reactive to light.  Cardiovascular:     Rate and Rhythm: Regular rhythm.     Heart sounds: S1 normal and S2 normal. No murmur. No friction rub. No gallop.   Pulmonary:     Effort: Pulmonary effort is normal. No respiratory distress.     Breath sounds: Normal breath sounds.  Chest:     Chest wall: No tenderness.  Abdominal:     General: Bowel sounds are normal.     Palpations: Abdomen is soft.     Tenderness: There is no abdominal tenderness. There is no guarding or rebound. Negative signs include Murphy's sign and McBurney's sign.     Hernia: No hernia is present.  Musculoskeletal:        General: Normal range of motion.     Cervical back: Normal range of motion and neck supple.  Skin:    General: Skin is warm and dry.     Findings: No rash.  Neurological:     Mental Status: She is alert and oriented to person, place, and time.     GCS: GCS eye subscore is 4. GCS verbal subscore is 5. GCS motor subscore is 6.     Cranial Nerves: No cranial nerve deficit.     Sensory: No sensory deficit.     Coordination: Coordination normal.  Psychiatric:        Speech: Speech normal.        Behavior: Behavior normal.        Thought Content: Thought content normal.     ED Results / Procedures / Treatments   Labs (all labs ordered are listed, but only abnormal results are displayed) Labs Reviewed  COMPREHENSIVE METABOLIC PANEL - Abnormal; Notable for the following components:       Result Value   Glucose, Bld 137 (*)    AST 43 (*)    All other components within normal limits  CBC - Abnormal; Notable for the following components:   WBC 12.6 (*)    MCV 102.1 (*)    MCH 34.2 (*)    All other components within normal limits  URINALYSIS, ROUTINE W REFLEX MICROSCOPIC - Abnormal; Notable for the following components:   APPearance TURBID (*)    Hgb urine dipstick SMALL (*)    Protein, ur 100 (*)    All other components within normal limits  LIPASE, BLOOD    EKG EKG Interpretation  Date/Time:  Thursday November 21 2019 04:59:20 EST Ventricular Rate:  60 PR Interval:    QRS Duration: 103 QT Interval:  423 QTC Calculation: 423 R Axis:   71 Text Interpretation: Sinus rhythm Consider left atrial enlargement Probable left ventricular hypertrophy Anterior Q waves, possibly due to LVH Nonspecific T abnormalities, lateral leads No significant change since last tracing Confirmed by Orpah Greek [id number]) on 11/21/2019 6:00:35 AM   Radiology DG Chest Port 1 View  Result Date: 11/19/2019 CLINICAL DATA:  Chest pain EXAM: PORTABLE CHEST 1 VIEW COMPARISON:  July 17, 2015 FINDINGS: Lungs are clear. Heart size and pulmonary vascularity are normal. No adenopathy. No pneumothorax. No bone lesions. IMPRESSION: Lungs clear.  Heart size normal.  No adenopathy. Electronically Signed   By: Lowella Grip III M.D.   On: 11/19/2019 16:53    Procedures Procedures (including critical care time)  Medications Ordered in ED Medications  sodium chloride flush (NS) 0.9 % injection 3 mL (3 mLs Intravenous Given 11/21/19 0523)  ondansetron (ZOFRAN-ODT) disintegrating tablet 4 mg (4 mg  Oral Given 11/21/19 0201)  sodium chloride 0.9 % bolus 500 mL (500 mLs Intravenous New Bag/Given 11/21/19 0528)  ondansetron (ZOFRAN) injection 4 mg (4 mg Intravenous Given 11/21/19 0518)  LORazepam (ATIVAN) injection 1 mg (1 mg Intravenous Given 11/21/19 0518)  droperidol (INAPSINE) 2.5 MG/ML  injection 2.5 mg (2.5 mg Intravenous Given 11/21/19 0518)    ED Course  I have reviewed the triage vital signs and the nursing notes.  Pertinent labs & imaging results that were available during my care of the patient were reviewed by me and considered in my medical decision making (see chart for details).    MDM Rules/Calculators/A&P                      Patient presents to the emergency department for evaluation of nausea and vomiting.  Patient with previous history of recurrent episodes of cyclic vomiting syndrome.  She has been seen in the ER in the last 24 hours with similar.  Patient's vital signs are normal other than mild hypertension.  Abdominal exam is benign, no focal tenderness, guarding or rebound.  Lab work normal other than slight leukocytosis, no signs of focal bacterial infection by exam and history.  Current history is very similar to previous recurrent episodes of vomiting.  Patient treated with Ativan, Zofran, droperidol.  Upon recheck she is sleeping comfortably, awakens easily.  She reports that she is no longer feeling nausea.  Will discharge.  Final Clinical Impression(s) / ED Diagnoses Final diagnoses:  Generalized abdominal pain  Cyclical vomiting syndrome not associated with migraine    Rx / DC Orders ED Discharge Orders    None       Orpah Greek, MD 11/21/19 [id number]    Orpah Greek, MD 11/21/19 [phone]

## 2019-11-25 ENCOUNTER — Telehealth: Payer: Self-pay

## 2019-11-25 NOTE — Telephone Encounter (Signed)
NOTES ON FILE FROM [facility] [phone], SENT REFERRAL TO SCHEDULING 

## 2019-12-03 ENCOUNTER — Encounter: Payer: Self-pay | Admitting: Cardiovascular Disease

## 2019-12-03 ENCOUNTER — Other Ambulatory Visit: Payer: Self-pay

## 2019-12-03 ENCOUNTER — Ambulatory Visit: Payer: Medicare Other | Admitting: Cardiovascular Disease

## 2019-12-03 ENCOUNTER — Other Ambulatory Visit: Payer: Self-pay | Admitting: Cardiovascular Disease

## 2019-12-03 DIAGNOSIS — R0789 Other chest pain: Secondary | ICD-10-CM

## 2019-12-03 DIAGNOSIS — E785 Hyperlipidemia, unspecified: Secondary | ICD-10-CM | POA: Insufficient documentation

## 2019-12-03 DIAGNOSIS — R0989 Other specified symptoms and signs involving the circulatory and respiratory systems: Secondary | ICD-10-CM

## 2019-12-03 DIAGNOSIS — R0602 Shortness of breath: Secondary | ICD-10-CM | POA: Diagnosis not present

## 2019-12-03 DIAGNOSIS — R9431 Abnormal electrocardiogram [ECG] [EKG]: Secondary | ICD-10-CM

## 2019-12-03 DIAGNOSIS — E782 Mixed hyperlipidemia: Secondary | ICD-10-CM

## 2019-12-03 LAB — LIPID PANEL
Chol/HDL Ratio: 2.7 ratio (ref 0.0–4.4)
Cholesterol, Total: 170 mg/dL (ref 100–199)
HDL: 62 mg/dL (ref >39)
LDL Chol Calc (NIH): 85 mg/dL (ref 0–99)
Triglycerides: 131 mg/dL (ref 0–149)
VLDL Cholesterol Cal: 23 mg/dL (ref 5–40)

## 2019-12-03 LAB — CBC
Hematocrit: 39.5 % (ref 34.0–46.6)
Hemoglobin: 14 g/dL (ref 11.1–15.9)
MCH: 33.7 pg — ABNORMAL HIGH (ref 26.6–33.0)
MCHC: 35.4 g/dL (ref 31.5–35.7)
MCV: 95 fL (ref 79–97)
Platelets: 346 10*3/uL (ref 150–450)
RBC: 4.16 x10E6/uL (ref 3.77–5.28)
RDW: 11.8 % (ref 11.7–15.4)
WBC: 9.5 10*3/uL (ref 3.4–10.8)

## 2019-12-03 LAB — BASIC METABOLIC PANEL
BUN/Creatinine Ratio: 22 (ref 12–28)
BUN: 13 mg/dL (ref 8–27)
CO2: 22 mmol/L (ref 20–29)
Calcium: 9.6 mg/dL (ref 8.7–10.3)
Chloride: 103 mmol/L (ref 96–106)
Creatinine, Ser: 0.6 mg/dL (ref 0.57–1.00)
GFR calc Af Amer: 113 mL/min/{1.73_m2} (ref >59)
GFR calc non Af Amer: 98 mL/min/{1.73_m2} (ref >59)
Glucose: 88 mg/dL (ref 65–99)
Potassium: 4.5 mmol/L (ref 3.5–5.2)
Sodium: 140 mmol/L (ref 134–144)

## 2019-12-03 LAB — HEPATIC FUNCTION PANEL
ALT: 7 IU/L (ref 0–32)
AST: 12 IU/L (ref 0–40)
Albumin: 4.6 g/dL (ref 3.8–4.8)
Alkaline Phosphatase: 90 IU/L (ref 39–117)
Bilirubin Total: 0.2 mg/dL (ref 0.0–1.2)
Bilirubin, Direct: 0.09 mg/dL (ref 0.00–0.40)
Total Protein: 6.8 g/dL (ref 6.0–8.5)

## 2019-12-03 MED ORDER — METOPROLOL TARTRATE 100 MG PO TABS: ORAL_TABLET | ORAL | 0 refills | Status: DC

## 2019-12-03 NOTE — Patient Instructions (Addendum)
Medication Instructions:  Take 100mg  Metoprolol 2 hours prior to CTA  If you need a refill on your cardiac medications before your next appointment, please call your pharmacy.   Lab work: BMET, Lipids and Hepatic Function If you have labs (blood work) drawn today and your tests are completely normal, you will receive your results only by: MyChart Message (if you have MyChart) OR A paper copy in the mail If you have any lab test that is abnormal or we need to change your treatment, we will call you to review the results.  Testing/Procedures: Non-Cardiac CT scanning, (CAT scanning), is a noninvasive, special x-ray that produces cross-sectional images of the body using x-rays and a computer. CT scans help physicians diagnose and treat medical conditions. For some CT exams, a contrast material is used to enhance visibility in the area of the body being studied. CT scans provide greater clarity and reveal more details than regular x-ray exams.  AND  Your physician has requested that you have a carotid duplex. This test is an ultrasound of the carotid arteries in your neck. It looks at blood flow through these arteries that supply the brain with blood. Allow one hour for this exam. There are no restrictions or special instructions.  AND  Your physician has requested that you have an echocardiogram. Echocardiography is a painless test that uses sound waves to create images of your heart. It provides your doctor with information about the size and shape of your heart and how well your heart's chambers and valves are working. This procedure takes approximately one hour. There are no restrictions for this procedure. [place] [address]  Follow-Up: At [facility], you and your health needs are our priority.  As part of our continuing mission to provide you with exceptional heart care, we have created designated Provider Care Teams.  These Care Teams include your primary Cardiologist  (physician) and Advanced Practice Providers (APPs -  Physician Assistants and Nurse Practitioners) who all work together to provide you with the care you need, when you need it. You may see Dr. Gwenlyn Found or one of the following Advanced Practice Providers on your designated Care Team:    Kerin Ransom, PA-C  [place], [place]  Coletta Memos, [place]  Your physician wants you to follow-up in: 1 month after all tests  Any Other Special Instructions Will Be Listed Below (If Applicable).  Your cardiac CT will be scheduled at:   [facility] [address] [place], [place] [address] [phone]   If scheduled at [facility], please arrive at the [facility] main entrance of [facility] 30-45 minutes prior to test start time. Proceed to the [facility] Radiology Department (first floor) to check-in and test prep.  Please follow these instructions carefully (unless otherwise directed):   On the Night Before the Test: . Be sure to Drink plenty of water. . Do not consume any caffeinated/decaffeinated beverages or chocolate 12 hours prior to your test. . Do not take any antihistamines 12 hours prior to your test.  On the Day of the Test: . Drink plenty of water. Do not drink any water within one hour of the test. . Do not eat any food 4 hours prior to the test. . You may take your regular medications prior to the test.  . Take 100mg  metoprolol (Lopressor) two hours prior to test. . HOLD Furosemide/Hydrochlorothiazide morning of the test. . FEMALES- please wear underwire-free bra if available  After the Test: . Drink plenty of water. . After receiving IV contrast, you may experience a mild flushed feeling. This is normal. . On occasion, you may experience a mild rash up to 24 hours after the test. This is not dangerous. If this occurs, you can take Benadryl 25 mg and increase your fluid intake. . If you experience trouble breathing, this can be serious.  If it is severe call 911 IMMEDIATELY. If it is mild, please call our office. . If you take any of these medications: Glipizide/Metformin, Avandament, Glucavance, please do not take 48 hours after completing test unless otherwise instructed.   Once we have confirmed authorization from your insurance company, we will call you to set up a date and time for your test.   For non-scheduling related questions, please contact the cardiac imaging nurse navigator should you have any questions/concerns: Marchia Bond, RN Navigator Cardiac Imaging [facility] [phone] Office

## 2019-12-03 NOTE — Assessment & Plan Note (Signed)
History of hyperlipidemia on Vytorin.  We will check a fasting lipid liver profile today.

## 2019-12-03 NOTE — Assessment & Plan Note (Signed)
Septal Q waves on 12 electrocardiogram.  We will check a 2D echocardiogram to look for wall motion normalities.  I suspect this is just lead placement

## 2019-12-03 NOTE — Assessment & Plan Note (Signed)
Atypical chest pain since August 1 mostly with exertion.  She does have positive risk factors including family history and hyperlipidemia.  I am going to get a coronary CTA to further evaluate

## 2019-12-03 NOTE — Progress Notes (Signed)
12/03/2019 [name]   January 16, 1956  [id number]  Primary Physician Rocco Serene, MD Primary Cardiologist: Lorretta Harp MD [name], [place]  HPI:  [name] is a 64 y.o. thin appearing married Caucasian female mother of 59 weeks voice, grandmother 7 grandchildren referred by Dr. Cottie Griffith for cardiovascular relation because of atypical chest pain.  She is currently disabled because of neck and back pain, prior stroke and psychiatric issues.  Risk factors include treated hyperlipidemia and family history.  Her father had a myocardial infarction age 26.  She does not smoke tobacco but does smoke marijuana.  She is had a stroke in the past but does not have a heart attack.  Since August she is noted exertional chest pressure.   Current Meds  Medication Sig  . aspirin EC 325 MG tablet Take 325 mg by mouth daily.  . diclofenac sodium (VOLTAREN) 1 % GEL Apply 1 g topically 3 (three) times daily as needed (back and elbow pain).   Marland Kitchen dicyclomine (BENTYL) 20 MG tablet Take 1 tablet (20 mg total) by mouth 2 (two) times daily.  Marland Kitchen dipyridamole (PERSANTINE) 75 MG tablet Take 75 mg by mouth 2 (two) times daily.  Marland Kitchen ezetimibe-simvastatin (VYTORIN) 10-10 MG tablet Take 1 tablet by mouth every evening.  . furosemide (LASIX) 40 MG tablet Take 40 mg by mouth daily.  Marland Kitchen gabapentin (NEURONTIN) 300 MG capsule Take 900 mg by mouth 3 (three) times daily as needed (nerve pain).   Marland Kitchen lamoTRIgine (LAMICTAL) 150 MG tablet Take 150 mg by mouth at bedtime.   Marland Kitchen LINZESS 290 MCG CAPS capsule Take 290 mcg by mouth daily.  . ondansetron (ZOFRAN ODT) 4 MG disintegrating tablet Take 1 tablet (4 mg total) by mouth every 8 (eight) hours as needed for nausea.  . pantoprazole (PROTONIX) 40 MG tablet Take 1 tablet (40 mg total) by mouth daily.  Marland Kitchen perphenazine (TRILAFON) 4 MG tablet Take 4 mg by mouth 3 (three) times daily.   . potassium chloride SA (K-DUR,KLOR-CON) 20 MEQ tablet Take 2 tablets (40 mEq  total) by mouth 2 (two) times daily for 10 days.  . pramipexole (MIRAPEX) 0.5 MG tablet Take 0.5 mg by mouth at bedtime.  . promethazine (PHENERGAN) 25 MG suppository INSERT 1 SUPPOSITORY INTO RECTUM EVERY 8 HOURS AS NEEDED FOR NAUSEA OR VOMITING (Patient taking differently: Place 25 mg rectally every 8 (eight) hours as needed for nausea or vomiting. )  . tiZANidine (ZANAFLEX) 4 MG tablet Take 4 mg by mouth 3 (three) times daily.  Marland Kitchen venlafaxine XR (EFFEXOR-XR) 75 MG 24 hr capsule Take 75 mg by mouth daily.     No Known Allergies  Social History   Socioeconomic History  . Marital status: Married    Spouse name: Not on file  . Number of children: 3  . Years of education: Not on file  . Highest education level: Not on file  Occupational History  . Occupation: disabled  Tobacco Use  . Smoking status: Never Smoker  . Smokeless tobacco: Never Used  Substance and Sexual Activity  . Alcohol use: No    Alcohol/week: 0.0 standard drinks  . Drug use: Yes    Types: Marijuana  . Sexual activity: Not on file  Other Topics Concern  . Not on file  Social History Narrative  . Not on file   Social Determinants of Health   Financial Resource Strain:   . Difficulty of Paying Living Expenses: Not on  file  Food Insecurity:   . Worried About Charity fundraiser in the Last Year: Not on file  . Ran Out of Food in the Last Year: Not on file  Transportation Needs:   . Lack of Transportation (Medical): Not on file  . Lack of Transportation (Non-Medical): Not on file  Physical Activity:   . Days of Exercise per Week: Not on file  . Minutes of Exercise per Session: Not on file  Stress:   . Feeling of Stress : Not on file  Social Connections:   . Frequency of Communication with Friends and Family: Not on file  . Frequency of Social Gatherings with Friends and Family: Not on file  . Attends Religious Services: Not on file  . Active Member of Clubs or Organizations: Not on file  . Attends English as a second language teacher Meetings: Not on file  . Marital Status: Not on file  Intimate Partner Violence:   . Fear of Current or Ex-Partner: Not on file  . Emotionally Abused: Not on file  . Physically Abused: Not on file  . Sexually Abused: Not on file     Review of Systems: General: negative for chills, fever, night sweats or weight changes.  Cardiovascular: negative for chest pain, dyspnea on exertion, edema, orthopnea, palpitations, paroxysmal nocturnal dyspnea or shortness of breath Dermatological: negative for rash Respiratory: negative for cough or wheezing Urologic: negative for hematuria Abdominal: negative for nausea, vomiting, diarrhea, bright red blood per rectum, melena, or hematemesis Neurologic: negative for visual changes, syncope, or dizziness All other systems reviewed and are otherwise negative except as noted above.    Blood pressure 111/76, pulse 71, height 5\' 6"  (1.676 m), weight 138 lb (62.6 kg), SpO2 97 %.  General appearance: alert and no distress Neck: no adenopathy, no JVD, supple, symmetrical, trachea midline, thyroid not enlarged, symmetric, no tenderness/mass/nodules and Soft bilateral carotid bruits Lungs: clear to auscultation bilaterally Heart: Soft outflow tract murmur Extremities: extremities normal, atraumatic, no cyanosis or edema Pulses: 2+ and symmetric Skin: Skin color, texture, turgor normal. No rashes or lesions Neurologic: Alert and oriented X 3, normal strength and tone. Normal symmetric reflexes. Normal coordination and gait  EKG sinus rhythm at 71 with septal Q waves.  I personally reviewed this EKG.  ASSESSMENT AND PLAN:   Atypical chest pain Atypical chest pain since August 1 mostly with exertion.  She does have positive risk factors including family history and hyperlipidemia.  I am going to get a coronary CTA to further evaluate  Nonspecific abnormal electrocardiogram (ECG) (EKG) Septal Q waves on 12 electrocardiogram.  We will check a  2D echocardiogram to look for wall motion normalities.  I suspect this is just lead placement  Hyperlipidemia History of hyperlipidemia on Vytorin.  We will check a fasting lipid liver profile today.      Lorretta Harp MD FACP,FACC,FAHA, [facility] 12/03/2019 9:10 AM

## 2019-12-10 ENCOUNTER — Other Ambulatory Visit: Payer: Self-pay

## 2019-12-10 ENCOUNTER — Ambulatory Visit (HOSPITAL_COMMUNITY)
Admission: RE | Admit: 2019-12-10 | Discharge: 2019-12-10 | Disposition: A | Discharge status: Home / Self Care | Payer: Medicare Other | Source: Ambulatory Visit | Attending: Internal Medicine | Admitting: Internal Medicine

## 2019-12-10 DIAGNOSIS — R0989 Other specified symptoms and signs involving the circulatory and respiratory systems: Secondary | ICD-10-CM | POA: Insufficient documentation

## 2019-12-11 ENCOUNTER — Telehealth: Payer: Self-pay | Admitting: Neurology

## 2019-12-11 ENCOUNTER — Ambulatory Visit: Payer: Medicare Other | Admitting: Neurology

## 2019-12-11 ENCOUNTER — Encounter: Payer: Self-pay | Admitting: Neurology

## 2019-12-11 VITALS — BP 139/50 | HR 85 | Temp 96.8°F | Ht 66.0 in | Wt 139.0 lb

## 2019-12-11 DIAGNOSIS — F05 Delirium due to known physiological condition: Secondary | ICD-10-CM

## 2019-12-11 DIAGNOSIS — I6990 Unspecified sequelae of unspecified cerebrovascular disease: Secondary | ICD-10-CM | POA: Diagnosis not present

## 2019-12-11 DIAGNOSIS — G43009 Migraine without aura, not intractable, without status migrainosus: Secondary | ICD-10-CM

## 2019-12-11 MED ORDER — EZETIMIBE-SIMVASTATIN 10-20 MG PO TABS: 1.0000 | ORAL_TABLET | Freq: Every day | ORAL | 11 refills | Status: DC

## 2019-12-11 MED ORDER — TOPIRAMATE 25 MG PO TABS: 25.0000 mg | ORAL_TABLET | Freq: Every day | ORAL | 2 refills | Status: DC

## 2019-12-11 NOTE — Telephone Encounter (Signed)
[employer] medicare order sent to [facility]. No auth they will reach out to the patient to schedule.  

## 2019-12-11 NOTE — Progress Notes (Signed)
[facility] [address] [place]. [place] [address] [phone]       OFFICE CONSULT NOTE  [name] Date of Birth:  Jun 29, 1956 Medical Record Number:  [id number]   Referring MD:  Apolonio Schneiders Reason for Referral:   Stroke  HPI: [name] is a 64 year old lady seen today for initial office consultation visit upon referral from Dr. Apolonio Schneiders.  History is obtained from the patient and review of referral notes as well as electronic medical records. She states that she is had an episode in August 2020 when she was disoriented and confused for a while.  She got out of her car with her husband besides her and started walking.  She did not return and her husband could not raise her.  She was gone for several hours and eventually walked 6 miles to her home.  She states she is not aware of her surroundings and has no memory as to how she got there and why she was walking.  She denies any headache after this did not feel tired.  She had no injuries tongue bite.  She did not seek medical help and was not evaluated for this.  She also states that she had other what she calls a little spells in which she gets a warning when I start getting blurry and she feels that she is confused but is able to respond and is aware of her surroundings.  She started seeing squiggly lines and dark spots.  These episodes occur once every 2 weeks or so and last for only a few minutes.  She is unable to identify specific triggers for these episodes.  She takes a couple of aspirin and this episode stops.  She denies any known prior history of migraine headaches or similar visual episodes.  She has had some recent work-up and underwent carotid ultrasound on 12/10/2019 in Dr. Naida Sleight office and showed no significant carotid stenosis on either side.  LDL cholesterol on 12/03/2019 was 85 mg percent.  She had previous stroke in April 2015 when actually I saw her in the hospital she presented with a left parietal  MCA branch infarct with an MRI showing area of focal signal loss unclear as to embolus versus intrinsic narrowing.  Urine drug screen at that time was positive for cocaine.  Patient states she is quit drug since then.  She however admits to smoking weed but she states that she takes this because of chronic back pain.  She does not smoke cigarettes or drink alcohol.  She has been on aspirin as well as Persantine for stroke prevention.  She takes Vytorin for her will hyperlipidemia and is on chronic pain medications for back pain which include Flexeril, Zanaflex, Norco and gabapentin. ROS:   14 system review of systems is positive for confusion, disorientation, memory loss, blurred vision, visual spots, headache, neck pain and all other systems negative PMH:  Past Medical History:  Diagnosis Date  . Abdominal pain    chronic, recurrent  . Allergy   . Bipolar affective disorder ([place])   . Chronic headaches   . Chronic pain   . Colon polyps    adenomatous  . Cyclical vomiting syndrome   . Depression   . Diverticulosis   . Endometriosis   . Fibrocystic breast   . GERD (gastroesophageal reflux disease)   . Hiatal hernia   . Hiatal hernia   . IBS (irritable bowel syndrome)   . Marijuana dependence ([place])   .  Status post repair of paraesophageal diaphragmatic hernia   . Stroke [facility]) 12/2013    Social History:  Social History   Socioeconomic History  . Marital status: Married    Spouse name: Not on file  . Number of children: 3  . Years of education: Not on file  . Highest education level: Not on file  Occupational History  . Occupation: disabled  Tobacco Use  . Smoking status: Never Smoker  . Smokeless tobacco: Never Used  Substance and Sexual Activity  . Alcohol use: No    Alcohol/week: 0.0 standard drinks  . Drug use: Yes    Types: Marijuana    Comment: smokes daily   . Sexual activity: Not on file  Other Topics Concern  . Not on file  Social History Narrative  . Not on  file   Social Determinants of Health   Financial Resource Strain:   . Difficulty of Paying Living Expenses: Not on file  Food Insecurity:   . Worried About Charity fundraiser in the Last Year: Not on file  . Ran Out of Food in the Last Year: Not on file  Transportation Needs:   . Lack of Transportation (Medical): Not on file  . Lack of Transportation (Non-Medical): Not on file  Physical Activity:   . Days of Exercise per Week: Not on file  . Minutes of Exercise per Session: Not on file  Stress:   . Feeling of Stress : Not on file  Social Connections:   . Frequency of Communication with Friends and Family: Not on file  . Frequency of Social Gatherings with Friends and Family: Not on file  . Attends Religious Services: Not on file  . Active Member of Clubs or Organizations: Not on file  . Attends Archivist Meetings: Not on file  . Marital Status: Not on file  Intimate Partner Violence:   . Fear of Current or Ex-Partner: Not on file  . Emotionally Abused: Not on file  . Physically Abused: Not on file  . Sexually Abused: Not on file    Medications:   Current Outpatient Medications on File Prior to Visit  Medication Sig Dispense Refill  . aspirin EC 325 MG tablet Take 325 mg by mouth daily.    . cyclobenzaprine (FLEXERIL) 10 MG tablet Take 10 mg by mouth 3 (three) times daily as needed for muscle spasms.    . diclofenac sodium (VOLTAREN) 1 % GEL Apply 1 g topically 3 (three) times daily as needed (back and elbow pain).     Marland Kitchen dicyclomine (BENTYL) 20 MG tablet Take 1 tablet (20 mg total) by mouth 2 (two) times daily. 20 tablet 0  . furosemide (LASIX) 40 MG tablet Take 40 mg by mouth as needed.     . gabapentin (NEURONTIN) 300 MG capsule Take 900 mg by mouth 3 (three) times daily as needed (nerve pain).     Marland Kitchen HYDROcodone-acetaminophen (NORCO/VICODIN) 5-325 MG tablet Take 1 tablet by mouth 4 (four) times daily as needed.    . lamoTRIgine (LAMICTAL) 150 MG tablet Take 150  mg by mouth at bedtime.     Marland Kitchen LINZESS 290 MCG CAPS capsule Take 290 mcg by mouth daily.    . ondansetron (ZOFRAN ODT) 4 MG disintegrating tablet Take 1 tablet (4 mg total) by mouth every 8 (eight) hours as needed for nausea. 10 tablet 0  . pantoprazole (PROTONIX) 40 MG tablet Take 1 tablet (40 mg total) by mouth daily. 30 tablet 11  .  perphenazine (TRILAFON) 4 MG tablet Take 4 mg by mouth 3 (three) times daily.     . potassium chloride SA (K-DUR,KLOR-CON) 20 MEQ tablet Take 2 tablets (40 mEq total) by mouth 2 (two) times daily for 10 days. (Patient taking differently: Take 40 mEq by mouth as needed. ) 40 tablet 0  . pramipexole (MIRAPEX) 0.5 MG tablet Take 0.5 mg by mouth at bedtime.  0  . promethazine (PHENERGAN) 25 MG suppository INSERT 1 SUPPOSITORY INTO RECTUM EVERY 8 HOURS AS NEEDED FOR NAUSEA OR VOMITING (Patient taking differently: Place 25 mg rectally every 8 (eight) hours as needed for nausea or vomiting. ) 30 suppository 3  . sucralfate (CARAFATE) 1 g tablet Take 1 g by mouth 4 (four) times daily -  with meals and at bedtime.    Marland Kitchen tiZANidine (ZANAFLEX) 4 MG tablet Take 4 mg by mouth 3 (three) times daily.    Marland Kitchen venlafaxine XR (EFFEXOR-XR) 75 MG 24 hr capsule Take 75 mg by mouth daily.     No current facility-administered medications on file prior to visit.    Allergies:   Allergies  Allergen Reactions  . Dust Mite Extract   . Lyrica [Pregabalin]     Cause bladder issues   . Smoked Meat [Pickled Meat]     Physical Exam General: well developed, well nourished middle-aged lady, seated, in no evident distress Head: head normocephalic and atraumatic.   Neck: supple with no carotid or supraclavicular bruits Cardiovascular: regular rate and rhythm, no murmurs Musculoskeletal: no deformity.  Mild tightness of posterior cervical muscles Skin:  no rash/petichiae Vascular:  Normal pulses all extremities  Neurologic Exam Mental Status: Awake and fully alert. Oriented to place and time.  Recent and remote memory intact. Attention span, concentration and fund of knowledge appropriate. Mood and affect appropriate.  Cranial Nerves: Fundoscopic exam reveals sharp disc margins. Pupils equal, briskly reactive to light. Extraocular movements full without nystagmus. Visual fields full to confrontation. Hearing intact. Facial sensation intact. Face, tongue, palate moves normally and symmetrically.  Motor: Normal bulk and tone. Normal strength in all tested extremity muscles. Sensory.: intact to touch , pinprick , position and vibratory sensation.  Coordination: Rapid alternating movements normal in all extremities. Finger-to-nose and heel-to-shin performed accurately bilaterally. Gait and Station: Arises from chair without difficulty. Stance is normal. Gait demonstrates normal stride length and balance . Able to heel, toe and tandem walk with mild difficulty.  Reflexes: 1+ and symmetric. Toes downgoing.   NIHSS  0 Modified Rankin  1   ASSESSMENT: 64 year old lady with episode of confusion/disorientation and memory loss in August 2020 of unclear etiology possibilities include complicated migraine versus complex partial seizure or vertebrobasilar TIA.  She also has recurrent transient episodes of visual disturbances and blurred vision with slight confusion possibly complicated migraine episodes.  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  Vascular risk factors of hyperlipidemia, hypertension, smoking marijuana and remote history of drug abuse     PLAN: I had a long discussion with the patient with regards to her episode of confusion disorientation and memory loss being of unclear etiology and possibilities include vertebrobasilar TIA versus complex partial seizure and complicated migraine would be less likely.  She is also having recurrent episodes of transient visual disturbance and seeing bright lights which may represent visual  migraines.  I recommend a trial of Topamax 25 mg twice daily for prevention of migraines and check EEG for seizure  activity and MRI scan of the brain with MRA of the brain to follow her previous known left MCA stenosis.  I recommend she discontinue Persantine and stay on aspirin alone for stroke prevention and maintain aggressive risk factor modification with strict control of hypertension with blood pressure goal below 130/90, lipids with LDL cholesterol goal below 70 mg percent.  She was counseled to quit marijuana.  Increase dose of Vytorin to 10/21 tablet daily since last LDL cholesterol was 85 12/03/2019.  Greater than 50% time during this 50-minute consultation visit was spent on counseling and coordination of care about her episode of confusion/disorientation as well as vision disturbances and discussion about evaluation, prevention and treatment and answering questions.  She will return for follow-up in the future in 2 months or call earlier if necessary. Antony Contras, MD Note: This document was prepared with digital dictation and possible smart phrase technology. Any transcriptional errors that result from this process are unintentional.

## 2019-12-11 NOTE — Patient Instructions (Signed)
I had a long discussion with the patient with regards to her episode of confusion disorientation and memory loss being of unclear etiology and possibilities include vertebrobasilar TIA versus complex partial seizure and complicated migraine would be less likely.  She is also having recurrent episodes of transient visual disturbance and seeing bright lights which may represent visual migraines.  I recommend a trial of Topamax 25 mg twice daily for prevention of migraines and check EEG for seizure activity and MRI scan of the brain with MRA of the brain to follow her previous known left MCA stenosis.  I recommend she discontinue Persantine and stay on aspirin alone for stroke prevention and maintain aggressive risk factor modification with strict control of hypertension with blood pressure goal below 130/90, lipids with LDL cholesterol goal below 70 mg percent.  She was counseled to quit marijuana.  Increase dose of Vytorin to 10/21 tablet daily since last LDL cholesterol was 85 12/03/2019.  She will return for follow-up in the future in 2 months or call earlier if necessary  Stroke Prevention Some medical conditions and behaviors are associated with a higher chance of having a stroke. You can help prevent a stroke by making nutrition, lifestyle, and other changes, including managing any medical conditions you may have. What nutrition changes can be made?   Eat healthy foods. You can do this by: ? Choosing foods high in fiber, such as fresh fruits and vegetables and whole grains. ? Eating at least 5 or more servings of fruits and vegetables a day. Try to fill half of your plate at each meal with fruits and vegetables. ? Choosing lean protein foods, such as lean cuts of meat, poultry without skin, fish, tofu, beans, and nuts. ? Eating low-fat dairy products. ? Avoiding foods that are high in salt (sodium). This can help lower blood pressure. ? Avoiding foods that have saturated fat, trans fat, and  cholesterol. This can help prevent high cholesterol. ? Avoiding processed and premade foods.  Follow your health care provider's specific guidelines for losing weight, controlling high blood pressure (hypertension), lowering high cholesterol, and managing diabetes. These may include: ? Reducing your daily calorie intake. ? Limiting your daily sodium intake to 1,500 milligrams (mg). ? Using only healthy fats for cooking, such as olive oil, canola oil, or sunflower oil. ? Counting your daily carbohydrate intake. What lifestyle changes can be made?  Maintain a healthy weight. Talk to your health care provider about your ideal weight.  Get at least 30 minutes of moderate physical activity at least 5 days a week. Moderate activity includes brisk walking, biking, and swimming.  Do not use any products that contain nicotine or tobacco, such as cigarettes and e-cigarettes. If you need help quitting, ask your health care provider. It may also be helpful to avoid exposure to secondhand smoke.  Limit alcohol intake to no more than 1 drink a day for nonpregnant women and 2 drinks a day for men. One drink equals 12 oz of beer, 5 oz of wine, or 1 oz of hard liquor.  Stop any illegal drug use.  Avoid taking birth control pills. Talk to your health care provider about the risks of taking birth control pills if: ? You are over 75 years old. ? You smoke. ? You get migraines. ? You have ever had a blood clot. What other changes can be made?  Manage your cholesterol levels. ? Eating a healthy diet is important for preventing high cholesterol. If cholesterol cannot be managed  through diet alone, you may also need to take medicines. ? Take any prescribed medicines to control your cholesterol as told by your health care provider.  Manage your diabetes. ? Eating a healthy diet and exercising regularly are important parts of managing your blood sugar. If your blood sugar cannot be managed through diet and  exercise, you may need to take medicines. ? Take any prescribed medicines to control your diabetes as told by your health care provider.  Control your hypertension. ? To reduce your risk of stroke, try to keep your blood pressure below 130/80. ? Eating a healthy diet and exercising regularly are an important part of controlling your blood pressure. If your blood pressure cannot be managed through diet and exercise, you may need to take medicines. ? Take any prescribed medicines to control hypertension as told by your health care provider. ? Ask your health care provider if you should monitor your blood pressure at home. ? Have your blood pressure checked every year, even if your blood pressure is normal. Blood pressure increases with age and some medical conditions.  Get evaluated for sleep disorders (sleep apnea). Talk to your health care provider about getting a sleep evaluation if you snore a lot or have excessive sleepiness.  Take over-the-counter and prescription medicines only as told by your health care provider. Aspirin or blood thinners (antiplatelets or anticoagulants) may be recommended to reduce your risk of forming blood clots that can lead to stroke.  Make sure that any other medical conditions you have, such as atrial fibrillation or atherosclerosis, are managed. What are the warning signs of a stroke? The warning signs of a stroke can be easily remembered as BEFAST.  B is for balance. Signs include: ? Dizziness. ? Loss of balance or coordination. ? Sudden trouble walking.  E is for eyes. Signs include: ? A sudden change in vision. ? Trouble seeing.  F is for face. Signs include: ? Sudden weakness or numbness of the face. ? The face or eyelid drooping to one side.  A is for arms. Signs include: ? Sudden weakness or numbness of the arm, usually on one side of the body.  S is for speech. Signs include: ? Trouble speaking (aphasia). ? Trouble understanding.  T is for  time. ? These symptoms may represent a serious problem that is an emergency. Do not wait to see if the symptoms will go away. Get medical help right away. Call your local emergency services (911 in the [place] Do not drive yourself to the hospital.  Other signs of stroke may include: ? A sudden, severe headache with no known cause. ? Nausea or vomiting. ? Seizure. Where to find more information For more information, visit:  American Stroke Association: www.strokeassociation.org  National Stroke Association: www.stroke.org Summary  You can prevent a stroke by eating healthy, exercising, not smoking, limiting alcohol intake, and managing any medical conditions you may have.  Do not use any products that contain nicotine or tobacco, such as cigarettes and e-cigarettes. If you need help quitting, ask your health care provider. It may also be helpful to avoid exposure to secondhand smoke.  Remember BEFAST for warning signs of stroke. Get help right away if you or a loved one has any of these signs. This information is not intended to replace advice given to you by your health care provider. Make sure you discuss any questions you have with your health care provider. Document Revised: 09/29/2017 Document Reviewed: 11/22/2016 Elsevier Patient Education  [phone]  [employer].

## 2019-12-13 ENCOUNTER — Other Ambulatory Visit: Payer: Self-pay

## 2019-12-13 ENCOUNTER — Ambulatory Visit (HOSPITAL_COMMUNITY): Payer: Medicare Other | Attending: Internal Medicine

## 2019-12-13 DIAGNOSIS — E782 Mixed hyperlipidemia: Secondary | ICD-10-CM | POA: Insufficient documentation

## 2019-12-13 DIAGNOSIS — R9431 Abnormal electrocardiogram [ECG] [EKG]: Secondary | ICD-10-CM | POA: Insufficient documentation

## 2019-12-13 DIAGNOSIS — R0789 Other chest pain: Secondary | ICD-10-CM

## 2019-12-13 DIAGNOSIS — R0602 Shortness of breath: Secondary | ICD-10-CM | POA: Diagnosis present

## 2019-12-18 ENCOUNTER — Ambulatory Visit: Payer: Medicare Other | Admitting: Internal Medicine

## 2019-12-18 ENCOUNTER — Encounter: Payer: Self-pay | Admitting: Internal Medicine

## 2019-12-18 VITALS — BP 130/62 | HR 68 | Temp 98.4°F | Ht 66.0 in | Wt 138.1 lb

## 2019-12-18 DIAGNOSIS — F1210 Cannabis abuse, uncomplicated: Secondary | ICD-10-CM | POA: Diagnosis not present

## 2019-12-18 DIAGNOSIS — Z01818 Encounter for other preprocedural examination: Secondary | ICD-10-CM | POA: Diagnosis not present

## 2019-12-18 DIAGNOSIS — R112 Nausea with vomiting, unspecified: Secondary | ICD-10-CM | POA: Diagnosis not present

## 2019-12-18 DIAGNOSIS — K219 Gastro-esophageal reflux disease without esophagitis: Secondary | ICD-10-CM

## 2019-12-18 DIAGNOSIS — R1115 Cyclical vomiting syndrome unrelated to migraine: Secondary | ICD-10-CM

## 2019-12-18 NOTE — Patient Instructions (Signed)
If you are age 63 or older, your body mass index should be between 23-30. Your Body mass index is 22.29 kg/m. If this is out of the aforementioned range listed, please consider follow up with your Primary Care Provider.  If you are age 43 or younger, your body mass index should be between 19-25. Your Body mass index is 22.29 kg/m. If this is out of the aformentioned range listed, please consider follow up with your Primary Care Provider.    You have been scheduled for an endoscopy. Please follow written instructions given to you at your visit today. If you use inhalers (even only as needed), please bring them with you on the day of your procedure.

## 2019-12-18 NOTE — Progress Notes (Signed)
HISTORY OF PRESENT ILLNESS:  [name] is a 64 y.o. female with multiple medical problems including GERD, IBS, bipolar disorder, chronic headaches, prior stroke, cyclic vomiting, marijuana dependence, and history of paraesophageal hernia with repair (mesh and gastropexy November 2015 elsewhere).  Colonoscopy December 2016 with adenomatous polyps and sessile serrated polyps.  Last seen in this office February 2017.  The clinical impression at that time was chronic nausea and vomiting that was felt functional and related to chronic cannabis use.  Also, a number of prior upper GI complaints had improved post paraesophageal hernia repair.  Issues with IBS were ongoing she was encouraged to stop cannabis.  Patient tells me that over the past year or 2 she has had problems with cyclic vomiting which requires emergency room visits 3 or 4 times per year.  She is reluctant to believe that this is related to chronic marijuana use.  She continues to smoke 2 blunts per day.  She states that her symptoms typically last a day or so response to IV hydration and antiemetics.  Her last emergency room visit November 20, 2019.  Reviewed.  Blood work from December 03, 2019 reveals normal comprehensive metabolic panel including electrolytes.  Normal CBC with hemoglobin 14.0.  Last office visit with her PCP November 13, 2019 (Dr. Apolonio Schneiders) was reviewed.  Abdominal films obtained April 2020 regarding abdominal pain and vomiting revealed no acute abnormality or obstructive pattern.  Patient's review of systems includes abdominal pain, bloating, gas, chest pain, dysphagia, loss of appetite, nausea and vomiting, weight change, constipation, diarrhea, diverticulosis, hemorrhoids, and irritable bowel syndrome.  Recent echo showed ejection fraction 65%.  Patient is also now on chronic narcotics  REVIEW OF SYSTEMS:  All non-GI ROS negative unless otherwise stated in the HPI except for sinus allergy, anxiety, back pain, confusion,  depression, night sweats, cold sweats, heart murmur, headaches, sleeping problems, lower extremity swelling, shortness of breath  Past Medical History:  Diagnosis Date  . Abdominal pain    chronic, recurrent  . Allergy   . Bipolar affective disorder ([place])   . Chronic headaches   . Chronic pain   . Colon polyps    adenomatous  . Cyclical vomiting syndrome   . Depression   . Diverticulosis   . Endometriosis   . Fibrocystic breast   . GERD (gastroesophageal reflux disease)   . Hiatal hernia   . Hiatal hernia   . IBS (irritable bowel syndrome)   . Marijuana dependence ([place])   . Status post repair of paraesophageal diaphragmatic hernia   . Stroke [facility]) 12/2013    Past Surgical History:  Procedure Laterality Date  . ABDOMINAL HYSTERECTOMY  1990  . BREAST LUMPECTOMY Bilateral   . FOOT FRACTURE SURGERY Right    x 3  . HIATAL HERNIA REPAIR    . NISSEN FUNDOPLICATION  123456   hiatal hernia repair  . TOE SURGERY Left     Social History [name]  reports that she has never smoked. She has never used smokeless tobacco. She reports current drug use. Drug: Marijuana. She reports that she does not drink alcohol.  family history includes Brain cancer in her mother; Breast cancer in her paternal grandmother and sister; Diabetes in her mother and sister.  Allergies  Allergen Reactions  . Dust Mite Extract   . Lyrica [Pregabalin]     Cause bladder issues   . Smoked Meat [Pickled Meat]        PHYSICAL EXAMINATION: Vital signs: BP 130/62  Pulse 68   Temp 98.4 F (36.9 C)   Ht 5\' 6"  (1.676 m)   Wt 138 lb 2 oz (62.7 kg)   BMI 22.29 kg/m   Constitutional: Chronically ill-appearing, no acute distress Psychiatric: alert and oriented x3, cooperative Eyes: extraocular movements intact, anicteric, conjunctiva pink Mouth: oral pharynx moist, no lesions.  No thrush Neck: supple no lymphadenopathy Cardiovascular: heart regular rate and rhythm. Lungs: clear to  auscultation bilaterally Abdomen: soft, nontender, nondistended, no obvious ascites, no peritoneal signs, normal bowel sounds, no organomegaly Rectal: Omitted Extremities: no clubbing or cyanosis.  Trace lower extremity edema bilaterally Skin: no lesions on visible extremities Neuro: No focal deficits.  Cranial nerves intact  ASSESSMENT:  1.  Cyclic vomiting.  Felt secondary to chronic cannabis use.  Problem may be exacerbated by chronic narcotics 2.  History of paraesophageal hernia repair 3.  History of GERD.  On pantoprazole 4.  History of adenomatous colon polyps and sessile serrated polyps.  Last examination December 2016.  Due for follow-up around December 2021 5.  Multiple medical problems  PLAN:  1.  Stop smoking cannabis 2.  Schedule upper endoscopy to evaluate symptom complex.The nature of the procedure, as well as the risks, benefits, and alternatives were carefully and thoroughly reviewed with the patient. Ample time for discussion and questions allowed. The patient understood, was satisfied, and agreed to proceed. 3.  Reflux precautions 4.  Continue PPI 5.  Surveillance colonoscopy later this year.  She is aware 6.  Ongoing general medical care with PCP and multiple specialists A total time of 60 minutes was spent preparing to see this complex patient, reviewing multiple test, obtaining history and reviewing outside history is, performing comprehensive physical examination, counseling her regarding her above listed issues, ordering and reviewing endoscopic procedure, and documenting all clinical information in the health record

## 2019-12-19 ENCOUNTER — Encounter: Payer: Self-pay | Admitting: Internal Medicine

## 2019-12-20 ENCOUNTER — Encounter (HOSPITAL_COMMUNITY): Payer: Self-pay

## 2019-12-20 ENCOUNTER — Other Ambulatory Visit: Payer: Self-pay

## 2019-12-20 ENCOUNTER — Ambulatory Visit (HOSPITAL_COMMUNITY)
Admission: RE | Admit: 2019-12-20 | Discharge: 2019-12-20 | Disposition: A | Discharge status: Home / Self Care | Payer: Medicare Other | Source: Ambulatory Visit | Attending: Cardiovascular Disease | Admitting: Cardiovascular Disease

## 2019-12-20 DIAGNOSIS — R0602 Shortness of breath: Secondary | ICD-10-CM | POA: Insufficient documentation

## 2019-12-20 MED ORDER — SODIUM CHLORIDE 0.9 % IV BOLUS
500.0000 mL | Freq: Once | INTRAVENOUS | Status: AC
  Administered 2019-12-20: 500 mL via INTRAVENOUS

## 2019-12-20 MED ORDER — NITROGLYCERIN 0.4 MG SL SUBL: 0.4000 mg | SUBLINGUAL_TABLET | Freq: Once | SUBLINGUAL | Status: DC

## 2019-12-20 NOTE — Progress Notes (Signed)
To be rescheduled per md r/t bp being low

## 2019-12-23 ENCOUNTER — Other Ambulatory Visit: Payer: Self-pay

## 2019-12-23 ENCOUNTER — Ambulatory Visit (INDEPENDENT_AMBULATORY_CARE_PROVIDER_SITE_OTHER): Payer: Medicare Other

## 2019-12-23 ENCOUNTER — Ambulatory Visit: Payer: Medicare Other | Admitting: Neurology

## 2019-12-23 DIAGNOSIS — Z1159 Encounter for screening for other viral diseases: Secondary | ICD-10-CM

## 2019-12-23 DIAGNOSIS — F05 Delirium due to known physiological condition: Secondary | ICD-10-CM

## 2019-12-23 DIAGNOSIS — R410 Disorientation, unspecified: Secondary | ICD-10-CM | POA: Diagnosis not present

## 2019-12-24 LAB — SARS CORONAVIRUS 2 (TAT 6-24 HRS): SARS Coronavirus 2: NEGATIVE

## 2019-12-25 ENCOUNTER — Other Ambulatory Visit: Payer: Self-pay

## 2019-12-25 ENCOUNTER — Ambulatory Visit (AMBULATORY_SURGERY_CENTER): Payer: Medicare Other | Admitting: Internal Medicine

## 2019-12-25 ENCOUNTER — Encounter: Payer: Self-pay | Admitting: Internal Medicine

## 2019-12-25 VITALS — BP 97/62 | HR 63 | Temp 97.5°F | Resp 18 | Ht 66.0 in | Wt 138.0 lb

## 2019-12-25 DIAGNOSIS — R112 Nausea with vomiting, unspecified: Secondary | ICD-10-CM

## 2019-12-25 DIAGNOSIS — K253 Acute gastric ulcer without hemorrhage or perforation: Secondary | ICD-10-CM | POA: Diagnosis not present

## 2019-12-25 DIAGNOSIS — K2950 Unspecified chronic gastritis without bleeding: Secondary | ICD-10-CM | POA: Diagnosis not present

## 2019-12-25 MED ORDER — SODIUM CHLORIDE 0.9 % IV SOLN: 500.0000 mL | Freq: Once | INTRAVENOUS | Status: DC

## 2019-12-25 NOTE — Progress Notes (Signed)
Called to room to assist during endoscopic procedure.  Patient ID and intended procedure confirmed with present staff. Received instructions for my participation in the procedure from the performing physician.  

## 2019-12-25 NOTE — Patient Instructions (Signed)
YOU HAD AN ENDOSCOPIC PROCEDURE TODAY AT [facility]:   Refer to the procedure report that was given to you for any specific questions about what was found during the examination.  If the procedure report does not answer your questions, please call your gastroenterologist to clarify.  If you requested that your care partner not be given the details of your procedure findings, then the procedure report has been included in a sealed envelope for you to review at your convenience later.  YOU SHOULD EXPECT: Some feelings of bloating in the abdomen. Passage of more gas than usual.  Walking can help get rid of the air that was put into your GI tract during the procedure and reduce the bloating. If you had a lower endoscopy (such as a colonoscopy or flexible sigmoidoscopy) you may notice spotting of blood in your stool or on the toilet paper. If you underwent a bowel prep for your procedure, you may not have a normal bowel movement for a few days.  Please Note:  You might notice some irritation and congestion in your nose or some drainage.  This is from the oxygen used during your procedure.  There is no need for concern and it should clear up in a day or so.  SYMPTOMS TO REPORT IMMEDIATELY:     Following upper endoscopy (EGD)  Vomiting of blood or coffee ground material  New chest pain or pain under the shoulder blades  Painful or persistently difficult swallowing  New shortness of breath  Fever of 100F or higher  Black, tarry-looking stools  For urgent or emergent issues, a gastroenterologist can be reached at any hour by calling [phone].   DIET:  We do recommend a small meal at first, but then you may proceed to your regular diet.  Drink plenty of fluids but you should avoid alcoholic beverages for 24 hours.  ACTIVITY:  You should plan to take it easy for the rest of today and you should NOT DRIVE or use heavy machinery until tomorrow (because of the sedation medicines  used during the test).    FOLLOW UP: Our staff will call the number listed on your records 48-72 hours following your procedure to check on you and address any questions or concerns that you may have regarding the information given to you following your procedure. If we do not reach you, we will leave a message.  We will attempt to reach you two times.  During this call, we will ask if you have developed any symptoms of COVID 19. If you develop any symptoms (ie: fever, flu-like symptoms, shortness of breath, cough etc.) before then, please call [phone].  If you test positive for Covid 19 in the 2 weeks post procedure, please call and report this information to [place].    If any biopsies were taken you will be contacted by phone or by letter within the next 1-3 weeks.  Please call us at [phone] if you have not heard about the biopsies in 3 weeks.    SIGNATURES/CONFIDENTIALITY: You and/or your care partner have signed paperwork which will be entered into your electronic medical record.  These signatures attest to the fact that that the information above on your After Visit Summary has been reviewed and is understood.  Full responsibility of the confidentiality of this discharge information lies with you and/or your care-partner.  Resume medications. Follow up with Dr. Henrene Pastor in 4 to 6 weeks.

## 2019-12-25 NOTE — Op Note (Signed)
[name] Patient Name: [name] Procedure Date: 12/25/2019 10:17 AM MRN: [id number] Endoscopist: Docia Chuck. [name] , MD Age: 64 Referring MD:  Date of Birth: [birth year] Gender: Female Account #: [id number] Procedure:                Upper GI endoscopy with biopsies Indications:              Nausea with vomiting Medicines:                Monitored Anesthesia Care Procedure:                Pre-Anesthesia Assessment:                           - Prior to the procedure, a History and Physical                            was performed, and patient medications and                            allergies were reviewed. The patient's tolerance of                            previous anesthesia was also reviewed. The risks                            and benefits of the procedure and the sedation                            options and risks were discussed with the patient.                            All questions were answered, and informed consent                            was obtained. Prior Anticoagulants: The patient has                            taken no previous anticoagulant or antiplatelet                            agents. ASA Grade Assessment: II - A patient with                            mild systemic disease. After reviewing the risks                            and benefits, the patient was deemed in                            satisfactory condition to undergo the procedure.                           After obtaining informed consent, the endoscope was  passed under direct vision. Throughout the                            procedure, the patient's blood pressure, pulse, and                            oxygen saturations were monitored continuously. The                            Endoscope was introduced through the mouth, and                            advanced to the second part of duodenum. The upper                            GI endoscopy was  accomplished without difficulty.                            The patient tolerated the procedure well. Scope In: Scope Out: Findings:                 The esophagus was normal.                           The stomach revealed several superficial antral                            erosions as well as small superficial ulceration.                            Biopsies were taken with a cold forceps for                            Helicobacter pylori testing using CLOtest. The                            previous fundoplication was intact                           The examined duodenum hyperemic mucosa but was                            otherwise normal.                           The cardia and gastric fundus were normal on                            retroflexion. Complications:            No immediate complications. Estimated Blood Loss:     Estimated blood loss: none. Impression:               1. Prior fundoplication intact                           2. Antral gastritis status post  CLO biopsy                           3. Cyclic vomiting likely secondary to chronic                            cannabis use exacerbated by narcotic pain                            medication.                           4. Otherwise normal EGD. Recommendation:           1. Resume previous medications and diet                           2. Please take recently prescribed pantoprazole 40                            mg DAILY                           3. Stop smoking cannabis as previously recommended                           4. Smaller meal size may be helpful                           5. Office follow-up in 4 to 6 weeks with Dr. Irene Shipper. Anne Pastor, MD 12/25/2019 10:33:29 AM This report has been signed electronically.

## 2019-12-25 NOTE — Progress Notes (Signed)
PT taken to PACU. Monitors in place. VSS. Report given to RN. 

## 2019-12-25 NOTE — Progress Notes (Signed)
Pt's states no medical or surgical changes since previsit or office visit.  Temp- [name]

## 2019-12-26 LAB — HELICOBACTER PYLORI SCREEN-BIOPSY: UREASE: NEGATIVE

## 2019-12-27 ENCOUNTER — Telehealth: Payer: Self-pay

## 2019-12-27 ENCOUNTER — Other Ambulatory Visit: Payer: Self-pay

## 2019-12-27 ENCOUNTER — Encounter: Payer: Self-pay | Admitting: Cardiovascular Disease

## 2019-12-27 ENCOUNTER — Ambulatory Visit: Payer: Medicare Other | Admitting: Cardiovascular Disease

## 2019-12-27 VITALS — BP 104/65 | HR 71 | Ht 66.0 in | Wt 136.2 lb

## 2019-12-27 DIAGNOSIS — I421 Obstructive hypertrophic cardiomyopathy: Secondary | ICD-10-CM | POA: Diagnosis not present

## 2019-12-27 DIAGNOSIS — R0989 Other specified symptoms and signs involving the circulatory and respiratory systems: Secondary | ICD-10-CM

## 2019-12-27 NOTE — Progress Notes (Signed)
Ms. Linnear returns today for follow-up of her 2D echo performed 12/13/2019.  This did show moderate asymmetric septal hypertrophy consistent with a significant outflow tract gradient of 66 mmHg without chordal Sam.  When compared to his echo performed in 2015 his basal septum is thicker.  I am to get a cardiac MRI to further evaluate.  Patient is scheduled for a cardiac CTA March 16.  I will see her back in 1 month to review all her noninvasive information.  Lorretta Harp, M.D., [place], [facility], [place] [address] [place], [place]  [address]  [phone] 12/27/2019 4:49 PM

## 2019-12-27 NOTE — Patient Instructions (Addendum)
Medication Instructions:  Your physician recommends that you continue on your current medications as directed. Please refer to the Current Medication list given to you today.  If you need a refill on your cardiac medications before your next appointment, please call your pharmacy.   Lab work: BMET  Testing/Procedures: Your physician has requested that you have cardiac CT. Cardiac computed tomography (CT) is a painless test that uses an x-ray machine to take clear, detailed pictures of your heart. For further information please visit [email or web address]. Please follow instruction sheet as given.  Follow-Up: At [facility], you and your health needs are our priority.  As part of our continuing mission to provide you with exceptional heart care, we have created designated Provider Care Teams.  These Care Teams include your primary Cardiologist (physician) and Advanced Practice Providers (APPs -  Physician Assistants and Nurse Practitioners) who all work together to provide you with the care you need, when you need it. You may see Dr. Gwenlyn Found or one of the following Advanced Practice Providers on your designated Care Team:    Kerin Ransom, PA-C  [place], [place]  Coletta Memos, [place]  Your physician wants you to follow-up in: 1 month with Dr. Gwenlyn Found

## 2019-12-27 NOTE — Telephone Encounter (Signed)
  Follow up Call-  Call back number 12/25/2019  Post procedure Call Back phone  # [phone]  Permission to leave phone message Yes  Some recent data might be hidden     Patient questions:  Do you have a fever, pain , or abdominal swelling? No. Pain Score  0 *  Have you tolerated food without any problems? Yes.    Have you been able to return to your normal activities? Yes.    Do you have any questions about your discharge instructions: Diet   No. Medications  No. Follow up visit  No.  Do you have questions or concerns about your Care? No.  Actions: * If pain score is 4 or above: No action needed, pain <4.  1. Have you developed a fever since your procedure? no  2.   Have you had an respiratory symptoms (SOB or cough) since your procedure? no  3.   Have you tested positive for COVID 19 since your procedure no  4.   Have you had any family members/close contacts diagnosed with the COVID 19 since your procedure?  no   If yes to any of these questions please route to [name], RN and [name], Therapist, sports.

## 2019-12-31 NOTE — Progress Notes (Signed)
Kindly inform the patient that EEG study was normal

## 2020-01-01 ENCOUNTER — Telehealth: Payer: Self-pay | Admitting: *Deleted

## 2020-01-01 NOTE — Telephone Encounter (Signed)
Tried to reach pt twice. Unable to leave messages. Received "call cannot complete" message w/ home # and mobile/work # vm not setup.

## 2020-01-01 NOTE — Telephone Encounter (Signed)
-----   Message from Garvin Fila, MD sent at 12/31/2019  6:02 PM EST ----- Kindly inform the patient that EEG study was normal

## 2020-01-02 NOTE — Telephone Encounter (Signed)
Tried to call pt on all 3 numbers. Unable to reach pt. No vm setup.

## 2020-01-06 ENCOUNTER — Other Ambulatory Visit: Payer: Self-pay

## 2020-01-06 ENCOUNTER — Emergency Department (HOSPITAL_COMMUNITY): Payer: Medicare Other

## 2020-01-06 ENCOUNTER — Observation Stay (HOSPITAL_COMMUNITY)
Admission: EM | Admit: 2020-01-06 | Discharge: 2020-01-08 | Disposition: A | Discharge status: Home / Self Care | Payer: Medicare Other | Attending: Internal Medicine | Admitting: Internal Medicine

## 2020-01-06 ENCOUNTER — Encounter (HOSPITAL_COMMUNITY): Payer: Self-pay | Admitting: Emergency Medicine

## 2020-01-06 ENCOUNTER — Telehealth: Payer: Self-pay | Admitting: Cardiovascular Disease

## 2020-01-06 DIAGNOSIS — E785 Hyperlipidemia, unspecified: Secondary | ICD-10-CM | POA: Diagnosis not present

## 2020-01-06 DIAGNOSIS — R200 Anesthesia of skin: Secondary | ICD-10-CM | POA: Insufficient documentation

## 2020-01-06 DIAGNOSIS — Z7982 Long term (current) use of aspirin: Secondary | ICD-10-CM | POA: Insufficient documentation

## 2020-01-06 DIAGNOSIS — R0789 Other chest pain: Secondary | ICD-10-CM | POA: Diagnosis not present

## 2020-01-06 DIAGNOSIS — G8929 Other chronic pain: Secondary | ICD-10-CM | POA: Diagnosis present

## 2020-01-06 DIAGNOSIS — E876 Hypokalemia: Secondary | ICD-10-CM | POA: Diagnosis not present

## 2020-01-06 DIAGNOSIS — R079 Chest pain, unspecified: Secondary | ICD-10-CM

## 2020-01-06 DIAGNOSIS — F1110 Opioid abuse, uncomplicated: Secondary | ICD-10-CM | POA: Diagnosis not present

## 2020-01-06 DIAGNOSIS — Z79899 Other long term (current) drug therapy: Secondary | ICD-10-CM | POA: Insufficient documentation

## 2020-01-06 DIAGNOSIS — F1910 Other psychoactive substance abuse, uncomplicated: Secondary | ICD-10-CM | POA: Diagnosis present

## 2020-01-06 DIAGNOSIS — Z888 Allergy status to other drugs, medicaments and biological substances status: Secondary | ICD-10-CM | POA: Diagnosis not present

## 2020-01-06 DIAGNOSIS — Z20822 Contact with and (suspected) exposure to covid-19: Secondary | ICD-10-CM | POA: Diagnosis not present

## 2020-01-06 DIAGNOSIS — F1220 Cannabis dependence, uncomplicated: Secondary | ICD-10-CM | POA: Diagnosis not present

## 2020-01-06 DIAGNOSIS — F319 Bipolar disorder, unspecified: Secondary | ICD-10-CM | POA: Diagnosis not present

## 2020-01-06 DIAGNOSIS — I517 Cardiomegaly: Secondary | ICD-10-CM | POA: Diagnosis not present

## 2020-01-06 LAB — BASIC METABOLIC PANEL
Anion gap: 14 (ref 5–15)
BUN: 23 mg/dL (ref 8–23)
CO2: 21 mmol/L — ABNORMAL LOW (ref 22–32)
Calcium: 9.2 mg/dL (ref 8.9–10.3)
Chloride: 99 mmol/L (ref 98–111)
Creatinine, Ser: 0.85 mg/dL (ref 0.44–1.00)
GFR calc Af Amer: >60 mL/min (ref >60)
GFR calc non Af Amer: >60 mL/min (ref >60)
Glucose, Bld: 129 mg/dL — ABNORMAL HIGH (ref 70–99)
Potassium: 2.9 mmol/L — ABNORMAL LOW (ref 3.5–5.1)
Sodium: 134 mmol/L — ABNORMAL LOW (ref 135–145)

## 2020-01-06 LAB — CBC
HCT: 40.3 % (ref 36.0–46.0)
Hemoglobin: 13.9 g/dL (ref 12.0–15.0)
MCH: 33.8 pg (ref 26.0–34.0)
MCHC: 34.5 g/dL (ref 30.0–36.0)
MCV: 98.1 fL (ref 80.0–100.0)
Platelets: 278 10*3/uL (ref 150–400)
RBC: 4.11 MIL/uL (ref 3.87–5.11)
RDW: 11.9 % (ref 11.5–15.5)
WBC: 11.2 10*3/uL — ABNORMAL HIGH (ref 4.0–10.5)
nRBC: 0 % (ref 0.0–0.2)

## 2020-01-06 LAB — TROPONIN I (HIGH SENSITIVITY)
Troponin I (High Sensitivity): 13 ng/L (ref <18)
Troponin I (High Sensitivity): 12 ng/L (ref <18)

## 2020-01-06 MED ORDER — SODIUM CHLORIDE 0.9% FLUSH: 3.0000 mL | Freq: Once | INTRAVENOUS | Status: DC

## 2020-01-06 NOTE — Telephone Encounter (Signed)
Returned the call to the patient. She stated that for the past three days she "has been off" with shortness of breath, chest pain, memory loss, shakiness, numbness in the face, and dropping stuff from her left hand.  The patient has been made aware that with those symptoms she should go straight to the Emergency room or call 911.   She has verbalized her understanding.

## 2020-01-06 NOTE — Telephone Encounter (Signed)
New Message:     Pt says she is acting like she had a stroke. She says she is real shaky, vision and balance is off and dropping  Things.,

## 2020-01-06 NOTE — ED Triage Notes (Signed)
Per EMS, pt from home, C/O heavy Chest pain substernal  X3 days.  See's Dr. Gwenlyn Found, diagnosed w/ LVH.  Took 325 ASA at home pior to EMS getting there.  EMS reports some anxiety and labile mood (crying then "less responsive").    18 L arm 122/67 80 HR 99% on RA

## 2020-01-06 NOTE — Telephone Encounter (Signed)
Tried to reach pt again on home & mobile. Unable to reach pt, vm not setup on mobile. I called the work number on file and was able to reach pt. I let her know her EEG was normal, no seizures noted. Pt verbalized understanding and appreciation. Next appt 02/17/20 @ 1:00 pm.

## 2020-01-07 ENCOUNTER — Encounter (HOSPITAL_COMMUNITY): Payer: Self-pay | Admitting: Internal Medicine

## 2020-01-07 ENCOUNTER — Observation Stay (HOSPITAL_COMMUNITY): Payer: Medicare Other

## 2020-01-07 DIAGNOSIS — E782 Mixed hyperlipidemia: Secondary | ICD-10-CM

## 2020-01-07 DIAGNOSIS — E876 Hypokalemia: Secondary | ICD-10-CM | POA: Diagnosis present

## 2020-01-07 DIAGNOSIS — R0789 Other chest pain: Secondary | ICD-10-CM

## 2020-01-07 DIAGNOSIS — F3170 Bipolar disorder, currently in remission, most recent episode unspecified: Secondary | ICD-10-CM | POA: Diagnosis not present

## 2020-01-07 DIAGNOSIS — R072 Precordial pain: Secondary | ICD-10-CM

## 2020-01-07 DIAGNOSIS — G8929 Other chronic pain: Secondary | ICD-10-CM | POA: Diagnosis present

## 2020-01-07 DIAGNOSIS — R079 Chest pain, unspecified: Secondary | ICD-10-CM | POA: Insufficient documentation

## 2020-01-07 DIAGNOSIS — F1910 Other psychoactive substance abuse, uncomplicated: Secondary | ICD-10-CM

## 2020-01-07 DIAGNOSIS — I700 Atherosclerosis of aorta: Secondary | ICD-10-CM

## 2020-01-07 DIAGNOSIS — I517 Cardiomegaly: Secondary | ICD-10-CM | POA: Diagnosis present

## 2020-01-07 LAB — HEMOGLOBIN A1C
Hgb A1c MFr Bld: 5.1 % (ref 4.8–5.6)
Hgb A1c MFr Bld: 5.1 % (ref 4.8–5.6)
Mean Plasma Glucose: 99.67 mg/dL
Mean Plasma Glucose: 99.67 mg/dL

## 2020-01-07 LAB — TSH: TSH: 2.547 u[IU]/mL (ref 0.350–4.500)

## 2020-01-07 LAB — SARS CORONAVIRUS 2 (TAT 6-24 HRS): SARS Coronavirus 2: NEGATIVE

## 2020-01-07 LAB — RAPID URINE DRUG SCREEN, HOSP PERFORMED
Amphetamines: NOT DETECTED
Barbiturates: NOT DETECTED
Benzodiazepines: NOT DETECTED
Cocaine: NOT DETECTED
Opiates: POSITIVE — AB
Tetrahydrocannabinol: POSITIVE — AB

## 2020-01-07 LAB — COMPREHENSIVE METABOLIC PANEL
ALT: 13 U/L (ref 0–44)
AST: 18 U/L (ref 15–41)
Albumin: 4.1 g/dL (ref 3.5–5.0)
Alkaline Phosphatase: 67 U/L (ref 38–126)
Anion gap: 13 (ref 5–15)
BUN: 17 mg/dL (ref 8–23)
CO2: 24 mmol/L (ref 22–32)
Calcium: 9.3 mg/dL (ref 8.9–10.3)
Chloride: 104 mmol/L (ref 98–111)
Creatinine, Ser: 0.66 mg/dL (ref 0.44–1.00)
GFR calc Af Amer: >60 mL/min (ref >60)
GFR calc non Af Amer: >60 mL/min (ref >60)
Glucose, Bld: 100 mg/dL — ABNORMAL HIGH (ref 70–99)
Potassium: 3.7 mmol/L (ref 3.5–5.1)
Sodium: 141 mmol/L (ref 135–145)
Total Bilirubin: 0.7 mg/dL (ref 0.3–1.2)
Total Protein: 6.7 g/dL (ref 6.5–8.1)

## 2020-01-07 LAB — HIV ANTIBODY (ROUTINE TESTING W REFLEX): HIV Screen 4th Generation wRfx: NONREACTIVE

## 2020-01-07 LAB — MAGNESIUM: Magnesium: 1.7 mg/dL (ref 1.7–2.4)

## 2020-01-07 LAB — LIPASE, BLOOD: Lipase: 18 U/L (ref 11–51)

## 2020-01-07 MED ORDER — ENOXAPARIN SODIUM 40 MG/0.4ML ~~LOC~~ SOLN
40.0000 mg | SUBCUTANEOUS | Status: DC
  Administered 2020-01-07 – 2020-01-08 (×2): 40 mg via SUBCUTANEOUS
  Filled 2020-01-07: qty 0.4

## 2020-01-07 MED ORDER — ASPIRIN EC 325 MG PO TBEC
325.0000 mg | DELAYED_RELEASE_TABLET | Freq: Every day | ORAL | Status: DC
  Administered 2020-01-07 – 2020-01-08 (×2): 325 mg via ORAL
  Filled 2020-01-07 (×2): qty 1

## 2020-01-07 MED ORDER — EZETIMIBE 10 MG PO TABS
10.0000 mg | ORAL_TABLET | Freq: Every day | ORAL | Status: DC
  Administered 2020-01-07: 10 mg via ORAL
  Filled 2020-01-07: qty 1

## 2020-01-07 MED ORDER — SIMVASTATIN 20 MG PO TABS
20.0000 mg | ORAL_TABLET | Freq: Every day | ORAL | Status: DC
  Administered 2020-01-07: 20 mg via ORAL
  Filled 2020-01-07: qty 1

## 2020-01-07 MED ORDER — SUCRALFATE 1 G PO TABS
1.0000 g | ORAL_TABLET | Freq: Three times a day (TID) | ORAL | Status: DC
  Administered 2020-01-07 – 2020-01-08 (×4): 1 g via ORAL
  Filled 2020-01-07 (×5): qty 1

## 2020-01-07 MED ORDER — TOPIRAMATE 25 MG PO TABS
25.0000 mg | ORAL_TABLET | Freq: Every day | ORAL | Status: DC
  Administered 2020-01-07: 25 mg via ORAL
  Filled 2020-01-07 (×2): qty 1

## 2020-01-07 MED ORDER — GABAPENTIN 300 MG PO CAPS: 900.0000 mg | ORAL_CAPSULE | Freq: Three times a day (TID) | ORAL | Status: DC | PRN

## 2020-01-07 MED ORDER — ACETAMINOPHEN 325 MG PO TABS: 650.0000 mg | ORAL_TABLET | ORAL | Status: DC | PRN

## 2020-01-07 MED ORDER — PERPHENAZINE 4 MG PO TABS
4.0000 mg | ORAL_TABLET | Freq: Three times a day (TID) | ORAL | Status: DC
  Administered 2020-01-07 – 2020-01-08 (×4): 4 mg via ORAL
  Filled 2020-01-07 (×8): qty 1

## 2020-01-07 MED ORDER — ONDANSETRON HCL 4 MG/2ML IJ SOLN
4.0000 mg | Freq: Four times a day (QID) | INTRAMUSCULAR | Status: DC | PRN
  Administered 2020-01-07 – 2020-01-08 (×2): 4 mg via INTRAVENOUS
  Filled 2020-01-07 (×2): qty 2

## 2020-01-07 MED ORDER — TIZANIDINE HCL 4 MG PO TABS
4.0000 mg | ORAL_TABLET | Freq: Three times a day (TID) | ORAL | Status: DC
  Administered 2020-01-07 – 2020-01-08 (×4): 4 mg via ORAL
  Filled 2020-01-07 (×6): qty 1

## 2020-01-07 MED ORDER — POTASSIUM CHLORIDE 10 MEQ/100ML IV SOLN
10.0000 meq | INTRAVENOUS | Status: AC
  Administered 2020-01-07 (×2): 10 meq via INTRAVENOUS
  Filled 2020-01-07 (×2): qty 100

## 2020-01-07 MED ORDER — HYDROCODONE-ACETAMINOPHEN 5-325 MG PO TABS
1.0000 | ORAL_TABLET | Freq: Four times a day (QID) | ORAL | Status: DC | PRN
  Administered 2020-01-07 – 2020-01-08 (×3): 1 via ORAL
  Filled 2020-01-07 (×3): qty 1

## 2020-01-07 MED ORDER — NITROGLYCERIN 0.4 MG SL SUBL
0.4000 mg | SUBLINGUAL_TABLET | SUBLINGUAL | Status: DC | PRN
  Administered 2020-01-07 (×2): 0.4 mg via SUBLINGUAL
  Filled 2020-01-07 (×2): qty 1

## 2020-01-07 MED ORDER — SODIUM CHLORIDE 0.9% FLUSH: 10.0000 mL | INTRAVENOUS | Status: DC | PRN

## 2020-01-07 MED ORDER — NITROGLYCERIN 0.4 MG SL SUBL
SUBLINGUAL_TABLET | SUBLINGUAL | Status: AC
  Filled 2020-01-07: qty 2

## 2020-01-07 MED ORDER — CYCLOBENZAPRINE HCL 10 MG PO TABS: 10.0000 mg | ORAL_TABLET | Freq: Three times a day (TID) | ORAL | Status: DC | PRN

## 2020-01-07 MED ORDER — POTASSIUM CHLORIDE CRYS ER 20 MEQ PO TBCR
40.0000 meq | EXTENDED_RELEASE_TABLET | Freq: Once | ORAL | Status: AC
  Administered 2020-01-07: 40 meq via ORAL
  Filled 2020-01-07: qty 2

## 2020-01-07 MED ORDER — EZETIMIBE-SIMVASTATIN 10-20 MG PO TABS
1.0000 | ORAL_TABLET | Freq: Every day | ORAL | Status: DC
  Filled 2020-01-07 (×2): qty 1

## 2020-01-07 MED ORDER — VENLAFAXINE HCL ER 75 MG PO CP24
75.0000 mg | ORAL_CAPSULE | Freq: Every day | ORAL | Status: DC
  Administered 2020-01-07: 75 mg via ORAL
  Filled 2020-01-07 (×3): qty 1

## 2020-01-07 MED ORDER — PANTOPRAZOLE SODIUM 40 MG PO TBEC
40.0000 mg | DELAYED_RELEASE_TABLET | Freq: Every day | ORAL | Status: DC
  Administered 2020-01-07 – 2020-01-08 (×2): 40 mg via ORAL
  Filled 2020-01-07 (×2): qty 1

## 2020-01-07 MED ORDER — SODIUM CHLORIDE 0.9% FLUSH
10.0000 mL | Freq: Two times a day (BID) | INTRAVENOUS | Status: DC
  Administered 2020-01-07 (×2): 10 mL

## 2020-01-07 MED ORDER — IVABRADINE HCL 5 MG PO TABS
10.0000 mg | ORAL_TABLET | Freq: Once | ORAL | Status: AC
  Administered 2020-01-07: 10 mg via ORAL
  Filled 2020-01-07: qty 2

## 2020-01-07 MED ORDER — LAMOTRIGINE 100 MG PO TABS
150.0000 mg | ORAL_TABLET | Freq: Every day | ORAL | Status: DC
  Administered 2020-01-07: 150 mg via ORAL
  Filled 2020-01-07: qty 2
  Filled 2020-01-07: qty 1

## 2020-01-07 MED ORDER — DICYCLOMINE HCL 20 MG PO TABS
20.0000 mg | ORAL_TABLET | Freq: Two times a day (BID) | ORAL | Status: DC
  Filled 2020-01-07 (×3): qty 1

## 2020-01-07 MED ORDER — IOHEXOL 350 MG/ML SOLN
80.0000 mL | Freq: Once | INTRAVENOUS | Status: AC | PRN
  Administered 2020-01-07: 80 mL via INTRAVENOUS

## 2020-01-07 NOTE — ED Notes (Signed)
Lunch Tray Ordered @ 1038.  

## 2020-01-07 NOTE — ED Notes (Signed)
Attempted report to 6E x1.  

## 2020-01-07 NOTE — ED Notes (Signed)
Cardiology at bedside.

## 2020-01-07 NOTE — Progress Notes (Signed)
Patient transferred from ED at 1452 hrs.  Oriented to unit and plan of care for shift.  Patient verbalized understanding.

## 2020-01-07 NOTE — Consult Note (Addendum)
Cardiology Consultation:   Patient ID: [name] MRN: [id number]; DOB: [birth year]  Admit date: 01/06/2020 Date of Consult: 01/07/2020  Primary Care Provider: Rocco Serene, MD Primary Cardiologist: Quay Burow, MD  Primary Electrophysiologist:  None    Patient Profile:   [name] is a 64 y.o. female with a hx of neck and back pain, prior stroke, psychiatric issues, HLD, and previous marijuana use who is being seen today for the evaluation of chest pain at the request of Dr. Lorin Mercy.  History of Present Illness:   [name] has been seen in the past by Dr. Gwenlyn Found. She was recently referred to cardiology for atypical chest pain first seen 12/03/19. She denied histroy of MI but did have RF including HLD and family history positive for an aunt who died in her 66s. She described exertional CP for the last 6 months. A coronary CTA and echo were ordered. Echo showed normal LV function, moderate asymmetrical septal hypertrophy consistent with outflow tract gradient of 66 mmHg; basal septum was thicker compared to echo in 2015. Dr. Gwenlyn Found planned to further assess with cardiac MRI. Patient was scheduled for Cardiac CTA on March 16th. She had gone for the test earlier but had some hypotension so it was re-scheduled.  The patient presented to the ED 01/07/20 for chest pain. She reported 3 days of lower left CP described as a pressure. Radiating up into upper chest.  Pain was 10/10. Associated sob and dizziness. No N/V, diaphoresis. Symptoms worse with exertion and movement. Took aspirin for symptoms. EMS was called but was not given NTG. In the ED BP 124/66, pulse 79, afebrile, RR 16, 98% O2. Was given NTG in the ED for persistent chest pain and it helped the paikn. Labs showed K+ 2.9 and was given oral and IV replacement. Other labs showed glucose 129, Magnesium 1.7. LFTS wnl. WBC 11.2, Hgb 13.9. HS troponin 13 > 12. CXR unremarkable. EKG showed NSR with minimal STE V2 and V3 with ST depression  lateral leads, Q waves septal leads. The patient was admitted for further work-up and cardiology was consulted.    Heart Pathway Score:     Past Medical History:  Diagnosis Date  . Abdominal pain    chronic, recurrent  . Allergy   . Bipolar affective disorder ([place])   . Chronic headaches   . Chronic pain   . Colon polyps    adenomatous  . Cyclical vomiting syndrome   . Depression   . Diverticulosis   . Endometriosis   . Fibrocystic breast   . GERD (gastroesophageal reflux disease)   . Hiatal hernia   . IBS (irritable bowel syndrome)   . Marijuana dependence ([place])   . Status post repair of paraesophageal diaphragmatic hernia   . Stroke [facility]) 12/2013    Past Surgical History:  Procedure Laterality Date  . ABDOMINAL HYSTERECTOMY  1990  . BREAST LUMPECTOMY Bilateral   . FOOT FRACTURE SURGERY Right    x 3  . HIATAL HERNIA REPAIR    . NISSEN FUNDOPLICATION  123456   hiatal hernia repair  . TOE SURGERY Left      Home Medications:  Prior to Admission medications   Medication Sig Start Date End Date Taking? Authorizing Provider  aspirin EC 325 MG tablet Take 325 mg by mouth daily.   Yes [provider]  cyclobenzaprine (FLEXERIL) 10 MG tablet Take 10 mg by mouth 3 (three) times daily as needed for muscle spasms.   Yes [provider]  diclofenac sodium (VOLTAREN) 1 % GEL Apply 1 g topically 3 (three) times daily as needed (back and elbow pain).    Yes [provider]  dicyclomine (BENTYL) 20 MG tablet Take 1 tablet (20 mg total) by mouth 2 (two) times daily. 10/22/19  Yes Lacretia Leigh, MD  ezetimibe-simvastatin (VYTORIN) 10-20 MG tablet Take 1 tablet by mouth at bedtime. 12/11/19 12/10/20 Yes Garvin Fila, MD  furosemide (LASIX) 40 MG tablet Take 40 mg by mouth as needed for fluid.  12/13/18  Yes [provider]  gabapentin (NEURONTIN) 300 MG capsule Take 900 mg by mouth 3 (three) times daily as needed (nerve pain).  04/23/15  Yes [provider]  HYDROcodone-acetaminophen (NORCO/VICODIN) 5-325 MG tablet Take 1 tablet by mouth 4 (four) times daily as needed for moderate pain.  11/29/19  Yes [provider]  lamoTRIgine (LAMICTAL) 150 MG tablet Take 150 mg by mouth at bedtime.    Yes [provider]  ondansetron (ZOFRAN ODT) 4 MG disintegrating tablet Take 1 tablet (4 mg total) by mouth every 8 (eight) hours as needed for nausea. 04/07/17  Yes Larene Pickett, PA-C  pantoprazole (PROTONIX) 40 MG tablet Take 1 tablet (40 mg total) by mouth daily. 12/14/15  Yes Irene Shipper, MD  perphenazine (TRILAFON) 4 MG tablet Take 4 mg by mouth 3 (three) times daily.    Yes [provider]  potassium chloride SA (K-DUR,KLOR-CON) 20 MEQ tablet Take 2 tablets (40 mEq total) by mouth 2 (two) times daily for 10 days. 02/08/19 11/20/28 Yes Kinnie Feil, PA-C  promethazine (PHENERGAN) 25 MG suppository INSERT 1 SUPPOSITORY INTO RECTUM EVERY 8 HOURS AS NEEDED FOR NAUSEA OR VOMITING Patient taking differently: Place 25 mg rectally every 8 (eight) hours as needed for nausea or vomiting.  12/14/15  Yes Irene Shipper, MD  sucralfate (CARAFATE) 1 g tablet Take 1 g by mouth 4 (four) times daily -  with meals and at bedtime.   Yes [provider]  tiZANidine (ZANAFLEX) 4 MG tablet Take 4 mg by mouth 3 (three) times daily. 12/13/18  Yes [provider]  topiramate (TOPAMAX) 25 MG tablet Take 1 tablet (25 mg total) by mouth at bedtime. 12/11/19 12/10/20 Yes Garvin Fila, MD  venlafaxine XR (EFFEXOR-XR) 75 MG 24 hr capsule Take 75 mg by mouth at bedtime.    Yes [provider]    Inpatient Medications: Scheduled Meds: . aspirin EC  325 mg Oral Daily  . dicyclomine  20 mg Oral BID  . enoxaparin (LOVENOX) injection  40 mg Subcutaneous Q24H  . ezetimibe-simvastatin  1 tablet Oral QHS  . lamoTRIgine  150 mg Oral QHS  . pantoprazole  40 mg Oral Daily  . perphenazine  4 mg Oral TID  . sucralfate  1 g  Oral TID WC & HS  . tiZANidine  4 mg Oral TID  . topiramate  25 mg Oral QHS  . venlafaxine XR  75 mg Oral QHS   Continuous Infusions:  PRN Meds: acetaminophen, cyclobenzaprine, gabapentin, HYDROcodone-acetaminophen, ondansetron (ZOFRAN) IV  Allergies:    Allergies  Allergen Reactions  . Dust Mite Extract   . Lyrica [Pregabalin]     Cause bladder issues     Social History:   Social History   Socioeconomic History  . Marital status: Married    Spouse name: Not on file  . Number of children: 3  . Years of education: Not on file  .  Highest education level: Not on file  Occupational History  . Occupation: disabled  Tobacco Use  . Smoking status: Never Smoker  . Smokeless tobacco: Never Used  Substance and Sexual Activity  . Alcohol use: No    Alcohol/week: 0.0 standard drinks  . Drug use: Yes    Types: Marijuana    Comment: smokes daily   . Sexual activity: Not on file  Other Topics Concern  . Not on file  Social History Narrative  . Not on file   Social Determinants of Health   Financial Resource Strain:   . Difficulty of Paying Living Expenses: Not on file  Food Insecurity:   . Worried About Charity fundraiser in the Last Year: Not on file  . Ran Out of Food in the Last Year: Not on file  Transportation Needs:   . Lack of Transportation (Medical): Not on file  . Lack of Transportation (Non-Medical): Not on file  Physical Activity:   . Days of Exercise per Week: Not on file  . Minutes of Exercise per Session: Not on file  Stress:   . Feeling of Stress : Not on file  Social Connections:   . Frequency of Communication with Friends and Family: Not on file  . Frequency of Social Gatherings with Friends and Family: Not on file  . Attends Religious Services: Not on file  . Active Member of Clubs or Organizations: Not on file  . Attends Archivist Meetings: Not on file  . Marital Status: Not on file  Intimate Partner Violence:   . Fear of Current or  Ex-Partner: Not on file  . Emotionally Abused: Not on file  . Physically Abused: Not on file  . Sexually Abused: Not on file    Family History:   Family History  Problem Relation Age of Onset  . Brain cancer Mother   . Diabetes Mother   . Breast cancer Sister   . Breast cancer Paternal Grandmother   . Diabetes Sister        x 2  . Colon cancer Neg Hx      ROS:  Please see the history of present illness.  All other ROS reviewed and negative.     Physical Exam/Data:   Vitals:   01/07/20 0730 01/07/20 0800 01/07/20 0900 01/07/20 1000  BP:  110/70 118/83 118/70  Pulse: 69 70 78 86  Resp: 13 11 12 20   Temp:      TempSrc:      SpO2: 96% 98% 97% 98%  Weight:      Height:        Intake/Output Summary (Last 24 hours) at 01/07/2020 1116 Last data filed at 01/07/2020 0345 Gross per 24 hour  Intake 100 ml  Output --  Net 100 ml   Last 3 Weights 01/06/2020 12/27/2019 12/25/2019  Weight (lbs) 135 lb 136 lb 3.2 oz 138 lb  Weight (kg) 61.236 kg 61.78 kg 62.596 kg     Body mass index is 21.79 kg/m.  General:  Well nourished, well developed, in no acute distress HEENT: normal Lymph: no adenopathy Neck: no JVD Endocrine:  No thryomegaly Vascular: No carotid bruits; FA pulses 2+ bilaterally without bruits  Cardiac:  normal S1, S2; RRR; no murmur  Lungs:  clear to auscultation bilaterally, no wheezing, rhonchi or rales  Abd: soft, nontender, no hepatomegaly  Ext: no edema Musculoskeletal:  No deformities, BUE and BLE strength normal and equal Skin: warm and dry  Neuro:  CNs  2-12 intact, no focal abnormalities noted Psych:  Normal affect   EKG:  The EKG was personally reviewed and demonstrates:  EKG showed NSR with minimal STE V2 and V3 with ST depression lateral leads, Q waves septal leads. Telemetry:  Telemetry was personally reviewed and demonstrates:    Relevant CV Studies:  Echo 12/13/19 1. Left ventricular ejection fraction, by estimation, is 65 to 70%. The  left  ventricle has normal function. The left ventricle has no regional  wall motion abnormalities. There is mildly increased left ventricular  hypertrophy with moderate asymmetric  basal septal hypertrophy (14 mm). Left ventricular diastolic parameters  were normal. The average left ventricular global longitudinal strain is  -22.6 %. Severe LVOT obstruction with maximal instantaneous gradient 66  mmHg (Vmax 4 m/s). Hypertrophied  papillary muscle may contribute to obstruction in hyperdynamic ventricle.  No definite systolic anterior motion of the mitral valve, mild chordal  SAM. Consider cardiac MRI if clinically indicated to evaluate for  hypertrophic cardiomyopathy.  2. Right ventricular systolic function is normal. The right ventricular  size is normal. Tricuspid regurgitation signal is inadequate for assessing  PA pressure.  3. The mitral valve is normal in structure and function. Trivial mitral  valve regurgitation.  4. The aortic valve is tricuspid. Aortic valve regurgitation is not  visualized. No aortic stenosis is present.  5. The inferior vena cava is normal in size with greater than 50%  respiratory variability, suggesting right atrial pressure of 3 mmHg.   Laboratory Data:  High Sensitivity Troponin:   Recent Labs  Lab 01/06/20 1916 01/06/20 2141  TROPONINIHS 13 12     Chemistry Recent Labs  Lab 01/06/20 1916 01/07/20 0855  NA 134* 141  K 2.9* 3.7  CL 99 104  CO2 21* 24  GLUCOSE 129* 100*  BUN 23 17  CREATININE 0.85 0.66  CALCIUM 9.2 9.3  GFRNONAA >60 >60  GFRAA >60 >60  ANIONGAP 14 13    Recent Labs  Lab 01/07/20 0855  PROT 6.7  ALBUMIN 4.1  AST 18  ALT 13  ALKPHOS 67  BILITOT 0.7   Hematology Recent Labs  Lab 01/06/20 1916  WBC 11.2*  RBC 4.11  HGB 13.9  HCT 40.3  MCV 98.1  MCH 33.8  MCHC 34.5  RDW 11.9  PLT 278   BNPNo results for input(s): BNP, PROBNP in the last 168 hours.  DDimer No results for input(s): DDIMER in the last 168  hours.   Radiology/Studies:  DG Chest 2 View  Result Date: 01/06/2020 CLINICAL DATA:  Chest pain EXAM: CHEST - 2 VIEW COMPARISON:  11/19/2019 FINDINGS: The heart size and mediastinal contours are within normal limits. Both lungs are clear. The visualized skeletal structures are unremarkable. IMPRESSION: No active cardiopulmonary disease. Electronically Signed   By: Constance Holster M.D.   On: 01/06/2020 19:29    HEAR Score (for undifferentiated chest pain):  HEAR Score: 5    Assessment and Plan:   Chest pain Patient with HLD and +family history who recently established with Dr. Gwenlyn Found. Echo showed normal LV function, no RWMA, basal septal hypertrophy, LVOT obstruction. Also patient was scheduld for Cardiac CT. Presents with the same exertional CP improved with NTG. HS troponin 13>12. EKG with some ST changes and septal Q waves.  - Would order Cardiac CT during admission. Will given Ivabradine 10 mg before test. - Patient pain has improved but still has mild pain. - Aspirin 325mg  daily for prior stroke - will risk stratify with  lipid panel and A1C - further recs per cardiac CT.  - Will wait on Cardiac MRI  Prior stroke - Asprin 325 mg daily  HLD - Vytorin at baseline - check lipid profile  LLE - Patient reported lower leg swelling on Sunday improved with oral lasix - continue to monitor, appears euvolemic on exam  For questions or updates, please contact [place] Please consult [email or web address] for contact info under   Signed, Cadence Ninfa Meeker, PA-C  01/07/2020 11:16 AM   The patient was seen, examined and discussed with Cadence Ninfa Meeker, PA-C   and I agree with the above.   64 y.o. female, patient of Dr. Gwenlyn Found, with chronic neck and back pain,, chest pain, prior stroke, psychiatric issues, HLD, and previous marijuana use who is being seen today for the evaluation of chest pain. She was recently referred to cardiology for atypical chest pain first seen on 12/03/19.  She has no  prior history of heart disease, but has family history of premature coronary artery disease with and died in her 52s of myocardial infarction.   The patient has been having on and off chest pain for the last 6 months exertional and nonexertional, associated with shortness of breath, not relieved by nitroglycerin, her current chest pain has been ongoing for last 3 days.  She was scheduled to undergo coronary CTA however was hypotensive and feels rescheduled. She also had an echocardiogram that showed moderate asymmetrical septal hypertrophy consistent with outflow tract gradient of 66 mmHg; basal septum was thicker compared to echo in 2015. Dr. Gwenlyn Found planned to further assess with cardiac MRI to evaluate for possible hypertrophic cardiomyopathy.  On physical exam she is not in acute distress, she has no carotid bruits, S1-S2 with 3 out of 6 systolic murmur, her lungs are clear and she has no lower extremity edema and good pulses.  Her EKG shows sinus rhythm, LVH, nonspecific ST-T wave abnormalities , this is unchanged from prior.  Her labs are unremarkable with normal electrolytes, kidney function, hemoglobin and 2 troponins are negative.  Given her hypotension and heart rate in 80s we will give her ivabradine 10 mg x 1 and scan her 2 hours later, she already has 18-gauge IV line, will plan for coronary CTA while she is in ER.  I have reviewed her echocardiogram and she has hyperdynamic LVEF 65 to 70% with moderate basal septal hypertrophy, there is an LVOT gradient of 21 mmHg and increases to 68 with Valsalva maneuvers. Her CT shows moderate concentric LVH and systolic anterior motion of the mitral valve leaflet, will order cardiac MRI.  Ena Dawley, MD 01/07/2020

## 2020-01-07 NOTE — H&P (Signed)
History and Physical    [name] [id number] DOB: October 18, 1956 DOA: 01/06/2020  PCP: Rocco Serene, MD Consultants:  [name] - cardiology; [name] - GILeonie Griffith - neurology Patient coming from:  Home - lives with husband; NOK: [name], [name], [phone].  She asks that we speak with her sister, Hillis Range, at this time, [phone], because her husband is struggling right now.  Chief Complaint: Chest pain  HPI: [name] is a 64 y.o. female with medical history significant of CVA; marijuana dependence; IBS: bipolar; and chronic pain presenting with chest pain.  She was last seen by Dr. Gwenlyn Griffith on 2/26 re: echo from 2/12 which showed moderate asymmetric septal hypertrophy with significant outflow tract gradient.  She is scheduled for a cardiac CTA and also cardiac MRI.  She called his office yesterday; they recommended that she come to the ER for further testing.  For the last 3 days, she has been having CP, SOB (particularly worse with conversation and exertion).  When moving around, she notices numbness and tingling in her face and hands and leg swelling and her vision "was kind of squiggly" with symptoms similar to prior CVA.  CP actually appears to be in LUQ with pressure up into left chest and axilla.  No n/v.   ED Course:  Carryover, per Dr. Marlowe Sax:  Patient here with complaints of chest pain which appears atypical but does have risk factors for CAD.  Troponin x2 - and EKG negative. Had recent outpatient echo done by cardiology which was abnormal. Plan was to do cardiac MRI and CTA at that time., Still pending. ED provider discussed the case with Dr. Kalman Shan from cardiology who recommended admission for observation and doing cardiac CTA in the morning.   Also complaining of bilateral facial numbness, left forearm numbness, and right wrist numbness. ED provider not concerned about acute stroke and further work-up was not pursued.   Review of Systems: As per HPI;  otherwise review of systems reviewed and negative.   Ambulatory Status:  Ambulates without assistance  Past Medical History:  Diagnosis Date  . Abdominal pain    chronic, recurrent  . Allergy   . Bipolar affective disorder ([place])   . Chronic headaches   . Chronic pain   . Colon polyps    adenomatous  . Cyclical vomiting syndrome   . Depression   . Diverticulosis   . Endometriosis   . Fibrocystic breast   . GERD (gastroesophageal reflux disease)   . Hiatal hernia   . IBS (irritable bowel syndrome)   . Marijuana dependence ([place])   . Status post repair of paraesophageal diaphragmatic hernia   . Stroke [facility]) 12/2013    Past Surgical History:  Procedure Laterality Date  . ABDOMINAL HYSTERECTOMY  1990  . BREAST LUMPECTOMY Bilateral   . FOOT FRACTURE SURGERY Right    x 3  . HIATAL HERNIA REPAIR    . NISSEN FUNDOPLICATION  123456   hiatal hernia repair  . TOE SURGERY Left     Social History   Socioeconomic History  . Marital status: Married    Spouse name: Not on file  . Number of children: 3  . Years of education: Not on file  . Highest education level: Not on file  Occupational History  . Occupation: disabled  Tobacco Use  . Smoking status: Never Smoker  . Smokeless tobacco: Never Used  Substance and Sexual Activity  . Alcohol use: No    Alcohol/week: 0.0 standard drinks  .  Drug use: Yes    Types: Marijuana    Comment: smokes daily   . Sexual activity: Not on file  Other Topics Concern  . Not on file  Social History Narrative  . Not on file   Social Determinants of Health   Financial Resource Strain:   . Difficulty of Paying Living Expenses: Not on file  Food Insecurity:   . Worried About Charity fundraiser in the Last Year: Not on file  . Ran Out of Food in the Last Year: Not on file  Transportation Needs:   . Lack of Transportation (Medical): Not on file  . Lack of Transportation (Non-Medical): Not on file  Physical Activity:   . Days of Exercise  per Week: Not on file  . Minutes of Exercise per Session: Not on file  Stress:   . Feeling of Stress : Not on file  Social Connections:   . Frequency of Communication with Friends and Family: Not on file  . Frequency of Social Gatherings with Friends and Family: Not on file  . Attends Religious Services: Not on file  . Active Member of Clubs or Organizations: Not on file  . Attends Archivist Meetings: Not on file  . Marital Status: Not on file  Intimate Partner Violence:   . Fear of Current or Ex-Partner: Not on file  . Emotionally Abused: Not on file  . Physically Abused: Not on file  . Sexually Abused: Not on file    Allergies  Allergen Reactions  . Dust Mite Extract   . Lyrica [Pregabalin]     Cause bladder issues     Family History  Problem Relation Age of Onset  . Brain cancer Mother   . Diabetes Mother   . Breast cancer Sister   . Breast cancer Paternal Grandmother   . Diabetes Sister        x 2  . Colon cancer Neg Hx     Prior to Admission medications   Medication Sig Start Date End Date Taking? Authorizing Provider  aspirin EC 325 MG tablet Take 325 mg by mouth daily.   Yes [provider]  cyclobenzaprine (FLEXERIL) 10 MG tablet Take 10 mg by mouth 3 (three) times daily as needed for muscle spasms.   Yes [provider]  diclofenac sodium (VOLTAREN) 1 % GEL Apply 1 g topically 3 (three) times daily as needed (back and elbow pain).    Yes [provider]  dicyclomine (BENTYL) 20 MG tablet Take 1 tablet (20 mg total) by mouth 2 (two) times daily. 10/22/19  Yes Lacretia Leigh, MD  ezetimibe-simvastatin (VYTORIN) 10-20 MG tablet Take 1 tablet by mouth at bedtime. 12/11/19 12/10/20 Yes Garvin Fila, MD  furosemide (LASIX) 40 MG tablet Take 40 mg by mouth as needed for fluid.  12/13/18  Yes [provider]  gabapentin (NEURONTIN) 300 MG capsule Take 900 mg by mouth 3 (three) times daily as needed (nerve pain).  04/23/15  Yes  [provider]  HYDROcodone-acetaminophen (NORCO/VICODIN) 5-325 MG tablet Take 1 tablet by mouth 4 (four) times daily as needed for moderate pain.  11/29/19  Yes [provider]  lamoTRIgine (LAMICTAL) 150 MG tablet Take 150 mg by mouth at bedtime.    Yes [provider]  ondansetron (ZOFRAN ODT) 4 MG disintegrating tablet Take 1 tablet (4 mg total) by mouth every 8 (eight) hours as needed for nausea. 04/07/17  Yes Larene Pickett, PA-C  pantoprazole (PROTONIX) 40 MG  tablet Take 1 tablet (40 mg total) by mouth daily. 12/14/15  Yes Irene Shipper, MD  perphenazine (TRILAFON) 4 MG tablet Take 4 mg by mouth 3 (three) times daily.    Yes [provider]  potassium chloride SA (K-DUR,KLOR-CON) 20 MEQ tablet Take 2 tablets (40 mEq total) by mouth 2 (two) times daily for 10 days. 02/08/19 11/20/28 Yes Kinnie Feil, PA-C  promethazine (PHENERGAN) 25 MG suppository INSERT 1 SUPPOSITORY INTO RECTUM EVERY 8 HOURS AS NEEDED FOR NAUSEA OR VOMITING Patient taking differently: Place 25 mg rectally every 8 (eight) hours as needed for nausea or vomiting.  12/14/15  Yes Irene Shipper, MD  sucralfate (CARAFATE) 1 g tablet Take 1 g by mouth 4 (four) times daily -  with meals and at bedtime.   Yes [provider]  tiZANidine (ZANAFLEX) 4 MG tablet Take 4 mg by mouth 3 (three) times daily. 12/13/18  Yes [provider]  topiramate (TOPAMAX) 25 MG tablet Take 1 tablet (25 mg total) by mouth at bedtime. 12/11/19 12/10/20 Yes Garvin Fila, MD  venlafaxine XR (EFFEXOR-XR) 75 MG 24 hr capsule Take 75 mg by mouth at bedtime.    Yes [provider]    Physical Exam: Vitals:   01/07/20 0900 01/07/20 1000 01/07/20 1115 01/07/20 1200  BP: 118/83 118/70  107/84  Pulse: 78 86 71 72  Resp: 12 20 (!) 23 (!) 21  Temp:      TempSrc:      SpO2: 97% 98% 96% 96%  Weight:      Height:         . General:  Appears calm and comfortable and is NAD; mildly  disheveled . Eyes:  PERRL, EOMI, normal lids, iris . ENT:  grossly normal hearing, lips & tongue, mmm; suboptimal dentition . Neck:  no LAD, masses or thyromegaly . Cardiovascular:  RRR, no m/r/g. No LE edema.  Marland Kitchen Respiratory:   CTA bilaterally with no wheezes/rales/rhonchi.  Normal respiratory effort. . Abdomen:  soft, NT, ND, NABS . Back:   normal alignment, no CVAT . Skin:  no rash or induration seen on limited exam . Musculoskeletal:  grossly normal tone BUE/BLE, good ROM, no bony abnormality . Psychiatric:  grossly normal mood and affect, speech fluent and appropriate, AOx3 . Neurologic:  CN 2-12 grossly intact, moves all extremities in coordinated fashion    Radiological Exams on Admission: DG Chest 2 View  Result Date: 01/06/2020 CLINICAL DATA:  Chest pain EXAM: CHEST - 2 VIEW COMPARISON:  11/19/2019 FINDINGS: The heart size and mediastinal contours are within normal limits. Both lungs are clear. The visualized skeletal structures are unremarkable. IMPRESSION: No active cardiopulmonary disease. Electronically Signed   By: Constance Holster M.D.   On: 01/06/2020 19:29    EKG: Independently reviewed.  NSR with rate 82;  ST changes c/w septal infarct but NSCSLT   Labs on Admission: I have personally reviewed the available labs and imaging studies at the time of the admission.  Pertinent labs:   Na++ 134 K+ 2.9 Glucose 129 HS troponin 13, 12 WBC 11.2 COVID negative   Assessment/Plan Principal Problem:   Atypical chest pain Active Problems:   Bipolar affective disorder (HCC)   Polysubstance abuse (HCC)   Hyperlipidemia   Chronic pain   Chest pain -Patient with left-sided chest pain with SOB -This would not be so concerning but she has recently seen Dr. Gwenlyn Griffith and had an abnormal echo with moderate septal hypertrophy and significant outflow  tract gradient; she was recommended to have both a cardiac CTA and MRI -CXR unremarkable.   -HS troponin negative x 2 -EKG not  indicative of acute ischemia but with possible septal ischemia of uncertain duration   -Will plan to place in observation status on telemetry to rule out ACS by overnight observation.  -Continue ASA 325 mg daily -Risk factor stratification with HgbA1c and FLP; will also check TSH and UDS -Cardiology consultation   HLD -Lipids were checked in 12/2019 (TC 170, HDL 62, LDL 85, TG 131) so will not repeat at this time -Continue Vytorin for now -Consider changing to Lipitor instead  H/o CVA -Current symptoms are not overly concerning for CVA -Will follow  Marijuana dependence -acknowledges marijuana use but denies other drugs -Prior UDS positive for a variety of substances intermittently and marijuana regularly -Cessation encouraged; this should be encouraged on an ongoing basis -UDS ordered  Chronic pain -Continue Zanaflex, Neurontin, Norco -She appears to have been taking Zanaflex TID standing and also Flexeril prn; will hold Flexeril  Bipolar -Continue Lamictal, Trilafon, Effexor, Topamax  Recurrent n/v -Continue Carafate, Protonix, Bentyl   Note: This patient has been tested and is negative for the novel coronavirus COVID-19.    DVT prophylaxis: Lovenox  Code Status:  Full - confirmed with patient Family Communication: None present; I was unable to reach her sister at the time of admission and I left a message with my callback number Disposition Plan:  She is anticipated to d/c to home without [facility] services once her cardiology issues have been resolved. Consults called: Cardiology  Admission status: It is my clinical opinion that referral for OBSERVATION is reasonable and necessary in this patient based on the above information provided. The aforementioned taken together are felt to place the patient at high risk for further clinical deterioration. However it is anticipated that the patient may be medically stable for discharge from the hospital within 24 to 48 hours.     Karmen Bongo MD [facility]   How to contact the [facility] Attending or Consulting provider [place] or covering provider during after hours [place], for this patient?  1. Check the care team in [facility] and look for a) attending/consulting [facility] provider listed and b) the [facility] team listed 2. Log into [email or web address] and use [place]'s universal password to access. If you do not have the password, please contact the hospital operator. 3. Locate the [facility] provider you are looking for under [facility] and page to a number that you can be directly reached. 4. If you still have difficulty reaching the provider, please page the [facility] (Director on Call) for the Hospitalists listed on amion for assistance.   01/07/2020, 1:30 PM

## 2020-01-07 NOTE — ED Notes (Signed)
Family at bedside. 

## 2020-01-07 NOTE — ED Provider Notes (Signed)
TIME SEEN: 12:43 AM  CHIEF COMPLAINT: Chest pain  HPI: Patient is a 64 year old female with history of bipolar disorder, marijuana dependence, hyperlipidemia who presents to the emergency department with 3 days of left lower chest pain that she describes as a pressure without radiation.  Describes associated shortness of breath, dizziness.  No nausea, vomiting or diaphoresis.  Symptoms worse with exertion.  Pain has been ongoing.  Took aspirin prior to arrival.  Arrives with EMS.  Was not given any nitroglycerin in route.  Reports family history of an aunt who had an MI in her 29s.  States her father died at 18 as he was killed but denies that he ever had a history of heart disease despite previous notes.  Denies fevers, cough.    Also reports numbness in both sides of her face and in both arms over the past several days.  No weakness.  No speech or vision changes.  No headache or head injury.  ROS: See HPI Constitutional: no fever  Eyes: no drainage  ENT: no runny nose   Cardiovascular:   chest pain  Resp:  SOB  GI: no vomiting GU: no dysuria Integumentary: no rash  Allergy: no hives  Musculoskeletal: no leg swelling  Neurological: no slurred speech ROS otherwise negative  PAST MEDICAL HISTORY/PAST SURGICAL HISTORY:  Past Medical History:  Diagnosis Date  . Abdominal pain    chronic, recurrent  . Allergy   . Bipolar affective disorder ([place])   . Chronic headaches   . Chronic pain   . Colon polyps    adenomatous  . Cyclical vomiting syndrome   . Depression   . Diverticulosis   . Endometriosis   . Fibrocystic breast   . GERD (gastroesophageal reflux disease)   . Hiatal hernia   . Hiatal hernia   . IBS (irritable bowel syndrome)   . Marijuana dependence ([place])   . Status post repair of paraesophageal diaphragmatic hernia   . Stroke [facility]) 12/2013    MEDICATIONS:  Prior to Admission medications   Medication Sig Start Date End Date Taking? Authorizing Provider  aspirin EC  325 MG tablet Take 325 mg by mouth daily.    [provider]  cyclobenzaprine (FLEXERIL) 10 MG tablet Take 10 mg by mouth 3 (three) times daily as needed for muscle spasms.    [provider]  diclofenac sodium (VOLTAREN) 1 % GEL Apply 1 g topically 3 (three) times daily as needed (back and elbow pain).     [provider]  dicyclomine (BENTYL) 20 MG tablet Take 1 tablet (20 mg total) by mouth 2 (two) times daily. 10/22/19   Lacretia Leigh, MD  ezetimibe-simvastatin (VYTORIN) 10-20 MG tablet Take 1 tablet by mouth at bedtime. 12/11/19 12/10/20  Garvin Fila, MD  furosemide (LASIX) 40 MG tablet Take 40 mg by mouth as needed.  12/13/18   [provider]  gabapentin (NEURONTIN) 300 MG capsule Take 900 mg by mouth 3 (three) times daily as needed (nerve pain).  04/23/15   [provider]  HYDROcodone-acetaminophen (NORCO/VICODIN) 5-325 MG tablet Take 1 tablet by mouth 4 (four) times daily as needed. 11/29/19   [provider]  lamoTRIgine (LAMICTAL) 150 MG tablet Take 150 mg by mouth at bedtime.     [provider]  ondansetron (ZOFRAN ODT) 4 MG disintegrating tablet Take 1 tablet (4 mg total) by mouth every 8 (eight) hours as needed for nausea. 04/07/17   Larene Pickett, PA-C  pantoprazole (PROTONIX) 40  MG tablet Take 1 tablet (40 mg total) by mouth daily. 12/14/15   Irene Shipper, MD  perphenazine (TRILAFON) 4 MG tablet Take 4 mg by mouth 3 (three) times daily.     [provider]  potassium chloride SA (K-DUR,KLOR-CON) 20 MEQ tablet Take 2 tablets (40 mEq total) by mouth 2 (two) times daily for 10 days. 02/08/19 11/20/28  Kinnie Feil, PA-C  pramipexole (MIRAPEX) 0.5 MG tablet Take 0.5 mg by mouth at bedtime. 08/08/18   [provider]  promethazine (PHENERGAN) 25 MG suppository INSERT 1 SUPPOSITORY INTO RECTUM EVERY 8 HOURS AS NEEDED FOR NAUSEA OR VOMITING 12/14/15   Irene Shipper, MD  sucralfate (CARAFATE) 1 g tablet Take 1  g by mouth 4 (four) times daily -  with meals and at bedtime.    [provider]  tiZANidine (ZANAFLEX) 4 MG tablet Take 4 mg by mouth 3 (three) times daily. 12/13/18   [provider]  topiramate (TOPAMAX) 25 MG tablet Take 1 tablet (25 mg total) by mouth at bedtime. 12/11/19 12/10/20  Garvin Fila, MD  venlafaxine XR (EFFEXOR-XR) 75 MG 24 hr capsule Take 75 mg by mouth daily.    [provider]    ALLERGIES:  Allergies  Allergen Reactions  . Dust Mite Extract   . Lyrica [Pregabalin]     Cause bladder issues   . Smoked Meat [Pickled Meat]     SOCIAL HISTORY:  Social History   Tobacco Use  . Smoking status: Never Smoker  . Smokeless tobacco: Never Used  Substance Use Topics  . Alcohol use: No    Alcohol/week: 0.0 standard drinks    FAMILY HISTORY: Family History  Problem Relation Age of Onset  . Brain cancer Mother   . Diabetes Mother   . Breast cancer Sister   . Breast cancer Paternal Grandmother   . Diabetes Sister        x 2  . Colon cancer Neg Hx     EXAM: BP 124/66 (BP Location: Right Arm)   Pulse 79   Temp 98.4 F (36.9 C) (Oral)   Resp 16   Ht 5\' 6"  (1.676 m)   Wt 61.2 kg   SpO2 98%   BMI 21.79 kg/m  CONSTITUTIONAL: Alert and oriented and responds appropriately to questions.  Chronically ill-appearing, in no distress HEAD: Normocephalic EYES: Conjunctivae clear, pupils appear equal, EOM appear intact ENT: normal nose; moist mucous membranes NECK: Supple, normal ROM CARD: RRR; S1 and S2 appreciated; no murmurs, no clicks, no rubs, no gallops RESP: Normal chest excursion without splinting or tachypnea; breath sounds clear and equal bilaterally; no wheezes, no rhonchi, no rales, no hypoxia or respiratory distress, speaking full sentences ABD/GI: Normal bowel sounds; non-distended; soft, non-tender, no rebound, no guarding, no peritoneal signs, no hepatosplenomegaly BACK:  The back appears normal EXT: Normal ROM in all joints; no  deformity noted, no edema; no cyanosis SKIN: Normal color for age and race; warm; no rash on exposed skin NEURO: Moves all extremities equally, no pronator drift, sensation to light touch intact diffusely, cranial nerves II through XII intact, normal speech PSYCH: The patient's mood and manner are appropriate.   MEDICAL DECISION MAKING: Patient here with atypical chest pain.  Does have history of hyperlipidemia, marijuana use but no other significant risk factor for ACS other than age.  She is being seen by Dr. Gwenlyn Found with cardiology.  Per his notes in February, patient did have moderate asymmetric septal hypertrophy on  echocardiogram.  Recommended cardiac MRI as well as a cardiac CTA.  Cardiac CTA scheduled for March 16.  She states she feels her symptoms are getting worse and states she was wondering if these tests could be done here in the hospital today.  Still having active chest pain.  Will give nitroglycerin.  EKG showed no new ischemic change.  She has had 2 normal troponins.  Will discuss with cardiology on-call for their recommendations.  Patient's potassium level is 2.9.  Will give oral and IV replacement.  Will check magnesium level.  ED PROGRESS: 1:21 AM  Spoke with Dr. Kalman Shan with cardiology.  Appreciate his help.  He recommends medicine admission and they can proceed with CTA of the chest to rule out cardiac disease.  He feels the cardiac MRI can still be done as an outpatient.  Patient has been updated with plan.   Patient chest pain-free after nitroglycerin.   2:05 AM Discussed patient's case with hospitalist, Dr. Marlowe Sax.  I have recommended admission and patient (and family if present) agree with this plan. Admitting physician will place admission orders.   I reviewed all nursing notes, vitals, pertinent previous records and interpreted all EKGs, lab and urine results, imaging (as available).     EKG Interpretation  Date/Time:  Monday January 06 2020 19:10:51 EST Ventricular  Rate:  82 PR Interval:  174 QRS Duration: 84 QT Interval:  382 QTC Calculation: 446 R Axis:   22 Text Interpretation: Normal sinus rhythm Septal infarct , age undetermined Abnormal ECG No significant change since last tracing Confirmed by Jayden Kratochvil, Cyril Mourning [phone]) on 01/07/2020 12:41:13 AM        EKG Interpretation  Date/Time:  Tuesday January 07 2020 00:38:32 EST Ventricular Rate:  69 PR Interval:  174 QRS Duration: 102 QT Interval:  420 QTC Calculation: 450 R Axis:   16 Text Interpretation: Sinus rhythm Left ventricular hypertrophy Anterior Q waves, possibly due to LVH Borderline T abnormalities, inferior leads No significant change since last tracing Confirmed by Pryor Curia [phone]) on 01/07/2020 12:44:14 AM          Jeneen Rinks was evaluated in Emergency Department on 01/07/2020 for the symptoms described in the history of present illness. She was evaluated in the context of the global COVID-19 pandemic, which necessitated consideration that the patient might be at risk for infection with the SARS-CoV-2 virus that causes COVID-19. Institutional protocols and algorithms that pertain to the evaluation of patients at risk for COVID-19 are in a state of rapid change based on information released by regulatory bodies including the CDC and federal and state organizations. These policies and algorithms were followed during the patient's care in the ED.  Patient was seen wearing N95, face shield, gloves.    Danette Weinfeld, Delice Bison, DO 01/07/20 [phone]

## 2020-01-07 NOTE — ED Notes (Signed)
attempted report 

## 2020-01-07 NOTE — ED Notes (Signed)
IV team at bedside 

## 2020-01-07 NOTE — ED Notes (Signed)
[name] [phone] pt's sister would like updates in AM.

## 2020-01-07 NOTE — ED Notes (Signed)
Pt is aware a urine sample is needed. 

## 2020-01-07 NOTE — Progress Notes (Addendum)
MD Communication 03.09.21 @ 2241: On call provider notified that Mirapex 1.5 mg PO was left of medication profile.   Late Entry for 03.09.21 @ 2016- Patient seen and assessed. Patient resting in bed using mobile phone. No acute distress noted. Physical assessment completed via computerized charting per [facility] policy. Side rails up x 2. Bed in lowest position and locked. Call light in reach.

## 2020-01-07 NOTE — ED Notes (Signed)
Pt's husband called requesting update. Husband voiced concerns about validity of patient's condition and need for admission. Labs and vitals discussed. Husband voiced understanding, but did not want to speak with patient directly.

## 2020-01-07 NOTE — ED Notes (Signed)
Heart healthy breakfast tray ordered 

## 2020-01-08 ENCOUNTER — Observation Stay (HOSPITAL_COMMUNITY): Payer: Medicare Other

## 2020-01-08 DIAGNOSIS — I421 Obstructive hypertrophic cardiomyopathy: Secondary | ICD-10-CM | POA: Diagnosis not present

## 2020-01-08 DIAGNOSIS — I517 Cardiomegaly: Secondary | ICD-10-CM | POA: Diagnosis not present

## 2020-01-08 DIAGNOSIS — E785 Hyperlipidemia, unspecified: Secondary | ICD-10-CM | POA: Diagnosis not present

## 2020-01-08 DIAGNOSIS — E782 Mixed hyperlipidemia: Secondary | ICD-10-CM | POA: Diagnosis not present

## 2020-01-08 DIAGNOSIS — R0789 Other chest pain: Secondary | ICD-10-CM | POA: Diagnosis not present

## 2020-01-08 DIAGNOSIS — F3170 Bipolar disorder, currently in remission, most recent episode unspecified: Secondary | ICD-10-CM | POA: Diagnosis not present

## 2020-01-08 DIAGNOSIS — E876 Hypokalemia: Secondary | ICD-10-CM | POA: Diagnosis not present

## 2020-01-08 LAB — LIPID PANEL
Cholesterol: 129 mg/dL (ref 0–200)
HDL: 42 mg/dL (ref >40)
LDL Cholesterol: 67 mg/dL (ref 0–99)
Total CHOL/HDL Ratio: 3.1 RATIO
Triglycerides: 98 mg/dL (ref <150)
VLDL: 20 mg/dL (ref 0–40)

## 2020-01-08 MED ORDER — METOPROLOL TARTRATE 25 MG PO TABS: 12.5000 mg | ORAL_TABLET | Freq: Two times a day (BID) | ORAL | 6 refills | Status: DC

## 2020-01-08 MED ORDER — GADOBUTROL 1 MMOL/ML IV SOLN
8.0000 mL | Freq: Once | INTRAVENOUS | Status: AC | PRN
  Administered 2020-01-08: 8 mL via INTRAVENOUS

## 2020-01-08 NOTE — Discharge Summary (Signed)
Physician Discharge Summary  [name] [id number] DOB: [birth year] DOA: 01/06/2020  PCP: Rocco Serene, MD  Admit date: 01/06/2020 Discharge date: 01/08/2020  Admitted From: home Discharge disposition: home   Recommendations for Outpatient Follow-Up:   1. Follow up with cardiology as scheduled for results Card MRI   Discharge Diagnosis:   Principal Problem:   Atypical chest pain Active Problems:   Hypokalemia   LVH (left ventricular hypertrophy)   Polysubstance abuse (HCC)   Hyperlipidemia   Chronic pain   Bipolar affective disorder ([place])    Discharge Condition: Improved.  Diet recommendation: Low sodium, heart healthy.  Carbohydrate-modified.  Regular.  Wound care: None.  Code status: Full.   History of Present Illness:   [name] is a 64 y.o. female with medical history significant of CVA; marijuana dependence; IBS: bipolar; and chronic pain presented 3/9 with chest pain.  She was last seen by Dr. Gwenlyn Found on 2/26 re: echo from 2/12 which showed moderate asymmetric septal hypertrophy with significant outflow tract gradient.  She was scheduled for a cardiac CTA and also cardiac MRI.  She called his office 3/8; they recommended that she come to the ER for further testing.  For the previous 3 days, she had been having CP, SOB (particularly worse with conversation and exertion).  When moving around, she noticed numbness and tingling in her face and hands and leg swelling and her vision "was kind of squiggly" with symptoms similar to prior CVA.  CP actually appeared to be in LUQ with pressure up into left chest and axilla.  No n/v.   Hospital Course by Problem:   Chest pain. Patient with left-sided chest pain with SOB. Recently seen Dr. Gwenlyn Found and had an abnormal echo with moderate septal hypertrophy and significant outflow tract gradient; she was recommended to have both a cardiac CTA and MRI.CXR unremarkable. HS troponin negative x 2. EKG not  indicative of acute ischemia but with possible septal ischemia of uncertain duration .  Evaluated by cardiology who recommended cardiac CT that showed LVH and systolic anterior motion of the anterior valve leaflet.  They then recommended a cardiac MRI. Will follow up with cards 3/23 as scheduled. BB added to med list.   HLD Lipids were checked in 12/2019 (TC 170, HDL 62, LDL 85, TG 131).  H/o CVA Current symptoms are not overly concerning for CVA.  Marijuana dependence. acknowledged marijuana use but denied other drugs. Prior UDS positive for a variety of substances intermittently and marijuana regularly. Cessation encouraged. UDS +opiates and marijuana  Chronic pain stable Continue Zanaflex, Neurontin, Norco  Bipolar. Stable Continue Lamictal, Trilafon, Effexor, Topamax  Recurrent n/v Continue Carafate, Protonix, Bentyl    Medical Consultants:   [facility]    Discharge Exam:   Vitals:   01/08/20 0513 01/08/20 1307  BP: 116/74 112/72  Pulse: 81 63  Resp: 16 18  Temp: 98 F (36.7 C) 98.3 F (36.8 C)  SpO2: 99% 98%   Vitals:   01/07/20 2335 01/08/20 0513 01/08/20 0520 01/08/20 1307  BP: 131/90 116/74  112/72  Pulse: 67 81  63  Resp: 20 16  18   Temp: (!) 97.2 F (36.2 C) 98 F (36.7 C)  98.3 F (36.8 C)  TempSrc: Oral Oral  Oral  SpO2: 100% 99%  98%  Weight:   58.2 kg   Height:        General exam: Appears calm and comfortable.  Respiratory system: Clear to auscultation. Respiratory  effort normal. Cardiovascular system: S1 & S2 heard, RRR. No JVD,  rubs, gallops or clicks. No murmurs. Gastrointestinal system: Abdomen is nondistended, soft and nontender. No organomegaly or masses felt. Normal bowel sounds heard. Central nervous system: Alert and oriented. No focal neurological deficits. Extremities: No clubbing,  or cyanosis. No edema. Skin: No rashes, lesions or ulcers. Psychiatry: Judgement and insight appear normal. Mood & affect appropriate.     The results of significant diagnostics from this hospitalization (including imaging, microbiology, ancillary and laboratory) are listed below for reference.     Procedures and Diagnostic Studies:   DG Chest 2 View  Result Date: 01/06/2020 CLINICAL DATA:  Chest pain EXAM: CHEST - 2 VIEW COMPARISON:  11/19/2019 FINDINGS: The heart size and mediastinal contours are within normal limits. Both lungs are clear. The visualized skeletal structures are unremarkable. IMPRESSION: No active cardiopulmonary disease. Electronically Signed   By: Constance Holster M.D.   On: 01/06/2020 19:29   CT CORONARY MORPH W/CTA COR W/SCORE W/CA W/CM &/OR WO/CM  Addendum Date: 01/07/2020   ADDENDUM REPORT: 01/07/2020 17:42 CLINICAL DATA:  64 year old female with chest pain. EXAM: Cardiac/Coronary  CTA TECHNIQUE: The patient was scanned on a [employer]. FINDINGS: A 100 kV prospective scan was triggered in the descending thoracic aorta at 111 HU's. Axial non-contrast 3 mm slices were carried out through the heart. The data set was analyzed on a dedicated work station and scored using the [place]. Gantry rotation speed was 250 msecs and collimation was .6 mm. 10 mg of ivabradine and 0.8 mg of sl NTG was given. The 3D data set was reconstructed in 5% intervals of the 67-82 % of the R-R cycle. Diastolic phases were analyzed on a dedicated work station using MPR, MIP and VRT modes. The patient received 80 cc of contrast. Aorta: Normal size. Mild atherosclerosis and calcifications. No dissection. Aortic Valve:  Trileaflet.  No calcifications. Coronary Arteries:  Normal coronary origin.  Right dominance. RCA is a large dominant artery that gives rise to PDA and PLA. There is no plaque. Left main is a large artery that gives rise to LAD and LCX arteries. Left main has no plaque. LAD is a large vessel that gives rise to one diagonal artery and has no plaque. LCX is a non-dominant artery that gives rise to two large OM  branches. There is no plaque. Other findings: Normal pulmonary vein drainage into the left atrium. Normal left atrial appendage without a thrombus. Normal size of the pulmonary artery. IMPRESSION: 1. Coronary calcium score of 0. This was 0 percentile for age and sex matched control. 2. Normal coronary origin with right dominance. 3. CAD-RADS 0. No evidence of CAD (0%). Consider non-atherosclerotic causes of chest pain. 4. There is moderate concentric left ventricular hypertrophy and systolic anterior motion of the anterior mitral valve leaflet (SAM). Electronically Signed   By: Ena Dawley   On: 01/07/2020 17:42   Result Date: 01/07/2020 EXAM: OVER-READ INTERPRETATION CT CHEST The following report is an over-read performed by radiologist Dr. Evangeline Dakin of [facility], [place] on 01/07/2020. This over-read does not include interpretation of cardiac or coronary anatomy or pathology. The coronary calcium score/coronary CTA interpretation by the cardiologist is attached. COMPARISON:  None. FINDINGS: Vascular: Mild to moderate atherosclerosis involving the descending thoracic aorta with predominantly noncalcified plaque. No evidence of aortic aneurysm. Mediastinum/Nodes: No pathologic lymphadenopathy within the visualized mediastinum. Visualized esophagus normal in appearance. Lungs/Pleura: Emphysematous changes in the visualized lung parenchyma. Minimal scarring in the  lower lobes. Visualized lung parenchyma otherwise clear. Central airways patent with mild bronchial wall thickening. Upper Abdomen: Unremarkable for the early arterial phase of enhancement Musculoskeletal: Osseous demineralization suspected. No acute findings. IMPRESSION: 1. Mild to moderate atherosclerosis involving the descending thoracic aorta with predominantly noncalcified plaque. No evidence of aortic aneurysm. 2. Mild central bronchial wall thickening indicating asthma and/or bronchitis. Aortic Atherosclerosis (ICD10-170.0) and  Emphysema (ICD10-J43.9). Electronically Signed: By: Evangeline Dakin M.D. On: 01/07/2020 16:35     Labs:   Basic Metabolic Panel: Recent Labs  Lab 01/06/20 1916 01/06/20 2141 01/07/20 0855  NA 134*  --  141  K 2.9*  --  3.7  CL 99  --  104  CO2 21*  --  24  GLUCOSE 129*  --  100*  BUN 23  --  17  CREATININE 0.85  --  0.66  CALCIUM 9.2  --  9.3  MG  --  1.7  --    GFR Estimated Creatinine Clearance: 67 mL/min (by C-G formula based on SCr of 0.66 mg/dL). Liver Function Tests: Recent Labs  Lab 01/07/20 0855  AST 18  ALT 13  ALKPHOS 67  BILITOT 0.7  PROT 6.7  ALBUMIN 4.1   Recent Labs  Lab 01/07/20 0855  LIPASE 18   No results for input(s): AMMONIA in the last 168 hours. Coagulation profile No results for input(s): INR, PROTIME in the last 168 hours.  CBC: Recent Labs  Lab 01/06/20 1916  WBC 11.2*  HGB 13.9  HCT 40.3  MCV 98.1  PLT 278   Cardiac Enzymes: No results for input(s): CKTOTAL, CKMB, CKMBINDEX, TROPONINI in the last 168 hours. BNP: Invalid input(s): POCBNP CBG: No results for input(s): GLUCAP in the last 168 hours. D-Dimer No results for input(s): DDIMER in the last 72 hours. Hgb A1c Recent Labs    01/07/20 0856 01/07/20 1314  HGBA1C 5.1 5.1   Lipid Profile Recent Labs    01/08/20 1145  CHOL 129  HDL 42  LDLCALC 67  TRIG 98  CHOLHDL 3.1   Thyroid function studies Recent Labs    01/07/20 0856  TSH 2.547   Anemia work up No results for input(s): VITAMINB12, FOLATE, FERRITIN, TIBC, IRON, RETICCTPCT in the last 72 hours. Microbiology Recent Results (from the past 240 hour(s))  SARS CORONAVIRUS 2 (TAT 6-24 HRS) Nasopharyngeal Nasopharyngeal Swab     Status: None   Collection Time: 01/07/20  2:35 AM   Specimen: Nasopharyngeal Swab  Result Value Ref Range Status   SARS Coronavirus 2 NEGATIVE NEGATIVE Final    Comment: (NOTE) SARS-CoV-2 target nucleic acids are NOT DETECTED. The SARS-CoV-2 RNA is generally detectable in  upper and lower respiratory specimens during the acute phase of infection. Negative results do not preclude SARS-CoV-2 infection, do not rule out co-infections with other pathogens, and should not be used as the sole basis for treatment or other patient management decisions. Negative results must be combined with clinical observations, patient history, and epidemiological information. The expected result is Negative. Fact Sheet for Patients: SugarRoll.be Fact Sheet for Healthcare Providers: https://www.woods-mathews.com/ This test is not yet approved or cleared by the [place] FDA and  has been authorized for detection and/or diagnosis of SARS-CoV-2 by FDA under an Emergency Use Authorization (EUA). This EUA will remain  in effect (meaning this test can be used) for the duration of the COVID-19 declaration under Section 56 4(b)(1) of the Act, 21 U.S.C. section 360bbb-3(b)(1), unless the authorization is terminated or revoked sooner. Performed  at [facility], [place] [address] [place], [place] [address]      Discharge Instructions:    Allergies as of 01/08/2020      Reactions   Dust Mite Extract    Lyrica [pregabalin]    Cause bladder issues       Medication List    TAKE these medications   aspirin EC 325 MG tablet Take 325 mg by mouth daily.   cyclobenzaprine 10 MG tablet Commonly known as: FLEXERIL Take 10 mg by mouth 3 (three) times daily as needed for muscle spasms.   diclofenac sodium 1 % Gel Commonly known as: VOLTAREN Apply 1 g topically 3 (three) times daily as needed (back and elbow pain).   dicyclomine 20 MG tablet Commonly known as: BENTYL Take 1 tablet (20 mg total) by mouth 2 (two) times daily.   ezetimibe-simvastatin 10-20 MG tablet Commonly known as: Vytorin Take 1 tablet by mouth at bedtime.   furosemide 40 MG tablet Commonly known as: LASIX Take 40 mg by mouth as needed for fluid.     gabapentin 300 MG capsule Commonly known as: NEURONTIN Take 900 mg by mouth 3 (three) times daily as needed (nerve pain).   HYDROcodone-acetaminophen 5-325 MG tablet Commonly known as: NORCO/VICODIN Take 1 tablet by mouth 4 (four) times daily as needed for moderate pain.   lamoTRIgine 150 MG tablet Commonly known as: LAMICTAL Take 150 mg by mouth at bedtime.   ondansetron 4 MG disintegrating tablet Commonly known as: Zofran ODT Take 1 tablet (4 mg total) by mouth every 8 (eight) hours as needed for nausea.   pantoprazole 40 MG tablet Commonly known as: PROTONIX Take 1 tablet (40 mg total) by mouth daily.   perphenazine 4 MG tablet Commonly known as: TRILAFON Take 4 mg by mouth 3 (three) times daily.   potassium chloride SA 20 MEQ tablet Commonly known as: KLOR-CON Take 2 tablets (40 mEq total) by mouth 2 (two) times daily for 10 days.   promethazine 25 MG suppository Commonly known as: PHENERGAN INSERT 1 SUPPOSITORY INTO RECTUM EVERY 8 HOURS AS NEEDED FOR NAUSEA OR VOMITING What changed:   how much to take  how to take this  when to take this  reasons to take this  additional instructions   sucralfate 1 g tablet Commonly known as: CARAFATE Take 1 g by mouth 4 (four) times daily -  with meals and at bedtime.   tiZANidine 4 MG tablet Commonly known as: ZANAFLEX Take 4 mg by mouth 3 (three) times daily.   topiramate 25 MG tablet Commonly known as: Topamax Take 1 tablet (25 mg total) by mouth at bedtime.   venlafaxine XR 75 MG 24 hr capsule Commonly known as: EFFEXOR-XR Take 75 mg by mouth at bedtime.         Time coordinating discharge: 45 minutes  Signed:  Eulogio Bear [facility] 01/08/2020, 2:23 PM

## 2020-01-08 NOTE — Plan of Care (Signed)

## 2020-01-08 NOTE — Progress Notes (Addendum)
Progress Note  Patient Name: [name] Date of Encounter: 01/08/2020  Primary Cardiologist: Quay Burow, MD   Subjective   Cardiac CT showed LVH and systolic anterior motion of the anterior valve leaflet. Cardiac MRI this AM. Patient says she has some chest discomfort after MRI after having to hold her breath.   Inpatient Medications    Scheduled Meds: . aspirin EC  325 mg Oral Daily  . dicyclomine  20 mg Oral BID  . enoxaparin (LOVENOX) injection  40 mg Subcutaneous Q24H  . ezetimibe  10 mg Oral QHS   And  . simvastatin  20 mg Oral QHS  . lamoTRIgine  150 mg Oral QHS  . pantoprazole  40 mg Oral Daily  . perphenazine  4 mg Oral TID  . sodium chloride flush  10-40 mL Intracatheter Q12H  . sucralfate  1 g Oral TID WC & HS  . tiZANidine  4 mg Oral TID  . topiramate  25 mg Oral QHS  . venlafaxine XR  75 mg Oral QHS   Continuous Infusions:  PRN Meds: acetaminophen, gabapentin, HYDROcodone-acetaminophen, ondansetron (ZOFRAN) IV, sodium chloride flush   Vital Signs    Vitals:   01/07/20 1943 01/07/20 2335 01/08/20 0513 01/08/20 0520  BP: 101/68 131/90 116/74   Pulse: 64 67 81   Resp: 14 20 16    Temp: 98 F (36.7 C) (!) 97.2 F (36.2 C) 98 F (36.7 C)   TempSrc: Oral Oral Oral   SpO2: 97% 100% 99%   Weight:    58.2 kg  Height:        Intake/Output Summary (Last 24 hours) at 01/08/2020 0744 Last data filed at 01/08/2020 0309 Gross per 24 hour  Intake 130 ml  Output 950 ml  Net -820 ml   Last 3 Weights 01/08/2020 01/07/2020 01/06/2020  Weight (lbs) 128 lb 3.2 oz 132 lb 135 lb  Weight (kg) 58.151 kg 59.875 kg 61.236 kg      Telemetry    NSR, HR 60s, no other arhythmias noted - Personally Reviewed  ECG    NSR 61 bpm, nonspecific ST/T wave changes - Personally Reviewed  Physical Exam   GEN: No acute distress.   Neck: No JVD Cardiac: RRR, no murmurs, rubs, or gallops.  Respiratory: Clear to auscultation bilaterally. GI: Soft, nontender,  non-distended  MS: No edema; No deformity. Neuro:  Nonfocal  Psych: Normal affect   Labs    High Sensitivity Troponin:   Recent Labs  Lab 01/06/20 1916 01/06/20 2141  TROPONINIHS 13 12      Chemistry Recent Labs  Lab 01/06/20 1916 01/07/20 0855  NA 134* 141  K 2.9* 3.7  CL 99 104  CO2 21* 24  GLUCOSE 129* 100*  BUN 23 17  CREATININE 0.85 0.66  CALCIUM 9.2 9.3  PROT  --  6.7  ALBUMIN  --  4.1  AST  --  18  ALT  --  13  ALKPHOS  --  67  BILITOT  --  0.7  GFRNONAA >60 >60  GFRAA >60 >60  ANIONGAP 14 13     Hematology Recent Labs  Lab 01/06/20 1916  WBC 11.2*  RBC 4.11  HGB 13.9  HCT 40.3  MCV 98.1  MCH 33.8  MCHC 34.5  RDW 11.9  PLT 278    BNPNo results for input(s): BNP, PROBNP in the last 168 hours.   DDimer No results for input(s): DDIMER in the last 168 hours.   Radiology  DG Chest 2 View  Result Date: 01/06/2020 CLINICAL DATA:  Chest pain EXAM: CHEST - 2 VIEW COMPARISON:  11/19/2019 FINDINGS: The heart size and mediastinal contours are within normal limits. Both lungs are clear. The visualized skeletal structures are unremarkable. IMPRESSION: No active cardiopulmonary disease. Electronically Signed   By: Constance Holster M.D.   On: 01/06/2020 19:29   CT CORONARY MORPH W/CTA COR W/SCORE W/CA W/CM &/OR WO/CM  Addendum Date: 01/07/2020   ADDENDUM REPORT: 01/07/2020 17:42 CLINICAL DATA:  64 year old female with chest pain. EXAM: Cardiac/Coronary  CTA TECHNIQUE: The patient was scanned on a [employer]. FINDINGS: A 100 kV prospective scan was triggered in the descending thoracic aorta at 111 HU's. Axial non-contrast 3 mm slices were carried out through the heart. The data set was analyzed on a dedicated work station and scored using the [place]. Gantry rotation speed was 250 msecs and collimation was .6 mm. 10 mg of ivabradine and 0.8 mg of sl NTG was given. The 3D data set was reconstructed in 5% intervals of the 67-82 % of the R-R  cycle. Diastolic phases were analyzed on a dedicated work station using MPR, MIP and VRT modes. The patient received 80 cc of contrast. Aorta: Normal size. Mild atherosclerosis and calcifications. No dissection. Aortic Valve:  Trileaflet.  No calcifications. Coronary Arteries:  Normal coronary origin.  Right dominance. RCA is a large dominant artery that gives rise to PDA and PLA. There is no plaque. Left main is a large artery that gives rise to LAD and LCX arteries. Left main has no plaque. LAD is a large vessel that gives rise to one diagonal artery and has no plaque. LCX is a non-dominant artery that gives rise to two large OM branches. There is no plaque. Other findings: Normal pulmonary vein drainage into the left atrium. Normal left atrial appendage without a thrombus. Normal size of the pulmonary artery. IMPRESSION: 1. Coronary calcium score of 0. This was 0 percentile for age and sex matched control. 2. Normal coronary origin with right dominance. 3. CAD-RADS 0. No evidence of CAD (0%). Consider non-atherosclerotic causes of chest pain. 4. There is moderate concentric left ventricular hypertrophy and systolic anterior motion of the anterior mitral valve leaflet (SAM). Electronically Signed   By: Ena Dawley   On: 01/07/2020 17:42   Result Date: 01/07/2020 EXAM: OVER-READ INTERPRETATION CT CHEST The following report is an over-read performed by radiologist Dr. Evangeline Dakin of [facility], [place] on 01/07/2020. This over-read does not include interpretation of cardiac or coronary anatomy or pathology. The coronary calcium score/coronary CTA interpretation by the cardiologist is attached. COMPARISON:  None. FINDINGS: Vascular: Mild to moderate atherosclerosis involving the descending thoracic aorta with predominantly noncalcified plaque. No evidence of aortic aneurysm. Mediastinum/Nodes: No pathologic lymphadenopathy within the visualized mediastinum. Visualized esophagus normal in appearance.  Lungs/Pleura: Emphysematous changes in the visualized lung parenchyma. Minimal scarring in the lower lobes. Visualized lung parenchyma otherwise clear. Central airways patent with mild bronchial wall thickening. Upper Abdomen: Unremarkable for the early arterial phase of enhancement Musculoskeletal: Osseous demineralization suspected. No acute findings. IMPRESSION: 1. Mild to moderate atherosclerosis involving the descending thoracic aorta with predominantly noncalcified plaque. No evidence of aortic aneurysm. 2. Mild central bronchial wall thickening indicating asthma and/or bronchitis. Aortic Atherosclerosis (ICD10-170.0) and Emphysema (ICD10-J43.9). Electronically Signed: By: Evangeline Dakin M.D. On: 01/07/2020 16:35    Cardiac Studies   Echo 12/13/19 1. Left ventricular ejection fraction, by estimation, is 65 to 70%. The  left  ventricle has normal function. The left ventricle has no regional  wall motion abnormalities. There is mildly increased left ventricular  hypertrophy with moderate asymmetric  basal septal hypertrophy (14 mm). Left ventricular diastolic parameters  were normal. The average left ventricular global longitudinal strain is  -22.6 %. Severe LVOT obstruction with maximal instantaneous gradient 66  mmHg (Vmax 4 m/s). Hypertrophied  papillary muscle may contribute to obstruction in hyperdynamic ventricle.  No definite systolic anterior motion of the mitral valve, mild chordal  SAM. Consider cardiac MRI if clinically indicated to evaluate for  hypertrophic cardiomyopathy.  2. Right ventricular systolic function is normal. The right ventricular  size is normal. Tricuspid regurgitation signal is inadequate for assessing  PA pressure.  3. The mitral valve is normal in structure and function. Trivial mitral  valve regurgitation.  4. The aortic valve is tricuspid. Aortic valve regurgitation is not  visualized. No aortic stenosis is present.  5. The inferior vena cava is  normal in size with greater than 50%  respiratory variability, suggesting right atrial pressure of 3 mmHg.   Cardiac CTA  IMPRESSION: 1. Coronary calcium score of 0. This was 0 percentile for age and sex matched control.  2. Normal coronary origin with right dominance.  3. CAD-RADS 0. No evidence of CAD (0%). Consider non-atherosclerotic causes of chest pain.  4. There is moderate concentric left ventricular hypertrophy and systolic anterior motion of the anterior mitral valve leaflet (SAM).    Patient Profile     64 y.o. female with pmh of chronic pain, prior stroke, psychiatric issues, HLD, and previous marijuana use who is being seen for chest pain.   Assessment & Plan    Chest pain Patient with HLD and +family history who recently established with Dr. Gwenlyn Found for exertional chest pain. In the ED HS troponin 13>12. EKG with some ST changes and septal Q waves.  - Aspirin 325mg  daily for prior stroke -  lipid panel pending and A1C 5.1 - Echo showed normal LV function, no RWMA, basal septal hypertrophy, LVOT obstruction. - Cardiac CT showed coronary calcium score of 0, no CAD, and moderate concentric LVH and systolic anterior motion of the mitral valve leaflet - Cardiac MRI this AM  Prior stroke - Asprin 325 mg daily  HLD - Vytorin at baseline -  lipid profile pending  LLE - Patient reported lower leg swelling on Sunday improved with oral lasix - continue to monitor, appears euvolemic on exam  Marijuana use - UDS positive for opiates and THC  For questions or updates, please contact [facility] Please consult [email or web address] for contact info under   Signed, Cadence Ninfa Meeker, PA-C  01/08/2020, 7:44 AM    The patient was seen, examined and discussed with Cadence Ninfa Meeker, PA-C   and I agree with the above.   64 y.o. female, patient of Dr. Gwenlyn Found, with chronic neck and back pain,, chest pain, prior stroke, psychiatric issues, HLD, and previous marijuana use who  is being seen today for the evaluation of chest pain. She was recently referred to cardiology for atypical chest pain first seen on 12/03/19.  She has no prior history of heart disease, but has family history of premature coronary artery disease with and died in her 60s of myocardial infarction.   The patient has been having on and off chest pain for the last 6 months exertional and nonexertional, associated with shortness of breath, not relieved by nitroglycerin, her current chest pain has been ongoing for last 3  days.  She was scheduled to undergo coronary CTA however was hypotensive and feels rescheduled. She also had an echocardiogram that showed moderate asymmetrical septal hypertrophy consistent with outflow tract gradient of 66 mmHg; basal septum was thicker compared to echo in 2015. Dr. Gwenlyn Found planned to further assess with cardiac MRI to evaluate for possible hypertrophic cardiomyopathy.  I have reviewed her echocardiogram and she has hyperdynamic LVEF 65 to 70% with moderate basal septal hypertrophy, there is an LVOT gradient of 21 mmHg and increases to 68 with Valsalva maneuvers. Her CT shows moderate concentric LVH and systolic anterior motion of the mitral valve leaflet, will order cardiac MRI.  Her CT did not show any evidence for coronary artery disease, will proceed with cardiac MRI that is scheduled for this morning.  Given significant LVOT obstructions I will start her on low-dose of beta-blocker metoprolol 12.5 mg p.o. twice daily.   She can be discharged after MRI.  Ena Dawley, MD 01/08/2020

## 2020-01-10 ENCOUNTER — Ambulatory Visit: Payer: Medicare Other | Admitting: Cardiovascular Disease

## 2020-01-14 ENCOUNTER — Ambulatory Visit (HOSPITAL_COMMUNITY): Payer: Medicare Other

## 2020-01-17 ENCOUNTER — Ambulatory Visit
Admission: RE | Admit: 2020-01-17 | Discharge: 2020-01-17 | Disposition: A | Discharge status: Home / Self Care | Payer: Medicare Other | Source: Ambulatory Visit | Attending: Internal Medicine | Admitting: Internal Medicine

## 2020-01-17 ENCOUNTER — Other Ambulatory Visit: Payer: Self-pay

## 2020-01-17 ENCOUNTER — Other Ambulatory Visit: Payer: Self-pay | Admitting: Internal Medicine

## 2020-01-17 DIAGNOSIS — Z1231 Encounter for screening mammogram for malignant neoplasm of breast: Secondary | ICD-10-CM

## 2020-01-17 DIAGNOSIS — N63 Unspecified lump in unspecified breast: Secondary | ICD-10-CM

## 2020-01-17 DIAGNOSIS — E2839 Other primary ovarian failure: Secondary | ICD-10-CM

## 2020-01-17 DIAGNOSIS — N644 Mastodynia: Secondary | ICD-10-CM

## 2020-01-17 DIAGNOSIS — N6452 Nipple discharge: Secondary | ICD-10-CM

## 2020-01-21 ENCOUNTER — Encounter: Payer: Self-pay | Admitting: Cardiovascular Disease

## 2020-01-21 ENCOUNTER — Other Ambulatory Visit: Payer: Self-pay

## 2020-01-21 ENCOUNTER — Ambulatory Visit: Payer: Medicare Other | Admitting: Cardiovascular Disease

## 2020-01-21 DIAGNOSIS — R0789 Other chest pain: Secondary | ICD-10-CM

## 2020-01-21 DIAGNOSIS — E782 Mixed hyperlipidemia: Secondary | ICD-10-CM

## 2020-01-21 NOTE — Progress Notes (Signed)
01/21/2020 [name]   [birth year]  [id number]  Primary Physician Anne Serene, MD Primary Cardiologist: Anne Harp MD [name], [place]  HPI:  [name] is a 64 y.o.  thin appearing married Caucasian female mother of 69 weeks voice, grandmother 7 grandchildren referred by Dr. Cottie Griffith for cardiovascular relation because of atypical chest pain.  I last saw her in the office 12/03/2019.  She is accompanied by her husband by telephone today. She is currently disabled because of neck and back pain, prior stroke and psychiatric issues.  Risk factors include treated hyperlipidemia and family history.  Her father had a myocardial infarction age 47.  She does not smoke tobacco but does smoke marijuana.  She is had a stroke in the past but does not have a heart attack.  Since August she is noted exertional chest pressure.  She was hospitalized for 1 day on 01/06/2020 for chest pain and ruled out for myocardial infarction.  A coronary CT revealed a coronary calcium score of 0 with no evidence of CAD.  Cardiac MRI did show basal septal hypertrophy.  She had anterior motion of the mitral valve and peak gradient of 60 mmHg with Valsalva suggesting dynamic outflow obstruction.  She is placed on low-dose beta-blocker by Dr. Meda Griffith.   Current Meds  Medication Sig  . aspirin EC 325 MG tablet Take 325 mg by mouth daily.  . cyclobenzaprine (FLEXERIL) 10 MG tablet Take 10 mg by mouth 3 (three) times daily as needed for muscle spasms.  . diclofenac sodium (VOLTAREN) 1 % GEL Apply 1 g topically 3 (three) times daily as needed (back and elbow pain).   Marland Kitchen dicyclomine (BENTYL) 20 MG tablet Take 1 tablet (20 mg total) by mouth 2 (two) times daily.  Marland Kitchen ezetimibe-simvastatin (VYTORIN) 10-20 MG tablet Take 1 tablet by mouth at bedtime.  . furosemide (LASIX) 40 MG tablet Take 40 mg by mouth as needed for fluid.   Marland Kitchen gabapentin (NEURONTIN) 300 MG capsule Take 900 mg by mouth 3 (three) times daily  as needed (nerve pain).   Marland Kitchen HYDROcodone-acetaminophen (NORCO/VICODIN) 5-325 MG tablet Take 1 tablet by mouth 4 (four) times daily as needed for moderate pain.   Marland Kitchen lamoTRIgine (LAMICTAL) 150 MG tablet Take 150 mg by mouth at bedtime.   . metoprolol tartrate (LOPRESSOR) 25 MG tablet Take 0.5 tablets (12.5 mg total) by mouth 2 (two) times daily.  . ondansetron (ZOFRAN ODT) 4 MG disintegrating tablet Take 1 tablet (4 mg total) by mouth every 8 (eight) hours as needed for nausea.  . pantoprazole (PROTONIX) 40 MG tablet Take 1 tablet (40 mg total) by mouth daily.  Marland Kitchen perphenazine (TRILAFON) 4 MG tablet Take 4 mg by mouth 3 (three) times daily.   . potassium chloride SA (K-DUR,KLOR-CON) 20 MEQ tablet Take 2 tablets (40 mEq total) by mouth 2 (two) times daily for 10 days.  . pramipexole (MIRAPEX) 1.5 MG tablet Take 1.5 mg by mouth daily.  . promethazine (PHENERGAN) 25 MG suppository INSERT 1 SUPPOSITORY INTO RECTUM EVERY 8 HOURS AS NEEDED FOR NAUSEA OR VOMITING (Patient taking differently: Place 25 mg rectally every 8 (eight) hours as needed for nausea or vomiting. )  . sucralfate (CARAFATE) 1 g tablet Take 1 g by mouth 4 (four) times daily -  with meals and at bedtime.  Marland Kitchen tiZANidine (ZANAFLEX) 4 MG tablet Take 4 mg by mouth 3 (three) times daily.  Marland Kitchen topiramate (TOPAMAX) 25 MG tablet Take  1 tablet (25 mg total) by mouth at bedtime.  Marland Kitchen venlafaxine XR (EFFEXOR-XR) 75 MG 24 hr capsule Take 75 mg by mouth at bedtime.      Allergies  Allergen Reactions  . Dust Mite Extract   . Lyrica [Pregabalin]     Cause bladder issues     Social History   Socioeconomic History  . Marital status: Married    Spouse name: Not on file  . Number of children: 3  . Years of education: Not on file  . Highest education level: Not on file  Occupational History  . Occupation: disabled  Tobacco Use  . Smoking status: Never Smoker  . Smokeless tobacco: Never Used  Substance and Sexual Activity  . Alcohol use: No     Alcohol/week: 0.0 standard drinks  . Drug use: Yes    Types: Marijuana    Comment: smokes daily   . Sexual activity: Not on file  Other Topics Concern  . Not on file  Social History Narrative  . Not on file   Social Determinants of Health   Financial Resource Strain:   . Difficulty of Paying Living Expenses:   Food Insecurity:   . Worried About Charity fundraiser in the Last Year:   . Arboriculturist in the Last Year:   Transportation Needs:   . Film/video editor (Medical):   Marland Kitchen Lack of Transportation (Non-Medical):   Physical Activity:   . Days of Exercise per Week:   . Minutes of Exercise per Session:   Stress:   . Feeling of Stress :   Social Connections:   . Frequency of Communication with Friends and Family:   . Frequency of Social Gatherings with Friends and Family:   . Attends Religious Services:   . Active Member of Clubs or Organizations:   . Attends Archivist Meetings:   Marland Kitchen Marital Status:   Intimate Partner Violence:   . Fear of Current or Ex-Partner:   . Emotionally Abused:   Marland Kitchen Physically Abused:   . Sexually Abused:      Review of Systems: General: negative for chills, fever, night sweats or weight changes.  Cardiovascular: negative for chest pain, dyspnea on exertion, edema, orthopnea, palpitations, paroxysmal nocturnal dyspnea or shortness of breath Dermatological: negative for rash Respiratory: negative for cough or wheezing Urologic: negative for hematuria Abdominal: negative for nausea, vomiting, diarrhea, bright red blood per rectum, melena, or hematemesis Neurologic: negative for visual changes, syncope, or dizziness All other systems reviewed and are otherwise negative except as noted above.    Blood pressure 94/64, pulse 69, height 5\' 6"  (1.676 m), weight 134 lb 12.8 oz (61.1 kg), SpO2 98 %.  General appearance: alert and no distress Neck: no adenopathy, no carotid bruit, no JVD, supple, symmetrical, trachea midline and thyroid  not enlarged, symmetric, no tenderness/mass/nodules Lungs: clear to auscultation bilaterally Heart: regular rate and rhythm, S1, S2 normal, no murmur, click, rub or gallop Extremities: extremities normal, atraumatic, no cyanosis or edema Pulses: 2+ and symmetric Skin: Skin color, texture, turgor normal. No rashes or lesions Neurologic: Alert and oriented X 3, normal strength and tone. Normal symmetric reflexes. Normal coordination and gait  EKG not performed today  ASSESSMENT AND PLAN:   Hyperlipidemia History of hyperlipidemia on Vytorin with lipid profile performed 01/08/2020 revealing total cholesterol 129, LDL 67 and HDL 42.  Atypical chest pain History of atypical chest pain with recent admission for chest pain 01/06/2020.  She did have a  coronary CTA that revealed coronary calcium score of 0 and no evidence of CAD.  She did have a cardiac MRI that did show basal septal hypertrophy with systolic anterior motion of the mitral valve and a peak outflow tract gradient of 68 with Valsalva.  She was seen by Dr. Meda Griffith in consultation in the hospital and placed on beta-blockade.  I reassured her that her chest pain was noncardiac.      Anne Harp MD FACP,FACC,FAHA, [facility] 01/21/2020 11:51 AM

## 2020-01-21 NOTE — Assessment & Plan Note (Signed)
History of hyperlipidemia on Vytorin with lipid profile performed 01/08/2020 revealing total cholesterol 129, LDL 67 and HDL 42.

## 2020-01-21 NOTE — Assessment & Plan Note (Signed)
History of atypical chest pain with recent admission for chest pain 01/06/2020.  She did have a coronary CTA that revealed coronary calcium score of 0 and no evidence of CAD.  She did have a cardiac MRI that did show basal septal hypertrophy with systolic anterior motion of the mitral valve and a peak outflow tract gradient of 68 with Valsalva.  She was seen by Dr. Meda Coffee in consultation in the hospital and placed on beta-blockade.  I reassured her that her chest pain was noncardiac.

## 2020-01-21 NOTE — Patient Instructions (Signed)
Medication Instructions:  NO CHANGE *If you need a refill on your cardiac medications before your next appointment, please call your pharmacy*   Lab Work: If you have labs (blood work) drawn today and your tests are completely normal, you will receive your results only by: Marland Kitchen MyChart Message (if you have MyChart) OR . A paper copy in the mail If you have any lab test that is abnormal or we need to change your treatment, we will call you to review the results.   Follow-Up: At [facility], you and your health needs are our priority.  As part of our continuing mission to provide you with exceptional heart care, we have created designated Provider Care Teams.  These Care Teams include your primary Cardiologist (physician) and Advanced Practice Providers (APPs -  Physician Assistants and Nurse Practitioners) who all work together to provide you with the care you need, when you need it.  We recommend signing up for the patient portal called "MyChart".  Sign up information is provided on this After Visit Summary.  MyChart is used to connect with patients for Virtual Visits (Telemedicine).  Patients are able to view lab/test results, encounter notes, upcoming appointments, etc.  Non-urgent messages can be sent to your provider as well.   To learn more about what you can do with MyChart, go to [email or web address].    Your next appointment:   Your physician wants you to follow-up in: [place] will receive a reminder letter in the mail two months in advance. If you don't receive a letter, please call our office to schedule the follow-up appointment.   Your physician wants you to follow-up in: [place] will receive a reminder letter in the mail two months in advance. If you don't receive a letter, please call our office to schedule the follow-up appointment.

## 2020-02-04 ENCOUNTER — Ambulatory Visit: Payer: Medicare Other | Admitting: Internal Medicine

## 2020-02-04 ENCOUNTER — Encounter: Payer: Self-pay | Admitting: Internal Medicine

## 2020-02-04 ENCOUNTER — Other Ambulatory Visit: Payer: Self-pay

## 2020-02-04 VITALS — BP 106/66 | HR 70 | Temp 97.0°F | Ht 66.0 in | Wt 134.2 lb

## 2020-02-04 DIAGNOSIS — K253 Acute gastric ulcer without hemorrhage or perforation: Secondary | ICD-10-CM | POA: Diagnosis not present

## 2020-02-04 DIAGNOSIS — R112 Nausea with vomiting, unspecified: Secondary | ICD-10-CM | POA: Diagnosis not present

## 2020-02-04 DIAGNOSIS — F1210 Cannabis abuse, uncomplicated: Secondary | ICD-10-CM

## 2020-02-04 DIAGNOSIS — R1115 Cyclical vomiting syndrome unrelated to migraine: Secondary | ICD-10-CM | POA: Diagnosis not present

## 2020-02-04 DIAGNOSIS — K219 Gastro-esophageal reflux disease without esophagitis: Secondary | ICD-10-CM | POA: Diagnosis not present

## 2020-02-04 NOTE — Progress Notes (Signed)
HISTORY OF PRESENT ILLNESS:  [name] is a 64 y.o. female with multiple medical problems including IBS, GERD, bipolar disorder, chronic headaches, prior stroke, cyclic vomiting, marijuana dependence, and history of paraesophageal hernia with repair (mesh and gastropexy November 2015 elsewhere).  Colonoscopy December 2016 with adenomatous colon polyps and sessile serrated polyps.  She was last evaluated in this office December 18, 2019 regarding recurrent problems with nausea and vomiting requiring emergency room visits.  The feeling at that time was cyclic vomiting secondary to chronic cannabis.  Possibly exacerbated by narcotics.  We prescribed pantoprazole and set her up for upper endoscopy.  Examination revealed that her prior hernia repair was intact.  She was found to have gastric erosions.  She was told to continue pantoprazole, stop smoking cannabis, eat smaller meals, and follow-up at this time.  Patient did have a short hospital stay with chest pain.  This was found to be noncardiac.  She does have left ventricular hypertrophy.  She is a rambling historian but as best I can tell she had 2 episodes of self-limited vomiting which did not require hospital visit.  She does continue on her PPI.  She typically uses oral Phenergan for nausea.  Zofran as backup.  She is also been using Carafate, which she thinks helps.  She does tell me that she has been under a great deal of stress and feels this exacerbates her symptoms.  REVIEW OF SYSTEMS:  All non-GI ROS negative unless otherwise stated in the HPI except for anxiety, arthritis, depression, hearing problems, heart murmur, night sweats, sleeping problems, ankle swelling, shortness of breath  Past Medical History:  Diagnosis Date  . Abdominal pain    chronic, recurrent  . Allergy   . Bipolar affective disorder ([place])   . Chronic headaches   . Chronic pain   . Colon polyps    adenomatous  . Cyclical vomiting syndrome   . Depression   .  Diverticulosis   . Endometriosis   . Fibrocystic breast   . GERD (gastroesophageal reflux disease)   . Hiatal hernia   . IBS (irritable bowel syndrome)   . Marijuana dependence ([place])   . Status post repair of paraesophageal diaphragmatic hernia   . Stroke [facility]) 12/2013    Past Surgical History:  Procedure Laterality Date  . ABDOMINAL HYSTERECTOMY  1990  . BREAST LUMPECTOMY Bilateral   . FOOT FRACTURE SURGERY Right    x 3  . HIATAL HERNIA REPAIR    . NISSEN FUNDOPLICATION  123456   hiatal hernia repair  . TOE SURGERY Left     Social History [name]  reports that she has never smoked. She has never used smokeless tobacco. She reports current drug use. Drug: Marijuana. She reports that she does not drink alcohol.  family history includes Brain cancer in her mother; Breast cancer in her paternal grandmother and sister; Diabetes in her mother and sister.  Allergies  Allergen Reactions  . Dust Mite Extract   . Lyrica [Pregabalin]     Cause bladder issues        PHYSICAL EXAMINATION: Vital signs: BP 106/66 (BP Location: Left Arm, Patient Position: Sitting, Cuff Size: Normal)   Pulse 70   Temp (!) 97 F (36.1 C)   Ht 5\' 6"  (1.676 m)   Wt 134 lb 4 oz (60.9 kg)   SpO2 98%   BMI 21.67 kg/m   Constitutional: Thin, chronically ill-appearing, no acute distress Psychiatric: alert and oriented x3, cooperative Eyes: extraocular  movements intact, anicteric, conjunctiva pink Mouth: oral pharynx moist, no lesions Neck: supple no lymphadenopathy Cardiovascular: heart regular rate and rhythm, no murmur Lungs: clear to auscultation bilaterally Abdomen: soft, nontender, nondistended, no obvious ascites, no peritoneal signs, normal bowel sounds, no organomegaly.  No succussion splash Rectal: Omitted Extremities: no clubbing or cyanosis.  1+ lower extremity edema bilaterally Skin: no lesions on visible extremities Neuro: No focal deficits.  Cranial nerves  intact  ASSESSMENT:  1.  Cyclic vomiting.  Felt secondary to chronic cannabis use.  Problems may be exacerbated by narcotics. 2.  History of paraesophageal hernia repair.  Intact on recent endoscopy 3.  GERD.  On pantoprazole 4.  Gastric erosions.  On pantoprazole 5.  History of adenomatous colon polyps.  Last examination December 2016.  Due for follow-up December 2021 6.  Multiple medical problems   PLAN:  1.  Continue PPI 2.  Reflux precautions 3.  Stop smoking cannabis 4.  Smaller meals 5.  Continue symptomatic therapies 6.  Surveillance colonoscopy later this year.  She is aware 7.  Resume general medical care with PCP and multiple specialists. A total time of 30 minutes was spent preparing to see the patient, reviewing test results, obtaining history, performing comprehensive physical exam, counseling the patient regarding her above listed issues, ordering medications, and documenting clinical information in the health record.

## 2020-02-04 NOTE — Patient Instructions (Signed)
Please follow up as needed 

## 2020-02-17 ENCOUNTER — Ambulatory Visit: Payer: Medicare Other | Admitting: Neurology

## 2020-02-24 ENCOUNTER — Emergency Department (HOSPITAL_COMMUNITY): Payer: Medicare Other

## 2020-02-24 ENCOUNTER — Emergency Department (HOSPITAL_COMMUNITY)
Admission: EM | Admit: 2020-02-24 | Discharge: 2020-02-24 | Disposition: A | Discharge status: Home / Self Care | Payer: Medicare Other | Attending: Emergency Medicine | Admitting: Emergency Medicine

## 2020-02-24 ENCOUNTER — Other Ambulatory Visit: Payer: Self-pay

## 2020-02-24 ENCOUNTER — Encounter (HOSPITAL_COMMUNITY): Payer: Self-pay

## 2020-02-24 DIAGNOSIS — Y998 Other external cause status: Secondary | ICD-10-CM | POA: Diagnosis not present

## 2020-02-24 DIAGNOSIS — F1210 Cannabis abuse, uncomplicated: Secondary | ICD-10-CM | POA: Insufficient documentation

## 2020-02-24 DIAGNOSIS — Z8673 Personal history of transient ischemic attack (TIA), and cerebral infarction without residual deficits: Secondary | ICD-10-CM | POA: Diagnosis not present

## 2020-02-24 DIAGNOSIS — Y93K9 Activity, other involving animal care: Secondary | ICD-10-CM | POA: Insufficient documentation

## 2020-02-24 DIAGNOSIS — Z7982 Long term (current) use of aspirin: Secondary | ICD-10-CM | POA: Insufficient documentation

## 2020-02-24 DIAGNOSIS — Y929 Unspecified place or not applicable: Secondary | ICD-10-CM | POA: Insufficient documentation

## 2020-02-24 DIAGNOSIS — S61411A Laceration without foreign body of right hand, initial encounter: Secondary | ICD-10-CM | POA: Diagnosis not present

## 2020-02-24 DIAGNOSIS — W540XXA Bitten by dog, initial encounter: Secondary | ICD-10-CM | POA: Diagnosis not present

## 2020-02-24 DIAGNOSIS — T148XXA Other injury of unspecified body region, initial encounter: Secondary | ICD-10-CM

## 2020-02-24 DIAGNOSIS — S6991XA Unspecified injury of right wrist, hand and finger(s), initial encounter: Secondary | ICD-10-CM | POA: Diagnosis present

## 2020-02-24 DIAGNOSIS — Z79899 Other long term (current) drug therapy: Secondary | ICD-10-CM | POA: Diagnosis not present

## 2020-02-24 MED ORDER — HYDROCODONE-ACETAMINOPHEN 5-325 MG PO TABS
1.0000 | ORAL_TABLET | Freq: Once | ORAL | Status: AC
  Administered 2020-02-24: 1 via ORAL
  Filled 2020-02-24: qty 1

## 2020-02-24 MED ORDER — BUPIVACAINE HCL (PF) 0.5 % IJ SOLN
10.0000 mL | Freq: Once | INTRAMUSCULAR | Status: DC
  Filled 2020-02-24: qty 30

## 2020-02-24 MED ORDER — AMOXICILLIN-POT CLAVULANATE 875-125 MG PO TABS: 1.0000 | ORAL_TABLET | Freq: Two times a day (BID) | ORAL | 0 refills | Status: AC

## 2020-02-24 NOTE — ED Triage Notes (Signed)
Patient was bitten by a Pit bull that was at the shelter. Patient states the dog did have a rabies vaccination.

## 2020-02-24 NOTE — ED Provider Notes (Signed)
[place] DEPT Provider Note   CSN: [id number] Arrival date & time: 02/24/20  [id number]     History Chief Complaint  Patient presents with  . Animal Bite    [name] is a 64 y.o. female.  HPI HPI Comments: [name] is a 64 y.o. female who presents to the Emergency Department complaining of a dog bite.  Patient was taking a new dog to a foster home and the dog bit her bilateral hands.  She notes significant pain in digits 2 and 3 of her right hand.  She notes 2 small puncture wounds to the dorsum of the left hand and left wrist.  She additionally complains of a 1 cm laceration to the right second digit dorsally just proximal to the PIP.  She has 2 additional 1 cm lacerations to the distal end of the right third digit.  No active bleeding.  Pain worse with palpation and movement.  She has not taken anything for pain.  She denies numbness, weakness, chest pain, shortness of breath.    Past Medical History:  Diagnosis Date  . Abdominal pain    chronic, recurrent  . Allergy   . Bipolar affective disorder ([place])   . Chronic headaches   . Chronic pain   . Colon polyps    adenomatous  . Cyclical vomiting syndrome   . Depression   . Diverticulosis   . Endometriosis   . Fibrocystic breast   . GERD (gastroesophageal reflux disease)   . Hiatal hernia   . IBS (irritable bowel syndrome)   . Marijuana dependence ([place])   . Status post repair of paraesophageal diaphragmatic hernia   . Stroke [facility]) 12/2013    Patient Active Problem List   Diagnosis Date Noted  . Chest pain 01/07/2020  . Chronic pain 01/07/2020  . Hypokalemia   . LVH (left ventricular hypertrophy)   . Hyperlipidemia 12/03/2019  . Atypical chest pain 12/03/2019  . Nonspecific abnormal electrocardiogram (ECG) (EKG) 12/03/2019  . Cyclical vomiting with nausea 07/17/2015  . Cyclic vomiting syndrome 04/03/2014  . CVA (cerebral infarction) 01/29/2014  . Polysubstance abuse  ([place]) 01/29/2014  . Intractable nausea and vomiting 11/16/2013  . N&V (nausea and vomiting) 02/03/2013  . Bipolar affective disorder ([place])   . GERD (gastroesophageal reflux disease)   . 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 HEMATEMESIS 11/15/2006  . ENDOMETRIOSIS 11/15/2006  . DEPRESSION 09/05/2006  . MIGRAINE HEADACHE 09/05/2006  . IBS 09/05/2006  . CERVICALGIA 09/05/2006    Past Surgical History:  Procedure Laterality Date  . ABDOMINAL HYSTERECTOMY  1990  . BREAST LUMPECTOMY Bilateral   . FOOT FRACTURE SURGERY Right    x 3  . HIATAL HERNIA REPAIR    . NISSEN FUNDOPLICATION  123456   hiatal hernia repair  . TOE SURGERY Left      OB History   No obstetric history on file.     Family History  Problem Relation Age of Onset  . Brain cancer Mother   . Diabetes Mother   . Breast cancer Sister   . Breast cancer Paternal Grandmother   . Diabetes Sister        x 2  . Colon cancer Neg Hx     Social History   Tobacco Use  . Smoking status: Never Smoker  . Smokeless tobacco: Never Used  Substance Use Topics  . Alcohol use: No    Alcohol/week: 0.0 standard drinks  . Drug use: Yes    Types:  Marijuana    Comment: smokes daily     Home Medications Prior to Admission medications   Medication Sig Start Date End Date Taking? Authorizing Provider  aspirin EC 325 MG tablet Take 325 mg by mouth daily.    [provider]  cyclobenzaprine (FLEXERIL) 10 MG tablet Take 10 mg by mouth 3 (three) times daily as needed for muscle spasms.    [provider]  diclofenac sodium (VOLTAREN) 1 % GEL Apply 1 g topically 3 (three) times daily as needed (back and elbow pain).     [provider]  dicyclomine (BENTYL) 20 MG tablet Take 1 tablet (20 mg total) by mouth 2 (two) times daily. 10/22/19   Lacretia Leigh, MD  ezetimibe-simvastatin (VYTORIN) 10-20 MG tablet Take 1 tablet by mouth at bedtime. 12/11/19 12/10/20  Garvin Fila, MD  furosemide (LASIX) 40 MG  tablet Take 40 mg by mouth as needed for fluid.  12/13/18   [provider]  gabapentin (NEURONTIN) 300 MG capsule Take 900 mg by mouth 3 (three) times daily as needed (nerve pain).  04/23/15   [provider]  HYDROcodone-acetaminophen (NORCO/VICODIN) 5-325 MG tablet Take 1 tablet by mouth 4 (four) times daily as needed for moderate pain.  11/29/19   [provider]  lamoTRIgine (LAMICTAL) 150 MG tablet Take 150 mg by mouth at bedtime.     [provider]  metoprolol tartrate (LOPRESSOR) 25 MG tablet Take 0.5 tablets (12.5 mg total) by mouth 2 (two) times daily. 01/08/20 01/07/21  Furth, Cadence H, PA-C  ondansetron (ZOFRAN ODT) 4 MG disintegrating tablet Take 1 tablet (4 mg total) by mouth every 8 (eight) hours as needed for nausea. 04/07/17   Larene Pickett, PA-C  pantoprazole (PROTONIX) 40 MG tablet Take 1 tablet (40 mg total) by mouth daily. 12/14/15   Irene Shipper, MD  perphenazine (TRILAFON) 4 MG tablet Take 4 mg by mouth 3 (three) times daily.     [provider]  potassium chloride SA (K-DUR,KLOR-CON) 20 MEQ tablet Take 2 tablets (40 mEq total) by mouth 2 (two) times daily for 10 days. 02/08/19 11/20/28  Kinnie Feil, PA-C  pramipexole (MIRAPEX) 1.5 MG tablet Take 1.5 mg by mouth daily.    [provider]  promethazine (PHENERGAN) 25 MG suppository INSERT 1 SUPPOSITORY INTO RECTUM EVERY 8 HOURS AS NEEDED FOR NAUSEA OR VOMITING 12/14/15   Irene Shipper, MD  sucralfate (CARAFATE) 1 g tablet Take 1 g by mouth 4 (four) times daily -  with meals and at bedtime.    [provider]  tiZANidine (ZANAFLEX) 4 MG tablet Take 4 mg by mouth 3 (three) times daily. 12/13/18   [provider]  topiramate (TOPAMAX) 25 MG tablet Take 1 tablet (25 mg total) by mouth at bedtime. 12/11/19 12/10/20  Garvin Fila, MD  venlafaxine XR (EFFEXOR-XR) 75 MG 24 hr capsule Take 75 mg by mouth at bedtime.     [provider]    Allergies     Dust mite extract and Lyrica [pregabalin]  Review of Systems   Review of Systems  Respiratory: Negative for shortness of breath.   Cardiovascular: Negative for chest pain.  Musculoskeletal: Positive for arthralgias and myalgias.  Skin: Positive for color change and wound.  Neurological: Negative for weakness and numbness.    Physical Exam Updated Vital Signs BP 125/75 (BP Location: Right Arm)   Pulse 67   Temp 98.9 F (37.2 C) (Oral)   Resp 16  Ht 5\' 7"  (1.702 m)   Wt 61.2 kg   SpO2 97%   BMI 21.14 kg/m   Physical Exam Vitals and nursing note reviewed.  Constitutional:      General: She is in acute distress.     Appearance: Normal appearance. She is not ill-appearing, toxic-appearing or diaphoretic.  HENT:     Head: Normocephalic and atraumatic.     Right Ear: External ear normal.     Left Ear: External ear normal.     Nose: Nose normal.     Mouth/Throat:     Mouth: Mucous membranes are moist.  Eyes:     Extraocular Movements: Extraocular movements intact.  Cardiovascular:     Rate and Rhythm: Normal rate and regular rhythm.     Pulses: Normal pulses.     Heart sounds: Normal heart sounds. No murmur. No friction rub. No gallop.   Pulmonary:     Effort: Pulmonary effort is normal. No respiratory distress.     Breath sounds: Normal breath sounds. No stridor. No wheezing, rhonchi or rales.  Abdominal:     General: Abdomen is flat.  Musculoskeletal:        General: Normal range of motion.     Cervical back: Normal range of motion and neck supple. No tenderness.  Skin:    General: Skin is warm and dry.     Findings: Laceration present.     Comments: 1 cm horizontal laceration noted to the right second digit dorsally just proximal to the PIP.  2 additional 1 cm lacerations noted to the distal tip of right third digit.  2 puncture wounds noted to the dorsum of the left hand and left wrist.  Small abrasion noted to the volar aspect of the right wrist.  No active  bleeding at any of the prior mentioned sites.  Significant TTP noted to the right second and third digits.  Neurological:     General: No focal deficit present.     Mental Status: She is alert and oriented to person, place, and time.  Psychiatric:        Mood and Affect: Mood normal.        Behavior: Behavior normal.    ED Results / Procedures / Treatments   Labs (all labs ordered are listed, but only abnormal results are displayed) Labs Reviewed - No data to display  EKG None  Radiology DG Hand Complete Right  Result Date: 02/24/2020 CLINICAL DATA:  64 year old female status post dog bite. Lacerations to the 2nd and 3rd digits. EXAM: RIGHT HAND - COMPLETE 3+ VIEW COMPARISON:  Right hand series 09/24/2016. FINDINGS: Distal radius and ulna appear intact. Normal carpal bone alignment and joint spaces. Metacarpals and phalanges appear intact. Bone mineralization is within normal limits for age. No discrete soft tissue injury. No radiopaque foreign body identified. IMPRESSION: Negative. Electronically Signed   By: Genevie Ann M.D.   On: 02/24/2020 11:41    Procedures Procedures   Medications Ordered in ED Medications  bupivacaine (MARCAINE) 0.5 % injection 10 mL (has no administration in time range)  HYDROcodone-acetaminophen (NORCO/VICODIN) 5-325 MG per tablet 1 tablet (1 tablet Oral Given 02/24/20 1200)    ED Course  I have reviewed the triage vital signs and the nursing notes.  Pertinent labs & imaging results that were available during my care of the patient were reviewed by me and considered in my medical decision making (see chart for details).    MDM Rules/Calculators/A&P  Patient is a pleasant 64 year old female who presents with dog bite to the right hand and multiple small lacerations.  Bleeding is well controlled this time.  Patient states that this dog is a foster and is up-to-date on its vaccinations.  She actually works at the [employer].  I  obtained an x-ray of the right hand and there were no bony abnormalities or foreign bodies visualized.  There are multiple puncture sites noted on the right hand with multiple lacerations to the second and third digits of the right hand.  I performed a digital block of the second and third digits with bupivacaine.  I then irrigated the wounds with a significant amount of normal saline and scrubbed him with a iodine scrub brush.  Patient tolerated this procedure well.  I am prescribing her 7 days of Augmentin twice daily.  She can take Tylenol and ibuprofen as needed for pain.  She understands to complete these antibiotics in their entirety.  She understands return to the emergency department with any new or worsening symptoms or signs of infection.  She verbalized understanding of the above plan and was amicable discharge.  Vital signs stable.  Patient discharged to home/self care.  Condition at discharge: Stable  Note: Portions of this report may have been transcribed using voice recognition software. Every effort was made to ensure accuracy; however, inadvertent computerized transcription errors may be present.    Final Clinical Impression(s) / ED Diagnoses Final diagnoses:  Animal bite  Laceration of right hand without foreign body, initial encounter    Rx / DC Orders ED Discharge Orders         Ordered    amoxicillin-clavulanate (AUGMENTIN) 875-125 MG tablet  2 times daily     02/24/20 1243           Rayna Sexton, PA-C 02/24/20 1250    Lacretia Leigh, MD 02/25/20 [phone]

## 2020-02-24 NOTE — Discharge Instructions (Signed)
Per our discussion, I am prescribing you Augmentin which is an antibiotic you are going to take twice a day for the next 7 days for this dog bite to prevent infection.  Is important that you take this antibiotic in its entirety.  Please do not stop taking this early.  It might benefit you to take this antibiotic with a small amount of food at each dose.  This can help prevent an upset stomach.  Please do not hesitate to return to the emergency department with any new or worsening symptoms or signs of infection.  It was a pleasure to meet you.

## 2020-03-09 ENCOUNTER — Other Ambulatory Visit: Payer: Self-pay | Admitting: Neurology

## 2020-04-01 ENCOUNTER — Telehealth: Payer: Self-pay | Admitting: Cardiovascular Disease

## 2020-04-01 NOTE — Telephone Encounter (Signed)
I called patient 04/01/20 to schedule follow up visit from [facility] recall list. The patient didn't answer so I left message for patient to return call in order to get that appointment scheduled.

## 2020-06-01 ENCOUNTER — Other Ambulatory Visit: Payer: Self-pay

## 2020-06-01 MED ORDER — METOPROLOL TARTRATE 25 MG PO TABS: 12.5000 mg | ORAL_TABLET | Freq: Two times a day (BID) | ORAL | 2 refills | Status: DC

## 2020-06-01 NOTE — Telephone Encounter (Signed)
Rx(s) sent to pharmacy electronically.  

## 2020-07-12 NOTE — Progress Notes (Signed)
Cardiology Clinic Note   Patient Name: [name] Date of Encounter: 07/13/2020  Primary Care Provider:  Sonia Side., FNP Primary Cardiologist:  Quay Burow, MD  Patient Profile    [name] 64 year old female presents the clinic today for follow-up of her atypical chest pain.  Past Medical History    Past Medical History:  Diagnosis Date  . Abdominal pain    chronic, recurrent  . Allergy   . Bipolar affective disorder ([place])   . Chronic headaches   . Chronic pain   . Colon polyps    adenomatous  . Cyclical vomiting syndrome   . Depression   . Diverticulosis   . Endometriosis   . Fibrocystic breast   . GERD (gastroesophageal reflux disease)   . Hiatal hernia   . IBS (irritable bowel syndrome)   . Marijuana dependence ([place])   . Status post repair of paraesophageal diaphragmatic hernia   . Stroke [facility]) 12/2013   Past Surgical History:  Procedure Laterality Date  . ABDOMINAL HYSTERECTOMY  1990  . BREAST LUMPECTOMY Bilateral   . FOOT FRACTURE SURGERY Right    x 3  . HIATAL HERNIA REPAIR    . NISSEN FUNDOPLICATION  81/1914   hiatal hernia repair  . TOE SURGERY Left     Allergies  Allergies  Allergen Reactions  . Dust Mite Extract   . Lyrica [Pregabalin]     Cause bladder issues     History of Present Illness    [name] is a past medical history of atypical chest pain, neck pain, back pain, prior CVA, tobacco use, hyperlipidemia and psychiatric issues.  She also has a family history of heart disease.  She was last seen by Dr. Gwenlyn Found on 01/21/2020.  During that time she had indicated she had exertional chest pressure since August.  She was hospitalized for 1 day 01/06/2020 for chest discomfort and ruled out for MI.  A coronary CTA showed a calcium score of 0 and no evidence of coronary artery disease.  Cardiac MRI did show basal septal hypertrophy.  She had anterior motion of the mitral valve and peak gradient of 60 mmHg with Valsalva.   This suggested dynamic outflow obstruction.  She was placed on low-dose beta-blocker by Dr. Meda Coffee.  She presents the clinic today for follow-up evaluation and states she feels well.  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  She indicates that occasionally when she has to carry animals she does get short of breath.  We reviewed her coronary CTA, echocardiogram and cardiac MRI.  She is tolerating her metoprolol well.  I reassured her that her shortness of breath was most likely related to her COPD.  I have asked her to increase her physical activity outside of work and eat a low-sodium heart healthy diet.  We will have her return to clinic in 6 months and I will give her the salty 6 diet sheet.  Today she denies chest pain, shortness of breath, lower extremity edema, fatigue, palpitations, melena, hematuria, hemoptysis, diaphoresis, weakness, presyncope, syncope, orthopnea, and PND.  Home Medications    Prior to Admission medications   Medication Sig Start Date End Date Taking? Authorizing Provider  aspirin EC 325 MG tablet Take 325 mg by mouth daily.    [provider]  cyclobenzaprine (FLEXERIL) 10 MG tablet Take 10 mg by mouth 3 (three) times daily as needed for muscle spasms.    [provider]  diclofenac sodium (VOLTAREN) 1 % GEL Apply 1 g topically 3 (three) times daily as needed (back and elbow pain).     [provider]  dicyclomine (BENTYL) 20 MG tablet Take 1 tablet (20 mg total) by mouth 2 (two) times daily. 10/22/19   Lacretia Leigh, MD  ezetimibe-simvastatin (VYTORIN) 10-20 MG tablet Take 1 tablet by mouth at bedtime. 12/11/19 12/10/20  Garvin Fila, MD  furosemide (LASIX) 40 MG tablet Take 40 mg by mouth as needed for fluid.  12/13/18   [provider]  gabapentin (NEURONTIN) 300 MG capsule Take 900 mg by mouth 3 (three) times daily as needed (nerve pain).  04/23/15   [provider]  HYDROcodone-acetaminophen  (NORCO/VICODIN) 5-325 MG tablet Take 1 tablet by mouth 4 (four) times daily as needed for moderate pain.  11/29/19   [provider]  lamoTRIgine (LAMICTAL) 150 MG tablet Take 150 mg by mouth at bedtime.     [provider]  metoprolol tartrate (LOPRESSOR) 25 MG tablet Take 0.5 tablets (12.5 mg total) by mouth 2 (two) times daily. 06/01/20 06/01/21  Lorretta Harp, MD  ondansetron (ZOFRAN ODT) 4 MG disintegrating tablet Take 1 tablet (4 mg total) by mouth every 8 (eight) hours as needed for nausea. 04/07/17   Larene Pickett, PA-C  pantoprazole (PROTONIX) 40 MG tablet Take 1 tablet (40 mg total) by mouth daily. 12/14/15   Irene Shipper, MD  perphenazine (TRILAFON) 4 MG tablet Take 4 mg by mouth 3 (three) times daily.     [provider]  potassium chloride SA (K-DUR,KLOR-CON) 20 MEQ tablet Take 2 tablets (40 mEq total) by mouth 2 (two) times daily for 10 days. 02/08/19 11/20/28  Kinnie Feil, PA-C  pramipexole (MIRAPEX) 1.5 MG tablet Take 1.5 mg by mouth daily.    [provider]  promethazine (PHENERGAN) 25 MG suppository INSERT 1 SUPPOSITORY INTO RECTUM EVERY 8 HOURS AS NEEDED FOR NAUSEA OR VOMITING 12/14/15   Irene Shipper, MD  sucralfate (CARAFATE) 1 g tablet Take 1 g by mouth 4 (four) times daily -  with meals and at bedtime.    [provider]  tiZANidine (ZANAFLEX) 4 MG tablet Take 4 mg by mouth 3 (three) times daily. 12/13/18   [provider]  topiramate (TOPAMAX) 25 MG tablet TAKE 1 TABLET BY MOUTH EVERYDAY AT BEDTIME 03/09/20   Garvin Fila, MD  venlafaxine XR (EFFEXOR-XR) 75 MG 24 hr capsule Take 75 mg by mouth at bedtime.     [provider]    Family History    Family History  Problem Relation Age of Onset  . Brain cancer Mother   . Diabetes Mother   . Breast cancer Sister   . Breast cancer Paternal Grandmother   . Diabetes Sister        x 2  . Colon cancer Neg Hx    She indicated that the status of her mother is  unknown. She indicated that the status of her paternal grandmother is unknown. She indicated that the status of her neg hx is unknown.  Social History    Social History   Socioeconomic History  . Marital status: Married    Spouse name: Not on file  . Number of children: 3  . Years of education: Not on file  . Highest education level: Not on file  Occupational History  . Occupation: disabled  Tobacco Use  . Smoking status: Never Smoker  . Smokeless tobacco: Never Used  Vaping Use  . Vaping Use: Never used  Substance and Sexual Activity  . Alcohol use: No    Alcohol/week: 0.0 standard drinks  . Drug use: Yes    Types: Marijuana    Comment: smokes daily   . Sexual activity: Not on file  Other Topics Concern  . Not on file  Social History Narrative  . Not on file   Social Determinants of Health   Financial Resource Strain:   . Difficulty of Paying Living Expenses: Not on file  Food Insecurity:   . Worried About Charity fundraiser in the Last Year: Not on file  . Ran Out of Food in the Last Year: Not on file  Transportation Needs:   . Lack of Transportation (Medical): Not on file  . Lack of Transportation (Non-Medical): Not on file  Physical Activity:   . Days of Exercise per Week: Not on file  . Minutes of Exercise per Session: Not on file  Stress:   . Feeling of Stress : Not on file  Social Connections:   . Frequency of Communication with Friends and Family: Not on file  . Frequency of Social Gatherings with Friends and Family: Not on file  . Attends Religious Services: Not on file  . Active Member of Clubs or Organizations: Not on file  . Attends Archivist Meetings: Not on file  . Marital Status: Not on file  Intimate Partner Violence:   . Fear of Current or Ex-Partner: Not on file  . Emotionally Abused: Not on file  . Physically Abused: Not on file  . Sexually Abused: Not on file     Review of Systems    General:  No chills, fever, night sweats  or weight changes.  Cardiovascular:  No chest pain, dyspnea on exertion, edema, orthopnea, palpitations, paroxysmal nocturnal dyspnea. Dermatological: No rash, lesions/masses Respiratory: No cough, dyspnea Urologic: No hematuria, dysuria Abdominal:   No nausea, vomiting, diarrhea, bright red blood per rectum, melena, or hematemesis Neurologic:  No visual changes, wkns, changes in mental status. All other systems reviewed and are otherwise negative except as noted above.  Physical Exam    VS:  BP 128/70   Pulse 66   Ht 5\' 6"  (1.676 m)   Wt 143 lb 9.6 oz (65.1 kg)   SpO2 95%   BMI 23.18 kg/m  , BMI Body mass index is 23.18 kg/m. GEN: Well nourished, well developed, in no acute distress. HEENT: normal. Neck: Supple, no JVD, carotid bruits, or masses. Cardiac: RRR, no murmurs, rubs, or gallops. No clubbing, cyanosis, edema.  Radials/DP/PT 2+ and equal bilaterally.  Respiratory:  Respirations regular and unlabored, clear to auscultation bilaterally. GI: Soft, nontender, nondistended, BS + x 4. MS: no deformity or atrophy. Skin: warm and dry, no rash. Neuro:  Strength and sensation are intact. Psych: Normal affect.  Accessory Clinical Findings    Recent Labs: 01/06/2020: Hemoglobin 13.9; Magnesium 1.7; Platelets 278 01/07/2020: ALT 13; BUN 17; Creatinine, Ser 0.66; Potassium 3.7; Sodium 141; TSH 2.547   Recent Lipid Panel    Component Value Date/Time   CHOL 129 01/08/2020 1145   CHOL 170 12/03/2019 1001   TRIG 98 01/08/2020 1145   HDL 42 01/08/2020 1145   HDL 62 12/03/2019 1001   CHOLHDL 3.1 01/08/2020 1145   VLDL 20 01/08/2020 1145   LDLCALC 67 01/08/2020 1145   LDLCALC 85 12/03/2019 1001    ECG personally reviewed by me today-none today.  Coronary CTA 01/07/2020 IMPRESSION: 1.  Coronary calcium score of 0. This was 0 percentile for age and sex matched control.  2. Normal coronary origin with right dominance.  3. CAD-RADS 0. No evidence of CAD (0%). Consider  non-atherosclerotic causes of chest pain.  4. There is moderate concentric left ventricular hypertrophy and systolic anterior motion of the anterior mitral valve leaflet (SAM).  Cardiac MRI 01/08/2020 IMPRESSION: 1. Asymmetric hypertrophy measuring up to 60mm in basal inferoseptum (9 mm in posterior wall), consistent with hypertrophic cardiomyopathy  2. Inferior RV insertion site late gadolinium enhancement, which is commonly seen in HCM. LGE accounts for <1% of total myocardial mass  3. 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 LVOT gradient was not measured  4.  Normal LV size with hyperdynamic systolic function (EF 16%)  5.  Normal RV size and systolic function   Assessment & Plan   1.  Atypical chest pain-no chest pain today.  Admitted with chest pain 01/06/2020.  Underwent coronary CTA which showed a calcium score of 0 and no evidence of CAD.  Cardiac MRI showed basal septal hypertrophy.  She had anterior motion of the mitral valve and peak gradient of 60 mmHg with Valsalva.  This suggested dynamic outflow obstruction. Continue aspirin, metoprolol Heart healthy low-sodium diet-salty 6 given Increase physical activity as tolerated  Hyperlipidemia-LDL 67 on 01/08/2020 Continue ezetimibe/simvastatin Heart healthy low-sodium diet-salty 6 given Increase physical activity as tolerated  Disposition: Follow-up with Dr. Gwenlyn Found or me in 6 months.   Jossie Ng. Caylan Chenard NP-C    07/13/2020, 10:17 AM [address] Office [phone] Fax [phone]  Notice: This dictation was prepared with Dragon dictation along with smaller phrase technology. Any transcriptional errors that result from this process are unintentional and may not be corrected upon review.

## 2020-07-13 ENCOUNTER — Encounter: Payer: Self-pay | Admitting: General Practice

## 2020-07-13 ENCOUNTER — Ambulatory Visit: Payer: Medicare Other | Admitting: General Practice

## 2020-07-13 ENCOUNTER — Other Ambulatory Visit: Payer: Self-pay

## 2020-07-13 VITALS — BP 128/70 | HR 66 | Ht 66.0 in | Wt 143.6 lb

## 2020-07-13 DIAGNOSIS — R0789 Other chest pain: Secondary | ICD-10-CM | POA: Diagnosis not present

## 2020-07-13 DIAGNOSIS — E782 Mixed hyperlipidemia: Secondary | ICD-10-CM | POA: Diagnosis not present

## 2020-07-13 NOTE — Patient Instructions (Signed)
Medication Instructions:  The current medical regimen is effective;  continue present plan and medications as directed. Please refer to the Current Medication list given to you today.  *If you need a refill on your cardiac medications before your next appointment, please call your pharmacy*  Special Instructions PLEASE READ AND FOLLOW SALTY 6-ATTACHED  Follow-Up: Your next appointment:  6 month(s)   In Person with Quay Burow, MD -[place], FNP-C  At [facility], you and your health needs are our priority.  As part of our continuing mission to provide you with exceptional heart care, we have created designated Provider Care Teams.  These Care Teams include your primary Cardiologist (physician) and Advanced Practice Providers (APPs -  Physician Assistants and Nurse Practitioners) who all work together to provide you with the care you need, when you need it.

## 2020-08-27 ENCOUNTER — Emergency Department (HOSPITAL_COMMUNITY)
Admission: EM | Admit: 2020-08-27 | Discharge: 2020-08-27 | Disposition: A | Discharge status: Home / Self Care | Payer: Medicare Other | Attending: Emergency Medicine | Admitting: Emergency Medicine

## 2020-08-27 ENCOUNTER — Emergency Department (HOSPITAL_COMMUNITY): Payer: Medicare Other

## 2020-08-27 ENCOUNTER — Encounter (HOSPITAL_COMMUNITY): Payer: Self-pay | Admitting: *Deleted

## 2020-08-27 ENCOUNTER — Other Ambulatory Visit: Payer: Self-pay

## 2020-08-27 DIAGNOSIS — R1110 Vomiting, unspecified: Secondary | ICD-10-CM | POA: Diagnosis present

## 2020-08-27 DIAGNOSIS — R197 Diarrhea, unspecified: Secondary | ICD-10-CM | POA: Diagnosis not present

## 2020-08-27 DIAGNOSIS — R1084 Generalized abdominal pain: Secondary | ICD-10-CM | POA: Diagnosis not present

## 2020-08-27 DIAGNOSIS — Z7982 Long term (current) use of aspirin: Secondary | ICD-10-CM | POA: Insufficient documentation

## 2020-08-27 DIAGNOSIS — R1115 Cyclical vomiting syndrome unrelated to migraine: Secondary | ICD-10-CM | POA: Diagnosis not present

## 2020-08-27 LAB — HEPATIC FUNCTION PANEL
ALT: 18 U/L (ref 0–44)
AST: 23 U/L (ref 15–41)
Albumin: 4.1 g/dL (ref 3.5–5.0)
Alkaline Phosphatase: 75 U/L (ref 38–126)
Bilirubin, Direct: <0.1 mg/dL (ref 0.0–0.2)
Total Bilirubin: 0.7 mg/dL (ref 0.3–1.2)
Total Protein: 6.9 g/dL (ref 6.5–8.1)

## 2020-08-27 LAB — LIPASE, BLOOD: Lipase: 19 U/L (ref 11–51)

## 2020-08-27 LAB — BASIC METABOLIC PANEL
Anion gap: 14 (ref 5–15)
BUN: 18 mg/dL (ref 8–23)
CO2: 24 mmol/L (ref 22–32)
Calcium: 9.5 mg/dL (ref 8.9–10.3)
Chloride: 102 mmol/L (ref 98–111)
Creatinine, Ser: 0.64 mg/dL (ref 0.44–1.00)
GFR, Estimated: >60 mL/min (ref >60)
Glucose, Bld: 156 mg/dL — ABNORMAL HIGH (ref 70–99)
Potassium: 2.7 mmol/L — CL (ref 3.5–5.1)
Sodium: 140 mmol/L (ref 135–145)

## 2020-08-27 LAB — CBC
HCT: 39.3 % (ref 36.0–46.0)
Hemoglobin: 13.4 g/dL (ref 12.0–15.0)
MCH: 33.2 pg (ref 26.0–34.0)
MCHC: 34.1 g/dL (ref 30.0–36.0)
MCV: 97.3 fL (ref 80.0–100.0)
Platelets: 310 10*3/uL (ref 150–400)
RBC: 4.04 MIL/uL (ref 3.87–5.11)
RDW: 12.4 % (ref 11.5–15.5)
WBC: 13.7 10*3/uL — ABNORMAL HIGH (ref 4.0–10.5)
nRBC: 0 % (ref 0.0–0.2)

## 2020-08-27 LAB — TROPONIN I (HIGH SENSITIVITY)
Troponin I (High Sensitivity): 8 ng/L (ref <18)
Troponin I (High Sensitivity): 12 ng/L (ref <18)

## 2020-08-27 LAB — MAGNESIUM: Magnesium: 1.5 mg/dL — ABNORMAL LOW (ref 1.7–2.4)

## 2020-08-27 MED ORDER — ALUM & MAG HYDROXIDE-SIMETH 200-200-20 MG/5ML PO SUSP
30.0000 mL | Freq: Once | ORAL | Status: AC
  Administered 2020-08-27: 30 mL via ORAL
  Filled 2020-08-27: qty 30

## 2020-08-27 MED ORDER — MAGNESIUM SULFATE IN D5W 1-5 GM/100ML-% IV SOLN
1.0000 g | Freq: Once | INTRAVENOUS | Status: AC
  Administered 2020-08-27: 1 g via INTRAVENOUS
  Filled 2020-08-27: qty 100

## 2020-08-27 MED ORDER — LIDOCAINE VISCOUS HCL 2 % MT SOLN
15.0000 mL | Freq: Once | OROMUCOSAL | Status: AC
  Administered 2020-08-27: 15 mL via ORAL
  Filled 2020-08-27: qty 15

## 2020-08-27 MED ORDER — DROPERIDOL 2.5 MG/ML IJ SOLN
2.5000 mg | Freq: Once | INTRAMUSCULAR | Status: AC
  Administered 2020-08-27: 2.5 mg via INTRAVENOUS
  Filled 2020-08-27: qty 2

## 2020-08-27 MED ORDER — POTASSIUM CHLORIDE 10 MEQ/100ML IV SOLN
10.0000 meq | INTRAVENOUS | Status: AC
  Administered 2020-08-27 (×2): 10 meq via INTRAVENOUS
  Filled 2020-08-27 (×2): qty 100

## 2020-08-27 MED ORDER — LACTATED RINGERS IV BOLUS
1000.0000 mL | Freq: Once | INTRAVENOUS | Status: AC
  Administered 2020-08-27: 1000 mL via INTRAVENOUS

## 2020-08-27 NOTE — ED Triage Notes (Signed)
Pt says that she woke up around 0130 this morning with vomiting. She is having epigastric pain and pain under the left breast area.

## 2020-08-27 NOTE — ED Provider Notes (Signed)
[facility] EMERGENCY DEPARTMENT Provider Note   CSN: [id number] Arrival date & time: 08/27/20  0530     History Chief Complaint  Patient presents with  . Abdominal Pain    [name] is a 64 y.o. female.  The history is provided by the patient.  Emesis Severity:  Severe Duration:  2 days Timing:  Constant Quality:  Stomach contents Progression:  Worsening Chronicity:  Recurrent Recent urination:  Normal Relieved by:  Nothing Worsened by:  Nothing Associated symptoms: abdominal pain and diarrhea   Associated symptoms: no arthralgias, no chills, no cough, no fever and no sore throat        Past Medical History:  Diagnosis Date  . Abdominal pain    chronic, recurrent  . Allergy   . Bipolar affective disorder ([place])   . Chronic headaches   . Chronic pain   . Colon polyps    adenomatous  . Cyclical vomiting syndrome   . Depression   . Diverticulosis   . Endometriosis   . Fibrocystic breast   . GERD (gastroesophageal reflux disease)   . Hiatal hernia   . IBS (irritable bowel syndrome)   . Marijuana dependence ([place])   . Status post repair of paraesophageal diaphragmatic hernia   . Stroke [facility]) 12/2013    Patient Active Problem List   Diagnosis Date Noted  . Chest pain 01/07/2020  . Chronic pain 01/07/2020  . Hypokalemia   . LVH (left ventricular hypertrophy)   . Hyperlipidemia 12/03/2019  . Atypical chest pain 12/03/2019  . Nonspecific abnormal electrocardiogram (ECG) (EKG) 12/03/2019  . Cyclical vomiting with nausea 07/17/2015  . Cyclic vomiting syndrome 04/03/2014  . CVA (cerebral infarction) 01/29/2014  . Polysubstance abuse ([place]) 01/29/2014  . Intractable nausea and vomiting 11/16/2013  . N&V (nausea and vomiting) 02/03/2013  . Bipolar affective disorder ([place])   . GERD (gastroesophageal reflux disease)   . 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 HEMATEMESIS 11/15/2006  . ENDOMETRIOSIS 11/15/2006  . DEPRESSION 09/05/2006    . MIGRAINE HEADACHE 09/05/2006  . IBS 09/05/2006  . CERVICALGIA 09/05/2006    Past Surgical History:  Procedure Laterality Date  . ABDOMINAL HYSTERECTOMY  1990  . BREAST LUMPECTOMY Bilateral   . FOOT FRACTURE SURGERY Right    x 3  . HIATAL HERNIA REPAIR    . NISSEN FUNDOPLICATION  [id number]/9528   hiatal hernia repair  . TOE SURGERY Left      OB History   No obstetric history on file.     Family History  Problem Relation Age of Onset  . Brain cancer Mother   . Diabetes Mother   . Breast cancer Sister   . Breast cancer Paternal Grandmother   . Diabetes Sister        x 2  . Colon cancer Neg Hx     Social History   Tobacco Use  . Smoking status: Never Smoker  . Smokeless tobacco: Never Used  Vaping Use  . Vaping Use: Never used  Substance Use Topics  . Alcohol use: No    Alcohol/week: 0.0 standard drinks  . Drug use: Yes    Types: Marijuana    Comment: smokes daily     Home Medications Prior to Admission medications   Medication Sig Start Date End Date Taking? Authorizing Provider  aspirin EC 325 MG tablet Take 325 mg by mouth daily.    [provider]  cyclobenzaprine (FLEXERIL) 10 MG tablet Take 10 mg by mouth 3 (three)  times daily as needed for muscle spasms.    [provider]  diclofenac sodium (VOLTAREN) 1 % GEL Apply 1 g topically 3 (three) times daily as needed (back and elbow pain).     [provider]  dicyclomine (BENTYL) 20 MG tablet Take 1 tablet (20 mg total) by mouth 2 (two) times daily. 10/22/19   Lacretia Leigh, MD  ezetimibe-simvastatin (VYTORIN) 10-20 MG tablet Take 1 tablet by mouth at bedtime. 12/11/19 12/10/20  Garvin Fila, MD  furosemide (LASIX) 40 MG tablet Take 40 mg by mouth as needed for fluid.  12/13/18   [provider]  gabapentin (NEURONTIN) 300 MG capsule Take 900 mg by mouth 3 (three) times daily as needed (nerve pain).  04/23/15   [provider]  HYDROcodone-acetaminophen (NORCO/VICODIN)  5-325 MG tablet Take 1 tablet by mouth 4 (four) times daily as needed for moderate pain.  11/29/19   [provider]  lamoTRIgine (LAMICTAL) 150 MG tablet Take 150 mg by mouth at bedtime.     [provider]  metoprolol tartrate (LOPRESSOR) 25 MG tablet Take 0.5 tablets (12.5 mg total) by mouth 2 (two) times daily. 06/01/20 06/01/21  Lorretta Harp, MD  ondansetron (ZOFRAN ODT) 4 MG disintegrating tablet Take 1 tablet (4 mg total) by mouth every 8 (eight) hours as needed for nausea. 04/07/17   Larene Pickett, PA-C  pantoprazole (PROTONIX) 40 MG tablet Take 1 tablet (40 mg total) by mouth daily. 12/14/15   Irene Shipper, MD  perphenazine (TRILAFON) 4 MG tablet Take 4 mg by mouth 3 (three) times daily.     [provider]  potassium chloride SA (K-DUR,KLOR-CON) 20 MEQ tablet Take 2 tablets (40 mEq total) by mouth 2 (two) times daily for 10 days. 02/08/19 11/20/28  Kinnie Feil, PA-C  pramipexole (MIRAPEX) 1.5 MG tablet Take 1.5 mg by mouth daily.    [provider]  promethazine (PHENERGAN) 25 MG suppository INSERT 1 SUPPOSITORY INTO RECTUM EVERY 8 HOURS AS NEEDED FOR NAUSEA OR VOMITING 12/14/15   Irene Shipper, MD  sucralfate (CARAFATE) 1 g tablet Take 1 g by mouth 4 (four) times daily -  with meals and at bedtime.    [provider]  tiZANidine (ZANAFLEX) 4 MG tablet Take 4 mg by mouth 3 (three) times daily. 12/13/18   [provider]  topiramate (TOPAMAX) 25 MG tablet TAKE 1 TABLET BY MOUTH EVERYDAY AT BEDTIME 03/09/20   Garvin Fila, MD  venlafaxine XR (EFFEXOR-XR) 75 MG 24 hr capsule Take 75 mg by mouth at bedtime.     [provider]    Allergies    Dust mite extract and Lyrica [pregabalin]  Review of Systems   Review of Systems  Constitutional: Positive for appetite change and fatigue. Negative for chills and fever.  HENT: Negative for ear pain and sore throat.   Eyes: Negative for pain and visual disturbance.  Respiratory:  Negative for cough and shortness of breath.   Cardiovascular: Negative for chest pain and palpitations.  Gastrointestinal: Positive for abdominal pain, diarrhea, nausea and vomiting.  Genitourinary: Negative for dysuria and hematuria.  Musculoskeletal: Negative for arthralgias and back pain.  Skin: Negative for color change and rash.  Neurological: Negative for seizures and syncope.  All other systems reviewed and are negative.   Physical Exam Updated Vital Signs BP (!) 120/49 (BP Location: Left Arm)   Pulse 85   Temp 98.9 F (37.2 C) (Oral)   Resp 14  SpO2 97%   Physical Exam Vitals and nursing note reviewed.  Constitutional:      Appearance: She is not ill-appearing, toxic-appearing or diaphoretic.     Comments: Appears uncomfortable  HENT:     Head: Normocephalic and atraumatic.     Mouth/Throat:     Mouth: Mucous membranes are dry.     Pharynx: Oropharynx is clear.  Eyes:     Conjunctiva/sclera: Conjunctivae normal.     Pupils: Pupils are equal, round, and reactive to light.  Cardiovascular:     Rate and Rhythm: Normal rate and regular rhythm.     Heart sounds: No murmur heard.  No gallop.   Pulmonary:     Effort: Pulmonary effort is normal. No respiratory distress.     Breath sounds: Normal breath sounds.  Abdominal:     General: Bowel sounds are normal. There is no distension.     Palpations: Abdomen is soft.     Tenderness: There is generalized abdominal tenderness. There is no right CVA tenderness, left CVA tenderness, guarding or rebound.  Musculoskeletal:     Cervical back: Neck supple.  Skin:    General: Skin is warm and dry.  Neurological:     Mental Status: She is alert. Mental status is at baseline.     ED Results / Procedures / Treatments   Labs (all labs ordered are listed, but only abnormal results are displayed) Labs Reviewed  BASIC METABOLIC PANEL - Abnormal; Notable for the following components:      Result Value   Potassium 2.7 (*)     Glucose, Bld 156 (*)    All other components within normal limits  CBC - Abnormal; Notable for the following components:   WBC 13.7 (*)    All other components within normal limits  MAGNESIUM - Abnormal; Notable for the following components:   Magnesium 1.5 (*)    All other components within normal limits  URINE CULTURE  LIPASE, BLOOD  HEPATIC FUNCTION PANEL  URINALYSIS, ROUTINE W REFLEX MICROSCOPIC  TROPONIN I (HIGH SENSITIVITY)  TROPONIN I (HIGH SENSITIVITY)    EKG EKG Interpretation  Date/Time:  Thursday August 27 2020 09:22:24 EDT Ventricular Rate:  82 PR Interval:  228 QRS Duration: 102 QT Interval:  530 QTC Calculation: 620 R Axis:   50 Text Interpretation: Sinus or ectopic atrial rhythm Probable anteroseptal infarct, old ST depr, consider ischemia, inferior leads Prolonged QT interval No STEMI Confirmed by Nanda Quinton [phone]) on 08/27/2020 9:25:52 AM   Radiology DG Chest 2 View  Result Date: 08/27/2020 CLINICAL DATA:  Left-sided chest pain EXAM: CHEST - 2 VIEW COMPARISON:  01/06/2020 FINDINGS: Normal heart size and mediastinal contours. No acute infiltrate or edema. No effusion or pneumothorax. No acute osseous findings. IMPRESSION: Stable exam.  No evidence of active disease. Electronically Signed   By: Monte Fantasia M.D.   On: 08/27/2020 06:26    Procedures Procedures (including critical care time)  Medications Ordered in ED Medications  lactated ringers bolus 1,000 mL (0 mLs Intravenous Stopped 08/27/20 1544)  droperidol (INAPSINE) 2.5 MG/ML injection 2.5 mg (2.5 mg Intravenous Given 08/27/20 0837)  potassium chloride 10 mEq in 100 mL IVPB (0 mEq Intravenous Stopped 08/27/20 1042)  magnesium sulfate IVPB 1 g 100 mL (0 g Intravenous Stopped 08/27/20 1230)  alum & mag hydroxide-simeth (MAALOX/MYLANTA) 200-200-20 MG/5ML suspension 30 mL (30 mLs Oral Given 08/27/20 1047)    And  lidocaine (XYLOCAINE) 2 % viscous mouth solution 15 mL (15 mLs Oral Given 08/27/20  1047)    ED Course  I have reviewed the triage vital signs and the nursing notes.  Pertinent labs & imaging results that were available during my care of the patient were reviewed by me and considered in my medical decision making (see chart for details).    MDM Rules/Calculators/A&P                          The patient is a 64yo female, PMH as above who presents to the ED for abd pain, n/v/d.  On my initial evaluation, the patient is hemodynamically stable, afebrile, nontoxic but uncomfortable-appearing. Physical exam remarkable for diffusely tender abdomen with no guarding or rebound tenderness.  Differentials considered include ACS, pancreatitis, cholecystitis, cyclic vomiting from cannabis hyperemesis syndrome, IBS flare, GERD, PUD. I am most concerned for cyclic vomiting episode from cannabis hyperemesis syndrome.  Screening labs obtained prior to my evaluation remarkable for hypokalemia to 2.7, which patient has had before with similar vomiting episodes.  EKG with prolonged PR interval, anterior Q waves, and ST and T wave changes in inferior leads. No STEMI criteria.    Patient provided IV fluid bolus for dry mucous membranes, IV potassium for hypokalemia, and IV droperidol for symptoms. A repeat EKG resulted with ongoing concern for anterior infarct due to the Q waves in the anterior leads, which I do not disagree with.  She does have some ongoing ST depression in the lateral and inferior leads, and this is unchanged from prior EKGs such as those obtained in January and March of this year.  This repeat EKG also expressed some concern for prolonged QTc of 620 ms.  What I see when I look at this EKG is I see the T waves and the next P waves are quite close together, and when I manually calculate the QTc interval, I get an interval of 468 ms, which is unchanged from her initial EKG today.  I suspect that the EKG machine is not differentiating between the T wave and the next P wave and is  calculating the interval from the QRS complex to the next P wave to falsely prolong the QTc interval. Further labs remarkable for hypomagnesemia, normal LFTs, normal lipase, normal troponin.  CXR unremarkable.  On reevaluation, patient reports significant improvement in pain but reports some ongoing nausea, and is requesting GI cocktail of Maalox and lidocaine.  GI cocktail provided.  On reevaluation, patient tolerating p.o. intake and reports symptom improvement and is requesting discharge.  Feel it is stable for patient to be discharged with reassuring work-up and improvement in symptoms with stable vitals and reassuring labs.  Advised patient of concern for vomiting syndrome. Advised treatment of symptoms with continuing home medications and cessation of marijuana. Recommended follow-up with PCP in the next couple days. Strict return precautions provided. Patient discharged in stable condition.   The care of this patient was overseen by Dr. Laverta Baltimore, who agreed with evaluation and plan of care.   Final Clinical Impression(s) / ED Diagnoses Final diagnoses:  Cyclic vomiting syndrome      Launa Flight, MD 08/27/20 1601    Margette Fast, MD 08/28/20 [phone]

## 2020-08-27 NOTE — ED Triage Notes (Signed)
Pt arrives from home via [facility]. Pt has hx of Cyclic vomiting syndrome. 2 hours ago abdominal pain, vomiting, feels similar to cyclic vomiting syndrome. Left sided chest pain, feels different than her abdominal pain, started after n/v. 12 lead WNL. 177/93, hr 56-70.  Took phenergan and zofran prior to ems arrival without relief.

## 2020-11-04 ENCOUNTER — Ambulatory Visit
Admission: RE | Admit: 2020-11-04 | Discharge: 2020-11-04 | Disposition: A | Discharge status: Home / Self Care | Payer: Medicare Other | Source: Ambulatory Visit | Attending: Registered Nurse | Admitting: Registered Nurse

## 2020-11-04 ENCOUNTER — Other Ambulatory Visit: Payer: Self-pay | Admitting: Registered Nurse

## 2020-11-04 DIAGNOSIS — W19XXXA Unspecified fall, initial encounter: Secondary | ICD-10-CM

## 2020-11-04 DIAGNOSIS — R52 Pain, unspecified: Secondary | ICD-10-CM

## 2020-11-20 ENCOUNTER — Emergency Department (HOSPITAL_COMMUNITY)
Admission: EM | Admit: 2020-11-20 | Discharge: 2020-11-21 | Disposition: A | Discharge status: Home / Self Care | Payer: Medicare Other | Attending: Emergency Medicine | Admitting: Emergency Medicine

## 2020-11-20 ENCOUNTER — Other Ambulatory Visit: Payer: Self-pay

## 2020-11-20 DIAGNOSIS — R197 Diarrhea, unspecified: Secondary | ICD-10-CM | POA: Diagnosis not present

## 2020-11-20 DIAGNOSIS — R011 Cardiac murmur, unspecified: Secondary | ICD-10-CM | POA: Diagnosis not present

## 2020-11-20 DIAGNOSIS — E860 Dehydration: Secondary | ICD-10-CM

## 2020-11-20 DIAGNOSIS — R112 Nausea with vomiting, unspecified: Secondary | ICD-10-CM

## 2020-11-20 DIAGNOSIS — E876 Hypokalemia: Secondary | ICD-10-CM

## 2020-11-20 DIAGNOSIS — R1115 Cyclical vomiting syndrome unrelated to migraine: Secondary | ICD-10-CM

## 2020-11-20 DIAGNOSIS — R1110 Vomiting, unspecified: Secondary | ICD-10-CM | POA: Insufficient documentation

## 2020-11-20 DIAGNOSIS — R1084 Generalized abdominal pain: Secondary | ICD-10-CM | POA: Diagnosis present

## 2020-11-20 DIAGNOSIS — K219 Gastro-esophageal reflux disease without esophagitis: Secondary | ICD-10-CM | POA: Insufficient documentation

## 2020-11-20 DIAGNOSIS — Z7982 Long term (current) use of aspirin: Secondary | ICD-10-CM | POA: Diagnosis not present

## 2020-11-20 LAB — CBC WITH DIFFERENTIAL/PLATELET
Abs Immature Granulocytes: 0.03 10*3/uL (ref 0.00–0.07)
Basophils Absolute: 0 10*3/uL (ref 0.0–0.1)
Basophils Relative: 0 %
Eosinophils Absolute: 0 10*3/uL (ref 0.0–0.5)
Eosinophils Relative: 0 %
HCT: 40.3 % (ref 36.0–46.0)
Hemoglobin: 14.2 g/dL (ref 12.0–15.0)
Immature Granulocytes: 0 %
Lymphocytes Relative: 16 %
Lymphs Abs: 1.6 10*3/uL (ref 0.7–4.0)
MCH: 33 pg (ref 26.0–34.0)
MCHC: 35.2 g/dL (ref 30.0–36.0)
MCV: 93.7 fL (ref 80.0–100.0)
Monocytes Absolute: 0.4 10*3/uL (ref 0.1–1.0)
Monocytes Relative: 5 %
Neutro Abs: 7.6 10*3/uL (ref 1.7–7.7)
Neutrophils Relative %: 79 %
Platelets: 313 10*3/uL (ref 150–400)
RBC: 4.3 MIL/uL (ref 3.87–5.11)
RDW: 12.4 % (ref 11.5–15.5)
WBC: 9.6 10*3/uL (ref 4.0–10.5)
nRBC: 0 % (ref 0.0–0.2)

## 2020-11-20 LAB — LACTIC ACID, PLASMA: Lactic Acid, Venous: 1.8 mmol/L (ref 0.5–1.9)

## 2020-11-20 LAB — COMPREHENSIVE METABOLIC PANEL
ALT: 30 U/L (ref 0–44)
AST: 34 U/L (ref 15–41)
Albumin: 3.8 g/dL (ref 3.5–5.0)
Alkaline Phosphatase: 87 U/L (ref 38–126)
Anion gap: 18 — ABNORMAL HIGH (ref 5–15)
BUN: 9 mg/dL (ref 8–23)
CO2: 26 mmol/L (ref 22–32)
Calcium: 9.1 mg/dL (ref 8.9–10.3)
Chloride: 96 mmol/L — ABNORMAL LOW (ref 98–111)
Creatinine, Ser: 0.61 mg/dL (ref 0.44–1.00)
GFR, Estimated: >60 mL/min (ref >60)
Glucose, Bld: 117 mg/dL — ABNORMAL HIGH (ref 70–99)
Potassium: 2.5 mmol/L — CL (ref 3.5–5.1)
Sodium: 140 mmol/L (ref 135–145)
Total Bilirubin: 0.5 mg/dL (ref 0.3–1.2)
Total Protein: 7 g/dL (ref 6.5–8.1)

## 2020-11-20 LAB — LIPASE, BLOOD: Lipase: 17 U/L (ref 11–51)

## 2020-11-20 MED ORDER — LIDOCAINE VISCOUS HCL 2 % MT SOLN
15.0000 mL | Freq: Once | OROMUCOSAL | Status: AC
  Administered 2020-11-20: 15 mL via ORAL
  Filled 2020-11-20: qty 15

## 2020-11-20 MED ORDER — POTASSIUM CHLORIDE 10 MEQ/100ML IV SOLN
10.0000 meq | INTRAVENOUS | Status: AC
  Administered 2020-11-20 (×3): 10 meq via INTRAVENOUS
  Filled 2020-11-20 (×3): qty 100

## 2020-11-20 MED ORDER — POTASSIUM CHLORIDE CRYS ER 20 MEQ PO TBCR
80.0000 meq | EXTENDED_RELEASE_TABLET | Freq: Once | ORAL | Status: AC
  Administered 2020-11-20: 80 meq via ORAL
  Filled 2020-11-20: qty 4

## 2020-11-20 MED ORDER — MORPHINE SULFATE (PF) 4 MG/ML IV SOLN
4.0000 mg | Freq: Once | INTRAVENOUS | Status: AC
  Administered 2020-11-20: 4 mg via INTRAVENOUS
  Filled 2020-11-20: qty 1

## 2020-11-20 MED ORDER — LACTATED RINGERS IV BOLUS
1000.0000 mL | Freq: Once | INTRAVENOUS | Status: AC
  Administered 2020-11-20: 1000 mL via INTRAVENOUS

## 2020-11-20 MED ORDER — PRAMIPEXOLE DIHYDROCHLORIDE 1.5 MG PO TABS
1.5000 mg | ORAL_TABLET | Freq: Once | ORAL | Status: AC
  Administered 2020-11-20: 1.5 mg via ORAL
  Filled 2020-11-20: qty 1

## 2020-11-20 MED ORDER — METOCLOPRAMIDE HCL 5 MG/ML IJ SOLN
10.0000 mg | Freq: Once | INTRAMUSCULAR | Status: AC
  Administered 2020-11-20: 10 mg via INTRAVENOUS
  Filled 2020-11-20: qty 2

## 2020-11-20 MED ORDER — HALOPERIDOL LACTATE 5 MG/ML IJ SOLN
2.0000 mg | Freq: Once | INTRAMUSCULAR | Status: AC
  Administered 2020-11-20: 2 mg via INTRAVENOUS
  Filled 2020-11-20: qty 1

## 2020-11-20 MED ORDER — ALUM & MAG HYDROXIDE-SIMETH 200-200-20 MG/5ML PO SUSP
30.0000 mL | Freq: Once | ORAL | Status: AC
  Administered 2020-11-20: 30 mL via ORAL
  Filled 2020-11-20: qty 30

## 2020-11-20 NOTE — ED Triage Notes (Signed)
Pt arrives to ED BIB [facility] coming from doctors office due to N/V/D x3 days. Per EMS pt has been having emesis without relief and has not been able to take her meds or keep anything down. Per Pt this has happened in the past and it is triggered by stress. Per EMS pt was given 8mg  sublingual zofran but threw it back up. 4mg  IV Zofran was administered by EMS. Pt A/O X4 EDP at bedisde upon pt's arrival.  T 97.8 BP 140/80 R 18 HR 80 O2 99% RA CBG 150

## 2020-11-20 NOTE — ED Notes (Signed)
Pt had apple juice, was able to tolerate it and keep it down.

## 2020-11-20 NOTE — ED Provider Notes (Signed)
[facility] EMERGENCY DEPARTMENT Provider Note   CSN: [id number] Arrival date & time: 11/20/20  1622     History No chief complaint on file.   [name] is a 65 y.o. female.  Patient is a 65 year old female with a history of cyclical vomiting taking syndrome, polysubstance abuse, chronic pain on daily hydrocodone, GERD and prior Nissen fundoplication and abdominal hysterectomy who is presenting today with 3 days of persistent vomiting, watery diarrhea and generalized abdominal pain.  Patient reports the last time she had similar symptoms was approximately 3 months ago.  She reports this feels like her cyclical vomiting and is usually set off by stress which she has been under a lot lately.  She complains of pain throughout her abdomen and her whole body from vomiting numerous times per hour for the last 3 days.  She reports what she is vomiting is mostly stomach acid with some streaks of blood occasionally.  She does take 325 mg of aspirin but no other anticoagulation.  She has not had fever, cough or shortness of breath.  Patient was at a clinic prior to coming here and they gave her sublingual Zofran which she vomited and EMS gave her 4 mg of Zofran and 200 mL of fluid and patient was vomiting while I walked into the room.  She does report smoking marijuana but states that it has been a week because she knows that can irritate her symptoms.  She also states that for the last 2 days she has not been able to keep any of her hydrocodone down and she knows that is adding on her symptoms.  She denies any urinary symptoms.  The history is provided by the patient.       Past Medical History:  Diagnosis Date   Abdominal pain    chronic, recurrent   Allergy    Bipolar affective disorder (HCC)    Chronic headaches    Chronic pain    Colon polyps    adenomatous   Cyclical vomiting syndrome    Depression    Diverticulosis    Endometriosis    Fibrocystic  breast    GERD (gastroesophageal reflux disease)    Hiatal hernia    IBS (irritable bowel syndrome)    Marijuana dependence ([place])    Status post repair of paraesophageal diaphragmatic hernia    Stroke ([place]) 12/2013    Patient Active Problem List   Diagnosis Date Noted   Chest pain 01/07/2020   Chronic pain 01/07/2020   Hypokalemia    LVH (left ventricular hypertrophy)    Hyperlipidemia 12/03/2019   Atypical chest pain 12/03/2019   Nonspecific abnormal electrocardiogram (ECG) (EKG) [id number]   Cyclical vomiting with nausea [id number]   Cyclic vomiting syndrome 04/03/2014   CVA (cerebral infarction) 01/29/2014   Polysubstance abuse ([place]) 01/29/2014   Intractable nausea and vomiting 11/16/2013   N&V (nausea and vomiting) 02/03/2013   Bipolar affective disorder ([place])    GERD (gastroesophageal reflux disease)    COCAINE ABUSE, 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     Family History  Problem Relation Age of Onset   Brain cancer Mother    Diabetes Mother    Breast cancer Sister    Breast cancer Paternal Grandmother    Diabetes Sister        x 2   Colon cancer Neg Hx     Social History   Tobacco Use   Smoking status: Never Smoker   Smokeless tobacco: Never Used  Vaping Use   Vaping Use: Never used  Substance Use Topics   Alcohol use: No    Alcohol/week: 0.0 standard drinks   Drug use: Yes    Types: Marijuana    Comment: smokes daily     Home Medications Prior to Admission  medications   Medication Sig Start Date End Date Taking? Authorizing Provider  aspirin EC 325 MG tablet Take 325 mg by mouth daily.    [provider]  cyclobenzaprine (FLEXERIL) 10 MG tablet Take 10 mg by mouth 3 (three) times daily as needed for muscle spasms.    [provider]  diclofenac sodium (VOLTAREN) 1 % GEL Apply 1 g topically 3 (three) times daily as needed (back and elbow pain).     [provider]  dicyclomine (BENTYL) 20 MG tablet Take 1 tablet (20 mg total) by mouth 2 (two) times daily. 10/22/19   Lacretia Leigh, MD  ezetimibe-simvastatin (VYTORIN) 10-20 MG tablet Take 1 tablet by mouth at bedtime. 12/11/19 12/10/20  Garvin Fila, MD  furosemide (LASIX) 40 MG tablet Take 40 mg by mouth as needed for fluid.  12/13/18   [provider]  gabapentin (NEURONTIN) 300 MG capsule Take 900 mg by mouth 3 (three) times daily as needed (nerve pain).  04/23/15   [provider]  HYDROcodone-acetaminophen (NORCO/VICODIN) 5-325 MG tablet Take 1 tablet by mouth 4 (four) times daily as needed for moderate pain.  11/29/19   [provider]  lamoTRIgine (LAMICTAL) 150 MG tablet Take 150 mg by mouth at bedtime.     [provider]  metoprolol tartrate (LOPRESSOR) 25 MG tablet Take 0.5 tablets (12.5 mg total) by mouth 2 (two) times daily. 06/01/20 06/01/21  Lorretta Harp, MD  ondansetron (ZOFRAN ODT) 4 MG disintegrating tablet Take 1 tablet (4 mg total) by mouth every 8 (eight) hours as needed for nausea. 04/07/17   Larene Pickett, PA-C  pantoprazole (PROTONIX) 40 MG tablet Take 1 tablet (40 mg total) by mouth daily. 12/14/15   Irene Shipper, MD  perphenazine (TRILAFON) 4 MG tablet Take 4 mg by mouth 3 (three) times daily.     [provider]  potassium chloride SA (K-DUR,KLOR-CON) 20 MEQ tablet Take 2 tablets (40 mEq total) by mouth 2 (two) times daily for 10 days. 02/08/19 11/20/28  Kinnie Feil, PA-C  pramipexole (MIRAPEX) 1.5 MG  tablet Take 1.5 mg by mouth daily.    [provider]  promethazine (PHENERGAN) 25 MG suppository INSERT 1 SUPPOSITORY INTO RECTUM EVERY 8 HOURS AS NEEDED FOR NAUSEA OR VOMITING 12/14/15   Irene Shipper, MD  sucralfate (CARAFATE) 1 g tablet Take 1 g by mouth 4 (four) times daily -  with meals and at bedtime.    [provider]  tiZANidine (ZANAFLEX) 4 MG tablet Take 4 mg by mouth 3 (three) times daily. 12/13/18   [provider]  topiramate (TOPAMAX) 25 MG tablet TAKE 1 TABLET BY MOUTH EVERYDAY AT BEDTIME  03/09/20   Garvin Fila, MD  venlafaxine XR (EFFEXOR-XR) 75 MG 24 hr capsule Take 75 mg by mouth at bedtime.     [provider]    Allergies    Dust mite extract and Lyrica [pregabalin]  Review of Systems   Review of Systems  All other systems reviewed and are negative.   Physical Exam Updated Vital Signs BP 139/71 (BP Location: Right Arm)    Pulse 76    Temp 99 F (37.2 C) (Oral)    Resp 16    SpO2 99%   Physical Exam Vitals and nursing note reviewed.  Constitutional:      General: She is not in acute distress.    Appearance: She is well-developed, normal weight and well-nourished.     Comments: Heaving and retching upon my arrival  HENT:     Head: Normocephalic and atraumatic.     Mouth/Throat:     Mouth: Mucous membranes are dry.  Eyes:     Extraocular Movements: EOM normal.     Pupils: Pupils are equal, round, and reactive to light.  Cardiovascular:     Rate and Rhythm: Normal rate and regular rhythm.     Pulses: Intact distal pulses.     Heart sounds: Murmur heard.   Crescendo systolic murmur is present with a grade of 3/6. No friction rub.  Pulmonary:     Effort: Pulmonary effort is normal.     Breath sounds: Normal breath sounds. No wheezing or rales.  Abdominal:     General: Bowel sounds are normal. There is no distension.     Palpations: Abdomen is soft.     Tenderness: There is abdominal tenderness. There is no guarding or  rebound.     Comments: Mild diffuse abdominal pain without any rebound or guarding.  Decreased bowel sounds  Musculoskeletal:        General: No tenderness. Normal range of motion.     Right lower leg: No edema.     Left lower leg: No edema.     Comments: No edema  Skin:    General: Skin is warm and dry.     Findings: No rash.     Comments: Multiple lesions over the upper arms that are scarred and healed.  No evidence of open draining lesions or erythema  Neurological:     General: No focal deficit present.     Mental Status: She is alert and oriented to person, place, and time. Mental status is at baseline.     Cranial Nerves: No cranial nerve deficit.  Psychiatric:        Mood and Affect: Mood and affect normal.        Behavior: Behavior normal.     Comments: Calm and cooperative     ED Results / Procedures / Treatments   Labs (all labs ordered are listed, but only abnormal results are displayed) Labs Reviewed  COMPREHENSIVE METABOLIC PANEL - Abnormal; Notable for the following components:      Result Value   Potassium 2.5 (*)    Chloride 96 (*)    Glucose, Bld 117 (*)    Anion gap 18 (*)    All other components within normal limits  CBC WITH DIFFERENTIAL/PLATELET  LIPASE, BLOOD  LACTIC ACID, PLASMA  POTASSIUM    EKG EKG Interpretation  Date/Time:  Friday November 20 2020 18:47:48 EST Ventricular Rate:  67 PR Interval:    QRS Duration: 94 QT Interval:  434 QTC Calculation: 459  R Axis:   44 Text Interpretation: Sinus rhythm Probable anterior infarct, age indeterminate more pronounced ST depression inferiorly compared to prior Confirmed by Blanchie Dessert [phone]) on 11/20/2020 6:51:22 PM   Radiology No results found.  Procedures Procedures (including critical care time)  Medications Ordered in ED Medications  lactated ringers bolus 1,000 mL (has no administration in time range)  metoCLOPramide (REGLAN) injection 10 mg (has no administration in time range)   morphine 4 MG/ML injection 4 mg (has no administration in time range)    ED Course  I have reviewed the triage vital signs and the nursing notes.  Pertinent labs & imaging results that were available during my care of the patient were reviewed by me and considered in my medical decision making (see chart for details).    MDM Rules/Calculators/A&P                          Patient is a 65 year old female presenting today with 3 days of nausea vomiting and watery stools.  Low suspicion for obstruction at this time.  Patient does have a history of cyclical vomiting syndrome as well as cannabis hyperemesis syndrome.  Patient reports she has not smoked marijuana for the last week and more likely to be cyclical vomiting.  Patient also has not had her hydrocodone in the last 2 days and most likely has a component of opiate withdrawal as well.  Vital signs are reassuring and lower suspicion for an acute infectious cause.  Patient does appear mildly dehydrated and will check labs to ensure no electrolyte abnormalities.  She was given IV fluid, pain and nausea medication.  Based on exam and history lower concern at this time for peritonitis, appendicitis, cholecystitis, hepatitis or pancreatitis.  Patient's symptoms are not classic for UTI or renal stone.  6:55 PM On reevaluation patient has had no further vomiting.  She does complain of some ongoing mild nausea but reports in general she is feeling better.  CBC normal today, CMP with significant hypokalemia of 2.5 which is most likely from all the vomiting but normal creatinine and bicarb.  Chloride is also low at 96.  Lipase and lactate are within normal limits.  We will replace patient's potassium with both IV and oral potassium.  She is requesting a GI cocktail at this time as well as her Mirapex for her restless leg syndrome.  She overall appears improved.   10:21 PM Pt has been feeling better.  Now tolerating po's. Finishing 3rd round of  potassium.  10:36 PM Pt just went to the bathroom and had an episode of diarrhea and 1 episode of vomiting.  Will treat with haldol.  EKG with normal QT.  Repeat K at midnight.    Final Clinical Impression(s) / ED Diagnoses Final diagnoses:  None    Rx / DC Orders ED Discharge Orders    None       Blanchie Dessert, MD 11/20/20 2319

## 2020-11-21 LAB — POTASSIUM: Potassium: 3.8 mmol/L (ref 3.5–5.1)

## 2020-11-21 MED ORDER — ONDANSETRON 4 MG PO TBDP: 4.0000 mg | ORAL_TABLET | Freq: Three times a day (TID) | ORAL | 0 refills | Status: DC | PRN

## 2020-11-21 MED ORDER — PROMETHAZINE HCL 25 MG RE SUPP: RECTAL | 0 refills | Status: AC

## 2020-11-21 MED ORDER — PROMETHAZINE HCL 25 MG PO TABS: 25.0000 mg | ORAL_TABLET | Freq: Four times a day (QID) | ORAL | 0 refills | Status: DC | PRN

## 2020-12-10 ENCOUNTER — Telehealth: Payer: Self-pay | Admitting: Neurology

## 2020-12-10 ENCOUNTER — Other Ambulatory Visit: Payer: Self-pay

## 2020-12-10 ENCOUNTER — Encounter: Payer: Self-pay | Admitting: Neurology

## 2020-12-10 ENCOUNTER — Ambulatory Visit: Payer: Medicare Other | Admitting: Neurology

## 2020-12-10 VITALS — BP 145/64 | HR 75 | Ht 66.0 in | Wt 129.2 lb

## 2020-12-10 DIAGNOSIS — H539 Unspecified visual disturbance: Secondary | ICD-10-CM | POA: Diagnosis not present

## 2020-12-10 DIAGNOSIS — R202 Paresthesia of skin: Secondary | ICD-10-CM | POA: Diagnosis not present

## 2020-12-10 MED ORDER — ASPIRIN EC 81 MG PO TBEC: 81.0000 mg | DELAYED_RELEASE_TABLET | Freq: Every day | ORAL | 11 refills | Status: DC

## 2020-12-10 MED ORDER — TOPIRAMATE 25 MG PO TABS
25.0000 mg | ORAL_TABLET | Freq: Two times a day (BID) | ORAL | 1 refills | Status: DC
Start: 2020-12-10 — End: 2021-02-25

## 2020-12-10 NOTE — Telephone Encounter (Signed)
[employer] medicare order sent to [facility]. No auth they will reach out to the patient to schedule.  

## 2020-12-10 NOTE — Progress Notes (Signed)
[facility] [address] [place]. [place] [address] [phone]       OFFICE FOLLOW UP VISIT NOTE  [name] Date of Birth:  1956/06/04 Medical Record Number:  [id number]   Referring MD:  Apolonio Schneiders Reason for Referral:   Stroke  HPI: Initial visit 12/11/2019 [name] is a 65 year old lady seen today for initial office consultation visit upon referral from Dr. Apolonio Schneiders.  History is obtained from the patient and review of referral notes as well as electronic medical records. She states that she is had an episode in August 2020 when she was disoriented and confused for a while.  She got out of her car with her husband besides her and started walking.  She did not return and her husband could not raise her.  She was gone for several hours and eventually walked 6 miles to her home.  She states she is not aware of her surroundings and has no memory as to how she got there and why she was walking.  She denies any headache after this did not feel tired.  She had no injuries tongue bite.  She did not seek medical help and was not evaluated for this.  She also states that she had other what she calls a little spells in which she gets a warning when I start getting blurry and she feels that she is confused but is able to respond and is aware of her surroundings.  She started seeing squiggly lines and dark spots.  These episodes occur once every 2 weeks or so and last for only a few minutes.  She is unable to identify specific triggers for these episodes.  She takes a couple of aspirin and this episode stops.  She denies any known prior history of migraine headaches or similar visual episodes.  She has had some recent work-up and underwent carotid ultrasound on 12/10/2019 in Dr. Naida Sleight office and showed no significant carotid stenosis on either side.  LDL cholesterol on 12/03/2019 was 85 mg percent.  She had previous stroke in April 2015 when actually I saw her in the hospital she  presented with a left parietal MCA branch infarct with an MRI showing area of focal signal loss unclear as to embolus versus intrinsic narrowing.  Urine drug screen at that time was positive for cocaine.  Patient states she is quit drug since then.  She however admits to smoking weed but she states that she takes this because of chronic back pain.  She does not smoke cigarettes or drink alcohol.  She has been on aspirin as well as Persantine for stroke prevention.  She takes Vytorin for her will hyperlipidemia and is on chronic pain medications for back pain which include Flexeril, Zanaflex, Norco and gabapentin. Update 12/10/2020: She returns for follow-up after last visit a year ago.  Patient did not do the scheduled MRI and MRA as she got caught up with work-up for her cardiac issues and diagnosed with hypertrophic obstructive cardiomyopathy and underwent cardiac MRI and other studies.  She is also had problems with cyclical vomiting and was seen in the ER twice in October last year in January this year.  She did however undergo EEG on 12/24/2019 which was normal.  She had an echocardiogram done on 12/13/2019 which showed hypertrophic obstructive cardiomyopathy with a ejection fraction of 65 to 70% with severe left ventricular outflow tract obstruction and asymmetric septal hypertrophy.  LDL cholesterol on 01/08/2020 was 81 mg percent and  hemoglobin A1c was 5.1.  Patient states that she continues to have episodes of seeing sickly lines and some confusion and speech and word finding difficulties but these are not very frequent.  These may occur at variable frequency at times it may be once a day or every other day and then she can go days or weeks without it.  There are no obvious triggers.  She has been taking Topamax but takes it only at night.  She has not tried increasing the dose.  She denies any significant headaches.  She is still on aspirin 325 mg daily with does have some consideration for ulceration and  vomiting.  No other new neurological complaints ROS:   14 system review of systems is positive for confusion, disorientation, memory loss, blurred vision, visual spots, headache, anxiety, and all other systems negative PMH:  Past Medical History:  Diagnosis Date  . Abdominal pain    chronic, recurrent  . Allergy   . Bipolar affective disorder ([place])   . Chronic headaches   . Chronic pain   . Colon polyps    adenomatous  . Cyclical vomiting syndrome   . Depression   . Diverticulosis   . Endometriosis   . Fibrocystic breast   . GERD (gastroesophageal reflux disease)   . Hiatal hernia   . IBS (irritable bowel syndrome)   . Marijuana dependence ([place])   . Status post repair of paraesophageal diaphragmatic hernia   . Stroke [facility]) 12/2013    Social History:  Social History   Socioeconomic History  . Marital status: Married    Spouse name: Not on file  . Number of children: 3  . Years of education: Not on file  . Highest education level: Not on file  Occupational History  . Occupation: disabled  Tobacco Use  . Smoking status: Never Smoker  . Smokeless tobacco: Never Used  Vaping Use  . Vaping Use: Never used  Substance and Sexual Activity  . Alcohol use: No    Alcohol/week: 0.0 standard drinks  . Drug use: Yes    Types: Marijuana    Comment: smokes daily   . Sexual activity: Not on file  Other Topics Concern  . Not on file  Social History Narrative   Lives with husband   Right handed   Drinks caffeinated tea some   Social Determinants of Health   Financial Resource Strain: Not on file  Food Insecurity: Not on file  Transportation Needs: Not on file  Physical Activity: Not on file  Stress: Not on file  Social Connections: Not on file  Intimate Partner Violence: Not on file    Medications:   Current Outpatient Medications on File Prior to Visit  Medication Sig Dispense Refill  . diclofenac sodium (VOLTAREN) 1 % GEL Apply 1 g topically 3 (three) times daily  as needed (back and elbow pain).     Marland Kitchen dicyclomine (BENTYL) 20 MG tablet Take 1 tablet (20 mg total) by mouth 2 (two) times daily. 20 tablet 0  . ezetimibe-simvastatin (VYTORIN) 10-20 MG tablet Take 1 tablet by mouth at bedtime. 30 tablet 11  . furosemide (LASIX) 40 MG tablet Take 40 mg by mouth as needed for fluid.     Marland Kitchen gabapentin (NEURONTIN) 300 MG capsule Take 900 mg by mouth 3 (three) times daily as needed (nerve pain).    Marland Kitchen HYDROcodone-acetaminophen (NORCO/VICODIN) 5-325 MG tablet Take 1 tablet by mouth 4 (four) times daily as needed for moderate pain.     Marland Kitchen  lamoTRIgine (LAMICTAL) 150 MG tablet Take 150 mg by mouth at bedtime.    . metoprolol tartrate (LOPRESSOR) 25 MG tablet Take 0.5 tablets (12.5 mg total) by mouth 2 (two) times daily. 90 tablet 2  . ondansetron (ZOFRAN ODT) 4 MG disintegrating tablet Take 1 tablet (4 mg total) by mouth every 8 (eight) hours as needed for nausea. 30 tablet 0  . pantoprazole (PROTONIX) 40 MG tablet Take 1 tablet (40 mg total) by mouth daily. 30 tablet 11  . potassium chloride SA (K-DUR,KLOR-CON) 20 MEQ tablet Take 2 tablets (40 mEq total) by mouth 2 (two) times daily for 10 days. 40 tablet 0  . pramipexole (MIRAPEX) 1.5 MG tablet Take 1.5 mg by mouth daily.    . promethazine (PHENERGAN) 25 MG suppository INSERT 1 SUPPOSITORY INTO RECTUM EVERY 8 HOURS AS NEEDED FOR NAUSEA OR VOMITING 30 suppository 0  . promethazine (PHENERGAN) 25 MG tablet Take 1 tablet (25 mg total) by mouth every 6 (six) hours as needed for nausea or vomiting. 30 tablet 0  . sucralfate (CARAFATE) 1 g tablet Take 1 g by mouth 4 (four) times daily -  with meals and at bedtime.    Marland Kitchen tiZANidine (ZANAFLEX) 4 MG tablet Take 4 mg by mouth 3 (three) times daily.    Marland Kitchen venlafaxine XR (EFFEXOR-XR) 75 MG 24 hr capsule Take 75 mg by mouth at bedtime.     . cyclobenzaprine (FLEXERIL) 10 MG tablet Take 10 mg by mouth 3 (three) times daily as needed for muscle spasms.    Marland Kitchen perphenazine (TRILAFON) 4 MG  tablet Take 4 mg by mouth 3 (three) times daily.      No current facility-administered medications on file prior to visit.    Allergies:   Allergies  Allergen Reactions  . Dust Mite Extract   . Lyrica [Pregabalin]     Cause bladder issues     Physical Exam General: well developed, well nourished middle-aged lady, seated, in no evident distress Head: head normocephalic and atraumatic.   Neck: supple with no carotid or supraclavicular bruits Cardiovascular: regular rate and rhythm, no murmurs Musculoskeletal: no deformity.  Mild tightness of posterior cervical muscles Skin:  no rash/petichiae Vascular:  Normal pulses all extremities  Neurologic Exam Mental Status: Awake and fully alert. Oriented to place and time. Recent and remote memory intact. Attention span, concentration and fund of knowledge appropriate. Mood and affect appropriate.  Cranial Nerves: Fundoscopic exam not done. Pupils equal, briskly reactive to light. Extraocular movements full without nystagmus. Visual fields full to confrontation. Hearing intact. Facial sensation intact. Face, tongue, palate moves normally and symmetrically.  Motor: Normal bulk and tone. Normal strength in all tested extremity muscles. Sensory.: intact to touch , pinprick , position and vibratory sensation.  Coordination: Rapid alternating movements normal in all extremities. Finger-to-nose and heel-to-shin performed accurately bilaterally. Gait and Station: Arises from chair without difficulty. Stance is normal. Gait demonstrates normal stride length and balance . Able to heel, toe and tandem walk with mild difficulty.  Reflexes: 1+ and symmetric. Toes downgoing.       ASSESSMENT: 65 year old lady with episode of confusion/disorientation and memory loss in August 2020 of unclear etiology possibilities include complicated migraine versus complex partial seizure or vertebrobasilar TIA.  She also has recurrent transient episodes of visual  disturbances and blurred vision with slight confusion possibly complicated migraine episodes.  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  Vascular risk factors  of hyperlipidemia, hypertension, smoking marijuana and remote history of drug abuse     PLAN: I had a long discussion with the patient with regards to her episode of confusion disorientation and memory loss being of unclear etiology and possibilities include vertebrobasilar TIA versus complex partial seizure and complicated migraine would be less likely.  She is also having recurrent episodes of transient visual disturbance and seeing bright lights which may represent visual migraines.  I recommend  twice daily for prevention of migraines and check   MRI scan of the brain with MRA of the brain to follow her previous known left MCA stenosis.  I recommend she reduce coated 81 mg daily for stroke prevention and maintain aggressive risk factor modification with strict control of hypertension with blood pressure goal below 130/90, lipids with LDL cholesterol goal below 70 mg percent.  She was counseled to quit marijuana.  She will return for follow-up in the future in 3 months or call earlier if necessary. Greater than 50% time during this 25-minute consultation visit was spent on counseling and coordination of care about her episode of confusion/disorientation as well as vision disturbances and discussion about evaluation, prevention and treatment and answering questions.  Antony Contras, MD Note: This document was prepared with digital dictation and possible smart phrase technology. Any transcriptional errors that result from this process are unintentional.

## 2020-12-10 NOTE — Patient Instructions (Signed)
had a long discussion with the patient with regards to her episode of confusion disorientation and memory loss being of unclear etiology and possibilities include vertebrobasilar TIA versus complex partial seizure and complicated migraine would be less likely.  She is also having recurrent episodes of transient visual disturbance and seeing bright lights which may represent visual migraines.  I recommend  twice daily for prevention of migraines and check   MRI scan of the brain with MRA of the brain to follow her previous known left MCA stenosis.  I recommend she reduce coated 81 mg daily for stroke prevention and maintain aggressive risk factor modification with strict control of hypertension with blood pressure goal below 130/90, lipids with LDL cholesterol goal below 70 mg percent.  She was counseled to quit marijuana.  She will return for follow-up in the future in 3 months or call earlier if necessary.

## 2020-12-10 NOTE — Telephone Encounter (Signed)
pt was asked if she has any new issues. She states she has a lot of issues, not sure if migraines or from strokes, pt also mentioned vision issues.  Pt informed the appointment would be scheduled.  Pt also told RN will call if there are questions or concerns re: information provided.

## 2020-12-16 ENCOUNTER — Ambulatory Visit
Admission: RE | Admit: 2020-12-16 | Discharge: 2020-12-16 | Disposition: A | Discharge status: Home / Self Care | Payer: Medicare Other | Source: Ambulatory Visit | Attending: Neurology | Admitting: Neurology

## 2020-12-16 ENCOUNTER — Other Ambulatory Visit: Payer: Self-pay

## 2020-12-16 DIAGNOSIS — R202 Paresthesia of skin: Secondary | ICD-10-CM | POA: Diagnosis not present

## 2020-12-16 DIAGNOSIS — H539 Unspecified visual disturbance: Secondary | ICD-10-CM

## 2020-12-16 MED ORDER — GADOBENATE DIMEGLUMINE 529 MG/ML IV SOLN
10.0000 mL | Freq: Once | INTRAVENOUS | Status: AC | PRN
  Administered 2020-12-16: 10 mL via INTRAVENOUS

## 2020-12-18 NOTE — Progress Notes (Signed)
Kindly inform the patient that MR angiogram study of the brain did not show any major blockages of the large blood vessels in the brain.

## 2020-12-18 NOTE — Progress Notes (Signed)
Kindly inform the patient her MRI scan of the brain shows evidence of old stroke on the left towards the back but no new or worrisome findings.

## 2020-12-21 ENCOUNTER — Telehealth: Payer: Self-pay | Admitting: Emergency Medicine

## 2020-12-21 NOTE — Telephone Encounter (Signed)
Called patient and discussed Dr. Clydene Fake finidngs regarding MRI/MR Angiogram.  Patient is asking what he believes would be causing her vision changes since the MRI was negative for answers.    Patient denied further questions, verbalized understanding and expressed appreciation for the phone call.

## 2020-12-21 NOTE — Telephone Encounter (Signed)
-----   Message from Garvin Fila, MD sent at 12/18/2020  4:24 PM EST ----- Kindly inform the patient her MRI scan of the brain shows evidence of old stroke on the left towards the back but no new or worrisome findings.

## 2020-12-22 NOTE — Telephone Encounter (Signed)
I think the vision episodes may be a typical migraine as no structural or vascular etiology is found.  If the episodes are infrequent we can just wait and watch but if they are occurring very frequently may try medications for prevention.

## 2020-12-22 NOTE — Telephone Encounter (Signed)
Called patient and discussed Dr. Clydene Fake response.  Patient stated that she agrees and believes they have been migraines as she is coming back from vacation and hasn't had any, they are definitely better. She will call if she has any further questions.  Patient denied further questions, verbalized understanding and expressed appreciation for the phone call.

## 2020-12-24 ENCOUNTER — Other Ambulatory Visit: Payer: Self-pay | Admitting: Cardiovascular Disease

## 2021-01-12 ENCOUNTER — Ambulatory Visit: Payer: Medicare Other | Admitting: Cardiovascular Disease

## 2021-01-12 ENCOUNTER — Other Ambulatory Visit: Payer: Self-pay

## 2021-01-12 ENCOUNTER — Encounter: Payer: Self-pay | Admitting: Cardiovascular Disease

## 2021-01-12 DIAGNOSIS — E782 Mixed hyperlipidemia: Secondary | ICD-10-CM | POA: Diagnosis not present

## 2021-01-12 DIAGNOSIS — R0789 Other chest pain: Secondary | ICD-10-CM | POA: Diagnosis not present

## 2021-01-12 DIAGNOSIS — I517 Cardiomegaly: Secondary | ICD-10-CM | POA: Diagnosis not present

## 2021-01-12 NOTE — Patient Instructions (Signed)

## 2021-01-12 NOTE — Progress Notes (Signed)
01/12/2021 [name]   July 07, 1956  [id number]  Primary Physician [name]., FNP Primary Cardiologist: Anne Harp MD [name], [place], [place]  HPI:  [name] is a 65 y.o.  thin, frail and chronically ill appearing married Caucasian female mother of 23 ,  grandmother 7 grandchildren referred by Dr. Cottie Griffith for cardiovascular relation because of atypical chest pain.  I last saw her in the office 01/21/2020.  She is accompanied by her husband by telephone today.She is currently disabled because of neck and back pain, prior stroke and psychiatric issues. Risk factors include treated hyperlipidemia and family history. Her father had a myocardial infarction age 79. She does not smoke tobacco but does smoke marijuana. She is had a stroke in the past but does not have a heart attack. Since August she is noted exertional chest pressure.  She was hospitalized for 1 day on 01/06/2020 for chest pain and ruled out for myocardial infarction.  A coronary CT revealed a coronary calcium score of 0 with no evidence of CAD.  Cardiac MRI did show basal septal hypertrophy.  She had anterior motion of the mitral valve and peak gradient of 60 mmHg with Valsalva suggesting dynamic outflow obstruction.  She is placed on low-dose beta-blocker by Dr. Meda Griffith.  Since I saw her a year ago she is lost approximately 10 pounds.  Her 64 year old son apparently moved back in the house.  She has had more difficult recently with psychiatric issues.  She still complains of atypical chest pain.   Current Meds  Medication Sig  . aspirin EC 81 MG tablet Take 1 tablet (81 mg total) by mouth daily. Swallow whole.  . budesonide-formoterol (SYMBICORT) 160-4.5 MCG/ACT inhaler Inhale 2 puffs into the lungs 2 (two) times daily.  . cyclobenzaprine (FLEXERIL) 10 MG tablet Take 10 mg by mouth 3 (three) times daily as needed for muscle spasms.  . diclofenac sodium (VOLTAREN) 1 % GEL Apply 1 g topically 3  (three) times daily as needed (back and elbow pain).   Marland Kitchen dicyclomine (BENTYL) 20 MG tablet Take 1 tablet (20 mg total) by mouth 2 (two) times daily.  Marland Kitchen ezetimibe-simvastatin (VYTORIN) 10-20 MG tablet Take 1 tablet by mouth at bedtime.  . furosemide (LASIX) 40 MG tablet Take 40 mg by mouth as needed for fluid.   Marland Kitchen gabapentin (NEURONTIN) 300 MG capsule Take 900 mg by mouth 3 (three) times daily as needed (nerve pain).  Marland Kitchen HYDROcodone-acetaminophen (NORCO/VICODIN) 5-325 MG tablet Take 1 tablet by mouth 4 (four) times daily as needed for moderate pain.   Marland Kitchen lamoTRIgine (LAMICTAL) 150 MG tablet Take 150 mg by mouth at bedtime.  . metoprolol tartrate (LOPRESSOR) 25 MG tablet TAKE ONE-HALF TABLET BY  MOUTH TWICE DAILY  . ondansetron (ZOFRAN ODT) 4 MG disintegrating tablet Take 1 tablet (4 mg total) by mouth every 8 (eight) hours as needed for nausea.  . pantoprazole (PROTONIX) 40 MG tablet Take 1 tablet (40 mg total) by mouth daily.  Marland Kitchen perphenazine (TRILAFON) 4 MG tablet Take 4 mg by mouth 3 (three) times daily.   . potassium chloride SA (K-DUR,KLOR-CON) 20 MEQ tablet Take 2 tablets (40 mEq total) by mouth 2 (two) times daily for 10 days.  . pramipexole (MIRAPEX) 1.5 MG tablet Take 1.5 mg by mouth daily.  . promethazine (PHENERGAN) 25 MG suppository INSERT 1 SUPPOSITORY INTO RECTUM EVERY 8 HOURS AS NEEDED FOR NAUSEA OR VOMITING  . promethazine (PHENERGAN) 25 MG tablet Take  1 tablet (25 mg total) by mouth every 6 (six) hours as needed for nausea or vomiting.  . sucralfate (CARAFATE) 1 g tablet Take 1 g by mouth 4 (four) times daily -  with meals and at bedtime.  Marland Kitchen tiZANidine (ZANAFLEX) 4 MG tablet Take 4 mg by mouth 3 (three) times daily.  Marland Kitchen topiramate (TOPAMAX) 25 MG tablet Take 1 tablet (25 mg total) by mouth 2 (two) times daily.  Marland Kitchen venlafaxine XR (EFFEXOR-XR) 75 MG 24 hr capsule Take 75 mg by mouth at bedtime.      Allergies  Allergen Reactions  . Dust Mite Extract   . Lyrica [Pregabalin]     Cause  bladder issues     Social History   Socioeconomic History  . Marital status: Married    Spouse name: Not on file  . Number of children: 3  . Years of education: Not on file  . Highest education level: Not on file  Occupational History  . Occupation: disabled  Tobacco Use  . Smoking status: Never Smoker  . Smokeless tobacco: Never Used  Vaping Use  . Vaping Use: Never used  Substance and Sexual Activity  . Alcohol use: No    Alcohol/week: 0.0 standard drinks  . Drug use: Yes    Types: Marijuana    Comment: smokes daily   . Sexual activity: Not on file  Other Topics Concern  . Not on file  Social History Narrative   Lives with husband   Right handed   Drinks caffeinated tea some   Social Determinants of Health   Financial Resource Strain: Not on file  Food Insecurity: Not on file  Transportation Needs: Not on file  Physical Activity: Not on file  Stress: Not on file  Social Connections: Not on file  Intimate Partner Violence: Not on file     Review of Systems: General: negative for chills, fever, night sweats or weight changes.  Cardiovascular: negative for chest pain, dyspnea on exertion, edema, orthopnea, palpitations, paroxysmal nocturnal dyspnea or shortness of breath Dermatological: negative for rash Respiratory: negative for cough or wheezing Urologic: negative for hematuria Abdominal: negative for nausea, vomiting, diarrhea, bright red blood per rectum, melena, or hematemesis Neurologic: negative for visual changes, syncope, or dizziness All other systems reviewed and are otherwise negative except as noted above.    Blood pressure 98/60, pulse (!) 48, height 5\' 6"  (1.676 m), weight 125 lb (56.7 kg).    EKG sinus bradycardia at 48 with LVH voltage and nonspecific ST and T wave changes.  I personally reviewed this EKG.  ASSESSMENT AND PLAN:   Hyperlipidemia History of hyperlipidemia on Zetia and simvastatin with lipid profile performed 01/08/2020  revealing total cholesterol 129, LDL 67 and HDL 42.  Atypical chest pain History of atypical chest pain with coronary CTA performed 1 year ago revealing coronary calcium score of 0 with out evidence of CAD.  LVH (left ventricular hypertrophy) History of basal septal hypertrophy with normal LV function by echo 12/13/2019.  She did have a cardiac MRI confirmed asymmetric septal hypertrophy 01/07/2020 with late gadolinium enhancement.  She is on a low-dose beta-blocker.      Anne Harp MD FACP,FACC,FAHA, [facility] 01/12/2021 10:56 AM

## 2021-01-12 NOTE — Assessment & Plan Note (Signed)
History of basal septal hypertrophy with normal LV function by echo 12/13/2019.  She did have a cardiac MRI confirmed asymmetric septal hypertrophy 01/07/2020 with late gadolinium enhancement.  She is on a low-dose beta-blocker.

## 2021-01-12 NOTE — Assessment & Plan Note (Signed)
History of atypical chest pain with coronary CTA performed 1 year ago revealing coronary calcium score of 0 with out evidence of CAD.

## 2021-01-12 NOTE — Assessment & Plan Note (Signed)
History of hyperlipidemia on Zetia and simvastatin with lipid profile performed 01/08/2020 revealing total cholesterol 129, LDL 67 and HDL 42.

## 2021-02-22 ENCOUNTER — Other Ambulatory Visit: Payer: Self-pay

## 2021-02-22 ENCOUNTER — Ambulatory Visit (AMBULATORY_SURGERY_CENTER): Payer: Self-pay | Admitting: *Deleted

## 2021-02-22 VITALS — Ht 66.0 in | Wt 115.0 lb

## 2021-02-22 DIAGNOSIS — Z86010 Personal history of colonic polyps: Secondary | ICD-10-CM

## 2021-02-22 MED ORDER — NA SULFATE-K SULFATE-MG SULF 17.5-3.13-1.6 GM/177ML PO SOLN: ORAL | 0 refills | Status: DC

## 2021-02-22 NOTE — Progress Notes (Signed)
Patient is here in-person for PV. Patient denies any allergies to eggs or soy. Patient denies any problems with anesthesia/sedation. Patient denies any oxygen use at home. Patient denies taking any diet/weight loss medications or blood thinners. Patient is not being treated for MRSA or C-diff. Patient is aware of our care-partner policy and Covid-19 safety protocol. EMMI education assigned to the patient for the procedure, sent to MyChart.   Patient is fully COVID-19 vaccinated, per patient.    

## 2021-02-25 ENCOUNTER — Other Ambulatory Visit: Payer: Self-pay | Admitting: Emergency Medicine

## 2021-02-25 MED ORDER — TOPIRAMATE 25 MG PO TABS: 25.0000 mg | ORAL_TABLET | Freq: Two times a day (BID) | ORAL | 1 refills | Status: AC

## 2021-03-05 ENCOUNTER — Encounter: Payer: Self-pay | Admitting: Internal Medicine

## 2021-03-08 ENCOUNTER — Encounter: Payer: Self-pay | Admitting: Internal Medicine

## 2021-03-08 ENCOUNTER — Other Ambulatory Visit: Payer: Self-pay | Admitting: Internal Medicine

## 2021-03-08 ENCOUNTER — Other Ambulatory Visit: Payer: Self-pay

## 2021-03-08 ENCOUNTER — Ambulatory Visit (AMBULATORY_SURGERY_CENTER): Payer: Medicare Other | Admitting: Internal Medicine

## 2021-03-08 VITALS — BP 106/48 | HR 64 | Temp 97.8°F | Resp 12 | Ht 66.0 in | Wt 115.0 lb

## 2021-03-08 DIAGNOSIS — K52832 Lymphocytic colitis: Secondary | ICD-10-CM | POA: Diagnosis not present

## 2021-03-08 DIAGNOSIS — R197 Diarrhea, unspecified: Secondary | ICD-10-CM

## 2021-03-08 DIAGNOSIS — Z86010 Personal history of colonic polyps: Secondary | ICD-10-CM

## 2021-03-08 MED ORDER — SODIUM CHLORIDE 0.9 % IV SOLN: 500.0000 mL | Freq: Once | INTRAVENOUS | Status: DC

## 2021-03-08 NOTE — Progress Notes (Signed)
PT taken to PACU. Monitors in place. VSS. Report given to RN. 

## 2021-03-08 NOTE — Progress Notes (Signed)
Called to room to assist during endoscopic procedure.  Patient ID and intended procedure confirmed with present staff. Received instructions for my participation in the procedure from the performing physician.  

## 2021-03-08 NOTE — Op Note (Signed)
[facility] Patient Name: [name] Procedure Date: 03/08/2021 1:23 PM MRN: [id number] Endoscopist: Docia Chuck. Henrene Pastor , MD Age: 65 Referring MD:  Date of Birth: [birth year] Gender: Female Account #: [id number] Procedure:                Colonoscopy with biopsies Indications:              High risk colon cancer surveillance: Personal                            history of non-advanced adenoma. Previous                            examination December 2016.; ALSO, THE PATIENT                            COMPLAINS OF DIARRHEA FOR 6 MONTHS with weight loss Medicines:                Monitored Anesthesia Care Procedure:                Pre-Anesthesia Assessment:                           - Prior to the procedure, a History and Physical                            was performed, and patient medications and                            allergies were reviewed. The patient's tolerance of                            previous anesthesia was also reviewed. The risks                            and benefits of the procedure and the sedation                            options and risks were discussed with the patient.                            All questions were answered, and informed consent                            was obtained. Prior Anticoagulants: The patient has                            taken no previous anticoagulant or antiplatelet                            agents. ASA Grade Assessment: III - A patient with                            severe systemic disease. After reviewing the risks  and benefits, the patient was deemed in                            satisfactory condition to undergo the procedure.                           After obtaining informed consent, the colonoscope                            was passed under direct vision. Throughout the                            procedure, the patient's blood pressure, pulse, and                            oxygen  saturations were monitored continuously. The                            Olympus CF-HQ190 [id number]) Colonoscope was                            introduced through the anus and advanced to the the                            cecum, identified by appendiceal orifice and                            ileocecal valve. The ileocecal valve, appendiceal                            orifice, and rectum were photographed. The quality                            of the bowel preparation was excellent. The                            colonoscopy was performed without difficulty. The                            patient tolerated the procedure well. The bowel                            preparation used was SUPREP via split dose                            instruction. Scope In: 1:32:34 PM Scope Out: 1:46:59 PM Scope Withdrawal Time: 0 hours 8 minutes 53 seconds  Total Procedure Duration: 0 hours 14 minutes 25 seconds  Findings:                 Multiple diverticula were found in the left colon                            and right colon.  The exam was otherwise without abnormality on                            direct and retroflexion views. Random colon                            biopsies taken to rule out microscopic colitis. Complications:            No immediate complications. Estimated blood loss:                            None. Estimated Blood Loss:     Estimated blood loss: none. Impression:               - Diverticulosis in the left colon and in the right                            colon.                           - The examination was otherwise normal on direct                            and retroflexion views.                           - Random colon biopsies obtained. Recommendation:           - Repeat colonoscopy in 10 years for surveillance.                           - Patient has a contact number available for                            emergencies. The signs and symptoms  of potential                            delayed complications were discussed with the                            patient. Return to normal activities tomorrow.                            Written discharge instructions were provided to the                            patient.                           - Resume previous diet.                           - Continue present medications.                           - Await pathology results.                           -  Imodium as needed for diarrhea                           - Office follow-up with Dr. Henrene Pastor in 4 to 6 weeks Docia Chuck. Henrene Pastor, MD 03/08/2021 1:59:14 PM This report has been signed electronically.

## 2021-03-08 NOTE — Patient Instructions (Signed)
Please read handouts provided. Continue present medications. Await pathology results. Imodium as needed for diarrhea. Office follow-up with Dr. Henrene Pastor in 4-6 weeks.    YOU HAD AN ENDOSCOPIC PROCEDURE TODAY AT [facility]:   Refer to the procedure report that was given to you for any specific questions about what was found during the examination.  If the procedure report does not answer your questions, please call your gastroenterologist to clarify.  If you requested that your care partner not be given the details of your procedure findings, then the procedure report has been included in a sealed envelope for you to review at your convenience later.  YOU SHOULD EXPECT: Some feelings of bloating in the abdomen. Passage of more gas than usual.  Walking can help get rid of the air that was put into your GI tract during the procedure and reduce the bloating. If you had a lower endoscopy (such as a colonoscopy or flexible sigmoidoscopy) you may notice spotting of blood in your stool or on the toilet paper. If you underwent a bowel prep for your procedure, you may not have a normal bowel movement for a few days.  Please Note:  You might notice some irritation and congestion in your nose or some drainage.  This is from the oxygen used during your procedure.  There is no need for concern and it should clear up in a day or so.  SYMPTOMS TO REPORT IMMEDIATELY:   Following lower endoscopy (colonoscopy or flexible sigmoidoscopy):  Excessive amounts of blood in the stool  Significant tenderness or worsening of abdominal pains  Swelling of the abdomen that is new, acute  Fever of 100F or higher   For urgent or emergent issues, a gastroenterologist can be reached at any hour by calling [phone]. Do not use MyChart messaging for urgent concerns.    DIET:  We do recommend a small meal at first, but then you may proceed to your regular diet.  Drink plenty of fluids but you should  avoid alcoholic beverages for 24 hours.  ACTIVITY:  You should plan to take it easy for the rest of today and you should NOT DRIVE or use heavy machinery until tomorrow (because of the sedation medicines used during the test).    FOLLOW UP: Our staff will call the number listed on your records 48-72 hours following your procedure to check on you and address any questions or concerns that you may have regarding the information given to you following your procedure. If we do not reach you, we will leave a message.  We will attempt to reach you two times.  During this call, we will ask if you have developed any symptoms of COVID 19. If you develop any symptoms (ie: fever, flu-like symptoms, shortness of breath, cough etc.) before then, please call [phone].  If you test positive for Covid 19 in the 2 weeks post procedure, please call and report this information to [place].    If any biopsies were taken you will be contacted by phone or by letter within the next 1-3 weeks.  Please call us at [phone] if you have not heard about the biopsies in 3 weeks.    SIGNATURES/CONFIDENTIALITY: You and/or your care partner have signed paperwork which will be entered into your electronic medical record.  These signatures attest to the fact that that the information above on your After Visit Summary has been reviewed and is understood.  Full responsibility of the confidentiality of this  discharge information lies with you and/or your care-partner. 

## 2021-03-08 NOTE — Progress Notes (Signed)
Pt's states no medical or surgical changes since previsit or office visit. 

## 2021-03-10 ENCOUNTER — Telehealth: Payer: Self-pay

## 2021-03-10 NOTE — Telephone Encounter (Signed)
  Follow up Call-  Call back number 03/08/2021 12/25/2019  Post procedure Call Back phone  # [phone]  Permission to leave phone message Yes Yes  Some recent data might be hidden     Patient questions:  Do you have a fever, pain , or abdominal swelling? No. Pain Score  0 *  Have you tolerated food without any problems? Yes.    Have you been able to return to your normal activities? Yes.    Do you have any questions about your discharge instructions: Diet   No. Medications  No. Follow up visit  No.  Do you have questions or concerns about your Care? No.  Actions: * If pain score is 4 or above: No action needed, pain <4.  1. Have you developed a fever since your procedure? no  2.   Have you had an respiratory symptoms (SOB or cough) since your procedure? no  3.   Have you tested positive for COVID 19 since your procedure no  4.   Have you had any family members/close contacts diagnosed with the COVID 19 since your procedure?  no   If yes to any of these questions please route to [name], RN and Joella Prince, RN

## 2021-03-11 ENCOUNTER — Encounter: Payer: Self-pay | Admitting: Adult Health

## 2021-03-11 ENCOUNTER — Ambulatory Visit: Payer: Medicare Other | Admitting: Adult Health

## 2021-03-11 NOTE — Progress Notes (Deleted)
[facility] [address] [place]. [place] [address] [phone]       OFFICE FOLLOW UP VISIT NOTE  [name] Date of Birth:  1955-11-18 Medical Record Number:  [id number]   Referring MD:  Alonna Buckler Reason for Referral:   Stroke  HPI:  Today, 03/11/2021, [name] returns for follow up after prior visit 3 months ago with Dr. Pearlean Brownie.      On topiramate 25 mg twice daily  MRI/MRA negative for acute abnormalities.   Compliant on aspirin and Vytorin for secondary stroke prevention without side effects routinely following with PCP and cardiology for aggressive stroke risk factor management.       History provided for reference purposes only Update 12/10/2020 Dr. Pearlean Brownie: She returns for follow-up after last visit a year ago.  Patient did not do the scheduled MRI and MRA as she got caught up with work-up for her cardiac issues and diagnosed with hypertrophic obstructive cardiomyopathy and underwent cardiac MRI and other studies.  She is also had problems with cyclical vomiting and was seen in the ER twice in October last year in January this year.  She did however undergo EEG on 12/24/2019 which was normal.  She had an echocardiogram done on 12/13/2019 which showed hypertrophic obstructive cardiomyopathy with a ejection fraction of 65 to 70% with severe left ventricular outflow tract obstruction and asymmetric septal hypertrophy.  LDL cholesterol on 01/08/2020 was 81 mg percent and hemoglobin A1c was 5.1.  Patient states that she continues to have episodes of seeing sickly lines and some confusion and speech and word finding difficulties but these are not very frequent.  These may occur at variable frequency at times it may be once a day or every other day and then she can go days or weeks without it.  There are no obvious triggers.  She has been taking Topamax but takes it only at night.  She has not tried increasing the dose.  She denies any significant headaches.   She is still on aspirin 325 mg daily with does have some consideration for ulceration and vomiting.  No other new neurological complaints   Initial visit 12/11/2019 Dr. Pearlean Brownie: [name] is a 65 year old lady seen today for initial office consultation visit upon referral from Dr. Alonna Buckler.  History is obtained from the patient and review of referral notes as well as electronic medical records. She states that she is had an episode in August 2020 when she was disoriented and confused for a while.  She got out of her car with her husband besides her and started walking.  She did not return and her husband could not raise her.  She was gone for several hours and eventually walked 6 miles to her home.  She states she is not aware of her surroundings and has no memory as to how she got there and why she was walking.  She denies any headache after this did not feel tired.  She had no injuries tongue bite.  She did not seek medical help and was not evaluated for this.  She also states that she had other what she calls a little spells in which she gets a warning when I start getting blurry and she feels that she is confused but is able to respond and is aware of her surroundings.  She started seeing squiggly lines and dark spots.  These episodes occur once every 2 weeks or so and last for only a few minutes.  She is unable to identify specific triggers for these episodes.  She takes a couple of aspirin and this episode stops.  She denies any known prior history of migraine headaches or similar visual episodes.  She has had some recent work-up and underwent carotid ultrasound on 12/10/2019 in Dr. Naida Sleight office and showed no significant carotid stenosis on either side.  LDL cholesterol on 12/03/2019 was 85 mg percent.  She had previous stroke in April 2015 when actually I saw her in the hospital she presented with a left parietal MCA branch infarct with an MRI showing area of focal signal loss unclear as to embolus versus  intrinsic narrowing.  Urine drug screen at that time was positive for cocaine.  Patient states she is quit drug since then.  She however admits to smoking weed but she states that she takes this because of chronic back pain.  She does not smoke cigarettes or drink alcohol.  She has been on aspirin as well as Persantine for stroke prevention.  She takes Vytorin for her will hyperlipidemia and is on chronic pain medications for back pain which include Flexeril, Zanaflex, Norco and gabapentin.  ROS:   14 system review of systems is positive for confusion, disorientation, memory loss, blurred vision, visual spots, headache, anxiety, and all other systems negative PMH:  Past Medical History:  Diagnosis Date  . Abdominal pain    chronic, recurrent  . Allergy   . Bipolar affective disorder ([place])   . Chronic headaches   . Chronic pain   . Colon polyps    adenomatous  . Cyclical vomiting syndrome   . Depression   . Diverticulosis   . Endometriosis   . Fibrocystic breast   . GERD (gastroesophageal reflux disease)   . Hiatal hernia   . IBS (irritable bowel syndrome)   . Marijuana dependence ([place])   . Status post repair of paraesophageal diaphragmatic hernia   . Stroke [facility]) 12/2013    Social History:  Social History   Socioeconomic History  . Marital status: Married    Spouse name: Not on file  . Number of children: 3  . Years of education: Not on file  . Highest education level: Not on file  Occupational History  . Occupation: disabled  Tobacco Use  . Smoking status: Never Smoker  . Smokeless tobacco: Never Used  Vaping Use  . Vaping Use: Never used  Substance and Sexual Activity  . Alcohol use: No    Alcohol/week: 0.0 standard drinks  . Drug use: Yes    Types: Marijuana    Comment: smokes daily   . Sexual activity: Not on file  Other Topics Concern  . Not on file  Social History Narrative   Lives with husband   Right handed   Drinks caffeinated tea some   Social  Determinants of Health   Financial Resource Strain: Not on file  Food Insecurity: Not on file  Transportation Needs: Not on file  Physical Activity: Not on file  Stress: Not on file  Social Connections: Not on file  Intimate Partner Violence: Not on file    Medications:   Current Outpatient Medications on File Prior to Visit  Medication Sig Dispense Refill  . aspirin EC 81 MG tablet Take 1 tablet (81 mg total) by mouth daily. Swallow whole. 30 tablet 11  . budesonide-formoterol (SYMBICORT) 160-4.5 MCG/ACT inhaler Inhale 2 puffs into the lungs 2 (two) times daily.    . diclofenac sodium (VOLTAREN) 1 % GEL Apply 1  g topically 3 (three) times daily as needed (back and elbow pain).     Marland Kitchen dicyclomine (BENTYL) 20 MG tablet Take 1 tablet (20 mg total) by mouth 2 (two) times daily. 20 tablet 0  . ezetimibe-simvastatin (VYTORIN) 10-20 MG tablet Take 1 tablet by mouth at bedtime. 30 tablet 11  . furosemide (LASIX) 40 MG tablet Take 40 mg by mouth as needed for fluid.     Marland Kitchen gabapentin (NEURONTIN) 300 MG capsule Take 900 mg by mouth 3 (three) times daily as needed (nerve pain).    Marland Kitchen HYDROcodone-acetaminophen (NORCO/VICODIN) 5-325 MG tablet Take 1 tablet by mouth 4 (four) times daily as needed for moderate pain.  (Patient not taking: Reported on 03/08/2021)    . lamoTRIgine (LAMICTAL) 150 MG tablet Take 150 mg by mouth at bedtime.    . ondansetron (ZOFRAN ODT) 4 MG disintegrating tablet Take 1 tablet (4 mg total) by mouth every 8 (eight) hours as needed for nausea. 30 tablet 0  . pantoprazole (PROTONIX) 40 MG tablet Take 1 tablet (40 mg total) by mouth daily. 30 tablet 11  . potassium chloride SA (K-DUR,KLOR-CON) 20 MEQ tablet Take 2 tablets (40 mEq total) by mouth 2 (two) times daily for 10 days. 40 tablet 0  . pramipexole (MIRAPEX) 1.5 MG tablet Take 1.5 mg by mouth daily.    . promethazine (PHENERGAN) 25 MG suppository INSERT 1 SUPPOSITORY INTO RECTUM EVERY 8 HOURS AS NEEDED FOR NAUSEA OR VOMITING 30  suppository 0  . promethazine (PHENERGAN) 25 MG tablet Take 1 tablet (25 mg total) by mouth every 6 (six) hours as needed for nausea or vomiting. 30 tablet 0  . sucralfate (CARAFATE) 1 g tablet Take 1 g by mouth 4 (four) times daily -  with meals and at bedtime.    Marland Kitchen tiZANidine (ZANAFLEX) 4 MG tablet Take 4 mg by mouth 3 (three) times daily.    Marland Kitchen topiramate (TOPAMAX) 25 MG tablet Take 1 tablet (25 mg total) by mouth 2 (two) times daily. 90 tablet 1  . venlafaxine XR (EFFEXOR-XR) 75 MG 24 hr capsule Take 75 mg by mouth at bedtime.      No current facility-administered medications on file prior to visit.    Allergies:   Allergies  Allergen Reactions  . Dust Mite Extract   . Lyrica [Pregabalin]     Cause bladder issues     Physical Exam General: well developed, well nourished middle-aged lady, seated, in no evident distress Head: head normocephalic and atraumatic.   Neck: supple with no carotid or supraclavicular bruits Cardiovascular: regular rate and rhythm, no murmurs Musculoskeletal: no deformity.  Mild tightness of posterior cervical muscles Skin:  no rash/petichiae Vascular:  Normal pulses all extremities  Neurologic Exam Mental Status: Awake and fully alert. Oriented to place and time. Recent and remote memory intact. Attention span, concentration and fund of knowledge appropriate. Mood and affect appropriate.  Cranial Nerves: Fundoscopic exam not done. Pupils equal, briskly reactive to light. Extraocular movements full without nystagmus. Visual fields full to confrontation. Hearing intact. Facial sensation intact. Face, tongue, palate moves normally and symmetrically.  Motor: Normal bulk and tone. Normal strength in all tested extremity muscles. Sensory.: intact to touch , pinprick , position and vibratory sensation.  Coordination: Rapid alternating movements normal in all extremities. Finger-to-nose and heel-to-shin performed accurately bilaterally. Gait and Station: Arises  from chair without difficulty. Stance is normal. Gait demonstrates normal stride length and balance . Able to heel, toe and tandem walk with mild  difficulty.  Reflexes: 1+ and symmetric. Toes downgoing.       ASSESSMENT: 65 year old lady with episode of confusion/disorientation and memory loss in August 2020 of unclear etiology possibilities include complicated migraine versus complex partial seizure or vertebrobasilar TIA.  She also has recurrent transient episodes of visual disturbances and blurred vision with slight confusion possibly complicated migraine episodes.  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  Vascular risk factors of hyperlipidemia, hypertension, smoking marijuana and remote history of drug abuse     PLAN  Transient visual disturbances -Possibly visual migraines -Improved with use of topiramate 25 mg twice daily -MRI/MRA negative for acute abnormality   History of L MCA stroke -Continue aspirin 81 mg daily and Vytorin for secondary stroke prevention measures -Discussed secondary stroke prevention measures and importance of close PCP follow-up for aggressive stroke risk factor management including hypertension with blood pressure goal below 130/90 and lipids with LDL cholesterol goal below 70 mg percent.        CC:  [facility] provider: Sonia Side., FNP   I spent *** minutes of face-to-face and non-face-to-face time with patient.  This included previsit chart review, lab review, study review, order entry, electronic health record documentation, patient education  Frann Rider, [facility]  [facility] [address] [place], [place] [address]  Phone [phone] Fax [phone] Note: This document was prepared with digital dictation and possible smart phrase technology. Any transcriptional errors that result from this process are unintentional.

## 2021-03-30 ENCOUNTER — Encounter: Payer: Self-pay | Admitting: Internal Medicine

## 2021-03-30 ENCOUNTER — Other Ambulatory Visit: Payer: Self-pay | Admitting: Neurology

## 2021-04-09 ENCOUNTER — Other Ambulatory Visit: Payer: Self-pay

## 2021-04-09 MED ORDER — BUDESONIDE 3 MG PO CPEP: 9.0000 mg | ORAL_CAPSULE | Freq: Every day | ORAL | 3 refills | Status: DC

## 2021-04-28 ENCOUNTER — Ambulatory Visit: Payer: Medicare Other | Admitting: Internal Medicine

## 2021-05-27 ENCOUNTER — Ambulatory Visit: Payer: Medicare Other | Admitting: Internal Medicine

## 2021-05-27 ENCOUNTER — Encounter: Payer: Self-pay | Admitting: Internal Medicine

## 2021-05-27 VITALS — BP 102/60 | HR 72 | Ht 66.0 in | Wt 109.0 lb

## 2021-05-27 DIAGNOSIS — R197 Diarrhea, unspecified: Secondary | ICD-10-CM | POA: Diagnosis not present

## 2021-05-27 DIAGNOSIS — K52832 Lymphocytic colitis: Secondary | ICD-10-CM

## 2021-05-27 DIAGNOSIS — R110 Nausea: Secondary | ICD-10-CM

## 2021-05-27 DIAGNOSIS — R1115 Cyclical vomiting syndrome unrelated to migraine: Secondary | ICD-10-CM

## 2021-05-27 DIAGNOSIS — K219 Gastro-esophageal reflux disease without esophagitis: Secondary | ICD-10-CM

## 2021-05-27 MED ORDER — PREDNISONE 10 MG PO TABS: ORAL_TABLET | ORAL | 0 refills | Status: DC

## 2021-05-27 MED ORDER — PROMETHAZINE HCL 25 MG PO TABS: 25.0000 mg | ORAL_TABLET | Freq: Four times a day (QID) | ORAL | 0 refills | Status: AC | PRN

## 2021-05-27 NOTE — Patient Instructions (Addendum)
If you are age 64 or older, your body mass index should be between 23-30. Your Body mass index is 17.59 kg/m. If this is out of the aforementioned range listed, please consider follow up with your Primary Care Provider. __________________________________________________________  The [facility] providers would like to encourage you to use [facility] to communicate with providers for non-urgent requests or questions.  Due to long hold times on the telephone, sending your provider a message by [facility] may be a faster and more efficient way to get a response.  Please allow 48 business hours for a response.  Please remember that this is for non-urgent requests.   We have sent the following medications to your pharmacy for you to pick up at your convenience:  Phenergan: please get future refills from your primary care physician.  START: prednisone '10mg'$  take 4 tablets for 7 days, then 3 tablets for 7 days, then 2 tablets for 7 days, then one tablet until you follow up with our office.  You are scheduled to follow up with our office on 07-07-2021 at 9:40am.  Thank you for entrusting me with your care and choosing [facility].  Dr Henrene Pastor

## 2021-06-01 ENCOUNTER — Encounter: Payer: Self-pay | Admitting: Internal Medicine

## 2021-06-01 NOTE — Progress Notes (Signed)
HISTORY OF PRESENT ILLNESS:  [name] is a 65 y.o. female with multiple medical problems including IBS, GERD, bipolar disorder, chronic headaches, prior stroke, cyclic vomiting, marijuana dependence, and a history of paraesophageal hernia with repair (mesh and gastropexy November 2015, elsewhere).  Last seen in this office February 04, 2020.  See that dictation.  Seen most recently in the endoscopy center for surveillance colonoscopy.  The procedure was performed Mar 08, 2021.  At that time the patient reported to me that she had been having 6 months of diarrhea and associated weight loss.  Complete colonoscopy revealed pandiverticulosis.  Examination was otherwise normal.  However, random colon biopsies demonstrated lymphocytic colitis.  This was felt to be the cause for her chronic persistent diarrhea.  She was prescribed budesonide 9 mg daily.  She did not pick up or start the medication due to cost (anywhere between $300 and $600 per month, per patient).  This office appointment made.  Patient tells me that her problems with diarrhea have continued.  She continues with chronic nausea and request a refill of her friend again.  Generally prescribed by her PCP.  She tells me that she has taken prednisone in the past without any issues.  Review of blood work from January 2022 shows normal CBC with hemoglobin 14.2  REVIEW OF SYSTEMS:  All non-GI ROS negative unless otherwise stated in the HPI except for signs and allergies, anxiety, depression, back pain, fatigue, headaches, night sweats, hearing problems, ankle swelling, sleeping problems, shortness of breath due to COPD  Past Medical History:  Diagnosis Date   Abdominal pain    chronic, recurrent   Allergy    Bipolar affective disorder ([facility])    Chronic headaches    Chronic pain    Colon polyps    adenomatous   Cyclical vomiting syndrome    Depression    Diverticulosis    Endometriosis    Fibrocystic breast    GERD (gastroesophageal reflux  disease)    Hiatal hernia    IBS (irritable bowel syndrome)    Marijuana dependence ([place])    Status post repair of paraesophageal diaphragmatic hernia    Stroke ([place]) 12/2013    Past Surgical History:  Procedure Laterality Date   ABDOMINAL HYSTERECTOMY  1990   BREAST LUMPECTOMY Bilateral    COLONOSCOPY  10/07/2015   Dr.Lynx Goodrich   FOOT FRACTURE SURGERY Right    x 3   HIATAL HERNIA REPAIR     NISSEN FUNDOPLICATION  123456   hiatal hernia repair   TOE SURGERY Left     Social History [name]  reports that she has never smoked. She has never used smokeless tobacco. She reports current drug use. Drug: Marijuana. She reports that she does not drink alcohol.  family history includes Brain cancer in her mother; Breast cancer in her paternal grandmother and sister; Diabetes in her mother and sister.  Allergies  Allergen Reactions   Dust Mite Extract    Lyrica [Pregabalin]     Cause bladder issues        PHYSICAL EXAMINATION: Vital signs: BP 102/60   Pulse 72   Ht '5\' 6"'$  (1.676 m)   Wt 109 lb (49.4 kg)   SpO2 98%   BMI 17.59 kg/m   Constitutional: generally well-appearing, no acute distress Psychiatric: alert and oriented x3, cooperative Eyes: extraocular movements intact, anicteric, conjunctiva pink Mouth: oral pharynx moist, no lesions Neck: supple no lymphadenopathy Cardiovascular: heart regular rate and rhythm, no murmur Lungs: clear  to auscultation bilaterally Abdomen: soft, nontender, nondistended, no obvious ascites, no peritoneal signs, normal bowel sounds, no organomegaly Rectal: Omitted Extremities: no clubbing, cyanosis, or lower extremity edema bilaterally Skin: no lesions on visible extremities Neuro: No focal deficits.  Cranial nerves intact  ASSESSMENT:  1.  Chronic diarrhea secondary to lymphocytic colitis 2.  History of adenomatous colon polyps.  Surveillance up-to-date 3.  GERD.  History of paraesophageal hernia repair.  EGD February 2021  revealed intact fundoplication.  Also mild gastritis.  Otherwise normal. 4.  Chronic nausea with a history of cyclic vomiting.  Requesting Phenergan refill 5.  Chronic cannabis use 6.  Multiple medical problems   PLAN:  1.  We discussed a short course of steroids to see if we can get her diarrhea under control.  Long-term, this is not an option (chronic steroids) due to side effect profile.  She understands. 2.  Prescribe prednisone 40 mg daily for 1 week, then 30 mg daily for 1 week, then 20 mg daily for 1 week, then 10 mg daily until follow-up.  Medication risks reviewed 3.  Office follow-up 6 weeks 4.  Refill Phenergan.  Once.  She should obtain future prescriptions from her PCP 5.  Surveillance colonoscopy 2032 A total time of 30 minutes was spent preparing to see the patient, reviewing test, laboratories, and pathology.  Obtaining comprehensive history and performing medically appropriate physical exam.  Counseling the patient regarding her above listed issues.  Prescribing multiple medications and arranging follow-up.  Finally, documenting clinical information in the health record

## 2021-06-21 ENCOUNTER — Telehealth: Payer: Self-pay | Admitting: Internal Medicine

## 2021-06-21 NOTE — Telephone Encounter (Signed)
Left message for pt to call back  °

## 2021-06-23 NOTE — Telephone Encounter (Signed)
Pt stated that she started taking the 10 mg of prednisone on the taper on Friday. Later that day she started to have nausea and loose stools. Pt stated that she had loose stools and N/V over the weekend. Pt stated that she started taking 2 pills (20 MG)  starting again on Monday and is having some improvement. Please advise

## 2021-06-23 NOTE — Telephone Encounter (Signed)
I advise that she stay on 20 mg of prednisone daily until her office appointment in 2 weeks

## 2021-06-24 NOTE — Telephone Encounter (Signed)
Pt made aware of Dr. Henrene Pastor recommendations.

## 2021-07-07 ENCOUNTER — Ambulatory Visit: Payer: Medicare Other | Admitting: Internal Medicine

## 2021-07-07 ENCOUNTER — Encounter: Payer: Self-pay | Admitting: Internal Medicine

## 2021-07-07 VITALS — BP 120/80 | HR 64 | Ht 65.25 in | Wt 112.2 lb

## 2021-07-07 DIAGNOSIS — K52832 Lymphocytic colitis: Secondary | ICD-10-CM | POA: Diagnosis not present

## 2021-07-07 DIAGNOSIS — R197 Diarrhea, unspecified: Secondary | ICD-10-CM | POA: Diagnosis not present

## 2021-07-07 MED ORDER — PREDNISONE 20 MG PO TABS: ORAL_TABLET | ORAL | 2 refills | Status: DC

## 2021-07-07 NOTE — Progress Notes (Signed)
HISTORY OF PRESENT ILLNESS:  [name] is a 65 y.o. female with multiple medical problems including GERD, IBS, bipolar disorder, chronic headaches, prior stroke, cyclic vomiting, marijuana dependence, and history of paraesophageal hernia with repair (mesh and gastropexy November 2015, elsewhere).  Underwent surveillance colonoscopy Mar 08, 2021.  Complained of diarrhea 6 months duration prior.  Biopsies revealed lymphocytic colitis.  She was prescribed budesonide but did not start budesonide due to cost.  She was seen in the office May 27, 2021.  At that time she was having 5 or 6 watery bowel movements per day.  Understanding the risks, she was placed on prednisone 40 mg daily for 1 week to be tapered by 10 mg each week thereafter.  She tells me that within 3 to 5 days of initiating prednisone she had marked improvement in her bowel movements.  She was having 1-2 formed bowel movements per day with increased appetite and slight weight gain (3 pound since her last visit).  However, several days after tapering down to 10 mg she had recurrent issues with diarrhea.  She increased her prednisone to 20 mg daily as directed.  Within about 7 to 10 days her bowel habits have improved again.  Currently having 1-2 formed bowel movements per day.  Tolerating medication well.  REVIEW OF SYSTEMS:  All non-GI ROS negative unless otherwise stated in the HPI-essentially positive review of systems  Past Medical History:  Diagnosis Date   Abdominal pain    chronic, recurrent   Allergy    Bipolar affective disorder (H[facility])    Chronic headaches    Chronic pain    Colon polyps    adenomatous   Cyclical vomiting syndrome    Depression    Diverticulosis    Endometriosis    Fibrocystic breast    GERD (gastroesophageal reflux disease)    Hiatal hernia    IBS (irritable bowel syndrome)    Marijuana dependence ([place])    Status post repair of paraesophageal diaphragmatic hernia    Stroke ([name]) 12/2013    Past  Surgical History:  Procedure Laterality Date   ABDOMINAL HYSTERECTOMY  1990   BREAST LUMPECTOMY Bilateral    COLONOSCOPY  10/07/2015   Dr.Judee Griffith   FOOT FRACTURE SURGERY Right    x 3   HIATAL HERNIA REPAIR     NISSEN FUNDOPLICATION  123456   hiatal hernia repair   TOE SURGERY Left     Social History [name]  reports that she has never smoked. She has never used smokeless tobacco. She reports current drug use. Drug: Marijuana. She reports that she does not drink alcohol.  family history includes Brain cancer in her mother; Breast cancer in her paternal grandmother and sister; Diabetes in her mother and sister.  Allergies  Allergen Reactions   Dust Mite Extract    Lyrica [Pregabalin]     Cause bladder issues        PHYSICAL EXAMINATION: Vital signs: BP 120/80 (BP Location: Left Arm, Patient Position: Sitting, Cuff Size: Normal)   Pulse 64 Comment: irregular  Ht 5' 5.25" (1.657 m) Comment: height measured without shoes  Wt 112 lb 4 oz (50.9 kg)   BMI 18.54 kg/m   Constitutional: Thin, unhealthy appearing, no acute distress Psychiatric: alert and oriented x3, cooperative Eyes: extraocular movements intact, anicteric, conjunctiva pink Mouth: oral pharynx moist, no lesions Neck: supple no lymphadenopathy Cardiovascular: heart regular rate and rhythm, no murmur Lungs: clear to auscultation bilaterally Abdomen: soft, nontender, nondistended, no obvious  ascites, no peritoneal signs, normal bowel sounds, no organomegaly Rectal: Omitted Extremities: no lower extremity edema bilaterally Skin: no lesions on visible extremities Neuro: No focal deficits.  Cranial nerves intact  ASSESSMENT:  1.  Lymphocytic colitis.  Responded well to prednisone but relapsed at 10 mg daily dose.  Now asymptomatic on 20 mg daily dose 2.  History of adenomatous colon polyps.  Surveillance up-to-date 3.  GERD with a history of paraesophageal hernia repair. 4.  Chronic nausea with history of  cyclic vomiting. 5.  Chronic cannabis use 6.  Multiple significant medical problems   PLAN:  1.  Prednisone 20 mg daily for 4 weeks.  Then taper down to 10 mg daily.  Risks reviewed. 2.  Office follow-up in 6 weeks.  Contact the office in the interim for questions or problems 3.  Patient is going to work with her insurance company regarding the prospects of obtaining budesonide which has a lower side effect profile than prednisone. A total time of 30 minutes was spent preparing to see the patient, reviewing test, obtaining history, performing medically appropriate physical exam, counseling the patient regarding her above listed issues, ordering medications, documenting clinical information in the health record

## 2021-07-07 NOTE — Patient Instructions (Signed)
If you are age 64 or older, your body mass index should be between 23-30. Your Body mass index is 18.54 kg/m. If this is out of the aforementioned range listed, please consider follow up with your Primary Care Provider.  If you are age 25 or younger, your body mass index should be between 19-25. Your Body mass index is 18.54 kg/m. If this is out of the aformentioned range listed, please consider follow up with your Primary Care Provider.   __________________________________________________________  The  GI providers would like to encourage you to use [facility] to communicate with providers for non-urgent requests or questions.  Due to long hold times on the telephone, sending your provider a message by [facility] may be a faster and more efficient way to get a response.  Please allow 48 business hours for a response.  Please remember that this is for non-urgent requests.   We have sent the following medications to your pharmacy for you to pick up at your convenience:  Prednisone  Please follow up on:________________________

## 2021-07-19 ENCOUNTER — Encounter (HOSPITAL_COMMUNITY): Payer: Self-pay

## 2021-07-19 ENCOUNTER — Other Ambulatory Visit: Payer: Self-pay

## 2021-07-19 ENCOUNTER — Emergency Department (HOSPITAL_COMMUNITY)
Admission: EM | Admit: 2021-07-19 | Discharge: 2021-07-19 | Discharge status: Against Medical Advice | Payer: Medicare Other | Attending: Emergency Medicine | Admitting: Emergency Medicine

## 2021-07-19 DIAGNOSIS — F3112 Bipolar disorder, current episode manic without psychotic features, moderate: Secondary | ICD-10-CM | POA: Diagnosis not present

## 2021-07-19 DIAGNOSIS — F32A Depression, unspecified: Secondary | ICD-10-CM

## 2021-07-19 DIAGNOSIS — Z79899 Other long term (current) drug therapy: Secondary | ICD-10-CM | POA: Diagnosis not present

## 2021-07-19 DIAGNOSIS — Z7982 Long term (current) use of aspirin: Secondary | ICD-10-CM | POA: Diagnosis not present

## 2021-07-19 DIAGNOSIS — F439 Reaction to severe stress, unspecified: Secondary | ICD-10-CM

## 2021-07-19 DIAGNOSIS — Z733 Stress, not elsewhere classified: Secondary | ICD-10-CM | POA: Diagnosis not present

## 2021-07-19 DIAGNOSIS — F419 Anxiety disorder, unspecified: Secondary | ICD-10-CM

## 2021-07-19 LAB — COMPREHENSIVE METABOLIC PANEL
ALT: 22 U/L (ref 0–44)
AST: 19 U/L (ref 15–41)
Albumin: 3.3 g/dL — ABNORMAL LOW (ref 3.5–5.0)
Alkaline Phosphatase: 58 U/L (ref 38–126)
Anion gap: 10 (ref 5–15)
BUN: 26 mg/dL — ABNORMAL HIGH (ref 8–23)
CO2: 25 mmol/L (ref 22–32)
Calcium: 9 mg/dL (ref 8.9–10.3)
Chloride: 104 mmol/L (ref 98–111)
Creatinine, Ser: 0.9 mg/dL (ref 0.44–1.00)
GFR, Estimated: >60 mL/min (ref >60)
Glucose, Bld: 93 mg/dL (ref 70–99)
Potassium: 4 mmol/L (ref 3.5–5.1)
Sodium: 139 mmol/L (ref 135–145)
Total Bilirubin: 0.6 mg/dL (ref 0.3–1.2)
Total Protein: 5.6 g/dL — ABNORMAL LOW (ref 6.5–8.1)

## 2021-07-19 LAB — CBC WITH DIFFERENTIAL/PLATELET
Abs Immature Granulocytes: 0.22 10*3/uL — ABNORMAL HIGH (ref 0.00–0.07)
Basophils Absolute: 0.1 10*3/uL (ref 0.0–0.1)
Basophils Relative: 0 %
Eosinophils Absolute: 0 10*3/uL (ref 0.0–0.5)
Eosinophils Relative: 0 %
HCT: 36.9 % (ref 36.0–46.0)
Hemoglobin: 12.9 g/dL (ref 12.0–15.0)
Immature Granulocytes: 1 %
Lymphocytes Relative: 17 %
Lymphs Abs: 3.4 10*3/uL (ref 0.7–4.0)
MCH: 35.3 pg — ABNORMAL HIGH (ref 26.0–34.0)
MCHC: 35 g/dL (ref 30.0–36.0)
MCV: 101.1 fL — ABNORMAL HIGH (ref 80.0–100.0)
Monocytes Absolute: 1 10*3/uL (ref 0.1–1.0)
Monocytes Relative: 5 %
Neutro Abs: 14.9 10*3/uL — ABNORMAL HIGH (ref 1.7–7.7)
Neutrophils Relative %: 77 %
Platelets: 397 10*3/uL (ref 150–400)
RBC: 3.65 MIL/uL — ABNORMAL LOW (ref 3.87–5.11)
RDW: 12.7 % (ref 11.5–15.5)
WBC: 19.7 10*3/uL — ABNORMAL HIGH (ref 4.0–10.5)
nRBC: 0.1 % (ref 0.0–0.2)

## 2021-07-19 LAB — ETHANOL: Alcohol, Ethyl (B): <10 mg/dL (ref <10)

## 2021-07-19 MED ORDER — GABAPENTIN 300 MG PO CAPS: 900.0000 mg | ORAL_CAPSULE | Freq: Three times a day (TID) | ORAL | Status: DC | PRN

## 2021-07-19 MED ORDER — HYDROCODONE-ACETAMINOPHEN 5-325 MG PO TABS: 1.0000 | ORAL_TABLET | Freq: Four times a day (QID) | ORAL | Status: DC | PRN

## 2021-07-19 MED ORDER — LAMOTRIGINE 150 MG PO TABS: 150.0000 mg | ORAL_TABLET | Freq: Every day | ORAL | Status: DC

## 2021-07-19 MED ORDER — TIZANIDINE HCL 4 MG PO TABS: 4.0000 mg | ORAL_TABLET | Freq: Three times a day (TID) | ORAL | Status: DC

## 2021-07-19 MED ORDER — ASPIRIN EC 81 MG PO TBEC: 81.0000 mg | DELAYED_RELEASE_TABLET | Freq: Every day | ORAL | Status: DC

## 2021-07-19 MED ORDER — PREDNISONE 20 MG PO TABS: 20.0000 mg | ORAL_TABLET | Freq: Every day | ORAL | Status: DC

## 2021-07-19 MED ORDER — PRAMIPEXOLE DIHYDROCHLORIDE 1.5 MG PO TABS
1.5000 mg | ORAL_TABLET | Freq: Every day | ORAL | Status: DC
  Filled 2021-07-19: qty 1

## 2021-07-19 MED ORDER — TOPIRAMATE 25 MG PO TABS: 25.0000 mg | ORAL_TABLET | Freq: Two times a day (BID) | ORAL | Status: DC

## 2021-07-19 MED ORDER — MOMETASONE FURO-FORMOTEROL FUM 200-5 MCG/ACT IN AERO: 2.0000 | INHALATION_SPRAY | Freq: Two times a day (BID) | RESPIRATORY_TRACT | Status: DC

## 2021-07-19 MED ORDER — PANTOPRAZOLE SODIUM 40 MG PO TBEC: 40.0000 mg | DELAYED_RELEASE_TABLET | Freq: Every day | ORAL | Status: DC

## 2021-07-19 MED ORDER — VENLAFAXINE HCL ER 75 MG PO CP24: 75.0000 mg | ORAL_CAPSULE | Freq: Every day | ORAL | Status: DC
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
## 2021-07-19 NOTE — BH Assessment (Signed)
@  0742, requested [place], RN and Altha Harm, RN via secure chat to place the TTS machine in patient's room to complete the initial TTS assessment.
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
## 2021-07-19 NOTE — ED Notes (Signed)
When going back to pt. Room pt was not in the room.  The pt verbalized understanding that she was not allowed to go outside and make phone calls and then  come back in.  Estimated 10 mins prior to going to room she was at the nursing desk asking to go outside to make a call about her foster dogs.  I explained that she could use our phone which the pt. Was handed already or that I would attempt to call from nurse phone if having issues getting out.
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
## 2021-07-21 ENCOUNTER — Other Ambulatory Visit: Payer: Self-pay

## 2021-07-21 ENCOUNTER — Ambulatory Visit (INDEPENDENT_AMBULATORY_CARE_PROVIDER_SITE_OTHER)
Admission: EM | Admit: 2021-07-21 | Discharge: 2021-07-22 | Disposition: A | Discharge status: Other Acute Inpatient Hospital | Payer: Medicare Other | Source: Home / Self Care

## 2021-07-21 DIAGNOSIS — F1290 Cannabis use, unspecified, uncomplicated: Secondary | ICD-10-CM | POA: Insufficient documentation

## 2021-07-21 DIAGNOSIS — Z8673 Personal history of transient ischemic attack (TIA), and cerebral infarction without residual deficits: Secondary | ICD-10-CM | POA: Insufficient documentation

## 2021-07-21 DIAGNOSIS — F1410 Cocaine abuse, uncomplicated: Secondary | ICD-10-CM | POA: Insufficient documentation

## 2021-07-21 DIAGNOSIS — F1910 Other psychoactive substance abuse, uncomplicated: Secondary | ICD-10-CM | POA: Insufficient documentation

## 2021-07-21 DIAGNOSIS — R519 Headache, unspecified: Secondary | ICD-10-CM | POA: Insufficient documentation

## 2021-07-21 DIAGNOSIS — R42 Dizziness and giddiness: Secondary | ICD-10-CM | POA: Insufficient documentation

## 2021-07-21 DIAGNOSIS — Z91410 Personal history of adult physical and sexual abuse: Secondary | ICD-10-CM | POA: Insufficient documentation

## 2021-07-21 DIAGNOSIS — F312 Bipolar disorder, current episode manic severe with psychotic features: Secondary | ICD-10-CM | POA: Insufficient documentation

## 2021-07-21 DIAGNOSIS — Z9151 Personal history of suicidal behavior: Secondary | ICD-10-CM | POA: Insufficient documentation

## 2021-07-22 ENCOUNTER — Emergency Department (HOSPITAL_COMMUNITY): Payer: Medicare Other

## 2021-07-22 ENCOUNTER — Encounter (HOSPITAL_COMMUNITY): Payer: Self-pay

## 2021-07-22 ENCOUNTER — Emergency Department (HOSPITAL_COMMUNITY)
Admission: EM | Admit: 2021-07-22 | Discharge: 2021-07-25 | Disposition: A | Discharge status: Home / Self Care | Payer: Medicare Other | Attending: Emergency Medicine | Admitting: Emergency Medicine

## 2021-07-22 DIAGNOSIS — F312 Bipolar disorder, current episode manic severe with psychotic features: Secondary | ICD-10-CM | POA: Insufficient documentation

## 2021-07-22 DIAGNOSIS — Y900 Blood alcohol level of less than 20 mg/100 ml: Secondary | ICD-10-CM | POA: Diagnosis not present

## 2021-07-22 DIAGNOSIS — F1910 Other psychoactive substance abuse, uncomplicated: Secondary | ICD-10-CM | POA: Diagnosis not present

## 2021-07-22 DIAGNOSIS — Z79899 Other long term (current) drug therapy: Secondary | ICD-10-CM | POA: Diagnosis not present

## 2021-07-22 DIAGNOSIS — F319 Bipolar disorder, unspecified: Secondary | ICD-10-CM | POA: Diagnosis present

## 2021-07-22 DIAGNOSIS — K589 Irritable bowel syndrome without diarrhea: Secondary | ICD-10-CM | POA: Diagnosis present

## 2021-07-22 DIAGNOSIS — F1410 Cocaine abuse, uncomplicated: Secondary | ICD-10-CM | POA: Insufficient documentation

## 2021-07-22 DIAGNOSIS — R42 Dizziness and giddiness: Secondary | ICD-10-CM | POA: Insufficient documentation

## 2021-07-22 DIAGNOSIS — Z20822 Contact with and (suspected) exposure to covid-19: Secondary | ICD-10-CM | POA: Diagnosis not present

## 2021-07-22 DIAGNOSIS — F314 Bipolar disorder, current episode depressed, severe, without psychotic features: Secondary | ICD-10-CM | POA: Diagnosis not present

## 2021-07-22 DIAGNOSIS — Z8673 Personal history of transient ischemic attack (TIA), and cerebral infarction without residual deficits: Secondary | ICD-10-CM | POA: Insufficient documentation

## 2021-07-22 DIAGNOSIS — Z9151 Personal history of suicidal behavior: Secondary | ICD-10-CM | POA: Insufficient documentation

## 2021-07-22 DIAGNOSIS — Z7982 Long term (current) use of aspirin: Secondary | ICD-10-CM | POA: Diagnosis not present

## 2021-07-22 DIAGNOSIS — R519 Headache, unspecified: Secondary | ICD-10-CM | POA: Insufficient documentation

## 2021-07-22 DIAGNOSIS — F1290 Cannabis use, unspecified, uncomplicated: Secondary | ICD-10-CM | POA: Insufficient documentation

## 2021-07-22 DIAGNOSIS — Z046 Encounter for general psychiatric examination, requested by authority: Secondary | ICD-10-CM | POA: Diagnosis present

## 2021-07-22 DIAGNOSIS — Z91410 Personal history of adult physical and sexual abuse: Secondary | ICD-10-CM | POA: Insufficient documentation

## 2021-07-22 DIAGNOSIS — F29 Unspecified psychosis not due to a substance or known physiological condition: Secondary | ICD-10-CM

## 2021-07-22 LAB — URINALYSIS, ROUTINE W REFLEX MICROSCOPIC
Bacteria, UA: NONE SEEN
Bilirubin Urine: NEGATIVE
Glucose, UA: NEGATIVE mg/dL
Ketones, ur: NEGATIVE mg/dL
Leukocytes,Ua: NEGATIVE
Nitrite: NEGATIVE
Protein, ur: NEGATIVE mg/dL
Specific Gravity, Urine: 1.015 (ref 1.005–1.030)
pH: 7 (ref 5.0–8.0)

## 2021-07-22 LAB — CBC WITH DIFFERENTIAL/PLATELET
Abs Immature Granulocytes: 0.13 10*3/uL — ABNORMAL HIGH (ref 0.00–0.07)
Basophils Absolute: 0.1 10*3/uL (ref 0.0–0.1)
Basophils Relative: 0 %
Eosinophils Absolute: 0 10*3/uL (ref 0.0–0.5)
Eosinophils Relative: 0 %
HCT: 40.6 % (ref 36.0–46.0)
Hemoglobin: 13.6 g/dL (ref 12.0–15.0)
Immature Granulocytes: 1 %
Lymphocytes Relative: 8 %
Lymphs Abs: 1.4 10*3/uL (ref 0.7–4.0)
MCH: 34.2 pg — ABNORMAL HIGH (ref 26.0–34.0)
MCHC: 33.5 g/dL (ref 30.0–36.0)
MCV: 102 fL — ABNORMAL HIGH (ref 80.0–100.0)
Monocytes Absolute: 0.2 10*3/uL (ref 0.1–1.0)
Monocytes Relative: 1 %
Neutro Abs: 15.8 10*3/uL — ABNORMAL HIGH (ref 1.7–7.7)
Neutrophils Relative %: 90 %
Platelets: 464 10*3/uL — ABNORMAL HIGH (ref 150–400)
RBC: 3.98 MIL/uL (ref 3.87–5.11)
RDW: 12.8 % (ref 11.5–15.5)
WBC: 17.6 10*3/uL — ABNORMAL HIGH (ref 4.0–10.5)
nRBC: 0 % (ref 0.0–0.2)

## 2021-07-22 LAB — COMPREHENSIVE METABOLIC PANEL
ALT: 27 U/L (ref 0–44)
AST: 31 U/L (ref 15–41)
Albumin: 4.6 g/dL (ref 3.5–5.0)
Alkaline Phosphatase: 70 U/L (ref 38–126)
Anion gap: 11 (ref 5–15)
BUN: 16 mg/dL (ref 8–23)
CO2: 25 mmol/L (ref 22–32)
Calcium: 9.5 mg/dL (ref 8.9–10.3)
Chloride: 103 mmol/L (ref 98–111)
Creatinine, Ser: 0.76 mg/dL (ref 0.44–1.00)
GFR, Estimated: >60 mL/min (ref >60)
Glucose, Bld: 117 mg/dL — ABNORMAL HIGH (ref 70–99)
Potassium: 4.4 mmol/L (ref 3.5–5.1)
Sodium: 139 mmol/L (ref 135–145)
Total Bilirubin: 0.8 mg/dL (ref 0.3–1.2)
Total Protein: 7.7 g/dL (ref 6.5–8.1)

## 2021-07-22 LAB — RAPID URINE DRUG SCREEN, HOSP PERFORMED
Amphetamines: NOT DETECTED
Barbiturates: NOT DETECTED
Benzodiazepines: NOT DETECTED
Cocaine: NOT DETECTED
Opiates: POSITIVE — AB
Tetrahydrocannabinol: POSITIVE — AB

## 2021-07-22 LAB — ACETAMINOPHEN LEVEL: Acetaminophen (Tylenol), Serum: <10 ug/mL — ABNORMAL LOW (ref 10–30)

## 2021-07-22 LAB — RESP PANEL BY RT-PCR (FLU A&B, COVID) ARPGX2
Influenza A by PCR: NEGATIVE
Influenza B by PCR: NEGATIVE
SARS Coronavirus 2 by RT PCR: NEGATIVE

## 2021-07-22 LAB — MAGNESIUM: Magnesium: 2.1 mg/dL (ref 1.7–2.4)

## 2021-07-22 LAB — ETHANOL: Alcohol, Ethyl (B): <10 mg/dL (ref <10)

## 2021-07-22 MED ORDER — OLANZAPINE 10 MG IM SOLR
2.5000 mg | Freq: Once | INTRAMUSCULAR | Status: AC
  Administered 2021-07-22: 2.5 mg via INTRAMUSCULAR
  Filled 2021-07-22: qty 10

## 2021-07-22 MED ORDER — STERILE WATER FOR INJECTION IJ SOLN
INTRAMUSCULAR | Status: AC
  Filled 2021-07-22: qty 10

## 2021-07-22 MED ORDER — OLANZAPINE 10 MG PO TBDP
10.0000 mg | ORAL_TABLET | Freq: Three times a day (TID) | ORAL | Status: DC | PRN
  Administered 2021-07-22 – 2021-07-23 (×2): 10 mg via ORAL
  Filled 2021-07-22 (×2): qty 1

## 2021-07-22 MED ORDER — LORAZEPAM 1 MG PO TABS
1.0000 mg | ORAL_TABLET | ORAL | Status: AC | PRN
  Administered 2021-07-22: 1 mg via ORAL
  Filled 2021-07-22: qty 1

## 2021-07-22 MED ORDER — ZIPRASIDONE MESYLATE 20 MG IM SOLR
20.0000 mg | INTRAMUSCULAR | Status: AC | PRN
  Administered 2021-07-23: 20 mg via INTRAMUSCULAR
  Filled 2021-07-22 (×2): qty 20

## 2021-07-22 MED ORDER — ZIPRASIDONE MESYLATE 20 MG IM SOLR
20.0000 mg | Freq: Once | INTRAMUSCULAR | Status: AC
  Administered 2021-07-22: 20 mg via INTRAMUSCULAR
  Filled 2021-07-22: qty 20

## 2021-07-22 MED ORDER — DICYCLOMINE HCL 10 MG PO CAPS
10.0000 mg | ORAL_CAPSULE | Freq: Once | ORAL | Status: AC
  Administered 2021-07-22: 10 mg via ORAL
  Filled 2021-07-22: qty 1

## 2021-07-22 MED ORDER — PREDNISONE 20 MG PO TABS
20.0000 mg | ORAL_TABLET | Freq: Every day | ORAL | Status: DC
  Administered 2021-07-22 – 2021-07-23 (×2): 20 mg via ORAL
  Filled 2021-07-22 (×2): qty 1
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
## 2021-07-22 NOTE — ED Notes (Signed)
Pt clogged up the bathroom toilet with paper towels, ripped open alcohol wipes, and threw them all over the floor.

## 2021-07-22 NOTE — ED Notes (Signed)
Pt continues to curse at staff members and is now kicking the exam room chair
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
## 2021-07-22 NOTE — ED Notes (Signed)
Pt threw herself to the floor and is refusing to get up. Pt is refusing help from staff.

## 2021-07-22 NOTE — ED Notes (Signed)
Pt ambulated to restroom without assistance.

## 2021-07-22 NOTE — ED Notes (Signed)
Pt trashed her room.

## 2021-07-22 NOTE — ED Provider Notes (Signed)
[facility] DEPT Provider Note   CSN: [id number] Arrival date & time: 07/22/21  0105     History Chief Complaint  Patient presents with   IVC    [name] is a 65 y.o. female.  Patient presenting to the ED in transfer from [facility] for medical clearance. Hx of bipolar affective disorder, cocaine abuse, depression. Presenting for evaluation with rapid, pressured and tangential speech. Difficult to redirect. Appeared to be exhibiting paranoid ideation while being examined at [facility]. Based on patient's current presentation, patient believed to be experiencing mania, delusions, and psychosis, that is severely negatively impacting the patient's ability to function in her activities of daily living. She has been recommended for inpatient psychiatric treatment. IVC completed by husband prior to arrival.  The history is provided by the patient. No language interpreter was used.      Past Medical History:  Diagnosis Date   Abdominal pain    chronic, recurrent   Allergy    Bipolar affective disorder (HCC)    Chronic headaches    Chronic pain    Colon polyps    adenomatous   Cyclical vomiting syndrome    Depression    Diverticulosis    Endometriosis    Fibrocystic breast    GERD (gastroesophageal reflux disease)    Hiatal hernia    IBS (irritable bowel syndrome)    Marijuana dependence ([place])    Status post repair of paraesophageal diaphragmatic hernia    Stroke ([place]) 12/2013    Patient Active Problem List   Diagnosis Date Noted   Chest pain 01/07/2020   Chronic pain 01/07/2020   Hypokalemia    LVH (left ventricular hypertrophy)    Hyperlipidemia 12/03/2019   Atypical chest pain 12/03/2019   Nonspecific abnormal electrocardiogram (ECG) (EKG) 81/27/5170   Cyclical vomiting with nausea 01/74/9449   Cyclic vomiting syndrome 04/03/2014   CVA (cerebral infarction) 01/29/2014   Polysubstance abuse ([place]) 01/29/2014   Intractable nausea and  vomiting 11/16/2013   N&V (nausea and vomiting) 02/03/2013   Bipolar affective disorder ([place])    GERD (gastroesophageal reflux disease)    COCAINE ABUSE, 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     Family History  Problem Relation Age of Onset   Brain cancer Mother    Diabetes Mother    Breast cancer Sister    Breast cancer Paternal Grandmother    Diabetes Sister        x 2   Colon cancer Neg Hx    Esophageal cancer Neg Hx    Stomach cancer Neg Hx    Rectal cancer Neg Hx     Social History   Tobacco Use   Smoking status: Never   Smokeless tobacco: Never  Vaping Use   Vaping Use: Never used  Substance Use Topics   Alcohol use: No    Alcohol/week: 0.0 standard drinks   Drug use: Yes  Types: Marijuana    Comment: smokes daily     Home Medications Prior to Admission medications   Medication Sig Start Date End Date Taking? Authorizing Provider  aspirin EC 81 MG tablet Take 1 tablet (81 mg total) by mouth daily. Swallow whole. 12/10/20   Garvin Fila, MD  budesonide-formoterol [facility]) 160-4.5 MCG/ACT inhaler Inhale 2 puffs into the lungs 2 (two) times daily.    [provider]  diclofenac sodium (VOLTAREN) 1 % GEL Apply 1 g topically 3 (three) times daily as needed (back and elbow pain).     [provider]  dicyclomine (BENTYL) 20 MG tablet Take 1 tablet (20 mg total) by mouth 2 (two) times daily. 10/22/19   Lacretia Leigh, MD  furosemide (LASIX) 40 MG tablet Take 40 mg  by mouth as needed for fluid.  12/13/18   [provider]  gabapentin (NEURONTIN) 300 MG capsule Take 900 mg by mouth 3 (three) times daily as needed (nerve pain). 04/23/15   [provider]  HYDROcodone-acetaminophen (NORCO/VICODIN) 5-325 MG tablet Take 1 tablet by mouth 4 (four) times daily as needed for moderate pain. 11/29/19   [provider]  lamoTRIgine (LAMICTAL) 150 MG tablet Take 150 mg by mouth at bedtime.    [provider]  ondansetron (ZOFRAN ODT) 4 MG disintegrating tablet Take 1 tablet (4 mg total) by mouth every 8 (eight) hours as needed for nausea. 11/21/20   Mesner, Corene Cornea, MD  pantoprazole (PROTONIX) 40 MG tablet Take 1 tablet (40 mg total) by mouth daily. 12/14/15   Irene Shipper, MD  potassium chloride SA (K-DUR,KLOR-CON) 20 MEQ tablet Take 2 tablets (40 mEq total) by mouth 2 (two) times daily for 10 days. 02/08/19 11/20/28  Kinnie Feil, PA-C  pramipexole (MIRAPEX) 1.5 MG tablet Take 1.5 mg by mouth daily.    [provider]  predniSONE (DELTASONE) 20 MG tablet Take one tablet (20mg ) by mouth daily for 4 weeks then take 1/2 tablet (10mg ) daily 07/07/21   Irene Shipper, MD  promethazine (PHENERGAN) 25 MG suppository INSERT 1 SUPPOSITORY INTO RECTUM EVERY 8 HOURS AS NEEDED FOR NAUSEA OR VOMITING 11/21/20   Mesner, Corene Cornea, MD  promethazine (PHENERGAN) 25 MG tablet Take 1 tablet (25 mg total) by mouth every 6 (six) hours as needed for nausea or vomiting. 05/27/21   Irene Shipper, MD  sucralfate (CARAFATE) 1 g tablet Take 1 g by mouth 4 (four) times daily -  with meals and at bedtime.    [provider]  tiZANidine (ZANAFLEX) 4 MG tablet Take 4 mg by mouth 3 (three) times daily. 12/13/18   [provider]  topiramate (TOPAMAX) 25 MG tablet Take 1 tablet (25 mg total) by mouth 2 (two) times daily. 02/25/21   Garvin Fila, MD  venlafaxine XR (EFFEXOR-XR) 75 MG 24 hr capsule Take 75 mg by mouth at bedtime.     [provider]    Allergies    Dust mite extract and Lyrica [pregabalin]  Review of Systems   Review of Systems  Unable to perform ROS: Psychiatric disorder   Physical Exam Updated Vital Signs BP (!) 140/109 (BP Location: Left Arm)   Pulse 92   Temp 98.4 F (36.9 C) (Oral)   Resp 18   Ht 5\' 5"  (1.651 m)   Wt 49.9 kg   SpO2 97%   BMI 18.30 kg/m   Physical Exam Vitals and nursing note reviewed.  Constitutional:  General: She is not in acute distress.    Appearance: She is well-developed. She is not diaphoretic.  HENT:     Head: Normocephalic and atraumatic.  Eyes:     General: No scleral icterus.    Conjunctiva/sclera: Conjunctivae normal.  Pulmonary:     Effort: Pulmonary effort is normal. No respiratory distress.  Musculoskeletal:        General: Normal range of motion.     Cervical back: Normal range of motion.  Skin:    General: Skin is warm and dry.     Coloration: Skin is not pale.     Findings: No erythema or rash.  Neurological:     Mental Status: She is alert and oriented to person, place, and time.  Psychiatric:        Attention and Perception: She does not perceive auditory or visual hallucinations.        Speech: Speech is rapid and pressured and tangential.        Behavior: Behavior is hyperactive.    ED Results / Procedures / Treatments   Labs (all labs ordered are listed, but only abnormal results are displayed) Labs Reviewed  ACETAMINOPHEN LEVEL - Abnormal; Notable for the following components:      Result Value   Acetaminophen (Tylenol), Serum <10 (*)    All other components within normal limits  CBC WITH DIFFERENTIAL/PLATELET - Abnormal; Notable for the following components:   WBC 17.6 (*)    MCV 102.0 (*)    MCH 34.2 (*)    Platelets 464 (*)    Neutro Abs 15.8 (*)    Abs Immature Granulocytes 0.13 (*)    All other components within normal limits  COMPREHENSIVE METABOLIC PANEL - Abnormal; Notable for the following components:   Glucose, Bld  117 (*)    All other components within normal limits  URINALYSIS, ROUTINE W REFLEX MICROSCOPIC - Abnormal; Notable for the following components:   Hgb urine dipstick TRACE (*)    All other components within normal limits  RAPID URINE DRUG SCREEN, HOSP PERFORMED - Abnormal; Notable for the following components:   Opiates POSITIVE (*)    Tetrahydrocannabinol POSITIVE (*)    All other components within normal limits  RESP PANEL BY RT-PCR (FLU A&B, COVID) ARPGX2  ETHANOL  MAGNESIUM    EKG None  Radiology No results found.  Procedures Procedures   Medications Ordered in ED Medications  OLANZapine (ZYPREXA) injection 2.5 mg (has no administration in time range)    ED Course  I have reviewed the triage vital signs and the nursing notes.  Pertinent labs & imaging results that were available during my care of the patient were reviewed by me and considered in my medical decision making (see chart for details).    MDM Rules/Calculators/A&P                           65 year old female is pending inpatient placement for psychiatric treatment of suspected psychosis.  Initially assessed at [facility].  IVC papers were taken out by husband.  First examination has been completed and patient medically cleared.  Disposition to be determined by oncoming ED provider.   Final Clinical Impression(s) / ED Diagnoses Final diagnoses:  Psychosis, unspecified psychosis type [facility])    Rx / [place] Orders ED Discharge Orders     None        Antonietta Breach, PA-C 07/22/21 0546    Ezequiel Essex, MD 07/22/21 [phone]

## 2021-07-22 NOTE — ED Notes (Signed)
Patient does not want any information given to husband or sister

## 2021-07-22 NOTE — Discharge Instructions (Addendum)
Patient to be transferred to [facility] ED for medical clearance for inpatient psychiatric treatment °

## 2021-07-22 NOTE — ED Notes (Signed)
[employer] Transport requested. 

## 2021-07-22 NOTE — ED Notes (Addendum)
Pt layed in the floor stating she has dumping syndrome. Pt states that she fell on the way to the restroom. Pt placed herself in the floor witnessed by several staff members. Pt has been ambulatory without assistance walking to the restroom and the triage desk multiple times.
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
## 2021-07-22 NOTE — ED Notes (Signed)
Pt states that everyone is going to feel her pain because they aren't listening to her.

## 2021-07-22 NOTE — BH Assessment (Signed)
[facility] Assessment Progress Note   Per [name], PA, this pt requires psychiatric hospitalization at this time.  Pt presents under IVC initiated by pt's husband and upheld by EDP Ezequiel Essex, MD.  The following facilities have been contacted to seek placement for this pt, with results as noted:  Beds available, information sent, decision pending: [facility], [place] Coordinator [phone]

## 2021-07-22 NOTE — ED Notes (Signed)
Pt has taken all the trash in the restroom and tried to flush it clogging the toilet. Pt has also pulled all the trash, gloves and paper towels throwing them around the room. Pt is also flipping off and screaming obscenities at staff.

## 2021-07-22 NOTE — Progress Notes (Signed)
[place]  Pt meets inpatient criteria per [name], PA.  Referral was sent to the following facilities:   [facility]  [address] [place] [address] [phone] [facility]  [address] [place] [address] [phone]  [facility]  [place], [place] [address] [phone]  [facility]  [address], [place] [address] [phone]  [facility]  [address] [place] [address] [phone] [facility]  [address], [place] [address] [phone]  [facility]  [address], [place] [address] [phone] [place]  [address], [place] [address] [phone]  [facility]  [address], [place] [address] [phone]  [facility]  [address] [place] [address] [phone]   Situation ongoing,  CSW will follow up.   Benjaman Kindler, MSW, [facility] 07/22/2021  @ 11:16 PM

## 2021-07-23 ENCOUNTER — Telehealth: Payer: Self-pay | Admitting: Internal Medicine

## 2021-07-23 DIAGNOSIS — F319 Bipolar disorder, unspecified: Secondary | ICD-10-CM | POA: Diagnosis not present

## 2021-07-23 MED ORDER — OLANZAPINE 10 MG PO TBDP
10.0000 mg | ORAL_TABLET | Freq: Every day | ORAL | Status: DC
  Administered 2021-07-23 – 2021-07-24 (×2): 10 mg via ORAL
  Filled 2021-07-23: qty 1

## 2021-07-23 MED ORDER — EZETIMIBE 10 MG PO TABS
10.0000 mg | ORAL_TABLET | Freq: Every day | ORAL | Status: DC
  Administered 2021-07-23 – 2021-07-24 (×2): 10 mg via ORAL
  Filled 2021-07-23 (×2): qty 1

## 2021-07-23 MED ORDER — PROMETHAZINE HCL 25 MG PO TABS
25.0000 mg | ORAL_TABLET | Freq: Four times a day (QID) | ORAL | Status: DC | PRN
  Administered 2021-07-25: 25 mg via ORAL
  Filled 2021-07-23: qty 1

## 2021-07-23 MED ORDER — DIPHENHYDRAMINE HCL 25 MG PO CAPS: 25.0000 mg | ORAL_CAPSULE | Freq: Four times a day (QID) | ORAL | Status: DC | PRN

## 2021-07-23 MED ORDER — METOPROLOL TARTRATE 25 MG PO TABS
12.5000 mg | ORAL_TABLET | Freq: Two times a day (BID) | ORAL | Status: DC
  Administered 2021-07-23 – 2021-07-24 (×4): 12.5 mg via ORAL
  Filled 2021-07-23 (×2): qty 1
  Filled 2021-07-23: qty 0.5
  Filled 2021-07-23 (×2): qty 1

## 2021-07-23 MED ORDER — LORAZEPAM 1 MG PO TABS
1.0000 mg | ORAL_TABLET | ORAL | Status: AC | PRN
  Administered 2021-07-24: 1 mg via ORAL
  Filled 2021-07-23: qty 1

## 2021-07-23 MED ORDER — STERILE WATER FOR INJECTION IJ SOLN
INTRAMUSCULAR | Status: AC
  Administered 2021-07-23: 1.2 mL
  Filled 2021-07-23: qty 10

## 2021-07-23 MED ORDER — TOPIRAMATE 25 MG PO TABS
25.0000 mg | ORAL_TABLET | Freq: Two times a day (BID) | ORAL | Status: DC
  Administered 2021-07-23 – 2021-07-24 (×4): 25 mg via ORAL
  Filled 2021-07-23 (×4): qty 1

## 2021-07-23 MED ORDER — HYDROCODONE-ACETAMINOPHEN 5-325 MG PO TABS
1.0000 | ORAL_TABLET | Freq: Four times a day (QID) | ORAL | Status: DC | PRN
  Administered 2021-07-25: 1 via ORAL
  Filled 2021-07-23: qty 1

## 2021-07-23 MED ORDER — OLANZAPINE 10 MG PO TBDP
10.0000 mg | ORAL_TABLET | Freq: Three times a day (TID) | ORAL | Status: DC | PRN
  Filled 2021-07-23: qty 1

## 2021-07-23 MED ORDER — SIMVASTATIN 10 MG PO TABS
10.0000 mg | ORAL_TABLET | Freq: Every day | ORAL | Status: DC
  Administered 2021-07-23 – 2021-07-24 (×2): 10 mg via ORAL
  Filled 2021-07-23 (×2): qty 1

## 2021-07-23 MED ORDER — MELATONIN 5 MG PO TABS
5.0000 mg | ORAL_TABLET | Freq: Every evening | ORAL | Status: DC | PRN
  Administered 2021-07-24: 5 mg via ORAL
  Filled 2021-07-23: qty 1

## 2021-07-23 MED ORDER — PREDNISONE 20 MG PO TABS
10.0000 mg | ORAL_TABLET | Freq: Every day | ORAL | Status: AC
  Administered 2021-07-24: 10 mg via ORAL
  Filled 2021-07-23: qty 1

## 2021-07-23 MED ORDER — ZIPRASIDONE MESYLATE 20 MG IM SOLR
20.0000 mg | INTRAMUSCULAR | Status: AC | PRN
Start: 2021-07-23 — End: 2021-07-23
  Administered 2021-07-23: 20 mg via INTRAMUSCULAR
  Filled 2021-07-23: qty 20

## 2021-07-23 MED ORDER — EZETIMIBE-SIMVASTATIN 10-10 MG PO TABS: 1.0000 | ORAL_TABLET | Freq: Every day | ORAL | Status: DC

## 2021-07-23 MED ORDER — GABAPENTIN 100 MG PO CAPS
100.0000 mg | ORAL_CAPSULE | Freq: Three times a day (TID) | ORAL | Status: DC | PRN
  Administered 2021-07-24 – 2021-07-25 (×3): 100 mg via ORAL
  Filled 2021-07-23 (×3): qty 1

## 2021-07-23 MED ORDER — PREDNISONE 5 MG PO TABS
5.0000 mg | ORAL_TABLET | Freq: Every day | ORAL | Status: DC
  Administered 2021-07-25: 5 mg via ORAL
  Filled 2021-07-23: qty 1

## 2021-07-23 MED ORDER — MOMETASONE FURO-FORMOTEROL FUM 200-5 MCG/ACT IN AERO
2.0000 | INHALATION_SPRAY | Freq: Two times a day (BID) | RESPIRATORY_TRACT | Status: DC
  Administered 2021-07-23 – 2021-07-25 (×5): 2 via RESPIRATORY_TRACT
  Filled 2021-07-23: qty 8.8

## 2021-07-23 MED ORDER — PANTOPRAZOLE SODIUM 40 MG PO TBEC
40.0000 mg | DELAYED_RELEASE_TABLET | Freq: Every day | ORAL | Status: DC
  Administered 2021-07-23 – 2021-07-24 (×2): 40 mg via ORAL
  Filled 2021-07-23 (×2): qty 1

## 2021-07-23 NOTE — ED Notes (Signed)
Pt placed lunch tray outside door and did not eat anything

## 2021-07-23 NOTE — ED Notes (Signed)
Provider aware of pt blood pressure fluctuations.

## 2021-07-23 NOTE — BH Assessment (Addendum)
[facility] Assessment Progress Note   Per Oneida Alar, NP this involuntary pt continues to require psychiatric hospitalization.  This Probation officer called the following facilities to follow up on referrals with results as noted:  [facility] - left message at 15:56; response pending [facility] - at [place] - left message at 15:58; response pending [facility] - declined [facility] - left message at 16:18 [facility] - at [place] - at [place] - left message at 15:53  Jalene Mullet, [facility] Coordinator [phone]

## 2021-07-23 NOTE — ED Notes (Signed)
Report received from Shannon, RN

## 2021-07-23 NOTE — ED Notes (Signed)
Pt called RN to room and wanted to talk about her meds. Pt continues to state that we are not helping her and not providing the care that she knows she needs. Writer went over pts meds and explained to pt that our providers are working to care for her and get her the help she needs.

## 2021-07-23 NOTE — Telephone Encounter (Signed)
Left message for pt that for her to get the med in the hosp she needs to speak to the MD that is taking care of her at the hospital.

## 2021-07-23 NOTE — ED Provider Notes (Addendum)
Emergency Medicine Observation Re-evaluation Note  [name] is a 65 y.o. female, seen on rounds today.  Pt initially presented to the ED for complaints of IVC Currently, the patient is Waiting for inpatient psychiatric hospitalization.  Physical Exam  BP (!) 148/90 (BP Location: Right Arm)   Pulse 96   Temp 97.9 F (36.6 C) (Oral)   Resp 18   Ht 1.651 m (5\' 5" )   Wt 49.9 kg   SpO2 100%   BMI 18.30 kg/m  Physical Exam General: Sleeping Cardiac: Regular rate Lungs: No respiratory difficulty Psych: Calm mood, resting  ED Course / MDM  EKG:   I have reviewed the labs performed to date as well as medications administered while in observation.  Recent changes in the last 24 hours include Episodes of agitation and frustration..  Plan  Current plan is for inpatient psychiatric treatment.  [name] is not under involuntary commitment.     Dorie Rank, MD 07/23/21 [phone] Patient had multiple concerns including not getting all of her prescribed medications.  I will review her records and reviewed her medication list again.  Patient did state that she was on chronic steroids.  This certainly could account for her leukocytosis   Dorie Rank, MD 07/23/21 1334

## 2021-07-23 NOTE — ED Notes (Signed)
Pt continues to come to do asking to see a medical dr. Provider aware.

## 2021-07-23 NOTE — ED Notes (Signed)
Patient continues to come to door requesting scrub tops and medication. RN told patient that there was nothing ordered and that a message was sent to MD. Patient is not wearing a shirt and continues to come to door without a shirt after NT has already given her 3 scrub tops

## 2021-07-23 NOTE — ED Notes (Signed)
Pt calling outside dr's telling them that the MD's here are not giving her the medications she needs. Pt continues to ask to call her husband and was advised against doing so due to her being very agitated.
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
## 2021-07-23 NOTE — ED Notes (Addendum)
Patient is stating that her medical issues are not being addressed. She has asked for Bentyl for diarrhea. She also states she has hx. of CVA and thinks she is going to stroke out. Patient is not in any acute distress. Patient appears to be in a manic episode. She is restless and will not stop talking. Pressured speech and liable. MD notified

## 2021-07-23 NOTE — ED Notes (Signed)
Pt agitated and continues to state she wants to see a Engineer, structural. Meds given per [facility]

## 2021-07-23 NOTE — Consult Note (Signed)
[facility] Psychiatry Consult   Reason for Consult:  IVC Referring Physician:  [name], PA-C Patient Identification: [name] MRN:  [id number] Principal Diagnosis: Bipolar affective disorder [facility]) Diagnosis:  Principal Problem:   Bipolar affective disorder ([place]) Active Problems:   IBS   Total Time spent with patient: 20 minutes  Subjective:   [name] is a 65 y.o. female patient admitted with psychosis.  On assessment patient presents tangential, elevated, circumstantial and perseverative with rapid, pressured speech. Minimal insight. Flight of ideas. States she has not been taking psychiatric medications "for days". Currently takes prednisone 20mg  daily for IBS; WBC 17.6, Neutrophil 15.8. No medications for bipolar disorder noted on home medications. She denies any suicidal or homicidal ideations, auditory or visual hallucinations. Patient appear to be manic and at times possibly responding to some external/internal stimuli at the time of assessment.   Per chart review patient seen on 07/07/21 for lymphocytic colitis where she was started on Prednisone 20 mg daily for 4 weeks for IBS treatment due to increased liquid stools. Patient then presented to [facility] for "stress 07/19/21 where she was noted to have pressured unorganized speech and was psychiatrically cleared. Last psychiatric admission noted 09/03/2007; based on this information, steroid induced mania cannot be ruled out. Patient reviewed with attending physician [place] Abbot; Prednisone taper initiated.   HPI:   [name] is a 65 year old patient who initially presented to [facility] for assessment of psychotic behavior. Patient has past history of Irritable Bowel Syndrome, polysubstance abuse disorder, bipolar affective disorder. UDS+opiates, THC; BAL<10. PDMP reviewed, Hydrocodone-Acetaminophen 5-325 prescription given 07/14/21.   Past Psychiatric History:   -bipolar affective disorder  Risk to  Self:   Risk to Others:   Prior Inpatient Therapy:   Prior Outpatient Therapy:    Past Medical History:  Past Medical History:  Diagnosis Date   Abdominal pain    chronic, recurrent   Allergy    Bipolar affective disorder (HCC)    Chronic headaches    Chronic pain    Colon polyps    adenomatous   Cyclical vomiting syndrome    Depression    Diverticulosis    Endometriosis    Fibrocystic breast    GERD (gastroesophageal reflux disease)    Hiatal hernia    IBS (irritable bowel syndrome)    Marijuana dependence ([place])    Status post repair of paraesophageal diaphragmatic hernia    Stroke ([place]) 12/2013    Past Surgical History:  Procedure Laterality Date   ABDOMINAL HYSTERECTOMY  1990   BREAST LUMPECTOMY Bilateral    COLONOSCOPY  10/07/2015   Dr.Perry   FOOT FRACTURE SURGERY Right    x 3   HIATAL HERNIA REPAIR     NISSEN FUNDOPLICATION  54/5625   hiatal hernia repair   TOE SURGERY Left    Family History:  Family History  Problem Relation Age of Onset   Brain cancer Mother    Diabetes Mother    Breast cancer Sister    Breast cancer Paternal Grandmother    Diabetes Sister        x 2   Colon cancer Neg Hx    Esophageal cancer Neg Hx    Stomach cancer Neg Hx    Rectal cancer Neg Hx    Family Psychiatric  History: not noted Social History:  Social History   Substance and Sexual Activity  Alcohol Use No   Alcohol/week: 0.0 standard drinks     Social History  Substance and Sexual Activity  Drug Use Yes   Types: Marijuana   Comment: smokes daily     Social History   Socioeconomic History   Marital status: Married    Spouse name: Not on file   Number of children: 3   Years of education: Not on file   Highest education level: Not on file  Occupational History   Occupation: disabled  Tobacco Use   Smoking status: Never   Smokeless tobacco: Never  Vaping Use   Vaping Use: Never used  Substance and Sexual Activity   Alcohol use: No    Alcohol/week:  0.0 standard drinks   Drug use: Yes    Types: Marijuana    Comment: smokes daily    Sexual activity: Not on file  Other Topics Concern   Not on file  Social History Narrative   Lives with husband   Right handed   Drinks caffeinated tea some   Social Determinants of Health   Financial Resource Strain: Not on file  Food Insecurity: Not on file  Transportation Needs: Not on file  Physical Activity: Not on file  Stress: Not on file  Social Connections: Not on file   Additional Social History:    Allergies:   Allergies  Allergen Reactions   Dust Mite Extract    Lyrica [Pregabalin]     Cause bladder issues     Labs:  Results for orders placed or performed during the hospital encounter of 07/22/21 (from the past 48 hour(s))  Ethanol     Status: None   Collection Time: 07/22/21  1:53 AM  Result Value Ref Range   Alcohol, Ethyl (B) <10 <10 mg/dL    Comment: (NOTE) Lowest detectable limit for serum alcohol is 10 mg/dL.  For medical purposes only. Performed at [facility], [place] [address], [place], [place] [address]   Acetaminophen level     Status: Abnormal   Collection Time: 07/22/21  1:53 AM  Result Value Ref Range   Acetaminophen (Tylenol), Serum <10 (L) 10 - 30 ug/mL    Comment: (NOTE) Therapeutic concentrations vary significantly. A range of 10-30 ug/mL  may be an effective concentration for many patients. However, some  are best treated at concentrations outside of this range. Acetaminophen concentrations >150 ug/mL at 4 hours after ingestion  and >50 ug/mL at 12 hours after ingestion are often associated with  toxic reactions.  Performed at [facility], [place] [address] [place],  [address]   CBC with Differential     Status: Abnormal   Collection Time: 07/22/21  1:53 AM  Result Value Ref Range   WBC 17.6 (H) 4.0 - 10.5 K/uL   RBC 3.98 3.87 - 5.11 MIL/uL   Hemoglobin 13.6 12.0 - 15.0 g/dL   HCT 40.6 36.0 - 46.0  %   MCV 102.0 (H) 80.0 - 100.0 fL   MCH 34.2 (H) 26.0 - 34.0 pg   MCHC 33.5 30.0 - 36.0 g/dL   RDW 12.8 11.5 - 15.5 %   Platelets 464 (H) 150 - 400 K/uL   nRBC 0.0 0.0 - 0.2 %   Neutrophils Relative % 90 %   Neutro Abs 15.8 (H) 1.7 - 7.7 K/uL   Lymphocytes Relative 8 %   Lymphs Abs 1.4 0.7 - 4.0 K/uL   Monocytes Relative 1 %   Monocytes Absolute 0.2 0.1 - 1.0 K/uL   Eosinophils Relative 0 %   Eosinophils Absolute 0.0 0.0 - 0.5 K/uL  Basophils Relative 0 %   Basophils Absolute 0.1 0.0 - 0.1 K/uL   Immature Granulocytes 1 %   Abs Immature Granulocytes 0.13 (H) 0.00 - 0.07 K/uL    Comment: Performed at [facility], [place] [address], [place], [place] [address]  Comprehensive metabolic panel     Status: Abnormal   Collection Time: 07/22/21  1:53 AM  Result Value Ref Range   Sodium 139 135 - 145 mmol/L   Potassium 4.4 3.5 - 5.1 mmol/L   Chloride 103 98 - 111 mmol/L   CO2 25 22 - 32 mmol/L   Glucose, Bld 117 (H) 70 - 99 mg/dL    Comment: Glucose reference range applies only to samples taken after fasting for at least 8 hours.   BUN 16 8 - 23 mg/dL   Creatinine, Ser 0.76 0.44 - 1.00 mg/dL   Calcium 9.5 8.9 - 10.3 mg/dL   Total Protein 7.7 6.5 - 8.1 g/dL   Albumin 4.6 3.5 - 5.0 g/dL   AST 31 15 - 41 U/L   ALT 27 0 - 44 U/L   Alkaline Phosphatase 70 38 - 126 U/L   Total Bilirubin 0.8 0.3 - 1.2 mg/dL   GFR, Estimated >60 >60 mL/min    Comment: (NOTE) Calculated using the CKD-EPI Creatinine Equation (2021)    Anion gap 11 5 - 15    Comment: Performed at [facility], [place] [address] [place], [place] [address]  Magnesium     Status: None   Collection Time: 07/22/21  1:53 AM  Result Value Ref Range   Magnesium 2.1 1.7 - 2.4 mg/dL    Comment: Performed at [facility], [place] [address], [place], [place] [address]  Urinalysis, Routine w reflex microscopic Urine, Clean Catch     Status: Abnormal   Collection Time: 07/22/21  3:02  AM  Result Value Ref Range   Color, Urine YELLOW YELLOW   APPearance CLEAR CLEAR   Specific Gravity, Urine 1.015 1.005 - 1.030   pH 7.0 5.0 - 8.0   Glucose, UA NEGATIVE NEGATIVE mg/dL   Hgb urine dipstick TRACE (A) NEGATIVE   Bilirubin Urine NEGATIVE NEGATIVE   Ketones, ur NEGATIVE NEGATIVE mg/dL   Protein, ur NEGATIVE NEGATIVE mg/dL   Nitrite NEGATIVE NEGATIVE   Leukocytes,Ua NEGATIVE NEGATIVE   RBC / HPF 0-5 0 - 5 RBC/hpf   WBC, UA 0-5 0 - 5 WBC/hpf   Bacteria, UA NONE SEEN NONE SEEN   Squamous Epithelial / LPF 0-5 0 - 5   Mucus PRESENT     Comment: Performed at [facility], [place] [address] [place],  [address]  Rapid urine drug screen (hospital performed)     Status: Abnormal   Collection Time: 07/22/21  3:02 AM  Result Value Ref Range   Opiates POSITIVE (A) NONE DETECTED   Cocaine NONE DETECTED NONE DETECTED   Benzodiazepines NONE DETECTED NONE DETECTED   Amphetamines NONE DETECTED NONE DETECTED   Tetrahydrocannabinol POSITIVE (A) NONE DETECTED   Barbiturates NONE DETECTED NONE DETECTED    Comment: (NOTE) DRUG SCREEN FOR MEDICAL PURPOSES ONLY.  IF CONFIRMATION IS NEEDED FOR ANY PURPOSE, NOTIFY LAB WITHIN 5 DAYS.  LOWEST DETECTABLE LIMITS FOR URINE DRUG SCREEN Drug Class                     Cutoff (ng/mL) Amphetamine and metabolites    1000 Barbiturate and metabolites    200 Benzodiazepine  824 Tricyclics and metabolites     300 Opiates and metabolites        300 Cocaine and metabolites        300 THC                            50 Performed at [facility], [place] [address], [place], [place] [address]   Resp Panel by RT-PCR (Flu A&B, Covid) Nasopharyngeal Swab     Status: None   Collection Time: 07/22/21  3:53 AM   Specimen: Nasopharyngeal Swab; Nasopharyngeal(NP) swabs in vial transport medium  Result Value Ref Range   SARS Coronavirus 2 by RT PCR NEGATIVE NEGATIVE    Comment: (NOTE) SARS-CoV-2 target  nucleic acids are NOT DETECTED.  The SARS-CoV-2 RNA is generally detectable in upper respiratory specimens during the acute phase of infection. The lowest concentration of SARS-CoV-2 viral copies this assay can detect is 138 copies/mL. A negative result does not preclude SARS-Cov-2 infection and should not be used as the sole basis for treatment or other patient management decisions. A negative result may occur with  improper specimen collection/handling, submission of specimen other than nasopharyngeal swab, presence of viral mutation(s) within the areas targeted by this assay, and inadequate number of viral copies(<138 copies/mL). A negative result must be combined with clinical observations, patient history, and epidemiological information. The expected result is Negative.  Fact Sheet for Patients:  EntrepreneurPulse.com.au  Fact Sheet for Healthcare Providers:  IncredibleEmployment.be  This test is no t yet approved or cleared by the [place] FDA and  has been authorized for detection and/or diagnosis of SARS-CoV-2 by FDA under an Emergency Use Authorization (EUA). This EUA will remain  in effect (meaning this test can be used) for the duration of the COVID-19 declaration under Section 564(b)(1) of the Act, 21 U.S.C.section 360bbb-3(b)(1), unless the authorization is terminated  or revoked sooner.       Influenza A by PCR NEGATIVE NEGATIVE   Influenza B by PCR NEGATIVE NEGATIVE    Comment: (NOTE) The Xpert Xpress SARS-CoV-2/FLU/RSV plus assay is intended as an aid in the diagnosis of influenza from Nasopharyngeal swab specimens and should not be used as a sole basis for treatment. Nasal washings and aspirates are unacceptable for Xpert Xpress SARS-CoV-2/FLU/RSV testing.  Fact Sheet for Patients: EntrepreneurPulse.com.au  Fact Sheet for Healthcare Providers: IncredibleEmployment.be  This test  is not yet approved or cleared by the [place] FDA and has been authorized for detection and/or diagnosis of SARS-CoV-2 by FDA under an Emergency Use Authorization (EUA). This EUA will remain in effect (meaning this test can be used) for the duration of the COVID-19 declaration under Section 564(b)(1) of the Act, 21 U.S.C. section 360bbb-3(b)(1), unless the authorization is terminated or revoked.  Performed at [facility], [place] [address], [place], [place] [address]     Current Facility-Administered Medications  Medication Dose Route Frequency Provider Last Rate Last Admin   diphenhydrAMINE (BENADRYL) capsule 25 mg  25 mg Oral Q6H PRN Dorie Rank, MD       ezetimibe (ZETIA) tablet 10 mg  10 mg Oral Pete Glatter, MD       And   simvastatin (ZOCOR) tablet 10 mg  10 mg Oral Pete Glatter, MD       gabapentin (NEURONTIN) capsule 100 mg  100 mg Oral TID PRN Dorie Rank, MD       HYDROcodone-acetaminophen (NORCO/VICODIN) 5-325 MG  per tablet 1 tablet  1 tablet Oral Q6H PRN Dorie Rank, MD       OLANZapine zydis (ZYPREXA) disintegrating tablet 10 mg  10 mg Oral Q8H PRN Leevy-Johnson, Tremont Gavitt A, NP       And   LORazepam (ATIVAN) tablet 1 mg  1 mg Oral PRN Leevy-Johnson, Sarahlynn Cisnero A, NP       melatonin tablet 5 mg  5 mg Oral QHS PRN Dorie Rank, MD       metoprolol tartrate (LOPRESSOR) tablet 12.5 mg  12.5 mg Oral BID Dorie Rank, MD   12.5 mg at 07/23/21 1031   mometasone-formoterol (DULERA) 200-5 MCG/ACT inhaler 2 puff  2 puff Inhalation BID Dorie Rank, MD   2 puff at 07/23/21 0952   OLANZapine zydis (ZYPREXA) disintegrating tablet 10 mg  10 mg Oral Daily Leevy-Johnson, Tacoya Altizer A, NP   10 mg at 07/23/21 1333   pantoprazole (PROTONIX) EC tablet 40 mg  40 mg Oral Daily Dorie Rank, MD   40 mg at 07/23/21 0951   [START ON 07/24/2021] predniSONE (DELTASONE) tablet 10 mg  10 mg Oral Q breakfast Leevy-Johnson, Mikaiya Tramble A, NP       [START ON 07/25/2021] predniSONE (DELTASONE) tablet 5 mg   5 mg Oral Q breakfast Leevy-Johnson, Sarye Kath A, NP       promethazine (PHENERGAN) tablet 25 mg  25 mg Oral Q6H PRN Dorie Rank, MD       topiramate (TOPAMAX) tablet 25 mg  25 mg Oral BID Dorie Rank, MD   25 mg at 07/23/21 1346   Current Outpatient Medications  Medication Sig Dispense Refill   aspirin 325 MG tablet Take 325 mg by mouth daily.     budesonide-formoterol (SYMBICORT) 160-4.5 MCG/ACT inhaler Inhale 2 puffs into the lungs 2 (two) times daily.     diphenhydrAMINE (BENADRYL) 25 mg capsule Take 25 mg by mouth every 6 (six) hours as needed for allergies or itching.     ezetimibe-simvastatin (VYTORIN) 10-10 MG tablet Take 1 tablet by mouth at bedtime.     gabapentin (NEURONTIN) 100 MG capsule Take 100 mg by mouth 3 (three) times daily as needed (pain).     HYDROcodone-acetaminophen (NORCO/VICODIN) 5-325 MG tablet Take 1 tablet by mouth every 6 (six) hours as needed for moderate pain.     Loperamide HCl (IMODIUM PO) Take 1-2 tablets by mouth 2 (two) times daily as needed (diarrhea).     MELATONIN PO Take 1 tablet by mouth at bedtime as needed (sleep).     metoprolol tartrate (LOPRESSOR) 25 MG tablet Take 12.5 mg by mouth 2 (two) times daily.     pantoprazole (PROTONIX) 40 MG tablet Take 1 tablet (40 mg total) by mouth daily. 30 tablet 11   predniSONE (DELTASONE) 20 MG tablet Take one tablet (20mg ) by mouth daily for 4 weeks then take 1/2 tablet (10mg ) daily (Patient taking differently: Take 10-20 mg by mouth daily. Take one tablet (20mg ) by mouth daily for 4 weeks then take 1/2 tablet (10mg ) daily) 30 tablet 2   promethazine (PHENERGAN) 25 MG suppository INSERT 1 SUPPOSITORY INTO RECTUM EVERY 8 HOURS AS NEEDED FOR NAUSEA OR VOMITING (Patient taking differently: Place 25 mg rectally every 8 (eight) hours as needed for nausea or vomiting.) 30 suppository 0   promethazine (PHENERGAN) 25 MG tablet Take 1 tablet (25 mg total) by mouth every 6 (six) hours as needed for nausea or vomiting. 30 tablet 0    topiramate (TOPAMAX) 25 MG tablet Take 1 tablet (  25 mg total) by mouth 2 (two) times daily. (Patient not taking: Reported on 07/23/2021) 90 tablet 1    Musculoskeletal: Strength & Muscle Tone: increased Gait & Station: normal Patient leans: N/A  Psychiatric Specialty Exam:  Presentation  General Appearance: Disheveled  Eye Contact:Fleeting  Speech:Pressured  Speech Volume:Increased  Handedness: No data recorded  Mood and Affect  Mood:Labile; Irritable  Affect:Inappropriate; Labile   Thought Process  Thought Processes:Disorganized  Descriptions of Associations:Tangential  Orientation:Partial  Thought Content:Tangential; Perseveration; Paranoid Ideation  History of Schizophrenia/Schizoaffective disorder:Yes  Duration of Psychotic Symptoms:Greater than six months  Hallucinations:Hallucinations: None  Ideas of Reference:Paranoia; Percusatory; Delusions  Suicidal Thoughts:Suicidal Thoughts: No  Homicidal Thoughts:Homicidal Thoughts: No   Sensorium  Memory:Immediate Fair; Recent Fair; Remote Fair  Judgment:Impaired  Insight:Poor; Lacking   Executive Functions  Concentration:Poor  Attention Span:Poor  Recall:Poor  Fund of Knowledge:Fair  Language:Fair   Psychomotor Activity  Psychomotor Activity:Psychomotor Activity: Increased; Restlessness   Assets  Assets:Resilience; Financial Resources/Insurance; Housing   Sleep  Sleep:Sleep: Poor   Physical Exam: Physical Exam Vitals and nursing note reviewed.  Constitutional:      General: She is not in acute distress.    Appearance: Normal appearance. She is normal weight. She is not ill-appearing or toxic-appearing.  HENT:     Head: Normocephalic.     Nose: Nose normal.     Mouth/Throat:     Mouth: Mucous membranes are moist.     Pharynx: Oropharynx is clear.  Eyes:     Pupils: Pupils are equal, round, and reactive to light.  Cardiovascular:     Rate and Rhythm: Normal rate.   Pulmonary:     Effort: Pulmonary effort is normal.  Musculoskeletal:        General: Normal range of motion.     Cervical back: Normal range of motion.  Skin:    General: Skin is warm and dry.  Neurological:     Mental Status: She is alert.  Psychiatric:        Attention and Perception: She is inattentive.        Mood and Affect: Affect is labile and inappropriate.        Speech: Speech is rapid and pressured and tangential.        Behavior: Behavior is agitated and hyperactive.        Thought Content: Thought content is paranoid and delusional. Thought content does not include homicidal or suicidal ideation. Thought content does not include homicidal or suicidal plan.        Cognition and Memory: Cognition is impaired.        Judgment: Judgment is impulsive and inappropriate.   Review of Systems  Constitutional:  Negative for weight loss.  Psychiatric/Behavioral:  Positive for substance abuse.   All other systems reviewed and are negative. Blood pressure (!) 155/97, pulse 92, temperature 97.8 F (36.6 C), temperature source Oral, resp. rate 18, height 5\' 5"  (1.651 m), weight 49.9 kg, SpO2 98 %. Body mass index is 18.3 kg/m.  Treatment Plan Summary: Daily contact with patient to assess and evaluate symptoms and progress in treatment, Medication management, and Plan manage medications and continue to seek inpatient psychiatric hospitalization. Prednisone taper started due to past psychiatric history of bipolar and possibility of steroid induced mania.   Disposition: Recommend psychiatric Inpatient admission when medically cleared. Supportive therapy provided about ongoing stressors. Discussed crisis plan, support from social network, calling 911, coming to the Emergency Department, and calling Suicide Hotline.  Inda Merlin, NP  07/23/2021 3:12 PM

## 2021-07-23 NOTE — ED Notes (Signed)
Pt very agitated, gave prn medications per mar

## 2021-07-23 NOTE — ED Notes (Signed)
Patient is still coming to the door requesting additional medication to sleep. The goes back in room and slams door. RN explained to patient that there was not anything else to give her at this time.

## 2021-07-23 NOTE — ED Notes (Signed)
Spoke to pt for over 30 mins and let her vent about all her concerns so she could try to rest. Pt assured all her concerns would be passed on to day shift as they have more resources during the day. Pt finally laid down to rest.

## 2021-07-24 MED ORDER — ZIPRASIDONE MESYLATE 20 MG IM SOLR
20.0000 mg | INTRAMUSCULAR | Status: AC | PRN
  Administered 2021-07-24: 20 mg via INTRAMUSCULAR
  Filled 2021-07-24: qty 20

## 2021-07-24 MED ORDER — LIP MEDEX EX OINT
TOPICAL_OINTMENT | CUTANEOUS | Status: DC | PRN
  Administered 2021-07-24: 75 via TOPICAL
  Filled 2021-07-24: qty 7

## 2021-07-24 MED ORDER — RISPERIDONE 0.5 MG PO TBDP
2.0000 mg | ORAL_TABLET | Freq: Three times a day (TID) | ORAL | Status: DC | PRN
  Administered 2021-07-24 – 2021-07-25 (×2): 2 mg via ORAL
  Filled 2021-07-24 (×2): qty 4

## 2021-07-24 MED ORDER — LORAZEPAM 1 MG PO TABS
1.0000 mg | ORAL_TABLET | ORAL | Status: AC | PRN
  Administered 2021-07-24: 1 mg via ORAL
  Filled 2021-07-24: qty 1

## 2021-07-24 NOTE — ED Notes (Signed)
Pt becoming more agitated, meds given per mar

## 2021-07-24 NOTE — Progress Notes (Signed)
CSW spoke with Dorian Pod, RN who states she has contacted the [facility] for transportation.  Madilyn Fireman, MSW, LCSW Transitions of Care  Clinical Social Worker II [phone]

## 2021-07-24 NOTE — ED Notes (Signed)
Pt has made her 3 phone calls for the day.
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
## 2021-07-24 NOTE — ED Notes (Signed)
Spoke with Diane RN at the facility and made her aware that pt can not be transferred until tomorrow due to transportation schedule. Diane stated that we can call report tomorrow when transportation is here and they will note her file so that bed will not be given up.

## 2021-07-24 NOTE — ED Notes (Signed)
Pt continues to ask for any prn meds she can have. States she is having racing thoughts. Pt asked for ativan and Vicodin at the same time. Writer [employer]. Geodon given per mar due to pt behaviors

## 2021-07-24 NOTE — Progress Notes (Signed)
Per Peter Congo, Admissions, pt has been accepted to [facility] unit. Accepting provider is Dr. Otho Najjar. Patient can arrive anytime. Number for report is [phone]. Please Fax IVC paperwork before calling report to fax number: [phone].    Glennie Isle, MSW, LCSW-A Phone: [phone] Disposition/TOC

## 2021-07-24 NOTE — ED Notes (Signed)
Just received a call from [place]  from transportation stating that it will be tomorrow before anyone can come and get the pt for transport. Charge aware

## 2021-07-24 NOTE — ED Notes (Signed)
Pt coming out of room asking RN to give her what ever she has to give prn. Pt would not state what or why she wanted medications. Pt then specifically asked for Geodon and RN explained that since she has been in the bed sleeping and not agitated that RN would not be able to give the Geodon. Pt then states we are withholding care for her.

## 2021-07-24 NOTE — ED Notes (Signed)
Pt asked for gabapentin for nerve pain. Meds given per [facility]

## 2021-07-24 NOTE — ED Notes (Signed)
Attempted to call report to facility and they refused report until transportation is here to pick up patient.Hulen Skains and left message on the voice mail for Manning Charity (transportation for sheriff) at [phone]

## 2021-07-25 NOTE — ED Provider Notes (Signed)
Emergency Medicine Observation Re-evaluation Note  [name] is a 65 y.o. female, seen on rounds today.  Pt initially presented to the ED for complaints of IVC Currently, the patient is alert and interactive.  Patient updated about transfer to [facility] today.  Patient requests several issues to be addressed.  She made me aware that she has history of heart condition.  She denies any acute complaints regarding this.  She is not having chest pain or shortness of breath.  Patient reports that she also has GI problems and had not had solid food since she has been in the hospital here eating what is brought to her.  She reports now that is causing a lot of gas and she will be having a "dumping syndrome".  She requests that she be allowed to have her husband bring her own diapers which are appropriately sized for her.  Physical Exam  BP 127/89 (BP Location: Right Arm)   Pulse 97   Temp 97.7 F (36.5 C) (Oral)   Resp 16   Ht 5\' 5"  (1.651 m)   Wt 49.9 kg   SpO2 98%   BMI 18.30 kg/m  Physical Exam General: Alert and well in appearance.  No distress. Cardiac: Regular.  2 out of 6 systolic ejection murmur.  Radial pulses strong and 2+. Lungs: Clear to auscultation. Psych: Alert.  Situationally oriented.  No acute distress. musculoskeletal: No peripheral edema calves are soft and nontender  ED Course / MDM  EKG:   I have reviewed the labs performed to date as well as medications administered while in observation.  Recent changes in the last 24 hours include accepted to [facility].  Plan  Current plan is for psychiatric admission.  EKTA DANCER is under involuntary commitment documented 9\22 done by Dr. Wyvonnia Dusky.  Patient is stable for psychiatric admission.  Cardiology notes reviewed.  Patient has had CT coronary study with low risk for cardiac ischemic disease.  She does have basal septal hypertrophy with normal LV function managed with low-dose  beta-blocker.  No indication at this time patient is in congestive heart failure having any acute issues with stable cardiac condition.  Patient's request for her own diapers.  We will have nursing staff notify husband to drop some at the ED to be taken with her with personal belongings if possible.     Charlesetta Shanks, MD 07/25/21 [phone]

## 2021-07-25 NOTE — ED Notes (Signed)
Patient with multiple requests throughout the night concerning PRN medications.  Patient even questioned, "Do I have anything to take for anything at all."

## 2021-07-25 NOTE — ED Notes (Signed)
Patient requesting Neurontin and Risperdal for "anxiety" and "panic attacks."  Patient is not physically displaying increased anxiety or "panic attack."  Medication given as requested.

## 2021-08-16 IMAGING — DX DG CHEST 1V PORT
1 series · 1 of 1 positions shown · non-contrast
Comparison: July 17, 2015

CLINICAL DATA: Chest pain

EXAM:
PORTABLE CHEST 1 VIEW

[chest ap]
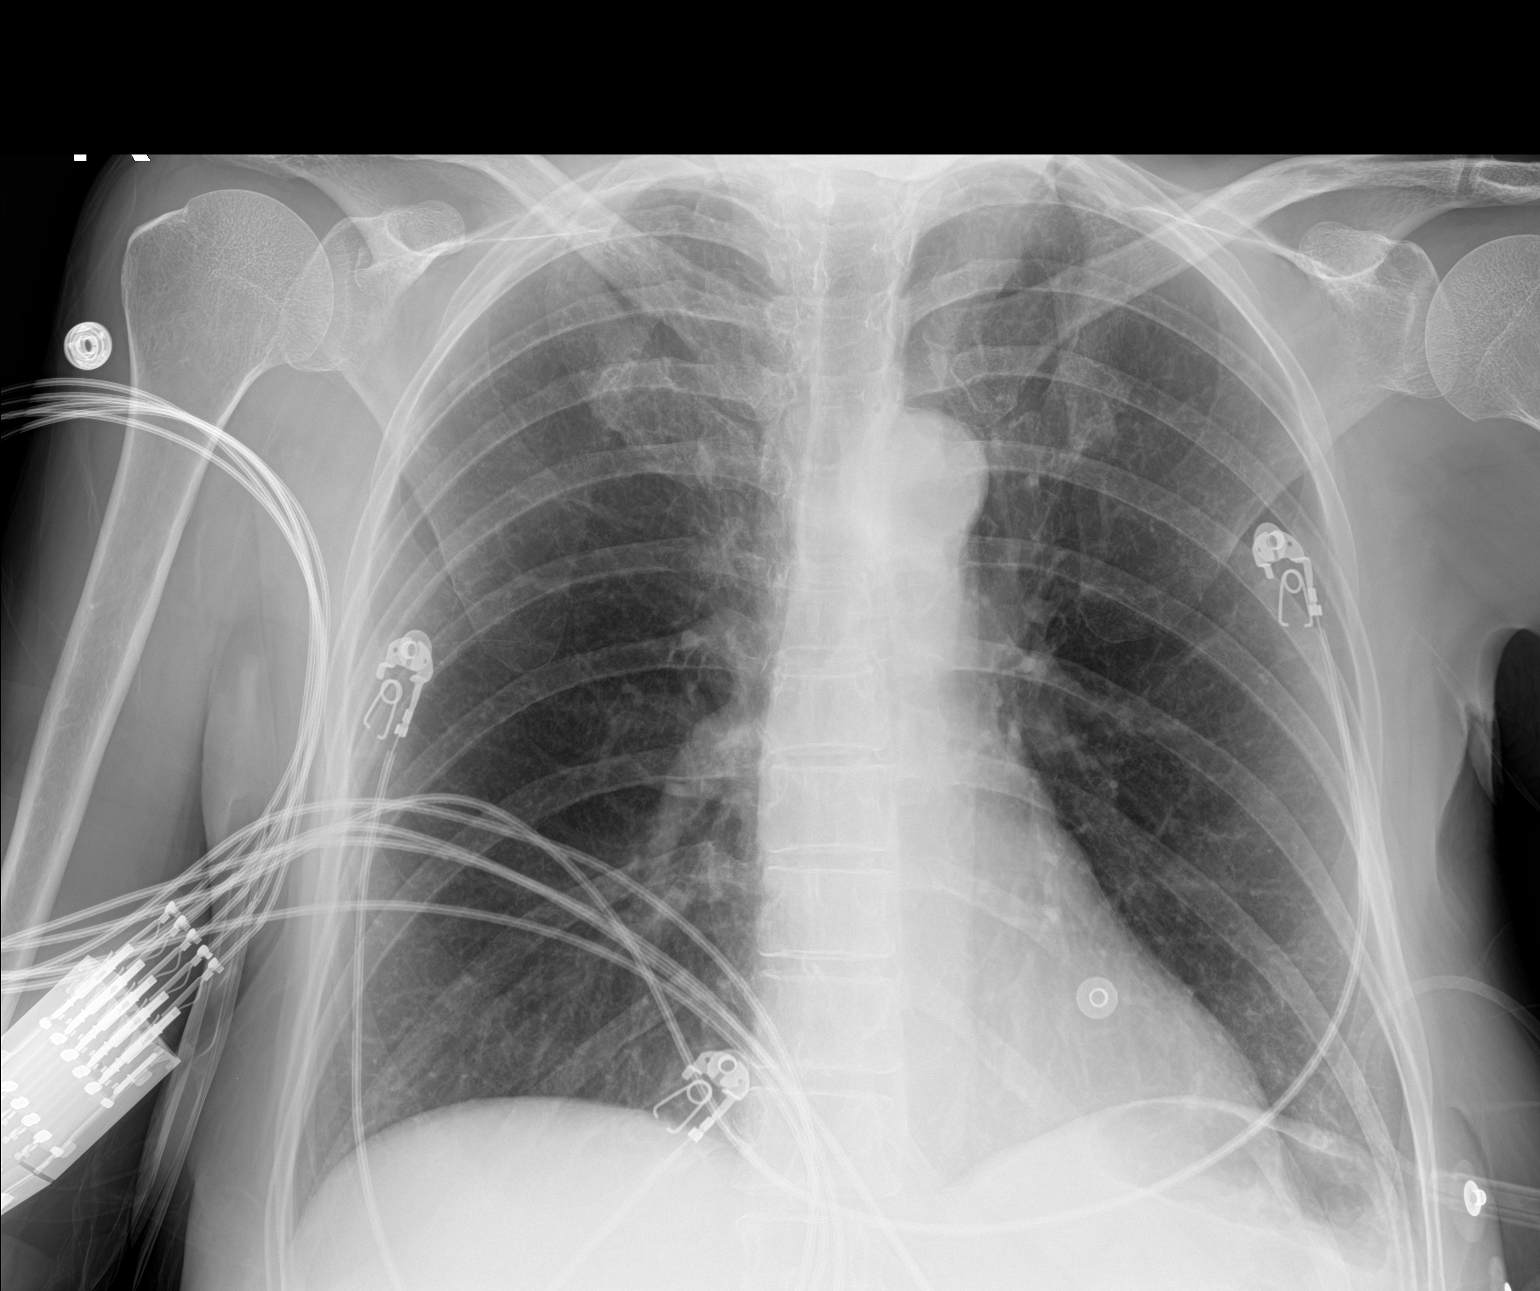

[1 of 1 positions shown; findings below may reference images not displayed]

FINDINGS: Lungs are clear. Heart size and pulmonary vascularity are normal. No
adenopathy. No pneumothorax. No bone lesions.
IMPRESSION: Lungs clear.  Heart size normal.  No adenopathy.

## 2021-08-24 ENCOUNTER — Ambulatory Visit: Payer: Medicare Other | Admitting: Internal Medicine

## 2021-08-24 ENCOUNTER — Encounter: Payer: Self-pay | Admitting: Internal Medicine

## 2021-08-24 VITALS — BP 100/66 | HR 82 | Ht 65.0 in | Wt 114.0 lb

## 2021-08-24 DIAGNOSIS — K219 Gastro-esophageal reflux disease without esophagitis: Secondary | ICD-10-CM

## 2021-08-24 DIAGNOSIS — R1115 Cyclical vomiting syndrome unrelated to migraine: Secondary | ICD-10-CM

## 2021-08-24 DIAGNOSIS — K52832 Lymphocytic colitis: Secondary | ICD-10-CM | POA: Diagnosis not present

## 2021-08-24 DIAGNOSIS — R197 Diarrhea, unspecified: Secondary | ICD-10-CM

## 2021-08-24 DIAGNOSIS — F1210 Cannabis abuse, uncomplicated: Secondary | ICD-10-CM

## 2021-08-24 MED ORDER — PREDNISONE 10 MG PO TABS: 10.0000 mg | ORAL_TABLET | Freq: Every day | ORAL | 0 refills | Status: DC

## 2021-08-24 NOTE — Progress Notes (Signed)
HISTORY OF PRESENT ILLNESS:  [name] is a 65 y.o. female with multiple medical problems including bipolar disorder chronic headaches, prior stroke, marijuana dependence, and a prior history of paraesophageal hernia repair with mesh and gastropexy November 2015, elsewhere.  Patient has been seen in this office for GERD, IBS, cyclic vomiting, and lymphocytic colitis diagnosed on surveillance colonoscopy May 2022.  The patient was last seen in this office July 07, 2021.  She could not afford budesonide.  She was placed on a course of prednisone.  Currently on 10 mg daily.  Since her last visit she was involuntarily committed to a psychiatric ward by her husband and son (according to her) for reports of suicidal and homicidal threat.  Patient tells me that her problems were a result of her not taking her behavioral health medications.  She presents now for GI follow-up as requested.  She is currently on 10 mg of prednisone daily.  She reports improvement in her bowels.  No further having incontinence.  She tells me that some days she will have diarrhea, some days she will not have a bowel movement, and some days she will have constipation.  No new complaints.  She tells me that she is having a meeting with insurance related people tomorrow regarding drug cost affordability.  REVIEW OF SYSTEMS:  All non-GI ROS negative unless otherwise stated in the HPI except for chronic pain, cough.  Past Medical History:  Diagnosis Date   Abdominal pain    chronic, recurrent   Allergy    Bipolar affective disorder (HCC)    Chronic headaches    Chronic pain    Colon polyps    adenomatous   Cyclical vomiting syndrome    Depression    Diverticulosis    Endometriosis    Fibrocystic breast    GERD (gastroesophageal reflux disease)    Hiatal hernia    IBS (irritable bowel syndrome)    Marijuana dependence ([place])    Status post repair of paraesophageal diaphragmatic hernia    Stroke ([place]) 12/2013     Past Surgical History:  Procedure Laterality Date   ABDOMINAL HYSTERECTOMY  1990   BREAST LUMPECTOMY Bilateral    COLONOSCOPY  10/07/2015   Dr.Caffie Sotto   FOOT FRACTURE SURGERY Right    x 3   HIATAL HERNIA REPAIR     NISSEN FUNDOPLICATION  12/7251   hiatal hernia repair   TOE SURGERY Left     Social History [name]  reports that she has never smoked. She has never used smokeless tobacco. She reports current drug use. Drug: Marijuana. She reports that she does not drink alcohol.  family history includes Brain cancer in her mother; Breast cancer in her paternal grandmother and sister; Diabetes in her mother and sister.  Allergies  Allergen Reactions   Dust Mite Extract    Lyrica [Pregabalin]     Cause bladder issues        PHYSICAL EXAMINATION: Vital signs: BP 100/66   Pulse 82   Ht 5\' 5"  (1.651 m)   Wt 114 lb (51.7 kg)   BMI 18.97 kg/m   Constitutional: generally well-appearing, no acute distress Psychiatric: alert and oriented x3, cooperative Eyes: extraocular movements intact, anicteric, conjunctiva pink Mouth: Mask Neck: supple no lymphadenopathy Cardiovascular: heart regular rate and rhythm, no murmur Lungs: clear to auscultation bilaterally Abdomen: soft, nontender, nondistended, no obvious ascites, no peritoneal signs, normal bowel sounds, no organomegaly Rectal: Omitted Extremities: no clubbing, cyanosis, or lower extremity edema bilaterally  Skin: no lesions on visible extremities Neuro: No focal deficits.  Cranial nerves intact  ASSESSMENT:  1.  Lymphocytic colitis.  She has responded to a course of prednisone. 2.  History of adenomatous colon polyps.  Surveillance up-to-date 3.  GERD with a history of paraesophageal hernia repair 4.  Chronic nausea with cyclic vomiting felt secondary to chronic cannabis use 5.  Multiple medical problems   PLAN:  1.  Patient has been instructed to continue low-dose prednisone (10 mg) for 2 weeks, then  stop. 2.  I told her that I would not prescribe prednisone in the future due to her behavioral health issues.  Prednisone is probably not a good medication in somebody with history of psychoses.  She understands. 3.  Told to work with her insurance company regarding the affordability of budesonide, should it be needed.  I have given her a note describing the medication, his dosage, and the reason for it being prescribed. 4.  Resume general medical care with PCP.  GI follow-up as needed A total time of 35 minutes was spent preparing to see the patient, reviewing outside records, and test.  Obtaining comprehensive history and performing medically appropriate physical exam.  Counseling the patient regarding her above listed issues, directing medical therapy, ordering medications as needed, and documenting clinical information in the health record

## 2021-08-24 NOTE — Patient Instructions (Signed)
If you are age 64 or older, your body mass index should be between 23-30. Your Body mass index is 18.97 kg/m. If this is out of the aforementioned range listed, please consider follow up with your Primary Care Provider.  If you are age 59 or younger, your body mass index should be between 19-25. Your Body mass index is 18.97 kg/m. If this is out of the aformentioned range listed, please consider follow up with your Primary Care Provider.   ________________________________________________________  The [place] [facility] providers would like to encourage you to use [facility] to communicate with providers for non-urgent requests or questions.  Due to long hold times on the telephone, sending your provider a message by [facility] may be a faster and more efficient way to get a response.  Please allow 48 business hours for a response.  Please remember that this is for non-urgent requests.  _______________________________________________________  Continue Prednisone 10mg  daily and then stop  Contact your insurance company regarding Budesonide  Please follow up as needed

## 2021-10-05 IMAGING — MR MR CARD MORPHOLOGY WO/W CM
35 of 37 series · 36 of 40 positions shown · IV contrast (gadavist)
Comparison: none

CLINICAL DATA: Evaluate for HCM

EXAM:
CARDIAC MRI
TECHNIQUE: The patient was scanned on a 1.5 Tesla Siemens magnet. A dedicated
cardiac coil was used. Functional imaging was done using Fiesta
sequences. [DATE], and 4 chamber views were done to assess for RWMA's.
Modified Antesar rule using a short axis stack was used to
calculate an ejection fraction on a dedicated work station using
Circle software. The patient received 8 cc of Gadavist. After 10
minutes inversion recovery sequences were used to assess for
infiltration and scar tissue.
CONTRAST:  8 cc  of Gadavist

[Series 6: bSSFP · oblique · 8.0mm · 1.52mm/px · 1 of 25 slices shown (1 of 20)]
[im 1/25]
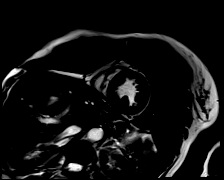

[Series 6: bSSFP · oblique · 8.0mm · 1.52mm/px · 1 of 25 slices shown (2 of 20)]
[im 1/25]
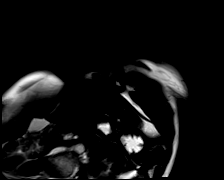

[Series 6: bSSFP · oblique · 8.0mm · 1.52mm/px · 1 of 25 slices shown (3 of 20)]
[im 1/25]
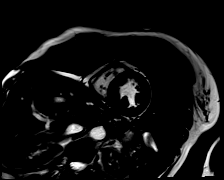

[Series 6: bSSFP · oblique · 8.0mm · 1.52mm/px · 1 of 25 slices shown (4 of 20)]
[im 1/25]
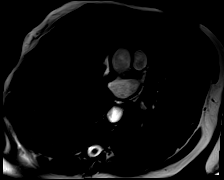

[Series 6: bSSFP · oblique · 8.0mm · 1.52mm/px · 1 of 25 slices shown (5 of 20)]
[im 1/25]
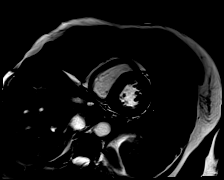

[Series 6: bSSFP · oblique · 8.0mm · 1.52mm/px · 1 of 25 slices shown (6 of 20)]
[im 1/25]
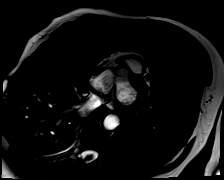

[Series 6: bSSFP · oblique · 8.0mm · 1.52mm/px · 1 of 25 slices shown (7 of 20)]
[im 1/25]
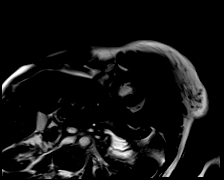

[Series 6: bSSFP · oblique · 8.0mm · 1.52mm/px · 2 of 25 slices shown (8 of 20)]
[im 1/25]
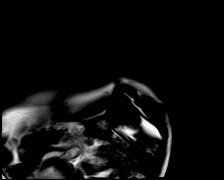
[im 25/25]
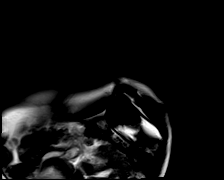

[Series 6: bSSFP · oblique · 8.0mm · 1.52mm/px · 1 of 25 slices shown (9 of 20)]
[im 1/25]
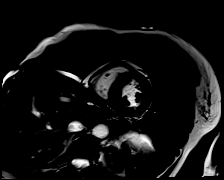

[Series 6: bSSFP · oblique · 8.0mm · 1.52mm/px · 1 of 25 slices shown (10 of 20)]
[im 1/25]
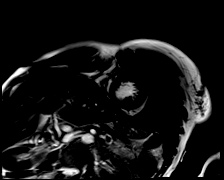

[Series 6: bSSFP · oblique · 8.0mm · 1.52mm/px · 1 of 25 slices shown (11 of 20)]
[im 1/25]
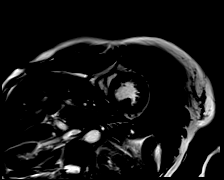

[Series 6: bSSFP · oblique · 8.0mm · 1.52mm/px · 1 of 25 slices shown (12 of 20)]
[im 1/25]
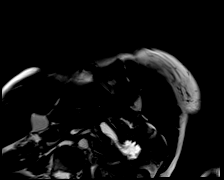

[Series 6: bSSFP · oblique · 8.0mm · 1.52mm/px · 1 of 25 slices shown (13 of 20)]
[im 1/25]
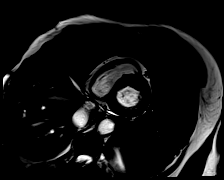

[Series 6: bSSFP · oblique · 8.0mm · 1.52mm/px · 1 of 25 slices shown (14 of 20)]
[im 1/25]
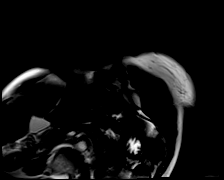

[Series 6: bSSFP · oblique · 8.0mm · 1.52mm/px · 1 of 25 slices shown (15 of 20)]
[im 1/25]
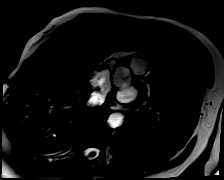

[Series 6: bSSFP · oblique · 8.0mm · 1.52mm/px · 1 of 25 slices shown (16 of 20)]
[im 1/25]
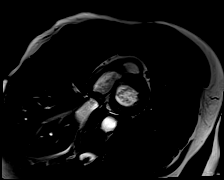

[Series 6: bSSFP · oblique · 8.0mm · 1.52mm/px · 1 of 25 slices shown (17 of 20)]
[im 1/25]
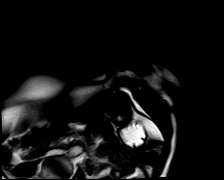

[Series 7: STIR · oblique · 8.0mm · 1.63mm/px · 1 of 17 slices shown]
[im 1/17]
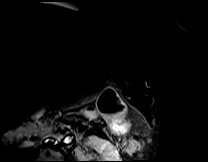

[Series 8: (id)_long_t1 · oblique · 8.0mm · 1.25mm/px · 1 of 24 slices shown]
[im 1/24]
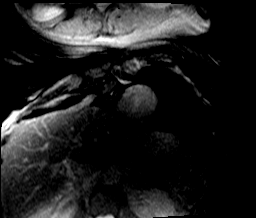

[Series 9: (id)_long_t1_moco · oblique · 8.0mm · 1.25mm/px · 1 of 24 slices shown]
[im 1/24]
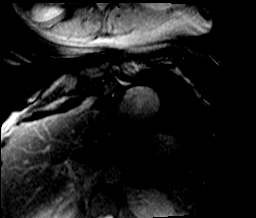

[Series 10: (id)_long_t1_moco_t1 · oblique · 8.0mm · 1.25mm/px · 1 of 6 slices shown]
[im 1/6]
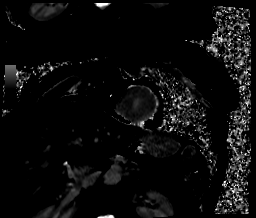

[Series 12: (id)_trufi · oblique · 8.0mm · 1.88mm/px · 1 of 9 slices shown]
[im 1/9]
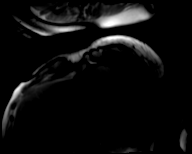

[Series 13: (id)_trufi_moco · oblique · 8.0mm · 1.88mm/px · 1 of 9 slices shown]
[im 1/9]
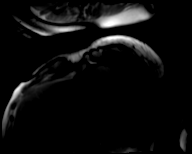

[Series 14: (id)_trufi_moco_t2 · oblique · 8.0mm · 1.88mm/px · 1 of 4 slices shown]
[im 1/4]
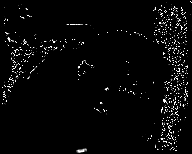

[Series 16: t2_stir_db_radial ((date)ch) · oblique · 6.0mm · 1.63mm/px · 1 of 3 slices shown]
[im 1/3]
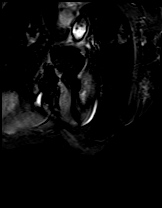

[Series 17: bSSFP · oblique · 6.0mm · 1.41mm/px · 1 of 25 slices shown (18 of 20)]
[im 1/25]
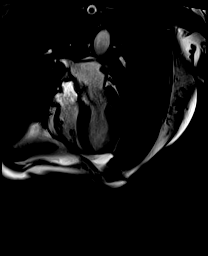

[Series 18: bSSFP · sagittal · 6.0mm · 1.41mm/px · 1 of 25 slices shown (19 of 20)]
[im 1/25]
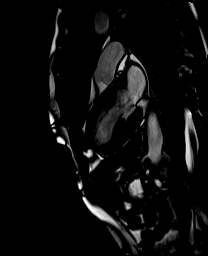

[Series 19: bSSFP · oblique · 6.0mm · 1.41mm/px · 1 of 25 slices shown (20 of 20)]
[im 1/25]
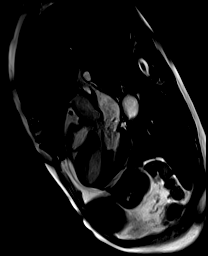

[Series 21: lge_single shot sa · oblique · 8.0mm · 1.77mm/px · 1 of 17 slices shown (1 of 2)]
[im 1/17]
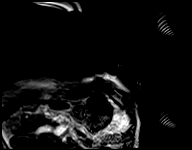

[Series 22: lge_single shot sa · oblique · 8.0mm · 1.77mm/px · 1 of 17 slices shown (2 of 2)]
[im 1/17]
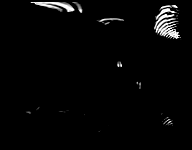

[Series 29: (id)_short_t1 · oblique · 8.0mm · 1.33mm/px · 1 of 27 slices shown]
[im 1/27]
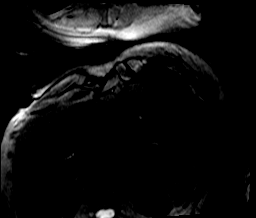

[Series 30: (id)_short_t1_moco · oblique · 8.0mm · 1.33mm/px · 1 of 27 slices shown]
[im 1/27]
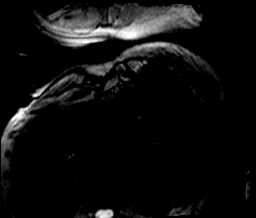

[Series 31: (id)_short_t1_moco_t1 · oblique · 8.0mm · 1.33mm/px · 1 of 6 slices shown]
[im 1/6]
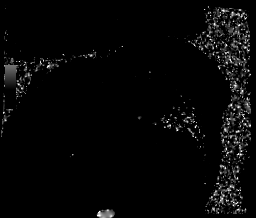

[Series 34: lge short axis_mag · oblique · 8.0mm · 1.61mm/px · 1 of 17 slices shown]
[im 1/17]
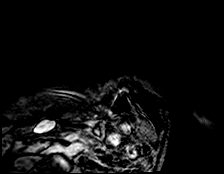

[Series 35: lge short axis_psir · oblique · 8.0mm · 1.61mm/px · 1 of 17 slices shown]
[im 1/17]
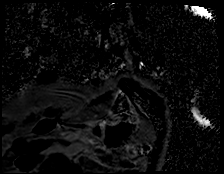

[36 of 40 positions shown; findings below may reference images not displayed]

FINDINGS: Left ventricle:

-Normal LV size

-Hyperdynamic systolic function

-Asymmetric hypertrophy measuring up to 17mm in basal inferoseptum
(9 mm in posterior wall)

-LVOT obstruction from FESHANA of the anterior leaflet of the mitral
valve. LVOT gradient was not measured

-Elevated ECV (32%) in basal segments, normal ECV (25%) in mid
segments

-Inferior RV insertion site LGE. LGE accounts for <1% total
myocardial mass

LV EF:  73% (Normal 56-78%)

Absolute volumes:

LV EDV: 96mL (Normal 52-141 mL)

LV ESV: 25mL (Normal 13-51 mL)

LV SV: 70mL (Normal 33-97 mL)

CO: 4.8L/min (Normal 2.7-6.0 L/min)

Indexed volumes:

LV EDV: 58mL/sq-m (Normal 41-81 mL/sq-m)

LV ESV: 15mL/sq-m (Normal 12-21 mL/sq-m)

LV SV: 43mL/sq-m (Normal 26-56 mL/sq-m)

CI: 2.9L/min/sq-m (Normal 1.8-3.8 L/min/sq-m)

Right ventricle: Normal size and systolic function

RV EF: 73% (Normal 47-80%)

Absolute volumes:

RV EDV: 73mL (Normal 58-154 mL)

RV ESV: 20mL (Normal 12-68 mL)

RV SV: 53mL (Normal 35-98 mL)

CO: 3.6L/min (Normal 2.7-6 L/min)

Indexed volumes:

RV EDV: 44mL/sq-m (Normal 48-87 mL/sq-m)

RV ESV: 12mL/sq-m (Normal 11-28 mL/sq-m)

RV SV: 32mL/sq-m (Normal 27-57 mL/sq-m)

CI: 2.2L/min/sq-m (Normal 1.8-3.8 L/min/sq-m)

Left atrium: Mild enlargement

Right atrium: Normal size

Mitral valve: Mild regurgitation

Aortic valve: No regurgitation

Tricuspid valve: No regurgitation

Pericardium: Normal
IMPRESSION: 1. Asymmetric hypertrophy measuring up to 17mm in basal inferoseptum
(9 mm in posterior wall), consistent with hypertrophic
cardiomyopathy

2. Inferior RV insertion site late gadolinium enhancement, which is
commonly seen in HCM. LGE accounts for <1% of total myocardial mass

3. LVOT obstruction due to systolic anterior motion of the anterior
mitral valve leaflet. LVOT gradient was not measured

4.  Normal LV size with hyperdynamic systolic function (EF 73%)

5.  Normal RV size and systolic function

## 2021-10-22 ENCOUNTER — Emergency Department (HOSPITAL_COMMUNITY)
Admission: EM | Admit: 2021-10-22 | Discharge: 2021-10-22 | Disposition: A | Discharge status: Home / Self Care | Payer: Medicare Other | Attending: Emergency Medicine | Admitting: Emergency Medicine

## 2021-10-22 ENCOUNTER — Other Ambulatory Visit: Payer: Self-pay

## 2021-10-22 DIAGNOSIS — Z79899 Other long term (current) drug therapy: Secondary | ICD-10-CM | POA: Insufficient documentation

## 2021-10-22 DIAGNOSIS — Z7982 Long term (current) use of aspirin: Secondary | ICD-10-CM | POA: Diagnosis not present

## 2021-10-22 DIAGNOSIS — S61452A Open bite of left hand, initial encounter: Secondary | ICD-10-CM | POA: Insufficient documentation

## 2021-10-22 DIAGNOSIS — W540XXA Bitten by dog, initial encounter: Secondary | ICD-10-CM | POA: Diagnosis not present

## 2021-10-22 DIAGNOSIS — Z86010 Personal history of colonic polyps: Secondary | ICD-10-CM | POA: Diagnosis not present

## 2021-10-22 MED ORDER — AMOXICILLIN-POT CLAVULANATE 875-125 MG PO TABS: 1.0000 | ORAL_TABLET | Freq: Two times a day (BID) | ORAL | 0 refills | Status: AC

## 2021-10-22 NOTE — ED Triage Notes (Signed)
Pt. UTD on shots

## 2021-10-22 NOTE — Discharge Instructions (Signed)
Get help right away if: You have a red streak that leads away from your wound. You have non-clear fluid or more blood coming from your wound. There is pus or a bad smell coming from your wound. You have trouble moving your injured area. You have numbness or tingling that extends beyond the wound.

## 2021-10-22 NOTE — ED Triage Notes (Signed)
Pt. Stated, I was playing ball with my dog and he caught my finger just right with his tooth, he really felt bad.tooth puncture between little and ring finger of left hand.

## 2021-10-22 NOTE — ED Notes (Signed)
Wound left 5 digit cleaned and dressed.

## 2021-10-22 NOTE — ED Provider Notes (Signed)
[facility] Provider Note   CSN: [id number] Arrival date & time: 10/22/21  8338     History Chief Complaint  Patient presents with   Animal Bite   Finger Injury    [name] is a 65 y.o. female who presents for relation of puncture wound to the left hand.  Patient was playing with her dog when the dog's tooth caught her in between the webbing of her fourth and fifth digit on the left hand.  She states that her dog is up-to-date on his vaccinations.  She is up-to-date on her tetanus shot.  She has no other acute abnormalities including numbness or tingling   Animal Bite     Past Medical History:  Diagnosis Date   Abdominal pain    chronic, recurrent   Allergy    Bipolar affective disorder (HCC)    Chronic headaches    Chronic pain    Colon polyps    adenomatous   Cyclical vomiting syndrome    Depression    Diverticulosis    Endometriosis    Fibrocystic breast    GERD (gastroesophageal reflux disease)    Hiatal hernia    IBS (irritable bowel syndrome)    Marijuana dependence ([place])    Status post repair of paraesophageal diaphragmatic hernia    Stroke ([place]) 12/2013    Patient Active Problem List   Diagnosis Date Noted   Chest pain 01/07/2020   Chronic pain 01/07/2020   Hypokalemia    LVH (left ventricular hypertrophy)    Hyperlipidemia 12/03/2019   Atypical chest pain 12/03/2019   Nonspecific abnormal electrocardiogram (ECG) (EKG) 25/03/3975   Cyclical vomiting with nausea 73/41/9379   Cyclic vomiting syndrome 04/03/2014   CVA (cerebral infarction) 01/29/2014   Polysubstance abuse ([place]) 01/29/2014   Intractable nausea and vomiting 11/16/2013   N&V (nausea and vomiting) 02/03/2013   Bipolar affective disorder ([place])    GERD (gastroesophageal reflux disease)    COCAINE ABUSE, 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     Family History  Problem Relation Age of Onset   Brain cancer Mother    Diabetes Mother    Breast cancer Sister    Diabetes Sister        x 2   Breast cancer Paternal Grandmother    Colon cancer Neg Hx    Esophageal cancer Neg Hx    Stomach cancer Neg Hx    Rectal cancer Neg Hx     Social History   Tobacco Use   Smoking status: Never   Smokeless tobacco: Never  Vaping Use   Vaping Use: Never used  Substance Use Topics   Alcohol use: No    Alcohol/week: 0.0 standard drinks   Drug use: Yes    Types: Marijuana    Comment: smokes daily     Home Medications Prior to Admission medications   Medication Sig Start Date End  Date Taking? Authorizing Provider  aspirin 325 MG tablet Take 325 mg by mouth daily.    [provider]  budesonide-formoterol (SYMBICORT) 160-4.5 MCG/ACT inhaler Inhale 2 puffs into the lungs 2 (two) times daily.    [provider]  diphenhydrAMINE (BENADRYL) 25 mg capsule Take 25 mg by mouth every 6 (six) hours as needed for allergies or itching.    [provider]  ezetimibe-simvastatin (VYTORIN) 10-10 MG tablet Take 1 tablet by mouth at bedtime.    [provider]  gabapentin (NEURONTIN) 100 MG capsule Take 100 mg by mouth 3 (three) times daily as needed (pain).    [provider]  HYDROcodone-acetaminophen (NORCO/VICODIN) 5-325 MG tablet Take 1 tablet by mouth every 6 (six) hours as needed for moderate pain. 11/29/19   [provider]  Loperamide HCl (IMODIUM PO) Take 1-2 tablets by mouth 2 (two) times daily as needed  (diarrhea).    [provider]  MELATONIN PO Take 1 tablet by mouth at bedtime as needed (sleep).    [provider]  metoprolol tartrate (LOPRESSOR) 25 MG tablet Take 12.5 mg by mouth 2 (two) times daily.    [provider]  pantoprazole (PROTONIX) 40 MG tablet Take 1 tablet (40 mg total) by mouth daily. 12/14/15   Irene Shipper, MD  predniSONE (DELTASONE) 10 MG tablet Take 1 tablet (10 mg total) by mouth daily with breakfast. 08/24/21   Irene Shipper, MD  predniSONE (DELTASONE) 20 MG tablet Take one tablet (20mg ) by mouth daily for 4 weeks then take 1/2 tablet (10mg ) daily Patient taking differently: Take 10-20 mg by mouth daily. Take one tablet (20mg ) by mouth daily for 4 weeks then take 1/2 tablet (10mg ) daily 07/07/21   Irene Shipper, MD  promethazine (PHENERGAN) 25 MG suppository INSERT 1 SUPPOSITORY INTO RECTUM EVERY 8 HOURS AS NEEDED FOR NAUSEA OR VOMITING Patient taking differently: Place 25 mg rectally every 8 (eight) hours as needed for nausea or vomiting. 11/21/20   Mesner, Corene Cornea, MD  promethazine (PHENERGAN) 25 MG tablet Take 1 tablet (25 mg total) by mouth every 6 (six) hours as needed for nausea or vomiting. 05/27/21   Irene Shipper, MD  topiramate (TOPAMAX) 25 MG tablet Take 1 tablet (25 mg total) by mouth 2 (two) times daily. 02/25/21   Garvin Fila, MD    Allergies    Dust mite extract and Lyrica [pregabalin]  Review of Systems   Review of Systems Ten systems reviewed and are negative for acute change, except as noted in the HPI.   Physical Exam Updated Vital Signs BP 135/68 (BP Location: Left Arm)    Pulse 63    Temp 98.9 F (37.2 C) (Oral)    Resp 16    SpO2 99%   Physical Exam Vitals and nursing note reviewed.  Constitutional:      General: She is not in acute distress.    Appearance: She is well-developed. She is not diaphoretic.  HENT:     Head: Normocephalic and atraumatic.     Right Ear: External ear normal.     Left Ear: External ear  normal.     Nose: Nose normal.     Mouth/Throat:     Mouth: Mucous membranes are moist.  Eyes:     General: No scleral icterus.    Conjunctiva/sclera: Conjunctivae normal.  Cardiovascular:     Rate and Rhythm: Normal rate and regular rhythm.     Heart sounds: Normal heart sounds.  No murmur heard.   No friction rub. No gallop.  Pulmonary:     Effort: Pulmonary effort is normal. No respiratory distress.     Breath sounds: Normal breath sounds.  Abdominal:     General: Bowel sounds are normal. There is no distension.     Palpations: Abdomen is soft. There is no mass.     Tenderness: There is no abdominal tenderness. There is no guarding.  Musculoskeletal:     Cervical back: Normal range of motion.     Comments: Full ROM of the finger, normal cap refill, normal strength. No active bleeding.  Skin:    General: Skin is warm and dry.  Neurological:     Mental Status: She is alert and oriented to person, place, and time.  Psychiatric:        Behavior: Behavior normal.    ED Results / Procedures / Treatments   Labs (all labs ordered are listed, but only abnormal results are displayed) Labs Reviewed - No data to display  EKG None  Radiology No results found.  Procedures Procedures   Medications Ordered in ED Medications - No data to display  ED Course  I have reviewed the triage vital signs and the nursing notes.  Pertinent labs & imaging results that were available during my care of the patient were reviewed by me and considered in my medical decision making (see chart for details).    MDM Rules/Calculators/A&P                         65 year old female here with dog tooth puncture wound to the hand.  Wound clean.  Patient be discharged on Augmentin, neurovascularly intact.  Final Clinical Impression(s) / ED Diagnoses Final diagnoses:  None    Rx / DC Orders ED Discharge Orders     None        Margarita Mail, PA-C 10/22/21 1617    Regan Lemming,  MD 10/22/21 [phone]

## 2021-11-25 ENCOUNTER — Other Ambulatory Visit: Payer: Self-pay | Admitting: Cardiovascular Disease

## 2021-12-01 DEATH — deceased
